# Patient Record
Sex: Male | Born: 1950
Health system: Southern US, Community
[De-identification: ages and names within clinical notes are randomized; demographics above are authoritative.]

## PROBLEM LIST (undated history)

## (undated) DIAGNOSIS — Z972 Presence of dental prosthetic device (complete) (partial): Secondary | ICD-10-CM

## (undated) DIAGNOSIS — I493 Ventricular premature depolarization: Secondary | ICD-10-CM

## (undated) DIAGNOSIS — A549 Gonococcal infection, unspecified: Secondary | ICD-10-CM

## (undated) DIAGNOSIS — R0609 Other forms of dyspnea: Secondary | ICD-10-CM

## (undated) DIAGNOSIS — K4090 Unilateral inguinal hernia, without obstruction or gangrene, not specified as recurrent: Secondary | ICD-10-CM

## (undated) DIAGNOSIS — J189 Pneumonia, unspecified organism: Secondary | ICD-10-CM

## (undated) DIAGNOSIS — I77810 Thoracic aortic ectasia: Secondary | ICD-10-CM

## (undated) DIAGNOSIS — R0600 Dyspnea, unspecified: Secondary | ICD-10-CM

## (undated) DIAGNOSIS — H409 Unspecified glaucoma: Secondary | ICD-10-CM

## (undated) HISTORY — PX: HIATAL HERNIA REPAIR: SHX195

## (undated) HISTORY — DX: Presence of dental prosthetic device (complete) (partial): Z97.2

## (undated) HISTORY — DX: Ventricular premature depolarization: I49.3

## (undated) HISTORY — DX: Unspecified glaucoma: H40.9

## (undated) HISTORY — DX: Other forms of dyspnea: R06.09

## (undated) HISTORY — DX: Unilateral inguinal hernia, without obstruction or gangrene, not specified as recurrent: K40.90

## (undated) HISTORY — DX: Pneumonia, unspecified organism: J18.9

## (undated) HISTORY — DX: Dyspnea, unspecified: R06.00

## (undated) HISTORY — DX: Gonococcal infection, unspecified: A54.9

---
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
## 1999-01-20 ENCOUNTER — Encounter: Payer: Self-pay | Admitting: Emergency Medicine

## 1999-01-20 ENCOUNTER — Emergency Department (HOSPITAL_COMMUNITY): Admission: EM | Admit: 1999-01-20 | Discharge: 1999-01-20 | Payer: Self-pay | Admitting: Emergency Medicine

## 2012-11-14 ENCOUNTER — Ambulatory Visit (INDEPENDENT_AMBULATORY_CARE_PROVIDER_SITE_OTHER): Payer: BC Managed Care – PPO | Admitting: General Surgery

## 2012-11-23 ENCOUNTER — Encounter (INDEPENDENT_AMBULATORY_CARE_PROVIDER_SITE_OTHER): Payer: Self-pay

## 2012-11-24 ENCOUNTER — Encounter (INDEPENDENT_AMBULATORY_CARE_PROVIDER_SITE_OTHER): Payer: Self-pay | Admitting: General Surgery

## 2012-11-24 ENCOUNTER — Ambulatory Visit (INDEPENDENT_AMBULATORY_CARE_PROVIDER_SITE_OTHER): Payer: BC Managed Care – PPO | Admitting: General Surgery

## 2012-11-24 VITALS — BP 122/70 | HR 84 | Resp 16 | Ht 75.0 in | Wt 184.4 lb

## 2012-11-24 DIAGNOSIS — K4090 Unilateral inguinal hernia, without obstruction or gangrene, not specified as recurrent: Secondary | ICD-10-CM | POA: Insufficient documentation

## 2012-11-24 NOTE — Patient Instructions (Signed)
Call [phone] when you would like to schedule your hernia repair.    CC[facility] _______[facility]   INGUINAL HERNIA REPAIR: POST OP INSTRUCTIONS  Always review your discharge instruction sheet given to you by the facility where your surgery was performed. IF YOU HAVE DISABILITY OR FAMILY LEAVE FORMS, YOU MUST BRING THEM TO THE OFFICE FOR PROCESSING.   DO NOT GIVE THEM TO YOUR DOCTOR.  1. A  prescription for pain medication may be given to you upon discharge.  Take your pain medication as prescribed, if needed.  If narcotic pain medicine is not needed, then you may take acetaminophen (Tylenol) or ibuprofen (Advil) as needed. 2. Take your usually prescribed medications unless otherwise directed. 3. If you need a refill on your pain medication, please contact your pharmacy.  They will contact our office to request authorization. Prescriptions will not be filled after 5 pm or on week-ends. 4. You should follow a light diet the first 24 hours after arrival home, such as soup and crackers, etc.  Be sure to include lots of fluids daily.  Resume your normal diet the day after surgery. 5. Most patients will experience some swelling and bruising  in the groin and scrotum.  Ice packs and reclining will help.  Swelling and bruising can take several days to resolve.  6. It is common to experience some constipation if taking pain medication after surgery.  Increasing fluid intake and taking a stool softener (such as Colace) will usually help or prevent this problem from occurring.  A mild laxative (Milk of Magnesia or Miralax) should be taken according to package directions if there are no bowel movements after 48 hours. 7. Unless discharge instructions indicate otherwise, you may remove your bandages 24-48 hours after surgery, and you may shower at that time.  You may have steri-strips (small skin tapes) in place directly over the incision.  These strips should be left on the skin for 7-10 days.  If  your surgeon used skin glue on the incision, you may shower in 24 hours.  The glue will flake off over the next 2-3 weeks.  Any sutures or staples will be removed at the office during your follow-up visit. 8. ACTIVITIES:  You may resume regular (light) daily activities beginning the next day-such as daily self-care, walking, climbing stairs-gradually increasing activities as tolerated.  You may have sexual intercourse when it is comfortable.  Refrain from any heavy lifting or straining until approved by your doctor. a. You may drive when you are no longer taking prescription pain medication, you can comfortably wear a seatbelt, and you can safely maneuver your car and apply brakes. b. RETURN TO WORK:  __________________________________________________________ 9. You should see your doctor in the office for a follow-up appointment approximately 2-3 weeks after your surgery.  Make sure that you call for this appointment within a day or two after you arrive home to insure a convenient appointment time. 10. OTHER INSTRUCTIONS:  __________________________________________________________________________________________________________________________________________________________________________________________  WHEN TO CALL YOUR DOCTOR: 1. Fever over 101.0 2. Inability to urinate 3. Nausea and/or vomiting 4. Extreme swelling or bruising 5. Continued bleeding from incision. 6. Increased pain, redness, or drainage from the incision  The clinic staff is available to answer your questions during regular business hours.  Please don't hesitate to call and ask to speak to one of the nurses for clinical concerns.  If you have a medical emergency, go to the nearest emergency room or call 911.  A Careers adviser from [facility] is always on call at the hospital   [address], [place], [place]  [address] ?  [address] [address], [place], [place]   [address] [phone] ? [phone] ? FAX  [phone] Web site: [email or web address]

## 2012-11-24 NOTE — Progress Notes (Signed)
Patient ID: Joe Cantu, male   DOB: 01/19/1956, 62 y.o.   MRN: [id number]  No chief complaint on file.   HPI Joe Cantu is a 62 y.o. male.   HPI  He is referred by Dr. Loleta Chance for evaluation of a right inguinal hernia. This has been long-standing. He has had a previous left inguinal hernia repair in the past. He has no obstructive symptoms. The hernia is reduced when he gets up in the morning. No difficulty with urination. No problems with constipation. He does do heavy work.  Past Medical History  Diagnosis Date  . Gonorrhea     18 & 19 yrs of age; negative for syphillis, herpes and HIV  . Left inguinal hernia   . Pneumonia   . Wears dentures     Past Surgical History  Procedure Laterality Date  . Hiatal hernia repair      Family History  Problem Relation Age of Onset  . Diabetes Mother   . Cancer Father     colon  . Diabetes Sister   . Diabetes Brother     Social History History  Substance Use Topics  . Smoking status: Never Smoker   . Smokeless tobacco: Not on file  . Alcohol Use: Yes    Not on File  Current Outpatient Prescriptions  Medication Sig Dispense Refill  . aspirin 81 MG tablet Take 81 mg by mouth as needed for pain.      . Aspirin-Salicylamide-Caffeine (BC HEADACHE POWDER PO) Take by mouth as needed.       No current facility-administered medications for this visit.    Review of Systems Review of Systems  Constitutional: Negative.   HENT: Negative.   Respiratory: Negative.   Cardiovascular: Negative.   Gastrointestinal: Negative.   Genitourinary: Negative.   Neurological: Negative.   Hematological: Negative.     Blood pressure 122/70, pulse 84, resp. rate 16, height 6\' 3"  (1.905 m), weight 184 lb 6.4 oz (83.643 kg).  Physical Exam Physical Exam  Constitutional: He appears well-developed and well-nourished. No distress.  HENT:  Head: Normocephalic.  Neck: Neck supple.  Cardiovascular: Normal rate and regular rhythm.    Pulmonary/Chest: Effort normal and breath sounds normal.  Abdominal: Soft. He exhibits no distension and no mass. There is no tenderness.  Genitourinary:  Left inguinal scar. No bulging.  Moderate to large sized right inguinal bulge that is reducible in the supine position.  Musculoskeletal: He exhibits no edema.  Lymphadenopathy:    He has no cervical adenopathy.  Skin: Skin is warm and dry.    Data Reviewed none  Assessment    Moderate to large right inguinal hernia. I have recommended repair and he is in agreement. We discussed open and laparoscopic techniques with mesh and he has chosen open technique. We also discussed placing an On-Q pain pump.     Plan    Open right inguinal hernia repair with mesh and placement of On-Q pump.   I have explained the procedure, risks, and aftercare of inguinal hernia repair.  Risks include but are not limited to bleeding, infection, wound problems, anesthesia, recurrence, bladder or intestine injury, urinary retention, testicular dysfunction, chronic pain, mesh problems.  He seems to understand and agrees to proceed.       Joe Cantu J 11/24/2012, 2:06 PM

## 2012-12-02 ENCOUNTER — Ambulatory Visit: Payer: Self-pay | Admitting: Internal Medicine

## 2013-01-02 ENCOUNTER — Encounter (INDEPENDENT_AMBULATORY_CARE_PROVIDER_SITE_OTHER): Payer: Self-pay | Admitting: General Surgery

## 2013-01-02 NOTE — Progress Notes (Signed)
Patient ID: Joe Cantu, male   DOB: May 31, 1951, 62 y.o.   MRN: [id number] He called and is interested in scheduling his right inguinal hernia. We will work on this for him.

## 2013-01-12 ENCOUNTER — Telehealth (INDEPENDENT_AMBULATORY_CARE_PROVIDER_SITE_OTHER): Payer: Self-pay | Admitting: General Surgery

## 2013-01-12 NOTE — Telephone Encounter (Signed)
01/03/13 Surgery Scheduler contacted patient to review financial responsibility.  Pt advised he will call back to schedule surgery.

## 2013-01-12 NOTE — Telephone Encounter (Signed)
Noted  

## 2013-01-24 ENCOUNTER — Telehealth (INDEPENDENT_AMBULATORY_CARE_PROVIDER_SITE_OTHER): Payer: Self-pay | Admitting: General Surgery

## 2013-01-24 ENCOUNTER — Ambulatory Visit: Payer: Self-pay | Attending: Family Medicine

## 2013-01-24 NOTE — Telephone Encounter (Signed)
Noted  

## 2013-01-24 NOTE — Telephone Encounter (Signed)
Advise patient of financial responsible, patient will call bact to schedule

## 2013-02-28 ENCOUNTER — Emergency Department (HOSPITAL_COMMUNITY)
Admission: EM | Admit: 2013-02-28 | Discharge: 2013-03-01 | Disposition: A | Discharge status: Home / Self Care | Payer: BC Managed Care – PPO | Attending: Emergency Medicine | Admitting: Emergency Medicine

## 2013-02-28 ENCOUNTER — Encounter (HOSPITAL_COMMUNITY): Payer: Self-pay | Admitting: *Deleted

## 2013-02-28 DIAGNOSIS — Z8701 Personal history of pneumonia (recurrent): Secondary | ICD-10-CM | POA: Insufficient documentation

## 2013-02-28 DIAGNOSIS — K4090 Unilateral inguinal hernia, without obstruction or gangrene, not specified as recurrent: Secondary | ICD-10-CM

## 2013-02-28 DIAGNOSIS — Z8619 Personal history of other infectious and parasitic diseases: Secondary | ICD-10-CM | POA: Insufficient documentation

## 2013-02-28 DIAGNOSIS — K4091 Unilateral inguinal hernia, without obstruction or gangrene, recurrent: Secondary | ICD-10-CM | POA: Insufficient documentation

## 2013-02-28 LAB — CBC WITH DIFFERENTIAL/PLATELET
Basophils Absolute: 0 10*3/uL (ref 0.0–0.1)
HCT: 38.6 % — ABNORMAL LOW (ref 39.0–52.0)
Lymphocytes Relative: 33 % (ref 12–46)
Neutro Abs: 3 10*3/uL (ref 1.7–7.7)
Platelets: 237 10*3/uL (ref 150–400)
RDW: 13.4 % (ref 11.5–15.5)
WBC: 5.2 10*3/uL (ref 4.0–10.5)

## 2013-02-28 LAB — BASIC METABOLIC PANEL
BUN: 13 mg/dL (ref 6–23)
CO2: 25 mEq/L (ref 19–32)
Calcium: 10.6 mg/dL — ABNORMAL HIGH (ref 8.4–10.5)
Chloride: 105 mEq/L (ref 96–112)
Creatinine, Ser: 0.67 mg/dL (ref 0.50–1.35)
GFR calc non Af Amer: >90 mL/min (ref >90)
Glucose, Bld: 96 mg/dL (ref 70–99)
Potassium: 4.2 mEq/L (ref 3.5–5.1)
Sodium: 139 mEq/L (ref 135–145)

## 2013-02-28 LAB — LACTIC ACID, PLASMA: Lactic Acid, Venous: 0.8 mmol/L (ref 0.5–2.2)

## 2013-02-28 MED ORDER — HYDROMORPHONE HCL PF 1 MG/ML IJ SOLN
1.0000 mg | Freq: Once | INTRAMUSCULAR | Status: AC
  Administered 2013-02-28: 1 mg via INTRAVENOUS
  Filled 2013-02-28: qty 1

## 2013-02-28 MED ORDER — ONDANSETRON HCL 4 MG/2ML IJ SOLN
4.0000 mg | Freq: Once | INTRAMUSCULAR | Status: AC
  Administered 2013-02-28: 4 mg via INTRAVENOUS
  Filled 2013-02-28: qty 2

## 2013-02-28 NOTE — ED Notes (Signed)
Pt states that he was changing his wheels and his right side inguinal hernia swelled up. Pt states that he stool now looks bloody.

## 2013-02-28 NOTE — ED Provider Notes (Signed)
CSN: [id number]     Arrival date & time 02/28/13  1925 History     First MD Initiated Contact with Patient 02/28/13 2159     Chief Complaint  Patient presents with  . Hernia   (Consider location/radiation/quality/duration/timing/severity/associated sxs/prior Treatment) Patient is a 62 y.o. male presenting with groin pain.  Groin Pain This is a new problem. The current episode started today. The problem has been gradually worsening. Associated symptoms include abdominal pain. The symptoms are aggravated by exertion. He has tried rest for the symptoms. The treatment provided mild relief.    Past Medical History  Diagnosis Date  . Gonorrhea     18 & 19 yrs of age; negative for syphillis, herpes and HIV  . Left inguinal hernia   . Pneumonia   . Wears dentures    Past Surgical History  Procedure Laterality Date  . Hiatal hernia repair     Family History  Problem Relation Age of Onset  . Diabetes Mother   . Cancer Father     colon  . Diabetes Sister   . Diabetes Brother    History  Substance Use Topics  . Smoking status: Never Smoker   . Smokeless tobacco: Not on file  . Alcohol Use: Yes    Review of Systems  Gastrointestinal: Positive for abdominal pain.  All other systems reviewed and are negative.    Allergies  Review of patient's allergies indicates no known allergies.  Home Medications   Current Outpatient Rx  Name  Route  Sig  Dispense  Refill  . acetaminophen (TYLENOL) 500 MG tablet   Oral   Take 1,000 mg by mouth every 6 (six) hours as needed for pain.         Marland Kitchen aspirin 81 MG tablet   Oral   Take 81 mg by mouth as needed for pain.         . Aspirin-Salicylamide-Caffeine (BC HEADACHE POWDER PO)   Oral   Take 1 packet by mouth daily as needed (headache).           BP 140/96  Pulse 71  Temp(Src) 97.8 F (36.6 C) (Oral)  Resp 16  SpO2 97% Physical Exam  Nursing note and vitals reviewed. Constitutional: He is oriented to person, place,  and time. He appears well-developed and well-nourished.  HENT:  Head: Normocephalic and atraumatic.  Eyes: Conjunctivae are normal. Pupils are equal, round, and reactive to light.  Neck: Normal range of motion. Neck supple.  Cardiovascular: Normal rate, regular rhythm and normal heart sounds.   Pulmonary/Chest: Effort normal and breath sounds normal.  Abdominal: Soft. He exhibits no distension. There is tenderness (mild right pelvic at inguinal surgery). There is no rebound and no guarding. A hernia is present. Hernia confirmed positive in the right inguinal area.  Musculoskeletal: Normal range of motion.  Neurological: He is alert and oriented to person, place, and time.  Skin: Skin is warm and dry.  Psychiatric: He has a normal mood and affect. His behavior is normal. Judgment and thought content normal.    ED Course   Procedures (including critical care time)  Labs Reviewed  CBC WITH DIFFERENTIAL - Abnormal; Notable for the following:    HCT 38.6 (*)    MCHC 36.3 (*)    All other components within normal limits  BASIC METABOLIC PANEL - Abnormal; Notable for the following:    Calcium 10.6 (*)    All other components within normal limits  LACTIC ACID, PLASMA  No results found. No diagnosis found. Patient seen by Dr. Jodi Mourning, who performed manual reduction of right inguinal hernia.  Pain improved after procedure.  Return precautions discussed.  Follow-up with general surgery.  MDM    Jimmye Norman, NP 03/01/13 [phone]  Medical screening examination/treatment/procedure(s) were conducted as a shared visit with non-physician practitioner(s) or resident and myself. I personally evaluated the patient during the encounter and agree with the findings and plan unless otherwise indicated.  Right inguinal hernia new since around 5 pm with lifting. Right inguinal hernia mild to moderate size without discoloration, soft, mild pain to palpation, abd soft/ nt. Discussed reduction. Pain  medicines given. Constant pressure reduced after two attempts. With mild delay in seeking care will discuss with surgery whether CT indicated. Surgery paged, no response.  Pt pain resolved, pt prefers to see surgery in clinic in next two days, strict reasons to return given.  PROCEDURE  RIght inguinal hernia reduction  Discussed plan with patient.  Hernia reduced with constant pressure.  Pain improved afterwards.  Plan to discuss with surgery for close fup outpt.   Right inguinal hernia and reduction without clinical signs of strangulation/ incarceration    Enid Skeens, MD 03/01/13 1216

## 2013-03-01 MED ORDER — OXYCODONE-ACETAMINOPHEN 5-325 MG PO TABS: 1.0000 | ORAL_TABLET | ORAL | Status: DC | PRN

## 2013-03-09 ENCOUNTER — Ambulatory Visit (INDEPENDENT_AMBULATORY_CARE_PROVIDER_SITE_OTHER): Payer: BC Managed Care – PPO | Admitting: General Surgery

## 2013-03-09 ENCOUNTER — Encounter (INDEPENDENT_AMBULATORY_CARE_PROVIDER_SITE_OTHER): Payer: Self-pay | Admitting: General Surgery

## 2013-03-09 VITALS — BP 120/74 | HR 64 | Temp 98.7°F | Resp 14 | Ht 75.0 in | Wt 177.2 lb

## 2013-03-09 DIAGNOSIS — K4090 Unilateral inguinal hernia, without obstruction or gangrene, not specified as recurrent: Secondary | ICD-10-CM

## 2013-03-09 NOTE — Progress Notes (Signed)
Subjective:     Patient ID: Joe Cantu, male   DOB: Apr 18, 1951, 62 y.o.   MRN: [id number]  HPI  I saw him in May and we discussed right inguinal hernia repair with mesh.  He was not ready to schedule the surgery at that time but he is now and presents back to discuss the surgery.  He does not think the hernia has become any larger.   Review of Systems  No difficulty with urination or constipation.     Objective:   Physical Exam Gen.-he looks well and is in no acute distress GU-left inguinal scars present. Moderate size reducible right inguinal bulge is present.    Assessment:     Right inguinal hernia.     Plan:     Right inguinal hernia repair with mesh. The procedure risks and after care were discussed with him again. He will call back when he wants to schedule.

## 2013-03-09 NOTE — Patient Instructions (Signed)
Call [phone] when you want to schedule surgery.  Stop your Aspirin 5 days before the surgery.    [facility] _______[facility]  UMBILICAL OR INGUINAL HERNIA REPAIR: POST OP INSTRUCTIONS  Always review your discharge instruction sheet given to you by the facility where your surgery was performed. IF YOU HAVE DISABILITY OR FAMILY LEAVE FORMS, YOU MUST BRING THEM TO THE OFFICE FOR PROCESSING.   DO NOT GIVE THEM TO YOUR DOCTOR.  1. A  prescription for pain medication may be given to you upon discharge.  Take your pain medication as prescribed, if needed.  If narcotic pain medicine is not needed, then you may take acetaminophen (Tylenol) or ibuprofen (Advil) as needed. 2. Take your usually prescribed medications unless otherwise directed. 3. If you need a refill on your pain medication, please contact your pharmacy.  They will contact our office to request authorization. Prescriptions will not be filled after 5 pm or on week-ends. 4. You should follow a light diet the first 24 hours after arrival home, such as soup and crackers, etc.  Be sure to include lots of fluids daily.  Resume your normal diet the day after surgery. 5. Most patients will experience some swelling and bruising around the umbilicus or in the groin and scrotum.  Ice packs and reclining will help.  Swelling and bruising can take several days to resolve.  6. It is common to experience some constipation if taking pain medication after surgery.  Increasing fluid intake and taking a stool softener (such as Colace) will usually help or prevent this problem from occurring.  A mild laxative (Milk of Magnesia or Miralax) should be taken according to package directions if there are no bowel movements after 48 hours. 7. Unless discharge instructions indicate otherwise, you may remove your bandages 24-48 hours after surgery, and you may shower at that time.  You may have steri-strips (small skin tapes) in place directly over the  incision.  These strips should be left on the skin for 7-10 days.  If your surgeon used skin glue on the incision, you may shower in 24 hours.  The glue will flake off over the next 2-3 weeks.  Any sutures or staples will be removed at the office during your follow-up visit. 8. ACTIVITIES:  You may resume regular (light) daily activities beginning the next day-such as daily self-care, walking, climbing stairs-gradually increasing activities as tolerated.  You may have sexual intercourse when it is comfortable.  Refrain from any heavy lifting or straining until approved by your doctor. a. You may drive when you are no longer taking prescription pain medication, you can comfortably wear a seatbelt, and you can safely maneuver your car and apply brakes. b. RETURN TO WORK:  __________________________________________________________ 9. You should see your doctor in the office for a follow-up appointment approximately 2-3 weeks after your surgery.  Make sure that you call for this appointment within a day or two after you arrive home to insure a convenient appointment time. 10. OTHER INSTRUCTIONS:  __________________________________________________________________________________________________________________________________________________________________________________________  WHEN TO CALL YOUR DOCTOR: 1. Fever over 101.0 2. Inability to urinate 3. Nausea and/or vomiting 4. Extreme swelling or bruising 5. Continued bleeding from incision. 6. Increased pain, redness, or drainage from the incision  The clinic staff is available to answer your questions during regular business hours.  Please don't hesitate to call and ask to speak to one of the nurses for clinical concerns.  If you have a medical emergency, go to the nearest emergency  room or call 911.  A surgeon from [facility] is always on call at the hospital   [address], [place], [place]  [address] ?  [address]  [address], [place], [place]   [address] [phone] ? [phone] ? FAX [phone] Web site: [email or web address]

## 2013-06-19 ENCOUNTER — Other Ambulatory Visit (INDEPENDENT_AMBULATORY_CARE_PROVIDER_SITE_OTHER): Payer: Self-pay | Admitting: *Deleted

## 2013-06-19 DIAGNOSIS — K4090 Unilateral inguinal hernia, without obstruction or gangrene, not specified as recurrent: Secondary | ICD-10-CM

## 2013-06-19 MED ORDER — OXYCODONE HCL 5 MG PO TABS: 5.0000 mg | ORAL_TABLET | ORAL | Status: DC | PRN

## 2013-07-19 ENCOUNTER — Encounter (INDEPENDENT_AMBULATORY_CARE_PROVIDER_SITE_OTHER): Payer: Self-pay | Admitting: General Surgery

## 2013-07-19 ENCOUNTER — Ambulatory Visit (INDEPENDENT_AMBULATORY_CARE_PROVIDER_SITE_OTHER): Payer: BC Managed Care – PPO | Admitting: General Surgery

## 2013-07-19 VITALS — BP 142/76 | HR 78 | Resp 16 | Ht 75.0 in | Wt 185.4 lb

## 2013-07-19 DIAGNOSIS — Z4889 Encounter for other specified surgical aftercare: Secondary | ICD-10-CM

## 2013-07-19 NOTE — Patient Instructions (Signed)
May resume normal activities including work as tolerated on January 19

## 2013-07-19 NOTE — Progress Notes (Signed)
He presents for postop followup after open Right inguinal hernia repair with mesh.  Post op pain is improving.  No difficulty voiding or having BMs.  Swelling is decreasing.  P.E.  GU:  Right groin incision clean/dry/intact, swelling is minimal, repair is solid.  Assessment:  Doing well post hernia repair.  Plan:  Continue light activities for 6 weeks postop then slowly start to resume normal activities. Return visit as needed.

## 2018-07-13 HISTORY — PX: COLONOSCOPY: SHX174

## 2018-09-07 ENCOUNTER — Ambulatory Visit: Payer: BC Managed Care – PPO | Admitting: Cardiology

## 2018-09-07 ENCOUNTER — Encounter: Payer: Self-pay | Admitting: Cardiology

## 2018-09-07 VITALS — BP 123/79 | HR 71 | Ht 75.0 in | Wt 178.2 lb

## 2018-09-07 DIAGNOSIS — R9431 Abnormal electrocardiogram [ECG] [EKG]: Secondary | ICD-10-CM

## 2018-09-07 DIAGNOSIS — I493 Ventricular premature depolarization: Secondary | ICD-10-CM | POA: Diagnosis not present

## 2018-09-07 DIAGNOSIS — I77810 Thoracic aortic ectasia: Secondary | ICD-10-CM | POA: Diagnosis not present

## 2018-09-07 DIAGNOSIS — R0600 Dyspnea, unspecified: Secondary | ICD-10-CM

## 2018-09-07 DIAGNOSIS — R0609 Other forms of dyspnea: Secondary | ICD-10-CM

## 2018-09-07 MED ORDER — METOPROLOL TARTRATE 25 MG PO TABS: 25.0000 mg | ORAL_TABLET | Freq: Two times a day (BID) | ORAL | 3 refills | Status: DC

## 2018-09-07 NOTE — Progress Notes (Signed)
Subjective:   Joe Cantu, male    DOB: Aug 23, 1950, 68 y.o.   MRN: [id number]  Joe Mires, MD:  Chief Complaint  Patient presents with  . Shortness of Breath  . Abnormal ECG    HPI: Joe Cantu  is a 68 y.o. male  with remote history of tobacco use,  abnormal EKG and PVCs. There is no past medical history of hypertension, hyperlipidemia or diabetes mellitus. There is no family history of sudden cardiac death.  He has worsening dyspnea on exertion over the past 3 months. He is also noticed frequency of urination over the past several months, was started on tamsulosin 2 months ago but has not noticed any relief of frequency. He is trying to see his urologist soon. No change in symptoms. No dizziness or syncope.   Past Medical History:  Diagnosis Date  . Dyspnea on exertion   . Glaucoma   . Gonorrhea    18 & 19 yrs of age; negative for syphillis, herpes and HIV  . Left inguinal hernia   . Pneumonia   . Ventricular premature beats   . Wears dentures     Past Surgical History:  Procedure Laterality Date  . COLONOSCOPY  07/2018   Benign polyps   . HIATAL HERNIA REPAIR      Family History  Problem Relation Age of Onset  . Diabetes Mother   . Cancer Father        colon  . Diabetes Sister   . Diabetes Brother     Social History   Socioeconomic History  . Marital status: Single    Spouse name: Not on file  . Number of children: 6  . Years of education: Not on file  . Highest education level: Not on file  Occupational History  . Not on file  Social Needs  . Financial resource strain: Not on file  . Food insecurity:    Worry: Not on file    Inability: Not on file  . Transportation needs:    Medical: Not on file    Non-medical: Not on file  Tobacco Use  . Smoking status: Former Smoker    Packs/day: 0.50    Years: 10.00    Pack years: 5.00    Types: Cigarettes  . Smokeless tobacco: Never Used  Substance and Sexual Activity  . Alcohol use: Yes    Comment: occasional   . Drug use: No  . Sexual activity: Not on file  Lifestyle  . Physical activity:    Days per week: Not on file    Minutes per session: Not on file  . Stress: Not on file  Relationships  . Social connections:    Talks on phone: Not on file    Gets together: Not on file    Attends religious service: Not on file    Active member of club or organization: Not on file    Attends meetings of clubs or organizations: Not on file    Relationship status: Not on file  . Intimate partner violence:    Fear of current or ex partner: Not on file    Emotionally abused: Not on file    Physically abused: Not on file    Forced sexual activity: Not on file  Other Topics Concern  . Not on file  Social History Narrative  . Not on file    Current Meds  Medication Sig  . acetaminophen (TYLENOL) 500 MG tablet Take 1,000 mg  by mouth every 6 (six) hours as needed for pain.  Marland Kitchen aspirin 81 MG tablet Take 81 mg by mouth as needed for pain.  Marland Kitchen Dextromethorphan-guaiFENesin (DELSYM COUGH/CHEST CONGEST DM PO) Take 1 Dose by mouth as needed. 20 mL as needed for cough and upper respiratory infection.  . dorzolamide-timolol (COSOPT) 22.3-6.8 MG/ML ophthalmic solution Place 1 drop into both eyes 2 (two) times daily.  Marland Kitchen guaiFENesin-codeine (ROBITUSSIN AC) 100-10 MG/5ML syrup Take 5 mLs by mouth 3 (three) times daily as needed for cough.  . Latanoprostene Bunod (VYZULTA) 0.024 % SOLN Apply 1 drop to eye daily.   Review of Systems  Constitution: Negative for chills, decreased appetite, malaise/fatigue and weight gain.  Cardiovascular: Positive for dyspnea on exertion. Negative for leg swelling and syncope.  Respiratory: Negative for sputum production and wheezing.   Endocrine: Negative for cold intolerance.  Hematologic/Lymphatic: Does not bruise/bleed easily.  Musculoskeletal: Negative for joint swelling.  Gastrointestinal: Negative for abdominal pain, anorexia and change in bowel habit.    Genitourinary: Positive for frequency.  Neurological: Negative for headaches and light-headedness.  Psychiatric/Behavioral: Negative for depression and substance abuse.  All other systems reviewed and are negative.     Objective:     Blood pressure 123/79, pulse 71, height 6\' 3"  (1.905 m), weight 178 lb 3.2 oz (80.8 kg), SpO2 97 %.  Echocardiogram 08/09/2018: Left ventricle cavity is normal in size. Mild concentric hypertrophy of the left ventricle. Normal global wall motion. Normal diastolic filling pattern. Calculated EF 56%. The aortic root is mildly dilated at 4.0 cm. Trileaflet aortic valve with mild (Grade I) regurgitation. Mild (Grade I) mitral regurgitation. Mild tricuspid regurgitation. No evidence of pulmonary hypertension.  Lexiscan Sestamibi stress test 08/08/2018: 1. Lexiscan stress test was performed. Exercise capacity was not assessed. No stress symptoms reported. Resting blood pressure was 142/82 mmHg and peak effect blood pressure was 108/56 mmHg. The resting and stress electrocardiogram demonstrated normal sinus rhythm, normal resting conduction, frequent PVC's and normal rest repolarization. Stress EKG is non diagnostic for ischemia as it is a pharmacologic stress. 2. Left ventricular cavity is noted to be enlarged on the rest and stress studies. Gated SPECT images mild decrease in myocardial thickening and wall motion. The left ventricular ejection fraction was calculated or visually estimated to be 46%. Small sized, mild intensity, fixed inferior perfusion defect likely related to tissue attenuation artifact. Findings likely represent nonischemic cardiomyopathy. 3. High risk study due to reduced LVEF and frequent PVC's.  Physical Exam  Constitutional: He appears well-developed and well-nourished. No distress.  HENT:  Head: Atraumatic.  Eyes: Conjunctivae are normal.  Neck: Neck supple. No JVD present. No thyromegaly present.  Cardiovascular: Normal rate, regular  rhythm, normal heart sounds and intact distal pulses.  Occasional extrasystoles are present. Exam reveals no gallop.  No murmur heard. Pulmonary/Chest: Effort normal and breath sounds normal.  Abdominal: Soft. Bowel sounds are normal.  Musculoskeletal: Normal range of motion.        General: No edema.  Neurological: He is alert.  Skin: Skin is warm and dry.  Psychiatric: He has a normal mood and affect.      Assessment & Recommendations:   1. Dyspnea on exertion  2. PVC (premature ventricular contraction)  3. Abnormal EKG  4. Mild aortic root dilatation  Recommendation: I reviewed the results of the recently performed echocardiogram and the nuclear stress test with the patient.  Patient stated he could not exercise on the treadmill and hence from cardiac status was performed, patient  had frequent PVCs, LVEF was mildly depressed at 45%.  Suspect the LVEF reduction may be related to difficulty with gating due to frequent PVCs.  However in view of marked dyspnea, mild reduction in LVEF by nuclear stress test, PVCs may be playing a major role in his symptoms.  I'd like to obtain PVC burden for performing 24-hour Holter monitor.  I'll given him a prescription for metoprolol 25 g p.o. b.i.d., would probably uptitrated depending upon his symptoms over the next few months.  I'd like to see him back in 2 months to evaluate his symptoms of dyspnea.  Echocardiogram also reveals mild adequate dilatation at 4 cm.  It may be due to his body habitus however this needs follow-up echo cardiac him probably in a year.  He is presently 68 years of age and has history of tobacco use disorder, he may benefit from abdominal aortic duplex to exclude abdominal he can lose some.  I'll discuss this on his next office visit.  Yates Decamp, MD, [facility] 09/10/2018, 6:32 PM [facility] Pager: [phone] Office: [phone] If no answer Cell [phone]

## 2018-09-08 ENCOUNTER — Ambulatory Visit: Payer: BC Managed Care – PPO

## 2018-09-08 DIAGNOSIS — I493 Ventricular premature depolarization: Secondary | ICD-10-CM

## 2018-09-10 ENCOUNTER — Encounter: Payer: Self-pay | Admitting: Cardiology

## 2018-09-12 DIAGNOSIS — I493 Ventricular premature depolarization: Secondary | ICD-10-CM | POA: Diagnosis not present

## 2018-09-13 ENCOUNTER — Telehealth: Payer: Self-pay

## 2018-09-13 NOTE — Telephone Encounter (Signed)
Joe Cantu called, regarding holter monitor,  Pt had 2 episodes of VT and 18% of the study is pvc's

## 2018-09-13 NOTE — Telephone Encounter (Signed)
Will discuss with patient on OV. I may have to set up  coronary CTA vs coronary angiogram.

## 2018-09-13 NOTE — Telephone Encounter (Signed)
[facility] called regarding Holter monitor

## 2018-09-19 ENCOUNTER — Encounter: Payer: Self-pay | Admitting: Cardiology

## 2018-09-19 DIAGNOSIS — R002 Palpitations: Secondary | ICD-10-CM | POA: Insufficient documentation

## 2018-10-14 ENCOUNTER — Telehealth: Payer: Self-pay

## 2018-10-14 NOTE — Telephone Encounter (Signed)
Pt called concerned that you put him on metoprolol and his pcp put him on valsartan and he was reading that he shouldn't take both; He wants you to be the one to manage his htn so please advise?

## 2018-10-14 NOTE — Telephone Encounter (Signed)
Please take both. I am aware

## 2018-11-09 ENCOUNTER — Other Ambulatory Visit: Payer: Self-pay

## 2018-11-09 ENCOUNTER — Encounter: Payer: Self-pay | Admitting: Cardiology

## 2018-11-09 ENCOUNTER — Ambulatory Visit: Payer: BC Managed Care – PPO | Admitting: Cardiology

## 2018-11-09 VITALS — BP 130/57 | HR 46 | Ht 75.0 in | Wt 178.0 lb

## 2018-11-09 DIAGNOSIS — I493 Ventricular premature depolarization: Secondary | ICD-10-CM | POA: Diagnosis not present

## 2018-11-09 DIAGNOSIS — R9431 Abnormal electrocardiogram [ECG] [EKG]: Secondary | ICD-10-CM

## 2018-11-09 DIAGNOSIS — I10 Essential (primary) hypertension: Secondary | ICD-10-CM | POA: Diagnosis not present

## 2018-11-09 DIAGNOSIS — R0600 Dyspnea, unspecified: Secondary | ICD-10-CM

## 2018-11-09 DIAGNOSIS — R0609 Other forms of dyspnea: Secondary | ICD-10-CM | POA: Diagnosis not present

## 2018-11-09 DIAGNOSIS — I77810 Thoracic aortic ectasia: Secondary | ICD-10-CM

## 2018-11-09 MED ORDER — VALSARTAN-HYDROCHLOROTHIAZIDE 320-25 MG PO TABS: 0.5000 | ORAL_TABLET | Freq: Every day | ORAL | 1 refills | Status: DC

## 2018-11-09 MED ORDER — METOPROLOL TARTRATE 50 MG PO TABS: 50.0000 mg | ORAL_TABLET | Freq: Two times a day (BID) | ORAL | 1 refills | Status: DC

## 2018-11-09 NOTE — Progress Notes (Signed)
Virtual Visit via Video Note: This visit type was conducted due to national recommendations for restrictions regarding the COVID-19 Pandemic (e.g. social distancing).  This format is felt to be most appropriate for this patient at this time.  All issues noted in this document were discussed and addressed.  No physical exam was performed (except for noted visual exam findings with Telehealth visits).  The patient has consented to conduct a Telehealth visit and understands insurance will be billed.   I connected with@, on 11/09/18 at  by a video enabled telemedicine application and verified that I am speaking with the correct person using two identifiers.   I discussed the limitations of evaluation and management by telemedicine and the availability of in person appointments. The patient expressed understanding and agreed to proceed.   I have discussed with patient regarding the safety during COVID Pandemic and steps and precautions to be taken including social distancing, frequent hand wash and use of detergent soap, gels with the patient. I asked the patient to avoid touching mouth, nose, eyes, ears with the hands. I encouraged regular walking around the neighborhood and exercise and regular diet, as long as social distancing can be maintained.   Primary Physician/Referring:  [name], MD  Patient ID: Joe Cantu, male    DOB: [birth year], 68 y.o.   MRN: [id number]  Chief Complaint  Patient presents with  . PVC  . Follow-up    41mo    HPI: Joe Cantu  is a 68 y.o. male  with remote history of tobacco use,  abnormal EKG and PVCs. There is no past medical history of hypertension, hyperlipidemia or diabetes mellitus. There is no family history of sudden cardiac death.   He has worsening dyspnea on exertion over the past 3-4 months. No change in symptoms. No palpitations, dizziness or syncope. No change in dyspnea.   Since I last saw him 2 months ago, he was prescribed valsartan HCT  which he states that made him feel extremely fatigued and weak and his discontinued this.  He has noticed his blood pressure to be up and down many times mostly elevated.   Past Medical History:  Diagnosis Date  . Dyspnea on exertion   . Glaucoma   . Gonorrhea    18 & 19 yrs of age; negative for syphillis, herpes and HIV  . Left inguinal hernia   . Pneumonia   . Ventricular premature beats   . Wears dentures     Past Surgical History:  Procedure Laterality Date  . COLONOSCOPY  07/2018   Benign polyps   . HIATAL HERNIA REPAIR      Social History   Socioeconomic History  . Marital status: Single    Spouse name: Not on file  . Number of children: 6  . Years of education: Not on file  . Highest education level: Not on file  Occupational History  . Not on file  Social Needs  . Financial resource strain: Not on file  . Food insecurity:    Worry: Not on file    Inability: Not on file  . Transportation needs:    Medical: Not on file    Non-medical: Not on file  Tobacco Use  . Smoking status: Former Smoker    Packs/day: 0.50    Years: 10.00    Pack years: 5.00    Types: Cigarettes    Last attempt to quit: 1970    Years since quitting: 50.3  . Smokeless tobacco: Never  Used  Substance and Sexual Activity  . Alcohol use: Yes    Comment: occasional wine  . Drug use: No  . Sexual activity: Not on file  Lifestyle  . Physical activity:    Days per week: Not on file    Minutes per session: Not on file  . Stress: Not on file  Relationships  . Social connections:    Talks on phone: Not on file    Gets together: Not on file    Attends religious service: Not on file    Active member of club or organization: Not on file    Attends meetings of clubs or organizations: Not on file    Relationship status: Not on file  . Intimate partner violence:    Fear of current or ex partner: Not on file    Emotionally abused: Not on file    Physically abused: Not on file    Forced  sexual activity: Not on file  Other Topics Concern  . Not on file  Social History Narrative  . Not on file    Current Outpatient Medications on File Prior to Visit  Medication Sig Dispense Refill  . aspirin 81 MG tablet Take 81 mg by mouth as needed for pain.    . dorzolamide-timolol (COSOPT) 22.3-6.8 MG/ML ophthalmic solution Place 1 drop into both eyes 2 (two) times daily.    . Latanoprostene Bunod (VYZULTA) 0.024 % SOLN Apply 1 drop to eye daily.    . tamsulosin (FLOMAX) 0.4 MG CAPS capsule Take 0.4 mg by mouth at bedtime.     No current facility-administered medications on file prior to visit.     Review of Systems  Constitution: Negative for chills, decreased appetite, malaise/fatigue and weight gain.  Cardiovascular: Positive for dyspnea on exertion (stable). Negative for leg swelling and syncope.  Respiratory: Negative for sputum production and wheezing.   Endocrine: Negative for cold intolerance.  Hematologic/Lymphatic: Does not bruise/bleed easily.  Musculoskeletal: Negative for joint swelling.  Gastrointestinal: Negative for abdominal pain, anorexia and change in bowel habit.  Genitourinary: Positive for frequency.  Neurological: Negative for headaches and light-headedness.  Psychiatric/Behavioral: Negative for depression and substance abuse.  All other systems reviewed and are negative.     Objective  Blood pressure (!) 130/57, pulse (!) 46, height 6\' 3"  (1.905 m), weight 178 lb (80.7 kg). Body mass index is 22.25 kg/m.  Physical exam not performed or limited due to virtual visit. Limited exam revealed patient to be in no acute distress, neck was supple without JVD, respiration was not labored. Please see exam details from prior visit is as below.     Physical Exam  Constitutional: He appears well-developed and well-nourished. No distress.  HENT:  Head: Atraumatic.  Eyes: Conjunctivae are normal.  Neck: Neck supple. No JVD present. No thyromegaly present.   Cardiovascular: Normal rate, regular rhythm, normal heart sounds and intact distal pulses.  Occasional extrasystoles are present. Exam reveals no gallop.  No murmur heard. Pulmonary/Chest: Effort normal and breath sounds normal.  Abdominal: Soft. Bowel sounds are normal.  Musculoskeletal: Normal range of motion.        General: No edema.  Neurological: He is alert.  Skin: Skin is warm and dry.  Psychiatric: He has a normal mood and affect.   Radiology: No results found.  Laboratory examination:    CMP Latest Ref Rng & Units 02/28/2013  Glucose 70 - 99 mg/dL 96  BUN 6 - 23 mg/dL 13  Creatinine 8.82 - 8.00  mg/dL 1.61  Sodium 096 - 045 mEq/L 139  Potassium 3.5 - 5.1 mEq/L 4.2  Chloride 96 - 112 mEq/L 105  CO2 19 - 32 mEq/L 25  Calcium 8.4 - 10.5 mg/dL 10.6(H)   CBC Latest Ref Rng & Units 02/28/2013  WBC 4.0 - 10.5 K/uL 5.2  Hemoglobin 13.0 - 17.0 g/dL 40.9  Hematocrit 81.1 - 52.0 % 38.6(L)  Platelets 150 - 400 K/uL 237   Lipid Panel  No results found for: CHOL, TRIG, HDL, CHOLHDL, VLDL, LDLCALC, LDLDIRECT HEMOGLOBIN A1C No results found for: HGBA1C, MPG TSH No results for input(s): TSH in the last 8760 hours.  Cardiac Studies:    Echocardiogram 08/09/2018: Left ventricle cavity is normal in size. Mild concentric hypertrophy of the left ventricle. Normal global wall motion. Normal diastolic filling pattern. Calculated EF 56%. The aortic root is mildly dilated at 4.0 cm. Trileaflet aortic valve with mild (Grade I) regurgitation. Mild (Grade I) mitral regurgitation. Mild tricuspid regurgitation. No evidence of pulmonary hypertension.  Lexiscan Sestamibi stress test 08/08/2018: 1. Lexiscan stress test was performed. Exercise capacity was not assessed. No stress symptoms reported. Resting blood pressure was 142/82 mmHg and peak effect blood pressure was 108/56 mmHg. The resting and stress electrocardiogram demonstrated normal sinus rhythm, normal resting conduction, frequent  PVC's and normal rest repolarization. Stress EKG is non diagnostic for ischemia as it is a pharmacologic stress. 2. Left ventricular cavity is noted to be enlarged on the rest and stress studies.  Gated SPECT images mild decrease in myocardial thickening and wall motion.  The left ventricular ejection fraction was calculated or visually estimated to be 46%.  Small sized, mild intensity, fixed inferior perfusion defect likely related to tissue attenuation artifact. Findings likely represent nonischemic cardiomyopathy. 3. High risk study due to reduced LVEF and frequent PVC's.  Assessment   Dyspnea on exertion  PVC (premature ventricular contraction) - Plan: metoprolol tartrate (LOPRESSOR) 50 MG tablet  Abnormal EKG  Aortic root dilatation (HCC)  EKG 08/01/2018: Normal sinus rhythm/sinus bradycardia at rate of 49 bpm, left atrial enlargement, leftward axis.  LVH with repolarization abnormality, cannot exclude inferior and lateral ischemia.  PVCs (2).  Abnormal EKG.  Recommendations:  Patient with hypertension, PVC, aortic root dilatation and dyspnea.   Patient was prescribed valsartan HCT by his PCP, felt markedly fatigued and weak and discontinued this after taking it for a week.  In view of frequent PVCs, LVEF was mildly depressed at 45%, the could certainly increase the dose of beta blocker metoprolol from 25 mg to 50 mg b.i.d. or he could have the option of trying one half tablet of valsartan 320/25 mg every morning as his blood pressure has also been elevated many times by his recordings.  I have sent in the metoprolol prescription, patient would like to try half a tablet of valsartan 1st and if he tolerates this then I advised him to continue metoprolol 25 mg p.o. b.i.d.  Otherwise he will increase metoprolol to 50 mg b.i.d. without valsartan HCT.  I'd like to see him back in 2 months to evaluate his symptoms of dyspnea and hypertension follow-up along with repeat EKG for PVCs.   Echocardiogram also reveals mild adequate dilatation at 4 cm.  It may be due to his body habitus however this needs follow-up echo in a year.  He is presently 68 years of age and has history of tobacco use disorder, he may benefit from abdominal aortic duplex to exclude abdominal  I'll discuss this on his  next office visit and due to Covid 19 issues did not order the tests for now.  Yates DecampJay Kiwan Gadsden, MD, [facility] 11/09/2018, 4:13 PM [facility] Pager: [phone] Office: [phone] If no answer Cell [phone]

## 2019-01-16 ENCOUNTER — Ambulatory Visit: Payer: BC Managed Care – PPO | Admitting: Cardiology

## 2019-02-13 ENCOUNTER — Ambulatory Visit: Payer: BC Managed Care – PPO | Admitting: Cardiology

## 2019-02-21 ENCOUNTER — Other Ambulatory Visit: Payer: Self-pay

## 2019-02-21 ENCOUNTER — Encounter

## 2019-02-21 ENCOUNTER — Ambulatory Visit: Payer: BC Managed Care – PPO | Admitting: Cardiology

## 2019-02-21 ENCOUNTER — Encounter: Payer: Self-pay | Admitting: Cardiology

## 2019-02-21 VITALS — BP 115/69 | HR 58 | Temp 96.8°F | Ht 75.0 in | Wt 167.7 lb

## 2019-02-21 DIAGNOSIS — I493 Ventricular premature depolarization: Secondary | ICD-10-CM | POA: Diagnosis not present

## 2019-02-21 DIAGNOSIS — Z136 Encounter for screening for cardiovascular disorders: Secondary | ICD-10-CM | POA: Diagnosis not present

## 2019-02-21 DIAGNOSIS — I77810 Thoracic aortic ectasia: Secondary | ICD-10-CM | POA: Diagnosis not present

## 2019-02-21 HISTORY — DX: Thoracic aortic ectasia: I77.810

## 2019-02-21 HISTORY — DX: Ventricular premature depolarization: I49.3

## 2019-02-21 NOTE — Progress Notes (Signed)
Primary Physician/Referring:  Iona Beard, MD  Patient ID: Joe Cantu, male    DOB: 02/21/1951, 68 y.o.   MRN: [id number]  Chief Complaint  Patient presents with  . Palpitations  . Follow-up    53mo  HPI:    HPI: Joe Cantu is a 68y.o. Caucsian male  with remote history of tobacco use,  abnormal EKG and frequent PVCs. There is no past medical history of hypertension, hyperlipidemia or diabetes mellitus. There is no family history of sudden cardiac death.  He is here on a 673-monthffice visit follow-up of frequent PVCs.  Except for nasal congestion, he has no other specific symptoms today.  He does have mild chronic dyspnea that is unchanged, no change in symptoms over the past 6 months. No dizziness or syncope.  He is tolerating metoprolol without any side effects.  Past Medical History:  Diagnosis Date  . Aortic root dilatation ([place]8/05/2019  . Dyspnea on exertion   . Frequent unifocal PVCs 02/21/2019  . Glaucoma   . Gonorrhea    [age]rs of age; negative for syphillis, herpes and HIV  . Left inguinal hernia   . Pneumonia   . Ventricular premature beats   . Wears dentures    Past Surgical History:  Procedure Laterality Date  . COLONOSCOPY  07/2018   Benign polyps   . HIATAL HERNIA REPAIR     Social History   Socioeconomic History  . Marital status: Single    Spouse name: Not on file  . Number of children: 6  . Years of education: Not on file  . Highest education level: Not on file  Occupational History  . Not on file  Social Needs  . Financial resource strain: Not on file  . Food insecurity    Worry: Not on file    Inability: Not on file  . Transportation needs    Medical: Not on file    Non-medical: Not on file  Tobacco Use  . Smoking status: Former Smoker    Packs/day: 0.50    Years: 10.00    Pack years: 5.00    Types: Cigarettes    Quit date: 1970    Years since quitting: 50.6  . Smokeless tobacco: Never Used  Substance and Sexual  Activity  . Alcohol use: Yes    Comment: occasional wine  . Drug use: No  . Sexual activity: Not on file  Lifestyle  . Physical activity    Days per week: Not on file    Minutes per session: Not on file  . Stress: Not on file  Relationships  . Social coHerbalistn phone: Not on file    Gets together: Not on file    Attends religious service: Not on file    Active member of club or organization: Not on file    Attends meetings of clubs or organizations: Not on file    Relationship status: Not on file  . Intimate partner violence    Fear of current or ex partner: Not on file    Emotionally abused: Not on file    Physically abused: Not on file    Forced sexual activity: Not on file  Other Topics Concern  . Not on file  Social History Narrative  . Not on file   ROS  Review of Systems  Constitution: Negative for chills, decreased appetite, malaise/fatigue and weight gain.  Cardiovascular: Positive for dyspnea on exertion.  Negative for leg swelling and syncope.  Respiratory: Negative for sputum production and wheezing.   Endocrine: Negative for cold intolerance.  Hematologic/Lymphatic: Does not bruise/bleed easily.  Musculoskeletal: Negative for joint swelling.  Gastrointestinal: Negative for abdominal pain, anorexia and change in bowel habit.  Genitourinary: Positive for frequency.  Neurological: Negative for headaches and light-headedness.  Psychiatric/Behavioral: Negative for depression and substance abuse.  All other systems reviewed and are negative.  Objective  Blood pressure 115/69, pulse (!) 58, temperature (!) 96.8 F (36 C), height '6\' 3"'  (1.905 m), weight 167 lb 11.2 oz (76.1 kg), SpO2 98 %. Body mass index is 20.96 kg/m.   Physical Exam  Constitutional: He appears well-developed and well-nourished. No distress.  HENT:  Head: Atraumatic.  Eyes: Conjunctivae are normal.  Neck: Neck supple. No JVD present. No thyromegaly present.  Cardiovascular:  Normal rate, regular rhythm, normal heart sounds and intact distal pulses. Frequent extrasystoles are present. Exam reveals no gallop.  No murmur heard. Pulmonary/Chest: Effort normal and breath sounds normal.  Abdominal: Soft. Bowel sounds are normal.  Musculoskeletal: Normal range of motion.        General: No edema.  Neurological: He is alert.  Skin: Skin is warm and dry.  Psychiatric: He has a normal mood and affect.   Radiology: No results found.  Laboratory examination:   08/01/2018: TSH 1.11  Labs 04/19/2018: Total cholesterol 93, triglycerides 70, HDL 57, LDL 110.  Non-HDL cholesterol 126.   Serum glucose 79 mg, BUN 12, creatinine 0.64, eGFR greater than 60 well.  Potassium 4.1, CMP otherwise normal.   HB 13.6/HCT 39.6, platelets 298.  Normal indicis.  A1c 5.4%, PSA normal.  CMP Latest Ref Rng & Units 02/28/2013  Glucose 70 - 99 mg/dL 96  BUN 6 - 23 mg/dL 13  Creatinine 0.50 - 1.35 mg/dL 0.67  Sodium 135 - 145 mEq/L 139  Potassium 3.5 - 5.1 mEq/L 4.2  Chloride 96 - 112 mEq/L 105  CO2 19 - 32 mEq/L 25  Calcium 8.4 - 10.5 mg/dL 10.6(H)   CBC Latest Ref Rng & Units 02/28/2013  WBC 4.0 - 10.5 K/uL 5.2  Hemoglobin 13.0 - 17.0 g/dL 14.0  Hematocrit 39.0 - 52.0 % 38.6(L)  Platelets 150 - 400 K/uL 237   Lipid Panel  No results found for: CHOL, TRIG, HDL, CHOLHDL, VLDL, LDLCALC, LDLDIRECT HEMOGLOBIN A1C No results found for: HGBA1C, MPG TSH No results for input(s): TSH in the last 8760 hours. Medications   Current Outpatient Medications  Medication Instructions  . aspirin 81 mg, As needed  . dorzolamide-timolol (COSOPT) 22.3-6.8 MG/ML ophthalmic solution 1 drop, Both Eyes, 2 times daily  . Latanoprostene Bunod (VYZULTA) 0.024 % SOLN 1 drop, Ophthalmic, Daily  . metoprolol tartrate (LOPRESSOR) 50 mg, Oral, 2 times daily  . tamsulosin (FLOMAX) 0.4 mg, Oral, Daily at bedtime    Cardiac Studies:   Echocardiogram 08/09/2018: Left ventricle cavity is normal in size.  Mild concentric hypertrophy of the left ventricle. Normal global wall motion. Normal diastolic filling pattern. Calculated EF 56%. The aortic root is mildly dilated at 4.0 cm. Trileaflet aortic valve with mild (Grade I) regurgitation. Mild (Grade I) mitral regurgitation. Mild tricuspid regurgitation. No evidence of pulmonary hypertension.  Lexiscan Sestamibi stress test 08/08/2018: 1. Lexiscan stress test was performed. Exercise capacity was not assessed. No stress symptoms reported. Resting blood pressure was 142/82 mmHg and peak effect blood pressure was 108/56 mmHg. The resting and stress electrocardiogram demonstrated normal sinus rhythm, normal resting conduction, frequent PVC's and  normal rest repolarization. Stress EKG is non diagnostic for ischemia as it is a pharmacologic stress. 2. Left ventricular cavity is noted to be enlarged on the rest and stress studies. Gated SPECT images mild decrease in myocardial thickening and wall motion. The left ventricular ejection fraction was calculated or visually estimated to be 46%. Small sized, mild intensity, fixed inferior perfusion defect likely related to tissue attenuation artifact. Findings likely represent nonischemic cardiomyopathy. 3. High risk study due to reduced LVEF and frequent PVC's.  Holter Monitor for 24 hours  09/08/2018 :  Minimum heart rate 47, maximum heart rate 118 BPM, normal sinus.  PVC but this 18.3%.  20,600 beats PVCs noted 8 wearing couplets 48 wearing triplets and 435 wearing bigeminy, 135 in trigeminy.  Longest run 4 beats.  There were 102 PACs noted.  Assessment     ICD-10-CM   1. PVC (premature ventricular contraction)  I49.3 EKG 12-Lead  2. Aortic root dilatation (HCC)  I77.810 PCV AORTA DUPLEX  3. Screening for abdominal aortic aneurysm  Z13.6 PCV AORTA DUPLEX  4. Frequent unifocal PVCs  I49.3     EKG 02/21/2019: Normal sinus rhythm with rate of 69 bpm, left atrial enlargement, left axis deviation, incomplete  right bundle branch block.  Frequent PVCs in the pattern of ventricular bigeminy.  Nonspecific T abnormality.  Abnormal EKG.  EKG 08/01/2018: Normal sinus rhythm/sinus bradycardia at rate of 49 bpm, left atrial enlargement, leftward axis. LVH with repolarization abnormality, cannot exclude inferior and lateral ischemia. PVCs (2). Abnormal EKG.  Recommendations:   Patient is here on a six-month follow-up visit of frequent PVCs, hypertension and prior tobacco use disorder.  He has essentially remains asymptomatic, he has normal LVEF by echocardiogram, continues to exercise regularly, no clinical evidence of heart failure.  I simply reassured him with regard to PVCs, continue beta blocker therapy for now, I'll like to see him back in 6 months.  In view of mild aortic root dilatation noted echocardiogram, also prior history of tobacco use disorder, I have recommended abdominal aortic duplex for screening AAA.  Adrian Prows, MD, [facility] 02/21/2019, 6:03 PM [facility] [place] Pager: [phone] Office: [phone] If no answer Cell [phone]

## 2019-03-30 ENCOUNTER — Ambulatory Visit (INDEPENDENT_AMBULATORY_CARE_PROVIDER_SITE_OTHER): Payer: BC Managed Care – PPO

## 2019-03-30 ENCOUNTER — Other Ambulatory Visit: Payer: Self-pay

## 2019-03-30 DIAGNOSIS — I77810 Thoracic aortic ectasia: Secondary | ICD-10-CM | POA: Diagnosis not present

## 2019-03-30 DIAGNOSIS — Z136 Encounter for screening for cardiovascular disorders: Secondary | ICD-10-CM

## 2019-04-02 ENCOUNTER — Other Ambulatory Visit: Payer: Self-pay | Admitting: Cardiology

## 2019-04-02 DIAGNOSIS — I714 Abdominal aortic aneurysm, without rupture, unspecified: Secondary | ICD-10-CM

## 2019-04-03 NOTE — Progress Notes (Signed)
Called pt no answer, left a vm to return call back.

## 2019-04-07 ENCOUNTER — Telehealth: Payer: Self-pay

## 2019-04-07 NOTE — Telephone Encounter (Signed)
Pt called and wanted to know if he needs to take any precautions for upcoming dental work?

## 2019-04-07 NOTE — Telephone Encounter (Signed)
S/w patient gave him results of Aorta Duplex.

## 2019-04-09 NOTE — Telephone Encounter (Signed)
Only if patients have valve repair or have congenital complex heart disease, do they need prophylaxis. On certain patients I would have already documented that the patient needs endocarditis prophylaxis.  This patient does not.

## 2019-04-11 ENCOUNTER — Other Ambulatory Visit: Payer: Self-pay

## 2019-04-11 NOTE — Telephone Encounter (Signed)
Patient dentist office called regarding clearance for oral procedure, faxed over information from previous note from provider pertaining dental procedure.

## 2019-04-12 NOTE — Telephone Encounter (Signed)
S/w patient advised him

## 2019-04-17 ENCOUNTER — Encounter: Payer: Self-pay | Admitting: Cardiology

## 2019-04-17 ENCOUNTER — Telehealth: Payer: Self-pay

## 2019-04-17 NOTE — Telephone Encounter (Signed)
Pt's dentist called and sad the patient is requesting a letter from you stating that he is healthy enough to have a full mouth extraction. Patient would like  This letter fax to his dentist Dr. Romie Minus @ [phone] please advise

## 2019-06-12 ENCOUNTER — Other Ambulatory Visit: Payer: Self-pay

## 2019-06-12 DIAGNOSIS — I493 Ventricular premature depolarization: Secondary | ICD-10-CM

## 2019-06-12 MED ORDER — METOPROLOL TARTRATE 50 MG PO TABS: 50.0000 mg | ORAL_TABLET | Freq: Two times a day (BID) | ORAL | 0 refills | Status: DC

## 2019-06-22 ENCOUNTER — Other Ambulatory Visit: Payer: Self-pay | Admitting: Family Medicine

## 2019-06-22 DIAGNOSIS — R001 Bradycardia, unspecified: Secondary | ICD-10-CM

## 2019-06-26 ENCOUNTER — Ambulatory Visit: Payer: BC Managed Care – PPO

## 2019-06-26 ENCOUNTER — Other Ambulatory Visit: Payer: Self-pay

## 2019-06-26 DIAGNOSIS — R001 Bradycardia, unspecified: Secondary | ICD-10-CM

## 2019-06-30 DIAGNOSIS — R001 Bradycardia, unspecified: Secondary | ICD-10-CM | POA: Diagnosis not present

## 2019-08-30 ENCOUNTER — Inpatient Hospital Stay (HOSPITAL_COMMUNITY): Payer: BC Managed Care – PPO

## 2019-08-30 ENCOUNTER — Other Ambulatory Visit: Payer: Self-pay

## 2019-08-30 ENCOUNTER — Ambulatory Visit: Payer: BC Managed Care – PPO | Admitting: Cardiology

## 2019-08-30 ENCOUNTER — Encounter (HOSPITAL_COMMUNITY): Payer: Self-pay | Admitting: Family Medicine

## 2019-08-30 ENCOUNTER — Inpatient Hospital Stay (HOSPITAL_COMMUNITY)
Admission: EM | Admit: 2019-08-30 | Discharge: 2019-09-02 | DRG: 167 | Disposition: A | Discharge status: Home / Self Care | Payer: BC Managed Care – PPO | Attending: Internal Medicine | Admitting: Internal Medicine

## 2019-08-30 ENCOUNTER — Emergency Department (HOSPITAL_COMMUNITY): Payer: BC Managed Care – PPO

## 2019-08-30 DIAGNOSIS — R0602 Shortness of breath: Secondary | ICD-10-CM

## 2019-08-30 DIAGNOSIS — Z79899 Other long term (current) drug therapy: Secondary | ICD-10-CM | POA: Diagnosis not present

## 2019-08-30 DIAGNOSIS — R55 Syncope and collapse: Secondary | ICD-10-CM | POA: Diagnosis present

## 2019-08-30 DIAGNOSIS — Z20822 Contact with and (suspected) exposure to covid-19: Secondary | ICD-10-CM | POA: Diagnosis present

## 2019-08-30 DIAGNOSIS — I959 Hypotension, unspecified: Secondary | ICD-10-CM | POA: Diagnosis present

## 2019-08-30 DIAGNOSIS — Z8701 Personal history of pneumonia (recurrent): Secondary | ICD-10-CM | POA: Diagnosis not present

## 2019-08-30 DIAGNOSIS — I2699 Other pulmonary embolism without acute cor pulmonale: Secondary | ICD-10-CM | POA: Diagnosis present

## 2019-08-30 DIAGNOSIS — I82811 Embolism and thrombosis of superficial veins of right lower extremities: Secondary | ICD-10-CM | POA: Diagnosis present

## 2019-08-30 DIAGNOSIS — I493 Ventricular premature depolarization: Secondary | ICD-10-CM | POA: Diagnosis present

## 2019-08-30 DIAGNOSIS — Z7982 Long term (current) use of aspirin: Secondary | ICD-10-CM

## 2019-08-30 DIAGNOSIS — I82432 Acute embolism and thrombosis of left popliteal vein: Secondary | ICD-10-CM | POA: Diagnosis present

## 2019-08-30 DIAGNOSIS — I77819 Aortic ectasia, unspecified site: Secondary | ICD-10-CM | POA: Diagnosis present

## 2019-08-30 DIAGNOSIS — I2609 Other pulmonary embolism with acute cor pulmonale: Secondary | ICD-10-CM

## 2019-08-30 DIAGNOSIS — Z972 Presence of dental prosthetic device (complete) (partial): Secondary | ICD-10-CM

## 2019-08-30 DIAGNOSIS — H409 Unspecified glaucoma: Secondary | ICD-10-CM | POA: Diagnosis present

## 2019-08-30 DIAGNOSIS — Z87891 Personal history of nicotine dependence: Secondary | ICD-10-CM

## 2019-08-30 DIAGNOSIS — I82413 Acute embolism and thrombosis of femoral vein, bilateral: Secondary | ICD-10-CM | POA: Diagnosis present

## 2019-08-30 DIAGNOSIS — R002 Palpitations: Secondary | ICD-10-CM

## 2019-08-30 DIAGNOSIS — I82409 Acute embolism and thrombosis of unspecified deep veins of unspecified lower extremity: Secondary | ICD-10-CM

## 2019-08-30 DIAGNOSIS — I824Y2 Acute embolism and thrombosis of unspecified deep veins of left proximal lower extremity: Secondary | ICD-10-CM | POA: Diagnosis not present

## 2019-08-30 HISTORY — PX: IR INFUSION THROMBOL ARTERIAL INITIAL (MS): IMG5376

## 2019-08-30 HISTORY — PX: IR ANGIOGRAM PULMONARY BILATERAL SELECTIVE: IMG664

## 2019-08-30 HISTORY — PX: IR US GUIDE VASC ACCESS LEFT: IMG2389

## 2019-08-30 LAB — COMPREHENSIVE METABOLIC PANEL
ALT: 79 U/L — ABNORMAL HIGH (ref 0–44)
AST: 87 U/L — ABNORMAL HIGH (ref 15–41)
Albumin: 3.5 g/dL (ref 3.5–5.0)
Alkaline Phosphatase: 89 U/L (ref 38–126)
Anion gap: 9 (ref 5–15)
BUN: 21 mg/dL (ref 8–23)
CO2: 22 mmol/L (ref 22–32)
Calcium: 10.5 mg/dL — ABNORMAL HIGH (ref 8.9–10.3)
Chloride: 108 mmol/L (ref 98–111)
Creatinine, Ser: 0.83 mg/dL (ref 0.61–1.24)
GFR calc Af Amer: >60 mL/min (ref >60)
GFR calc non Af Amer: >60 mL/min (ref >60)
Glucose, Bld: 130 mg/dL — ABNORMAL HIGH (ref 70–99)
Potassium: 4.4 mmol/L (ref 3.5–5.1)
Sodium: 139 mmol/L (ref 135–145)
Total Bilirubin: 0.9 mg/dL (ref 0.3–1.2)
Total Protein: 7.6 g/dL (ref 6.5–8.1)

## 2019-08-30 LAB — CBC WITH DIFFERENTIAL/PLATELET
Abs Immature Granulocytes: 0.08 10*3/uL — ABNORMAL HIGH (ref 0.00–0.07)
Basophils Absolute: 0 10*3/uL (ref 0.0–0.1)
Basophils Relative: 0 %
Eosinophils Absolute: 0 10*3/uL (ref 0.0–0.5)
Eosinophils Relative: 0 %
HCT: 38.3 % — ABNORMAL LOW (ref 39.0–52.0)
Hemoglobin: 12.8 g/dL — ABNORMAL LOW (ref 13.0–17.0)
Immature Granulocytes: 1 %
Lymphocytes Relative: 9 %
Lymphs Abs: 0.9 10*3/uL (ref 0.7–4.0)
MCH: 31.2 pg (ref 26.0–34.0)
MCHC: 33.4 g/dL (ref 30.0–36.0)
MCV: 93.4 fL (ref 80.0–100.0)
Monocytes Absolute: 1.3 10*3/uL — ABNORMAL HIGH (ref 0.1–1.0)
Monocytes Relative: 13 %
Neutro Abs: 7.8 10*3/uL — ABNORMAL HIGH (ref 1.7–7.7)
Neutrophils Relative %: 77 %
Platelets: 296 10*3/uL (ref 150–400)
RBC: 4.1 MIL/uL — ABNORMAL LOW (ref 4.22–5.81)
RDW: 12.7 % (ref 11.5–15.5)
WBC: 10 10*3/uL (ref 4.0–10.5)
nRBC: 0 % (ref 0.0–0.2)

## 2019-08-30 LAB — BRAIN NATRIURETIC PEPTIDE: B Natriuretic Peptide: 1129.6 pg/mL — ABNORMAL HIGH (ref 0.0–100.0)

## 2019-08-30 LAB — I-STAT CHEM 8, ED
BUN: 18 mg/dL (ref 8–23)
Calcium, Ion: 1.44 mmol/L — ABNORMAL HIGH (ref 1.15–1.40)
Chloride: 107 mmol/L (ref 98–111)
Creatinine, Ser: 0.8 mg/dL (ref 0.61–1.24)
Glucose, Bld: 123 mg/dL — ABNORMAL HIGH (ref 70–99)
HCT: 38 % — ABNORMAL LOW (ref 39.0–52.0)
Hemoglobin: 12.9 g/dL — ABNORMAL LOW (ref 13.0–17.0)
Potassium: 4.3 mmol/L (ref 3.5–5.1)
Sodium: 140 mmol/L (ref 135–145)
TCO2: 22 mmol/L (ref 22–32)

## 2019-08-30 LAB — TROPONIN I (HIGH SENSITIVITY)
Troponin I (High Sensitivity): 59 ng/L — ABNORMAL HIGH (ref <18)
Troponin I (High Sensitivity): 62 ng/L — ABNORMAL HIGH (ref <18)

## 2019-08-30 LAB — POC SARS CORONAVIRUS 2 AG -  ED: SARS Coronavirus 2 Ag: NEGATIVE

## 2019-08-30 LAB — HIV ANTIBODY (ROUTINE TESTING W REFLEX): HIV Screen 4th Generation wRfx: NONREACTIVE

## 2019-08-30 LAB — CBG MONITORING, ED: Glucose-Capillary: 112 mg/dL — ABNORMAL HIGH (ref 70–99)

## 2019-08-30 LAB — MRSA PCR SCREENING: MRSA by PCR: NEGATIVE

## 2019-08-30 LAB — FIBRINOGEN
Fibrinogen: 285 mg/dL (ref 210–475)
Fibrinogen: 263 mg/dL (ref 210–475)

## 2019-08-30 LAB — HEPARIN LEVEL (UNFRACTIONATED)
Heparin Unfractionated: 0.51 IU/mL (ref 0.30–0.70)
Heparin Unfractionated: 0.47 IU/mL (ref 0.30–0.70)
Heparin Unfractionated: 0.5 IU/mL (ref 0.30–0.70)

## 2019-08-30 LAB — APTT: aPTT: 28 seconds (ref 24–36)

## 2019-08-30 LAB — PROTIME-INR
INR: 1.2 (ref 0.8–1.2)
Prothrombin Time: 15.4 seconds — ABNORMAL HIGH (ref 11.4–15.2)

## 2019-08-30 LAB — SARS CORONAVIRUS 2 (TAT 6-24 HRS): SARS Coronavirus 2: NEGATIVE

## 2019-08-30 MED ORDER — DORZOLAMIDE HCL 2 % OP SOLN
1.0000 [drp] | Freq: Two times a day (BID) | OPHTHALMIC | Status: DC
  Administered 2019-08-30 – 2019-09-02 (×7): 1 [drp] via OPHTHALMIC
  Filled 2019-08-30: qty 10

## 2019-08-30 MED ORDER — MIDAZOLAM HCL 2 MG/2ML IJ SOLN
INTRAMUSCULAR | Status: AC | PRN
  Administered 2019-08-30 (×2): 1 mg via INTRAVENOUS
  Administered 2019-08-30: 0.5 mg via INTRAVENOUS

## 2019-08-30 MED ORDER — IOHEXOL 350 MG/ML SOLN
100.0000 mL | Freq: Once | INTRAVENOUS | Status: AC | PRN
  Administered 2019-08-30: 100 mL via INTRAVENOUS

## 2019-08-30 MED ORDER — SODIUM CHLORIDE 0.9% FLUSH: 3.0000 mL | INTRAVENOUS | Status: DC | PRN

## 2019-08-30 MED ORDER — MORPHINE SULFATE (PF) 2 MG/ML IV SOLN
2.0000 mg | INTRAVENOUS | Status: DC | PRN
  Administered 2019-08-30 – 2019-08-31 (×3): 2 mg via INTRAVENOUS
  Filled 2019-08-30 (×3): qty 1

## 2019-08-30 MED ORDER — LIDOCAINE HCL (PF) 1 % IJ SOLN
INTRAMUSCULAR | Status: AC | PRN
  Administered 2019-08-30: 5 mL via INTRADERMAL

## 2019-08-30 MED ORDER — IOHEXOL 300 MG/ML  SOLN: 100.0000 mL | Freq: Once | INTRAMUSCULAR | Status: DC | PRN

## 2019-08-30 MED ORDER — FENTANYL CITRATE (PF) 100 MCG/2ML IJ SOLN
INTRAMUSCULAR | Status: AC
  Filled 2019-08-30: qty 2

## 2019-08-30 MED ORDER — ACETAMINOPHEN 325 MG PO TABS: 650.0000 mg | ORAL_TABLET | Freq: Four times a day (QID) | ORAL | Status: DC | PRN

## 2019-08-30 MED ORDER — MIDAZOLAM HCL 2 MG/2ML IJ SOLN
INTRAMUSCULAR | Status: AC
  Filled 2019-08-30: qty 2

## 2019-08-30 MED ORDER — SODIUM CHLORIDE 0.9 % IV SOLN: 250.0000 mL | INTRAVENOUS | Status: DC | PRN

## 2019-08-30 MED ORDER — HEPARIN (PORCINE) 25000 UT/250ML-% IV SOLN
1500.0000 [IU]/h | INTRAVENOUS | Status: AC
  Administered 2019-08-30 – 2019-09-02 (×5): 1500 [IU]/h via INTRAVENOUS
  Filled 2019-08-30 (×5): qty 250

## 2019-08-30 MED ORDER — ORAL CARE MOUTH RINSE
15.0000 mL | Freq: Two times a day (BID) | OROMUCOSAL | Status: DC
  Administered 2019-08-30 – 2019-09-02 (×7): 15 mL via OROMUCOSAL

## 2019-08-30 MED ORDER — FENTANYL CITRATE (PF) 100 MCG/2ML IJ SOLN
INTRAMUSCULAR | Status: AC | PRN
  Administered 2019-08-30: 25 ug via INTRAVENOUS
  Administered 2019-08-30: 50 ug via INTRAVENOUS
  Administered 2019-08-30: 25 ug via INTRAVENOUS

## 2019-08-30 MED ORDER — CHLORHEXIDINE GLUCONATE CLOTH 2 % EX PADS
6.0000 | MEDICATED_PAD | Freq: Every day | CUTANEOUS | Status: DC
  Administered 2019-08-30 – 2019-09-02 (×4): 6 via TOPICAL

## 2019-08-30 MED ORDER — ACETAMINOPHEN 650 MG RE SUPP: 650.0000 mg | Freq: Four times a day (QID) | RECTAL | Status: DC | PRN

## 2019-08-30 MED ORDER — LATANOPROSTENE BUNOD 0.024 % OP SOLN
1.0000 [drp] | Freq: Every day | OPHTHALMIC | Status: DC
  Administered 2019-08-30 – 2019-09-01 (×3): 1 [drp] via OPHTHALMIC

## 2019-08-30 MED ORDER — SODIUM CHLORIDE 0.9% FLUSH
3.0000 mL | Freq: Two times a day (BID) | INTRAVENOUS | Status: DC
  Administered 2019-09-01 – 2019-09-02 (×2): 3 mL via INTRAVENOUS

## 2019-08-30 MED ORDER — HEPARIN BOLUS VIA INFUSION
5000.0000 [IU] | Freq: Once | INTRAVENOUS | Status: AC
  Administered 2019-08-30: 5000 [IU] via INTRAVENOUS
  Filled 2019-08-30: qty 5000

## 2019-08-30 MED ORDER — SODIUM CHLORIDE 0.9 % IV SOLN: INTRAVENOUS | Status: DC

## 2019-08-30 MED ORDER — TIMOLOL MALEATE 0.5 % OP SOLN
1.0000 [drp] | Freq: Two times a day (BID) | OPHTHALMIC | Status: DC
  Administered 2019-08-30 – 2019-09-02 (×7): 1 [drp] via OPHTHALMIC
  Filled 2019-08-30: qty 5

## 2019-08-30 MED ORDER — "THROMBI-PAD 3""X3"" EX PADS"
1.0000 | MEDICATED_PAD | Freq: Once | CUTANEOUS | Status: AC
  Administered 2019-08-30: 23:00:00 1 via TOPICAL
  Filled 2019-08-30 (×2): qty 1

## 2019-08-30 MED ORDER — IOHEXOL 300 MG/ML  SOLN
100.0000 mL | Freq: Once | INTRAMUSCULAR | Status: AC | PRN
  Administered 2019-08-30: 17:00:00 40 mL via INTRA_ARTERIAL

## 2019-08-30 MED ORDER — SODIUM CHLORIDE (PF) 0.9 % IJ SOLN
INTRAMUSCULAR | Status: AC
  Filled 2019-08-30: qty 50

## 2019-08-30 MED ORDER — METOPROLOL TARTRATE 50 MG PO TABS
50.0000 mg | ORAL_TABLET | Freq: Two times a day (BID) | ORAL | Status: DC
  Administered 2019-08-30 – 2019-09-02 (×7): 50 mg via ORAL
  Filled 2019-08-30 (×2): qty 2
  Filled 2019-08-30 (×2): qty 1
  Filled 2019-08-30 (×3): qty 2

## 2019-08-30 MED ORDER — IOHEXOL 300 MG/ML  SOLN
50.0000 mL | Freq: Once | INTRAMUSCULAR | Status: AC | PRN
  Administered 2019-08-31: 10 mL via INTRA_ARTERIAL

## 2019-08-30 MED ORDER — SODIUM CHLORIDE 0.9 % IV BOLUS
1000.0000 mL | Freq: Once | INTRAVENOUS | Status: AC
  Administered 2019-08-30: 1000 mL via INTRAVENOUS

## 2019-08-30 MED ORDER — LIDOCAINE HCL 1 % IJ SOLN
INTRAMUSCULAR | Status: AC
  Filled 2019-08-30: qty 20

## 2019-08-30 MED ORDER — SODIUM CHLORIDE 0.9 % IV SOLN
12.0000 mg | Freq: Once | INTRAVENOUS | Status: AC
  Administered 2019-08-30: 18:00:00 12 mg via INTRAVENOUS
  Filled 2019-08-30: qty 12

## 2019-08-30 MED ORDER — "THROMBI-PAD 3""X3"" EX PADS"
1.0000 | MEDICATED_PAD | Freq: Once | CUTANEOUS | Status: AC
  Administered 2019-08-30: 23:00:00 1 via TOPICAL
  Filled 2019-08-30: qty 1

## 2019-08-30 MED ORDER — TAMSULOSIN HCL 0.4 MG PO CAPS
0.4000 mg | ORAL_CAPSULE | Freq: Every day | ORAL | Status: DC
  Administered 2019-08-30 – 2019-09-01 (×3): 0.4 mg via ORAL
  Filled 2019-08-30 (×3): qty 1

## 2019-08-30 NOTE — Progress Notes (Signed)
ANTICOAGULATION CONSULT NOTE - Initial Consult  Pharmacy Consult for IV Heparin Indication: pulmonary embolus  No Known Allergies  Patient Measurements: Height: 6\' 3"  (190.5 cm) Weight: 172 lb (78 kg) IBW/kg (Calculated) : 84.5 Heparin Dosing Weight: actual  Vital Signs: Temp: 97.9 F (36.6 C) (02/17 0218) Temp Source: Oral (02/17 0218) BP: 120/82 (02/17 0600) Pulse Rate: 83 (02/17 0600)  Labs: Recent Labs    08/30/19 0445 08/30/19 0453  HGB 12.8* 12.9*  HCT 38.3* 38.0*  PLT 296  --   CREATININE 0.83 0.80  TROPONINIHS 59*  --     Estimated Creatinine Clearance: 100.2 mL/min (by C-G formula based on SCr of 0.8 mg/dL).   Medical History: Past Medical History:  Diagnosis Date  . Aortic root dilatation (HCC) 02/21/2019  . Dyspnea on exertion   . Frequent unifocal PVCs 02/21/2019  . Glaucoma   . Gonorrhea    [age] yrs of age; negative for syphillis, herpes and HIV  . Left inguinal hernia   . Pneumonia   . Ventricular premature beats   . Wears dentures     Medications:  Scheduled:  . heparin  5,000 Units Intravenous Once  . sodium chloride (PF)       Infusions:  . sodium chloride 125 mL/hr at 08/30/19 0458  . heparin      Assessment: 69 yoM c/o loss of consciousness found to have massive PE with complete occlusion of the right main pulmonary artery and absent pulmonary arterial blood flow to right lung.  Baselines: H/H, plts WNL. aptt and INR pending  Goal of Therapy:  Heparin level 0.3-0.7 units/ml Monitor platelets by anticoagulation protocol: Yes   Plan:  aptt and INR stat Heparin 5000 unit IV bolus x1 now Start drip at 1500 units/hr Daily CBC/HL Check 1st HL in 6 hours  71 08/30/2019,6:50 AM

## 2019-08-30 NOTE — Procedures (Signed)
Pre procedural Dx: Submassive PE Post procedural Dx: Same  Technically successful initiation of bilateral pulmonary arterial catheter directed thrombolysis for submassive PE.  Large, near occlusive bilateral PE burden demonstrates on limited main and right pulmonary arteriograms.  Main PA pressure - 61/16 (mean - 34)  Access: L CFV x2; Keep left leg straight while infusion catheters are in place.  EBL: None Complications: None immediate  Katherina Right, MD Pager #: [phone]

## 2019-08-30 NOTE — Progress Notes (Signed)
ANTICOAGULATION CONSULT NOTE Pharmacy Consult for IV Heparin Indication: pulmonary embolus  No Known Allergies  Patient Measurements: Height: 6\' 3"  (190.5 cm) Weight: 172 lb (78 kg) IBW/kg (Calculated) : 84.5 Heparin Dosing Weight: actual  Vital Signs: Temp: 97.9 F (36.6 C) (02/17 1200) Temp Source: Oral (02/17 1200) BP: 124/91 (02/17 1800) Pulse Rate: 81 (02/17 1800)  Labs: Recent Labs    08/30/19 0445 08/30/19 0453 08/30/19 0656 08/30/19 1338 08/30/19 1637  HGB 12.8* 12.9*  --   --   --   HCT 38.3* 38.0*  --   --   --   PLT 296  --   --   --   --   APTT  --   --  28  --   --   LABPROT  --   --  15.4*  --   --   INR  --   --  1.2  --   --   HEPARINUNFRC  --   --   --  0.51 0.47  CREATININE 0.83 0.80  --   --   --   TROPONINIHS 59*  --  62*  --   --     Estimated Creatinine Clearance: 100.2 mL/min (by C-G formula based on SCr of 0.8 mg/dL).   Assessment: 29 yoM c/o loss of consciousness found to have massive PE with complete occlusion of the right main pulmonary artery and absent pulmonary arterial blood flow to right lung. Patient underwent catheter directed thrombolysis on 2/17 around 17:00. Pharmacy to dose IV heparin for PE  Baselines: Hg 12.9, PLTC 296, INR 1.2, aPTT 28  08/30/2019  Confirmatory heparin level therapeutic at 0.47 units/hr  Catheter directed thrombolysis completed at 1700 without complication  Post procedural CBC pending  Goal of Therapy:  Heparin level 0.3-0.7 units/ml Monitor platelets by anticoagulation protocol: Yes   Plan:   Continue heparin drip at 1500 units/hr  CBC & Heparin levels q6 hr x 24 hr; will order daily CBC and PRN heparin levels afterward  Per updated protocol, heparin is no longer paused for sheath removal  F/u for transition to long-term anticoagulant  Monitor for s/s bleeding or worsening thrombosis  09/01/2019, PharmD, BCPS [phone] 08/30/2019, 8:00 PM

## 2019-08-30 NOTE — Sedation Documentation (Signed)
Report called to Lanora Manis, RN on 2W

## 2019-08-30 NOTE — Consult Note (Signed)
NAME:  Joe Cantu, MRN:  [id number], DOB:  29-Nov-1950, LOS: 0 ADMISSION DATE:  08/30/2019, CONSULTATION DATE:  08/29/2018 REFERRING MD: Maralyn Sago MD , CHIEF COMPLAINT: Submassive PE  Brief History   69 year old with history of PVCs, aortic root dilatation.  Admitted with syncopal episode today morning while urinating.  EMS called by fianc. He had mild hypotension in the ED which responded to fluids.  Patient had been feeling short of breath for 2 weeks prior to admission.  Denies any immobility, surgery, trauma.  No recent travel.  Reports that his mother had a blood clot and stroke many years ago.  Work-up shows significant bilateral PE with right heart strain.  PCCM consulted for recommendations.  Past Medical History    has a past medical history of Aortic root dilatation (HCC) (02/21/2019), Dyspnea on exertion, Frequent unifocal PVCs (02/21/2019), Glaucoma, Gonorrhea, Left inguinal hernia, Pneumonia, Ventricular premature beats, and Wears dentures.  Significant Hospital Events   2/17- Admit  Consults:  PCCM, IR  Procedures:    Significant Diagnostic Tests:  CTA 08/30/2019-bilateral pulmonary emboli with near complete occlusion of the right pulmonary artery  Echo 08/30/2019-severe RA/RV dilatation with severe pulmonary hypertension, LVEF 65-70%    Micro Data:    Antimicrobials:     Interim history/subjective:    Objective   Blood pressure (!) 131/94, pulse 80, temperature 97.9 F (36.6 C), temperature source Oral, resp. rate 20, height 6\' 3"  (1.905 m), weight 78 kg, SpO2 95 %.        Intake/Output Summary (Last 24 hours) at 08/30/2019 1311 Last data filed at 08/30/2019 1141 Gross per 24 hour  Intake 1115.46 ml  Output --  Net 1115.46 ml   Filed Weights   08/30/19 0411  Weight: 78 kg    Examination: Blood pressure 105/69, pulse 84, temperature 97.9 F (36.6 C), temperature source Oral, resp. rate 17, height 6\' 3"  (1.905 m), weight 78 kg, SpO2 98 %. Gen:       No acute distress HEENT:  EOMI, sclera anicteric Neck:     No masses; no thyromegaly Lungs:    Clear to auscultation bilaterally; normal respiratory effort CV:        Sinus tachycardia Abd:      + bowel sounds; soft, non-tender; no palpable masses, no distension Ext:    No edema; adequate peripheral perfusion Skin:      Warm and dry; no rash Neuro: alert and oriented x 3 Psych: normal mood and affect  Resolved Hospital Problem list     Assessment & Plan:  Acute submassive PE He has significant RV dysfunction as seen on echocardiogram, elevated BNP, trop Although he is hemodynamically stable at present he has transient desats and is symptomatic on minimal exertion Given presentation with syncope he is at high risk for decompensation  Recommend catheter directed thrombolytics.  Discussed with Dr. 09/01/19 from IR who agrees and had requested transfer to [facility] to facilitate procedure Continue IV heparin.  Best practice:  Diet: NPO Pain/Anxiety/Delirium protocol (if indicated):NA VAP protocol (if indicated): NA DVT prophylaxis: Heparin GI prophylaxis: NA Glucose control: Monitor Mobility: Bed Code Status: Full Family Communication: Patient updated Disposition: ICU  Labs   CBC: Recent Labs  Lab 08/30/19 0445 08/30/19 0453  WBC 10.0  --   NEUTROABS 7.8*  --   HGB 12.8* 12.9*  HCT 38.3* 38.0*  MCV 93.4  --   PLT 296  --     Basic Metabolic Panel: Recent Labs  Lab 08/30/19  0445 08/30/19 0453  NA 139 140  K 4.4 4.3  CL 108 107  CO2 22  --   GLUCOSE 130* 123*  BUN 21 18  CREATININE 0.83 0.80  CALCIUM 10.5*  --    GFR: Estimated Creatinine Clearance: 100.2 mL/min (by C-G formula based on SCr of 0.8 mg/dL). Recent Labs  Lab 08/30/19 0445  WBC 10.0    Liver Function Tests: Recent Labs  Lab 08/30/19 0445  AST 87*  ALT 79*  ALKPHOS 89  BILITOT 0.9  PROT 7.6  ALBUMIN 3.5   No results for input(s): LIPASE, AMYLASE in the last 168 hours. No results  for input(s): AMMONIA in the last 168 hours.  ABG    Component Value Date/Time   TCO2 22 08/30/2019 0453     Coagulation Profile: Recent Labs  Lab 08/30/19 0656  INR 1.2    Cardiac Enzymes: No results for input(s): CKTOTAL, CKMB, CKMBINDEX, TROPONINI in the last 168 hours.  HbA1C: No results found for: HGBA1C  CBG: Recent Labs  Lab 08/30/19 0422  GLUCAP 112*    Review of Systems:   REVIEW OF SYSTEMS:   All negative; except for those that are bolded, which indicate positives.  Constitutional: weight loss, weight gain, night sweats, fevers, chills, fatigue, weakness.  HEENT: headaches, sore throat, sneezing, nasal congestion, post nasal drip, difficulty swallowing, tooth/dental problems, visual complaints, visual changes, ear aches. Neuro: difficulty with speech, weakness, numbness, ataxia. CV:  chest pain, orthopnea, PND, swelling in lower extremities, dizziness, palpitations, syncope.  Resp: cough, hemoptysis, dyspnea, wheezing. GI: heartburn, indigestion, abdominal pain, nausea, vomiting, diarrhea, constipation, change in bowel habits, loss of appetite, hematemesis, melena, hematochezia.  GU: dysuria, change in color of urine, urgency or frequency, flank pain, hematuria. MSK: joint pain or swelling, decreased range of motion. Psych: change in mood or affect, depression, anxiety, suicidal ideations, homicidal ideations. Skin: rash, itching, bruising.  Past Medical History  He,  has a past medical history of Aortic root dilatation ([place]) (02/21/2019), Dyspnea on exertion, Frequent unifocal PVCs (02/21/2019), Glaucoma, Gonorrhea, Left inguinal hernia, Pneumonia, Ventricular premature beats, and Wears dentures.   Surgical History    Past Surgical History:  Procedure Laterality Date  . COLONOSCOPY  07/2018   Benign polyps   . HIATAL HERNIA REPAIR       Social History   reports that he quit smoking about 51 years ago. His smoking use included cigarettes. He has a 5.00  pack-year smoking history. He has never used smokeless tobacco. He reports current alcohol use. He reports that he does not use drugs.   Family History   His family history includes Cancer in his father; Diabetes in his brother, mother, and sister.   Allergies No Known Allergies   Home Medications  Prior to Admission medications   Medication Sig Start Date End Date Taking? Authorizing Provider  aspirin 81 MG tablet Take 81 mg by mouth daily.    Yes [provider]  dorzolamide (TRUSOPT) 2 % ophthalmic solution Place 1 drop into both eyes 2 (two) times daily. 08/27/19  Yes [provider]  Latanoprostene Bunod (VYZULTA) 0.024 % SOLN Apply 1 drop to eye daily.   Yes [provider]  metoprolol tartrate (LOPRESSOR) 50 MG tablet Take 1 tablet (50 mg total) by mouth 2 (two) times daily. 06/12/19  Yes Adrian Prows, MD  tamsulosin (FLOMAX) 0.4 MG CAPS capsule Take 0.4 mg by mouth at bedtime. 10/26/18  Yes [provider]  timolol (TIMOPTIC) 0.5 % ophthalmic  solution Place 1 drop into both eyes 2 (two) times daily. 08/27/19  Yes [provider]     Critical care time:    The patient is critically ill with multiple organ system failure and requires high complexity decision making for assessment and support, frequent evaluation and titration of therapies, advanced monitoring, review of radiographic studies and interpretation of complex data.   Critical Care Time devoted to patient care services, exclusive of separately billable procedures, described in this note is 35 minutes.   Chilton Greathouse MD [facility] Please see [email or web address] for pager details.  08/30/2019, 1:48 PM

## 2019-08-30 NOTE — Progress Notes (Signed)
ANTICOAGULATION CONSULT NOTE Pharmacy Consult for IV Heparin Indication: pulmonary embolus  No Known Allergies  Patient Measurements: Height: 6\' 3"  (190.5 cm) Weight: 172 lb (78 kg) IBW/kg (Calculated) : 84.5 Heparin Dosing Weight: actual  Vital Signs: Temp: 97.9 F (36.6 C) (02/17 1200) Temp Source: Oral (02/17 1200) BP: 135/57 (02/17 1400) Pulse Rate: 69 (02/17 1400)  Labs: Recent Labs    08/30/19 0445 08/30/19 0453 08/30/19 0656 08/30/19 1338  HGB 12.8* 12.9*  --   --   HCT 38.3* 38.0*  --   --   PLT 296  --   --   --   APTT  --   --  28  --   LABPROT  --   --  15.4*  --   INR  --   --  1.2  --   HEPARINUNFRC  --   --   --  0.51  CREATININE 0.83 0.80  --   --   TROPONINIHS 59*  --  62*  --     Estimated Creatinine Clearance: 100.2 mL/min (by C-G formula based on SCr of 0.8 mg/dL).   Assessment: Joe Cantu c/o loss of consciousness found to have massive PE with complete occlusion of the right main pulmonary artery and absent pulmonary arterial blood flow to right lung.  Baselines: Hg 12.9, PLTC 296, INR 1.2, aPTT 28  08/30/2019 First heparin level therapeutic at 0.51 after 5000 unit bolus and drip at 1500 units/hr.  No bleeding per RN.   Goal of Therapy:  Heparin level 0.3-0.7 units/ml Monitor platelets by anticoagulation protocol: Yes   Plan:  Continue hepharin drip at 1500 units/hr Will check confirmatory heparin level in 6-8 hrs Daily CBC and heparin level  09/01/2019, Pharm.D [phone] 08/30/2019 2:21 PM

## 2019-08-30 NOTE — Progress Notes (Signed)
ANTICOAGULATION CONSULT NOTE Pharmacy Consult for IV Heparin Indication: pulmonary embolus  No Known Allergies  Patient Measurements: Height: 6\' 3"  (190.5 cm) Weight: 172 lb (78 kg) IBW/kg (Calculated) : 84.5 Heparin Dosing Weight: actual  Vital Signs: Temp: 99.2 F (37.3 C) (02/17 2326) Temp Source: Axillary (02/17 2326) BP: 124/91 (02/17 1800) Pulse Rate: 81 (02/17 1800)  Labs: Recent Labs    08/30/19 0445 08/30/19 0453 08/30/19 0656 08/30/19 1338 08/30/19 1637 08/30/19 2248  HGB 12.8* 12.9*  --   --   --   --   HCT 38.3* 38.0*  --   --   --   --   PLT 296  --   --   --   --   --   APTT  --   --  28  --   --   --   LABPROT  --   --  15.4*  --   --   --   INR  --   --  1.2  --   --   --   HEPARINUNFRC  --   --   --  0.51 0.47 0.50  CREATININE 0.83 0.80  --   --   --   --   TROPONINIHS 59*  --  62*  --   --   --     Estimated Creatinine Clearance: 100.2 mL/min (by C-G formula based on SCr of 0.8 mg/dL).   Assessment: 47 yoM c/o loss of consciousness found to have massive PE with complete occlusion of the right main pulmonary artery and absent pulmonary arterial blood flow to right lung. Patient underwent catheter directed thrombolysis on 2/17 around 17:00. Pharmacy to dose IV heparin for PE  Baselines: Hg 12.9, PLTC 296, INR 1.2, aPTT 28  08/30/2019  Confirmatory heparin level therapeutic at 0.47 units/hr  Catheter directed thrombolysis completed at 1700 without complication  Post procedural CBC pending  2248 HL = 0.50 at goal, Patient has some bleeding from femoral venipuncture sites- MD ordered thrombi pads to site  Goal of Therapy:  Heparin level 0.3-0.7 units/ml Monitor platelets by anticoagulation protocol: Yes   Plan:   Continue heparin drip at 1500 units/hr  CBC & Heparin levels q6 hr x 24 hr; will order daily CBC and PRN heparin levels afterward  Per updated protocol, heparin is no longer paused for sheath removal  F/u for transition to  long-term anticoagulant  Monitor for s/s bleeding or worsening thrombosis  09/01/2019 Lorenza Evangelist 08/30/2019, 11:52 PM

## 2019-08-30 NOTE — ED Provider Notes (Signed)
[facility]-EMERGENCY DEPT Provider Note  CSN: [id number] Arrival date & time: 08/30/19 0157  Chief Complaint(s) Loss of Consciousness  HPI Joe Cantu is a 69 y.o. male   The history is provided by the patient.  Loss of Consciousness Episode history:  Single Most recent episode:  Today Timing: once. Progression:  Resolved Chronicity:  New Context: standing up and urination   Witnessed: no   Worsened by:  Posture Associated symptoms: dizziness   Associated symptoms: no chest pain, no difficulty breathing, no fever, no focal weakness, no headaches, no malaise/fatigue, no nausea, no palpitations, no rectal bleeding, no seizures, no shortness of breath and no vomiting    Patient believes he has not been hydrating as well as she is used recently. Reports that for the last week has been having dizzy spells upon standing.  Past Medical History Past Medical History:  Diagnosis Date   Aortic root dilatation (HCC) 02/21/2019   Dyspnea on exertion    Frequent unifocal PVCs 02/21/2019   Glaucoma    Gonorrhea    18 & 19 yrs of age; negative for syphillis, herpes and HIV   Left inguinal hernia    Pneumonia    Ventricular premature beats    Wears dentures    Patient Active Problem List   Diagnosis Date Noted   Aortic root dilatation (HCC) 02/21/2019   Frequent unifocal PVCs 02/21/2019   Palpitations 09/19/2018   Inguinal hernia without mention of obstruction or gangrene, unilateral or unspecified, (not specified as recurrent)-right 11/24/2012   Home Medication(s) Prior to Admission medications   Medication Sig Start Date End Date Taking? Authorizing Provider  aspirin 81 MG tablet Take 81 mg by mouth daily.    Yes [provider]  dorzolamide (TRUSOPT) 2 % ophthalmic solution Place 1 drop into both eyes 2 (two) times daily. 08/27/19  Yes [provider]  Latanoprostene Bunod (VYZULTA) 0.024 % SOLN Apply 1 drop to eye daily.   Yes  [provider]  metoprolol tartrate (LOPRESSOR) 50 MG tablet Take 1 tablet (50 mg total) by mouth 2 (two) times daily. 06/12/19  Yes Yates Decamp, MD  tamsulosin (FLOMAX) 0.4 MG CAPS capsule Take 0.4 mg by mouth at bedtime. 10/26/18  Yes [provider]  timolol (TIMOPTIC) 0.5 % ophthalmic solution Place 1 drop into both eyes 2 (two) times daily. 08/27/19  Yes [provider]                                                                                                                                    Past Surgical History Past Surgical History:  Procedure Laterality Date   COLONOSCOPY  07/2018   Benign polyps    HIATAL HERNIA REPAIR     Family History Family History  Problem Relation Age of Onset   Diabetes Mother    Cancer Father        colon  Diabetes Sister    Diabetes Brother     Social History Social History   Tobacco Use   Smoking status: Former Smoker    Packs/day: 0.50    Years: 10.00    Pack years: 5.00    Types: Cigarettes    Quit date: 1970    Years since quitting: 51.1   Smokeless tobacco: Never Used  Substance Use Topics   Alcohol use: Yes    Comment: occasional wine   Drug use: No   Allergies Patient has no known allergies.  Review of Systems Review of Systems  Constitutional: Negative for fever and malaise/fatigue.  Respiratory: Negative for shortness of breath.   Cardiovascular: Positive for syncope. Negative for chest pain and palpitations.  Gastrointestinal: Negative for nausea and vomiting.  Neurological: Positive for dizziness. Negative for focal weakness, seizures and headaches.   All other systems are reviewed and are negative for acute change except as noted in the HPI  Physical Exam Vital Signs  I have reviewed the triage vital signs BP (!) 87/73 BP Location: Right Arm)    Pulse 80    Temp 97.9 F (36.6 C) (Oral)    Resp 20    Ht 6\' 3"  (1.905 m)    Wt 78 kg    SpO2 99%    BMI 21.50 kg/m    Physical Exam Vitals reviewed.  Constitutional:      General: He is not in acute distress.    Appearance: He is well-developed. He is not diaphoretic.  HENT:     Head: Normocephalic and atraumatic.     Nose: Nose normal.  Eyes:     General: No scleral icterus.       Right eye: No discharge.        Left eye: No discharge.     Conjunctiva/sclera: Conjunctivae normal.     Pupils: Pupils are equal, round, and reactive to light.  Cardiovascular:     Rate and Rhythm: Normal rate. Rhythm regularly irregular.     Heart sounds: No murmur. No friction rub. No gallop.   Pulmonary:     Effort: Pulmonary effort is normal. No respiratory distress.     Breath sounds: Normal breath sounds. No stridor. No rales.  Abdominal:     General: There is no distension.     Palpations: Abdomen is soft.     Tenderness: There is no abdominal tenderness.  Musculoskeletal:        General: No tenderness.     Cervical back: Normal range of motion and neck supple.  Skin:    General: Skin is warm and dry.     Findings: No erythema or rash.  Neurological:     Mental Status: He is alert and oriented to person, place, and time.     ED Results and Treatments Labs (all labs ordered are listed, but only abnormal results are displayed) Labs Reviewed  CBC WITH DIFFERENTIAL/PLATELET - Abnormal; Notable for the following components:      Result Value   RBC 4.10 (*)    Hemoglobin 12.8 (*)    HCT 38.3 (*)    Neutro Abs 7.8 (*)    Monocytes Absolute 1.3 (*)    Abs Immature Granulocytes 0.08 (*)    All other components within normal limits  COMPREHENSIVE METABOLIC PANEL - Abnormal; Notable for the following components:   Glucose, Bld 130 (*)    Calcium 10.5 (*)    AST 87 (*)    ALT 79 (*)  All other components within normal limits  CBG MONITORING, ED - Abnormal; Notable for the following components:   Glucose-Capillary 112 (*)    All other components within normal limits  I-STAT CHEM 8, ED - Abnormal;  Notable for the following components:   Glucose, Bld 123 (*)    Calcium, Ion 1.44 (*)    Hemoglobin 12.9 (*)    HCT 38.0 (*)    All other components within normal limits  TROPONIN I (HIGH SENSITIVITY) - Abnormal; Notable for the following components:   Troponin I (High Sensitivity) 59 (*)    All other components within normal limits  BRAIN NATRIURETIC PEPTIDE  POC SARS CORONAVIRUS 2 AG -  ED  TROPONIN I (HIGH SENSITIVITY)                                                                                                                         EKG  EKG Interpretation  Date/Time:  Wednesday August 30 2019 02:17:53 EST Ventricular Rate:  81 PR Interval:    QRS Duration: 105 QT Interval:  444 QTC Calculation: 516 R Axis:   -176 Text Interpretation: Sinus rhythm Multiple premature complexes, vent & supraven Probable left atrial enlargement Right axis deviation Probable anteroseptal infarct, old Abnrm T, consider ischemia, anterolateral lds Prolonged QT interval NO STEMI. No old tracing to compare Confirmed by Drema Pry [phone]) on 08/30/2019 2:41:46 AM      Radiology CT Angio Chest PE W and/or Wo Contrast  Result Date: 08/30/2019 CLINICAL DATA:  Syncopal episode while using the bathroom. EXAM: CT ANGIOGRAPHY CHEST WITH CONTRAST TECHNIQUE: Multidetector CT imaging of the chest was performed using the standard protocol during bolus administration of intravenous contrast. Multiplanar CT image reconstructions and MIPs were obtained to evaluate the vascular anatomy. CONTRAST:  OMNIPAQUE IOHEXOL 350 MG/ML SOLN COMPARISON:  None. FINDINGS: Cardiovascular: Massive bilateral pulmonary arterial filling defects with near complete occlusion of the right pulmonary artery, no pulmonary arterial blood flow noted to the right lung. Large thrombus in the left lower lobe pulmonary artery which is partially occlusive. Marked right heart dilatation with contrast refluxing into the hepatic veins and IVC.  Elevated RV to LV ratio of 2.16. Aortic atherosclerosis. Cannot assess for dissection given phase of contrast. Mild dilatation of the aortic root at 3.6 cm without aortic aneurysm. No pericardial effusion. Mediastinum/Nodes: No adenopathy. No esophageal wall thickening. No suspicious thyroid nodule. Lungs/Pleura: Triangular subpleural opacities in the right upper and lower lobes likely pulmonary infarct. Diminished pulmonary vessels in the right lung related to pulmonary embolus. Subpleural opacities in the left lower lobe favor atelectasis. No pulmonary mass. No pulmonary edema. Upper Abdomen: Contrast refluxes into the hepatic veins and IVC. No other acute findings. Musculoskeletal: There are no acute or suspicious osseous abnormalities. Few bone islands in the right proximal humerus. Review of the MIP images confirms the above findings. IMPRESSION: 1. Massive pulmonary emboli with complete occlusion of the right main pulmonary artery and absent pulmonary arterial blood  flow to the right lung. Large but partially occlusive filling defects in the left lower lobe pulmonary arteries. Significant right heart strain with RV to LV ratio of greater than 2, right heart dilatation and contrast refluxing into the hepatic veins and IVC. 2. Small pulmonary infarcts in the right upper and lower lobe. Critical Value/emergent results were called by telephone at the time of interpretation on 08/30/2019 at 6:24 am to Dr Addison Lank , who verbally acknowledged these results. Electronically Signed   By: Keith Rake M.D.   On: 08/30/2019 06:30    Pertinent labs & imaging results that were available during my care of the patient were reviewed by me and considered in my medical decision making (see chart for details).  Medications Ordered in ED Medications  sodium chloride 0.9 % bolus 1,000 mL (0 mLs Intravenous Stopped 08/30/19 0458)    And  0.9 %  sodium chloride infusion ( Intravenous New Bag/Given 08/30/19 0458)    sodium chloride (PF) 0.9 % injection (has no administration in time range)  iohexol (OMNIPAQUE) 350 MG/ML injection 100 mL (100 mLs Intravenous Contrast Given 08/30/19 0605)                                                                                                                                    Procedures .Critical Care Performed by: Fatima Blank, MD Authorized by: Fatima Blank, MD     CRITICAL CARE Performed by: Grayce Sessions Katelyne Galster Total critical care time: 55 minutes Critical care time was exclusive of separately billable procedures and treating other patients. Critical care was necessary to treat or prevent imminent or life-threatening deterioration. Critical care was time spent personally by me on the following activities: development of treatment plan with patient and/or surrogate as well as nursing, discussions with consultants, evaluation of patient's response to treatment, examination of patient, obtaining history from patient or surrogate, ordering and performing treatments and interventions, ordering and review of laboratory studies, ordering and review of radiographic studies, pulse oximetry and re-evaluation of patient's condition.   (including critical care time)  Medical Decision Making / ED Course I have reviewed the nursing notes for this encounter and the patient's prior records (if available in EHR or on provided paperwork).   KRISTIAN MOGG was evaluated in Emergency Department on 08/30/2019 for the symptoms described in the history of present illness. He was evaluated in the context of the global COVID-19 pandemic, which necessitated consideration that the patient might be at risk for infection with the SARS-CoV-2 virus that causes COVID-19. Institutional protocols and algorithms that pertain to the evaluation of patients at risk for COVID-19 are in a state of rapid change based on information released by regulatory bodies including the CDC  and federal and state organizations. These policies and algorithms were followed during the patient's care in the ED.  Patient is hypotensive and mildly hypoxic. EKG with frequent PVCs and PACs.  No prior tracings for comparison.  Orthostatics positive. Given IVF Trop mildly elevated.  BPs improved. Given low BP with low sats, will need to rule out PE. CTA ordered.  Labs w/o anemia and patient denied bloody BM or melena.  CT PE study revealed massive bilateral PEs with right heart strain. Consulted critical care who requested a hospitalist admission to the ICU under stepdown status.  Echocardiogram ordered. Patient started on heparin drip.  Discussed with hospitalist for admission.       Final Clinical Impression(s) / ED Diagnoses Final diagnoses:  Acute massive pulmonary embolism (HCC)  Syncope and collapse      This chart was dictated using voice recognition software.  Despite best efforts to proofread,  errors can occur which can change the documentation meaning.   Nira Conn, MD 08/30/19 [phone]

## 2019-08-30 NOTE — Progress Notes (Signed)
  Echocardiogram 2D Echocardiogram has been performed.  Joe Cantu [name] 08/30/2019, 10:17 AM

## 2019-08-30 NOTE — Consult Note (Addendum)
Chief Complaint: Patient was seen in consultation today for pulmonary arteriogram with catheter directed thrombolytic therapy/EKOS of pulmonary emboli Chief Complaint  Patient presents with  . Loss of Consciousness    Referring Physician(s): Mannam,P  Supervising Physician: Wicket.Sidle  Patient Status: [facility] - In-pt   History of Present Illness: Joe Cantu is a 69 y.o. male , remote smoker, with prior medical history significant for PVCs and aortic root dilatation.  He was admitted to  [facility] today following syncopal episode at home this morning while urinating.  EMS was contacted by fianc.  Patient relates to history of some dyspnea with exertion and occasional cough.  He denies fever, headache, chest pain, abdominal/back pain, nausea, vomiting or bleeding.  No recent history of surgery, cancer or trauma.  No known Covid exposures. Pt states mother had a blood clot and stroke many years ago.  CT angio of chest today revealed massive pulmonary emboli with complete occlusion of the right main pulmonary artery and absent pulmonary arterial blood flow to the right lung.  There is large but partially occlusive filling defects in the left lower lobe pulmonary arteries, significant right heart strain with RV to LV ratio greater than 2, right heart dilatation and contrast refluxing into the hepatic veins and IVC.  Also noted was small pulmonary infarcts in the right upper and lower lobes.  Bilateral lower extremity venous Doppler study revealed acute DVT involving the SF junction and right common femoral vein with acute superficial vein thrombosis involving the right great saphenous vein, acute DVT involving the left common femoral, left femoral and left popliteal vein.  Echocardiogram revealed : 1. Severe RA/RV dilation with reduced RV function and severe pulmonary hypertension. LV hyperdynamic and borderline small cavity/underfilled. No hemodynamically significant valve disease. 2.  Left ventricular ejection fraction, by estimation, is 65 to 70%. The left ventricle has hyperdynamic function. The left ventricle has no regional wall motion abnormalities. There is moderate concentric left ventricular hypertrophy. Left ventricular diastolic function could not be evaluated. There is the interventricular septum is flattened in systole and diastole, consistent with right ventricular pressure and volume overload. 3. Right ventricular systolic function is moderately reduced. The right ventricular size is severely enlarged. There is severely elevated pulmonary artery systolic pressure. The estimated right ventricular systolic pressure is 76.2 mmHg. 4. Right atrial size was severely dilated. 5. The mitral valve is normal in structure and function. Trivial mitral valve regurgitation. No evidence of mitral stenosis. 6. Tricuspid valve regurgitation is moderate. 7. The aortic valve is tricuspid. Aortic valve regurgitation is trivial. No aortic stenosis is present. 8. Aortic dilatation noted. There is mild dilatation of the ascending aorta measuring 37 mm. 9. Mildly dilated pulmonary artery. 10. The inferior vena cava is dilated in size with <50% respiratory variability, suggesting right atrial pressure of 15 mmHg. 11. Cannot exclude IAS shunt.  Patient currently afebrile, BP 135/57, heart rate 69; WBC 10, hemoglobin 12.8, platelets 296k, creatinine 0.8, PT 15.4, INR 1.2, COVID-19 negative. On IV heparin.  Request now received from critical care service for PE thrombolytic therapy.  Past Medical History:  Diagnosis Date  . Aortic root dilatation (HCC) 02/21/2019  . Dyspnea on exertion   . Frequent unifocal PVCs 02/21/2019  . Glaucoma   . Gonorrhea    18 & 19 yrs of age; negative for syphillis, herpes and HIV  . Left inguinal hernia   . Pneumonia   . Ventricular premature beats   . Wears dentures  Past Surgical History:  Procedure Laterality Date  . COLONOSCOPY  07/2018    Benign polyps   . HIATAL HERNIA REPAIR      Allergies: Patient has no known allergies.  Medications: Prior to Admission medications   Medication Sig Start Date End Date Taking? Authorizing Provider  aspirin 81 MG tablet Take 81 mg by mouth daily.    Yes [provider]  dorzolamide (TRUSOPT) 2 % ophthalmic solution Place 1 drop into both eyes 2 (two) times daily. 08/27/19  Yes [provider]  Latanoprostene Bunod (VYZULTA) 0.024 % SOLN Apply 1 drop to eye daily.   Yes [provider]  metoprolol tartrate (LOPRESSOR) 50 MG tablet Take 1 tablet (50 mg total) by mouth 2 (two) times daily. 06/12/19  Yes Yates DecampGanji, Jay, MD  tamsulosin (FLOMAX) 0.4 MG CAPS capsule Take 0.4 mg by mouth at bedtime. 10/26/18  Yes [provider]  timolol (TIMOPTIC) 0.5 % ophthalmic solution Place 1 drop into both eyes 2 (two) times daily. 08/27/19  Yes [provider]     Family History  Problem Relation Age of Onset  . Diabetes Mother   . Cancer Father        colon  . Diabetes Sister   . Diabetes Brother     Social History   Socioeconomic History  . Marital status: Single    Spouse name: Not on file  . Number of children: 6  . Years of education: Not on file  . Highest education level: Not on file  Occupational History  . Not on file  Tobacco Use  . Smoking status: Former Smoker    Packs/day: 0.50    Years: 10.00    Pack years: 5.00    Types: Cigarettes    Quit date: 1970    Years since quitting: 51.1  . Smokeless tobacco: Never Used  Substance and Sexual Activity  . Alcohol use: Yes    Comment: occasional wine  . Drug use: No  . Sexual activity: Not on file  Other Topics Concern  . Not on file  Social History Narrative  . Not on file   Social Determinants of Health   Financial Resource Strain:   . Difficulty of Paying Living Expenses: Not on file  Food Insecurity:   . Worried About Programme researcher, broadcasting/film/videounning Out of Food in the Last Year: Not on file  . Ran  Out of Food in the Last Year: Not on file  Transportation Needs:   . Lack of Transportation (Medical): Not on file  . Lack of Transportation (Non-Medical): Not on file  Physical Activity:   . Days of Exercise per Week: Not on file  . Minutes of Exercise per Session: Not on file  Stress:   . Feeling of Stress : Not on file  Social Connections:   . Frequency of Communication with Friends and Family: Not on file  . Frequency of Social Gatherings with Friends and Family: Not on file  . Attends Religious Services: Not on file  . Active Member of Clubs or Organizations: Not on file  . Attends BankerClub or Organization Meetings: Not on file  . Marital Status: Not on file      Review of Systems see above  Vital Signs: BP (!) 135/57   Pulse 69   Temp 97.9 F (36.6 C) (Oral)   Resp (!) 24   Ht 6\' 3"  (1.905 m)   Wt 172 lb (78 kg)   SpO2 95%   BMI 21.50 kg/m  Physical Exam patient awake, alert.  In no acute distress; Chest clear to auscultation bilaterally.  Heart with normal rate, occasional PVC's, positive murmur.  Abdomen soft, positive bowel sounds, nontender.  No significant lower extremity edema  Imaging: CT Angio Chest PE W and/or Wo Contrast  Result Date: 08/30/2019 CLINICAL DATA:  Syncopal episode while using the bathroom. EXAM: CT ANGIOGRAPHY CHEST WITH CONTRAST TECHNIQUE: Multidetector CT imaging of the chest was performed using the standard protocol during bolus administration of intravenous contrast. Multiplanar CT image reconstructions and MIPs were obtained to evaluate the vascular anatomy. CONTRAST:  OMNIPAQUE IOHEXOL 350 MG/ML SOLN COMPARISON:  None. FINDINGS: Cardiovascular: Massive bilateral pulmonary arterial filling defects with near complete occlusion of the right pulmonary artery, no pulmonary arterial blood flow noted to the right lung. Large thrombus in the left lower lobe pulmonary artery which is partially occlusive. Marked right heart dilatation with contrast  refluxing into the hepatic veins and IVC. Elevated RV to LV ratio of 2.16. Aortic atherosclerosis. Cannot assess for dissection given phase of contrast. Mild dilatation of the aortic root at 3.6 cm without aortic aneurysm. No pericardial effusion. Mediastinum/Nodes: No adenopathy. No esophageal wall thickening. No suspicious thyroid nodule. Lungs/Pleura: Triangular subpleural opacities in the right upper and lower lobes likely pulmonary infarct. Diminished pulmonary vessels in the right lung related to pulmonary embolus. Subpleural opacities in the left lower lobe favor atelectasis. No pulmonary mass. No pulmonary edema. Upper Abdomen: Contrast refluxes into the hepatic veins and IVC. No other acute findings. Musculoskeletal: There are no acute or suspicious osseous abnormalities. Few bone islands in the right proximal humerus. Review of the MIP images confirms the above findings. IMPRESSION: 1. Massive pulmonary emboli with complete occlusion of the right main pulmonary artery and absent pulmonary arterial blood flow to the right lung. Large but partially occlusive filling defects in the left lower lobe pulmonary arteries. Significant right heart strain with RV to LV ratio of greater than 2, right heart dilatation and contrast refluxing into the hepatic veins and IVC. 2. Small pulmonary infarcts in the right upper and lower lobe. Critical Value/emergent results were called by telephone at the time of interpretation on 08/30/2019 at 6:24 am to Dr Drema Pry , who verbally acknowledged these results. Electronically Signed   By: Narda Rutherford M.D.   On: 08/30/2019 06:30   ECHOCARDIOGRAM COMPLETE  Result Date: 08/30/2019    ECHOCARDIOGRAM REPORT   Patient Name:   Joe Cantu Date of Exam: 08/30/2019 Medical Rec #:  [id number]     Height:       75.0 in Accession #:    [id number]    Weight:       172.0 lb Date of Birth:  Nov 12, 1950     BSA:          2.06 m Patient Age:    68 years      BP:           122/87 mmHg  Patient Gender: M             HR:           98 bpm. Exam Location:  Inpatient Procedure: 2D Echo, Cardiac Doppler and Color Doppler STAT ECHO Indications:    R55 Syncope  History:        Patient has no prior history of Echocardiogram examinations.                 Aortic Rood Dilatation.  Sonographer:    Advertising copywriter  Referring Phys: [id number] PEDRO EDUARDO CARDAMA IMPRESSIONS  1. Severe RA/RV dilation with reduced RV function and severe pulmonary hypertension. LV hyperdynamic and borderline small cavity/underfilled. No hemodynamically significant valve disease.  2. Left ventricular ejection fraction, by estimation, is 65 to 70%. The left ventricle has hyperdynamic function. The left ventricle has no regional wall motion abnormalities. There is moderate concentric left ventricular hypertrophy. Left ventricular diastolic function could not be evaluated. There is the interventricular septum is flattened in systole and diastole, consistent with right ventricular pressure and volume overload.  3. Right ventricular systolic function is moderately reduced. The right ventricular size is severely enlarged. There is severely elevated pulmonary artery systolic pressure. The estimated right ventricular systolic pressure is 76.2 mmHg.  4. Right atrial size was severely dilated.  5. The mitral valve is normal in structure and function. Trivial mitral valve regurgitation. No evidence of mitral stenosis.  6. Tricuspid valve regurgitation is moderate.  7. The aortic valve is tricuspid. Aortic valve regurgitation is trivial. No aortic stenosis is present.  8. Aortic dilatation noted. There is mild dilatation of the ascending aorta measuring 37 mm.  9. Mildly dilated pulmonary artery. 10. The inferior vena cava is dilated in size with <50% respiratory variability, suggesting right atrial pressure of 15 mmHg. 11. Cannot exclude IAS shunt. Comparison(s): No prior Echocardiogram. FINDINGS  Left Ventricle: Left ventricular ejection  fraction, by estimation, is 65 to 70%. The left ventricle has hyperdynamic function. The left ventricle has no regional wall motion abnormalities. The left ventricular internal cavity size was small. There is moderate concentric left ventricular hypertrophy. The interventricular septum is flattened in systole and diastole, consistent with right ventricular pressure and volume overload. Left ventricular diastolic function could not be evaluated. Right Ventricle: The right ventricular size is severely enlarged. Right vetricular wall thickness was not assessed. Right ventricular systolic function is moderately reduced. There is severely elevated pulmonary artery systolic pressure. The tricuspid regurgitant velocity is 3.91 m/s, and with an assumed right atrial pressure of 15 mmHg, the estimated right ventricular systolic pressure is 76.2 mmHg. Left Atrium: Left atrial size was normal in size. Right Atrium: Right atrial size was severely dilated. Pericardium: There is no evidence of pericardial effusion. Mitral Valve: The mitral valve is normal in structure and function. Trivial mitral valve regurgitation. No evidence of mitral valve stenosis. Tricuspid Valve: The tricuspid valve is normal in structure. Tricuspid valve regurgitation is moderate . No evidence of tricuspid stenosis. Aortic Valve: The aortic valve is tricuspid. Aortic valve regurgitation is trivial. No aortic stenosis is present. Pulmonic Valve: The pulmonic valve was grossly normal. Pulmonic valve regurgitation is mild. No evidence of pulmonic stenosis. Aorta: Aortic dilatation noted. There is mild dilatation of the ascending aorta measuring 37 mm. Pulmonary Artery: The pulmonary artery is mildly dilated. Venous: The inferior vena cava is dilated in size with less than 50% respiratory variability, suggesting right atrial pressure of 15 mmHg. IAS/Shunts: Cannot exclude IAS shunt. Additional Comments: There is a small pleural effusion in both left and right  lateral regions.  LEFT VENTRICLE PLAX 2D LVIDd:         3.33 cm LVIDs:         2.20 cm LV PW:         1.75 cm LV IVS:        2.02 cm LVOT diam:     2.40 cm LV SV:         72.61 ml LV SV Index:   14.28 LVOT Area:  4.52 cm  RIGHT VENTRICLE          IVC RV Basal diam:  4.20 cm  IVC diam: 2.20 cm RV Mid diam:    3.00 cm TAPSE (M-mode): 1.4 cm LEFT ATRIUM             Index       RIGHT ATRIUM           Index LA diam:        3.40 cm 1.65 cm/m  RA Area:     39.50 cm LA Vol (A2C):   42.1 ml 20.46 ml/m RA Volume:   182.50 ml 88.67 ml/m LA Vol (A4C):   43.0 ml 20.89 ml/m LA Biplane Vol: 42.4 ml 20.60 ml/m  AORTIC VALVE LVOT Vmax:   102.40 cm/s LVOT Vmean:  57.500 cm/s LVOT VTI:    0.160 m  AORTA Ao Root diam: 4.20 cm Ao Asc diam:  3.70 cm MV A velocity: 78.45 cm/s  TRICUSPID VALVE                            TR Peak grad:   61.2 mmHg                            TR Vmax:        391.00 cm/s                             SHUNTS                            Systemic VTI:  0.16 m                            Systemic Diam: 2.40 cm Jodelle Red MD Electronically signed by Jodelle Red MD Signature Date/Time: 08/30/2019/12:48:37 PM    Final    VAS [place] LOWER EXTREMITY VENOUS (DVT)  Result Date: 08/30/2019  Lower Venous DVTStudy Indications: Pulmonary embolism, and SOB.  Comparison Study: No prior exam. Performing Technologist: Kennedy Bucker ARDMS, RVT  Examination Guidelines: A complete evaluation includes B-mode imaging, spectral Doppler, color Doppler, and power Doppler as needed of all accessible portions of each vessel. Bilateral testing is considered an integral part of a complete examination. Limited examinations for reoccurring indications may be performed as noted. The reflux portion of the exam is performed with the patient in reverse Trendelenburg.  +---------+---------------+---------+-----------+----------+---------------+ RIGHT    CompressibilityPhasicitySpontaneityPropertiesThrombus Aging   +---------+---------------+---------+-----------+----------+---------------+ CFV      Partial        Yes      Yes                                  +---------+---------------+---------+-----------+----------+---------------+ SFJ      None                                                         +---------+---------------+---------+-----------+----------+---------------+ FV Prox  Full                                                         +---------+---------------+---------+-----------+----------+---------------+  FV Mid   Full                                                         +---------+---------------+---------+-----------+----------+---------------+ FV DistalFull                                                         +---------+---------------+---------+-----------+----------+---------------+ PFV      Full                                                         +---------+---------------+---------+-----------+----------+---------------+ POP      Full           Yes      Yes                                  +---------+---------------+---------+-----------+----------+---------------+ PTV      Full                                         seen with color +---------+---------------+---------+-----------+----------+---------------+ PERO     Full                                         seen with color +---------+---------------+---------+-----------+----------+---------------+ EIV      Full           Yes      Yes                                  +---------+---------------+---------+-----------+----------+---------------+   +---------+---------------+---------+-----------+----------+---------------+ LEFT     CompressibilityPhasicitySpontaneityPropertiesThrombus Aging  +---------+---------------+---------+-----------+----------+---------------+ CFV      None           No       No                                    +---------+---------------+---------+-----------+----------+---------------+ SFJ      Full                                                         +---------+---------------+---------+-----------+----------+---------------+ FV Prox  Full                                                         +---------+---------------+---------+-----------+----------+---------------+  FV Mid   Full                                                         +---------+---------------+---------+-----------+----------+---------------+ FV DistalNone                                                         +---------+---------------+---------+-----------+----------+---------------+ PFV      Full                                                         +---------+---------------+---------+-----------+----------+---------------+ POP      None           No       No                                   +---------+---------------+---------+-----------+----------+---------------+ PTV      Full                                         seen with color +---------+---------------+---------+-----------+----------+---------------+ PERO     Full                                         seen with color +---------+---------------+---------+-----------+----------+---------------+ EIV      Full                                                         +---------+---------------+---------+-----------+----------+---------------+ Poorly visusalized bilteral calf veins, visualized with color flow.    Summary: RIGHT: - Findings consistent with acute deep vein thrombosis involving the SF junction, and right common femoral vein. - Findings consistent with acute superficial vein thrombosis involving the right great saphenous vein.  LEFT: - Findings consistent with acute deep vein thrombosis involving the left common femoral vein, left femoral vein, and left popliteal vein.  *See table(s) above for measurements  and observations.    Preliminary     Labs:  CBC: Recent Labs    08/30/19 0445 08/30/19 0453  WBC 10.0  --   HGB 12.8* 12.9*  HCT 38.3* 38.0*  PLT 296  --     COAGS: Recent Labs    08/30/19 0656  INR 1.2  APTT 28    BMP: Recent Labs    08/30/19 0445 08/30/19 0453  NA 139 140  K 4.4 4.3  CL 108 107  CO2 22  --   GLUCOSE 130* 123*  BUN 21 18  CALCIUM 10.5*  --   CREATININE 0.83 0.80  GFRNONAA >60  --  GFRAA >60  --     LIVER FUNCTION TESTS: Recent Labs    08/30/19 0445  BILITOT 0.9  AST 87*  ALT 79*  ALKPHOS 89  PROT 7.6  ALBUMIN 3.5    TUMOR MARKERS: No results for input(s): AFPTM, CEA, CA199, CHROMGRNA in the last 8760 hours.  Assessment and Plan: 69 y.o. male , remote smoker, with prior medical history significant for PVCs and aortic root dilatation.  [facility] following syncopal episode at home this morning while urinating.  EMS was contacted by fianc.  Patient relates to history of some dyspnea with exertion and occasional cough.  He denies fever, headache, chest pain, abdominal/back pain, nausea, vomiting or bleeding.  No recent history of surgery, cancer or trauma.  No known Covid exposures. Pt states mother had a blood clot and stroke many years ago.  CT angio of chest today revealed massive pulmonary emboli with complete occlusion of the right main pulmonary artery and absent pulmonary arterial blood flow to the right lung.  There is large but partially occlusive filling defects in the left lower lobe pulmonary arteries, significant right heart strain with RV to LV ratio greater than 2, right heart dilatation and contrast refluxing into the hepatic veins and IVC.  Also noted was small pulmonary infarcts in the right upper and lower lobes.  Bilateral lower extremity venous Doppler study revealed acute DVT involving the SF junction and right common femoral vein with acute superficial vein thrombosis involving the  right great saphenous vein, acute DVT involving the left common femoral, left femoral and left popliteal vein.  Echocardiogram revealed : 1. Severe RA/RV dilation with reduced RV function and severe pulmonary hypertension. LV hyperdynamic and borderline small cavity/underfilled. No hemodynamically significant valve disease. 2. Left ventricular ejection fraction, by estimation, is 65 to 70%. The left ventricle has hyperdynamic function. The left ventricle has no regional wall motion abnormalities. There is moderate concentric left ventricular hypertrophy. Left ventricular diastolic function could not be evaluated. There is the interventricular septum is flattened in systole and diastole, consistent with right ventricular pressure and volume overload. 3. Right ventricular systolic function is moderately reduced. The right ventricular size is severely enlarged. There is severely elevated pulmonary artery systolic pressure. The estimated right ventricular systolic pressure is 76.2 mmHg. 4. Right atrial size was severely dilated. 5. The mitral valve is normal in structure and function. Trivial mitral valve regurgitation. No evidence of mitral stenosis. 6. Tricuspid valve regurgitation is moderate. 7. The aortic valve is tricuspid. Aortic valve regurgitation is trivial. No aortic stenosis is present. 8. Aortic dilatation noted. There is mild dilatation of the ascending aorta measuring 37 mm. 9. Mildly dilated pulmonary artery. 10. The inferior vena cava is dilated in size with <50% respiratory variability, suggesting right atrial pressure of 15 mmHg. 11. Cannot exclude IAS shunt.  Patient currently afebrile, BP 135/57, heart rate 69; WBC 10, hemoglobin 12.8, platelets 296k, creatinine 0.8, PT 15.4, INR 1.2, COVID-19 negative. On IV heparin.  Request now received from critical care service for PE thrombolytic therapy.  Case/imaging studies have been reviewed by Drs. Watts.   Details/risks of  procedure, including but not limited to, internal bleeding, infection, renal dysfunction, injury to adjacent structures, inability to completely lyse clot discussed with patient with his understanding and consent.  This procedure involves the use of X-rays and because of the nature of the planned procedure, it is possible that we will have prolonged use of X-ray fluoroscopy.  Potential radiation risks to you include (but are not limited to) the following: - A slightly elevated risk for cancer  several years later in life. This risk is typically less than 0.5% percent. This risk is low in comparison to the normal incidence of human cancer, which is 33% for women and 50% for men according to the [employer]. - Radiation induced injury can include skin redness, resembling a rash, tissue breakdown / ulcers and hair loss (which can be temporary or permanent).   The likelihood of either of these occurring depends on the difficulty of the procedure and whether you are sensitive to radiation due to previous procedures, disease, or genetic conditions.   IF your procedure requires a prolonged use of radiation, you will be notified and given written instructions for further action.  It is your responsibility to monitor the irradiated area for the 2 weeks following the procedure and to notify your physician if you are concerned that you have suffered a radiation induced injury.       Thank you for this interesting consult.  I greatly enjoyed meeting Joe Cantu and look forward to participating in their care.  A copy of this report was sent to the requesting provider on this date.  Electronically Signed: D. Jeananne Rama, PA-C 08/30/2019, 2:14 PM   I spent a total of 40 minutes    in face to face in clinical consultation, greater than 50% of which was counseling/coordinating care for pulmonary arteriogram with catheter directed thrombolytic therapy of pulmonary emboli

## 2019-08-30 NOTE — ED Triage Notes (Signed)
Patient is from home and transported via [facility]. Patient had a syncopal episode while on the toilet. He reports he has felt weak and is dehydrated. Also, he had his flu vaccine on Monday. Patient is alert, oriented x 4. During transport, EMS administered NS.

## 2019-08-30 NOTE — Progress Notes (Signed)
eLink Physician-Brief Progress Note Patient Name: Joe Cantu DOB: 1950-11-21 MRN: [id number]   Date of Service  08/30/2019  HPI/Events of Note  Pt bleeding from femoral venipuncture sites, he is on heparin and TPA infusions for massive hemodynamically significant PE.  eICU Interventions  Thrombin pads + gauze dressings + 5 lb sand bag to the affected site, stat H & H, heparin level, type & screen.        Thomasene Lot Nguyen Butler 08/30/2019, 11:02 PM

## 2019-08-30 NOTE — H&P (Addendum)
History and Physical    Joe Cantu WUX:[id number] DOB: 03-24-1951 DOA: 08/30/2019  PCP: [name], MD Patient coming from: Home  Chief Complaint: Syncope  HPI: Joe Cantu is a 69 y.o. male with medical history significant of frequent PVCs and aortic root dilation. Patient reported passing out this morning around 2:00 AM after urinating. He reports feeling weak to the point of easing down to the ground. He reports no one witnessed his syncopal episode. He is unsure of how long he was down and he awoke to the paramedics arriving. He reports that his fiance found him. He has some dyspnea on exertion but is comfortable at rest. No chest pain. Patient reports his mother had a blood clot with associated stroke. No other risk factors noted.  ED Course: Vitals: Afebrile, pulse of 80, respirations of 16-20, normotensive, appears SPO2 was down to a low of 70% and is currently at 99% on 2 L Labs: Hemoglobin of 12.8, troponin of 59 -> 62 Imaging: CTA of the chest was significant for a massive PE with complete occlusion of the right main artery with absent arterial blood flow to right lung in addition to partially occlusive filling defects of left lower lobe pulmonary arteries; also evidence of significant right heart strain with associated right heart dilation Medications/Course: Heparin drip, normal saline bolus initiated continuous  Review of Systems: Review of Systems  Constitutional: Negative for chills, diaphoresis, fever and malaise/fatigue.  Respiratory: Positive for shortness of breath.   Cardiovascular: Negative for chest pain and palpitations.  Gastrointestinal: Negative for abdominal pain, constipation, diarrhea, nausea and vomiting.  Neurological: Positive for loss of consciousness. Negative for dizziness.  All other systems reviewed and are negative.   Past Medical History:  Diagnosis Date  . Aortic root dilatation (HCC) 02/21/2019  . Dyspnea on exertion   . Frequent unifocal  PVCs 02/21/2019  . Glaucoma   . Gonorrhea    18 & 19 yrs of age; negative for syphillis, herpes and HIV  . Left inguinal hernia   . Pneumonia   . Ventricular premature beats   . Wears dentures     Past Surgical History:  Procedure Laterality Date  . COLONOSCOPY  07/2018   Benign polyps   . HIATAL HERNIA REPAIR       reports that he quit smoking about 51 years ago. His smoking use included cigarettes. He has a 5.00 pack-year smoking history. He has never used smokeless tobacco. He reports current alcohol use. He reports that he does not use drugs.  No Known Allergies  Family History  Problem Relation Age of Onset  . Diabetes Mother   . Cancer Father        colon  . Diabetes Sister   . Diabetes Brother     Prior to Admission medications   Medication Sig Start Date End Date Taking? Authorizing Provider  aspirin 81 MG tablet Take 81 mg by mouth daily.    Yes [provider]  dorzolamide (TRUSOPT) 2 % ophthalmic solution Place 1 drop into both eyes 2 (two) times daily. 08/27/19  Yes [provider]  Latanoprostene Bunod (VYZULTA) 0.024 % SOLN Apply 1 drop to eye daily.   Yes [provider]  metoprolol tartrate (LOPRESSOR) 50 MG tablet Take 1 tablet (50 mg total) by mouth 2 (two) times daily. 06/12/19  Yes Yates Decamp, MD  tamsulosin (FLOMAX) 0.4 MG CAPS capsule Take 0.4 mg by mouth at bedtime. 10/26/18  Yes [provider]  timolol (TIMOPTIC)  0.5 % ophthalmic solution Place 1 drop into both eyes 2 (two) times daily. 08/27/19  Yes [provider]    Physical Exam:  Physical Exam Constitutional:      General: He is not in acute distress.    Appearance: He is well-developed. He is not diaphoretic.  HENT:     Mouth/Throat:     Dentition: Abnormal dentition.  Eyes:     Conjunctiva/sclera: Conjunctivae normal.     Pupils: Pupils are equal, round, and reactive to light.  Cardiovascular:     Rate and Rhythm: Regular rhythm. Tachycardia  present. Occasional extrasystoles (PVC) are present.    Pulses: Normal pulses.     Heart sounds: Murmur (loudest at lower sternal border and apex) present. Systolic (holosystolic) murmur present with a grade of 2/6.  Pulmonary:     Effort: Pulmonary effort is normal. No tachypnea, accessory muscle usage or respiratory distress.     Breath sounds: Rales (mild) present. No wheezing or rhonchi.  Abdominal:     General: Bowel sounds are normal. There is no distension.     Palpations: Abdomen is soft.     Tenderness: There is no abdominal tenderness. There is no guarding or rebound.  Musculoskeletal:        General: No tenderness. Normal range of motion.     Cervical back: Normal range of motion.     Right lower leg: No edema.     Left lower leg: No edema.  Lymphadenopathy:     Cervical: No cervical adenopathy.  Skin:    General: Skin is warm and dry.  Neurological:     Mental Status: He is alert and oriented to person, place, and time.     Labs on Admission: I have personally reviewed following labs and imaging studies  CBC: Recent Labs  Lab 08/30/19 0445 08/30/19 0453  WBC 10.0  --   NEUTROABS 7.8*  --   HGB 12.8* 12.9*  HCT 38.3* 38.0*  MCV 93.4  --   PLT 296  --     Basic Metabolic Panel: Recent Labs  Lab 08/30/19 0445 08/30/19 0453  NA 139 140  K 4.4 4.3  CL 108 107  CO2 22  --   GLUCOSE 130* 123*  BUN 21 18  CREATININE 0.83 0.80  CALCIUM 10.5*  --     GFR: Estimated Creatinine Clearance: 100.2 mL/min (by C-G formula based on SCr of 0.8 mg/dL).  Liver Function Tests: Recent Labs  Lab 08/30/19 0445  AST 87*  ALT 79*  ALKPHOS 89  BILITOT 0.9  PROT 7.6  ALBUMIN 3.5   No results for input(s): LIPASE, AMYLASE in the last 168 hours. No results for input(s): AMMONIA in the last 168 hours.  Coagulation Profile: Recent Labs  Lab 08/30/19 0656  INR 1.2    Cardiac Enzymes: No results for input(s): CKTOTAL, CKMB, CKMBINDEX, TROPONINI in the last 168  hours.  BNP (last 3 results) No results for input(s): PROBNP in the last 8760 hours.  HbA1C: No results for input(s): HGBA1C in the last 72 hours.  CBG: Recent Labs  Lab 08/30/19 0422  GLUCAP 112*    Lipid Profile: No results for input(s): CHOL, HDL, LDLCALC, TRIG, CHOLHDL, LDLDIRECT in the last 72 hours.  Thyroid Function Tests: No results for input(s): TSH, T4TOTAL, FREET4, T3FREE, THYROIDAB in the last 72 hours.  Anemia Panel: No results for input(s): VITAMINB12, FOLATE, FERRITIN, TIBC, IRON, RETICCTPCT in the last 72 hours.  Urine analysis: No results found for:  COLORURINE, APPEARANCEUR, LABSPEC, PHURINE, GLUCOSEU, HGBUR, BILIRUBINUR, KETONESUR, PROTEINUR, UROBILINOGEN, NITRITE, LEUKOCYTESUR   Radiological Exams on Admission: CT Angio Chest PE W and/or Wo Contrast  Result Date: 08/30/2019 CLINICAL DATA:  Syncopal episode while using the bathroom. EXAM: CT ANGIOGRAPHY CHEST WITH CONTRAST TECHNIQUE: Multidetector CT imaging of the chest was performed using the standard protocol during bolus administration of intravenous contrast. Multiplanar CT image reconstructions and MIPs were obtained to evaluate the vascular anatomy. CONTRAST:  139mL OMNIPAQUE IOHEXOL 350 MG/ML SOLN COMPARISON:  None. FINDINGS: Cardiovascular: Massive bilateral pulmonary arterial filling defects with near complete occlusion of the right pulmonary artery, no pulmonary arterial blood flow noted to the right lung. Large thrombus in the left lower lobe pulmonary artery which is partially occlusive. Marked right heart dilatation with contrast refluxing into the hepatic veins and IVC. Elevated RV to LV ratio of 2.16. Aortic atherosclerosis. Cannot assess for dissection given phase of contrast. Mild dilatation of the aortic root at 3.6 cm without aortic aneurysm. No pericardial effusion. Mediastinum/Nodes: No adenopathy. No esophageal wall thickening. No suspicious thyroid nodule. Lungs/Pleura: Triangular subpleural  opacities in the right upper and lower lobes likely pulmonary infarct. Diminished pulmonary vessels in the right lung related to pulmonary embolus. Subpleural opacities in the left lower lobe favor atelectasis. No pulmonary mass. No pulmonary edema. Upper Abdomen: Contrast refluxes into the hepatic veins and IVC. No other acute findings. Musculoskeletal: There are no acute or suspicious osseous abnormalities. Few bone islands in the right proximal humerus. Review of the MIP images confirms the above findings. IMPRESSION: 1. Massive pulmonary emboli with complete occlusion of the right main pulmonary artery and absent pulmonary arterial blood flow to the right lung. Large but partially occlusive filling defects in the left lower lobe pulmonary arteries. Significant right heart strain with RV to LV ratio of greater than 2, right heart dilatation and contrast refluxing into the hepatic veins and IVC. 2. Small pulmonary infarcts in the right upper and lower lobe. Critical Value/emergent results were called by telephone at the time of interpretation on 08/30/2019 at 6:24 am to Dr Addison Lank , who verbally acknowledged these results. Electronically Signed   By: Keith Rake M.D.   On: 08/30/2019 06:30    EKG: Independently reviewed. PVCs noted. T-wave changes stable.  Assessment/Plan Active Problems:   Acute pulmonary embolism (HCC)  Acute pulmonary embolism Massive PE per CT scan read. Critical care consulted by EDP on admission with recommendations for general medicine admission in addition to heart ultrasound to assess right heart strain. Patient is currently hemodynamically stable and on 2 L via [place]. Dyspnea only with exertion.  Unsure of cause.  No history of Covid infection or exposure. -Continue heparin drip -Continue oxygen via nasal cannula to keep O2 saturations greater than 90% -Echocardiogram results pending -Lower extremity venous duplex  Syncope Likely related to Acute PE and resultant  hypoxia. Transthoracic Echocardiogram ordered as mentioned above. - Watch telemetry for arrhythmia -Transthoracic Echocardiogram   Frequent PVCs Patient follows with Dr. Einar Gip as an outpatient.  Patient is on metoprolol tartrate 50 mg twice daily.  PVCs noted on telemetry. -Continue metoprolol 50 mg twice daily   DVT prophylaxis: Heparin drip Code Status: Full code Family Communication: None at bedside Disposition Plan: Admit to stepdown Consults called: PCCM via EDP but currently not consultants Admission status: Inpatient   Cordelia Poche, MD [facility] 08/30/2019, 7:49 AM

## 2019-08-30 NOTE — Progress Notes (Signed)
Bilateral lower extremity venous duplex exam completed.  Preliminary results can be found under CV proc under chart review.  Preliminary results given to [facility], RN and messaged preliminary results to Dr. Caleb Popp.  08/30/2019 10:15 AM  Cayley Pester, K., RDMS, RVT

## 2019-08-31 ENCOUNTER — Ambulatory Visit: Payer: BC Managed Care – PPO

## 2019-08-31 ENCOUNTER — Inpatient Hospital Stay (HOSPITAL_COMMUNITY): Payer: BC Managed Care – PPO

## 2019-08-31 HISTORY — PX: IR THROMB F/U EVAL ART/VEN FINAL DAY (MS): IMG5379

## 2019-08-31 HISTORY — PX: IR IVC FILTER PLMT / S&I /IMG GUID/MOD SED: IMG701

## 2019-08-31 LAB — CBC
HCT: 32.8 % — ABNORMAL LOW (ref 39.0–52.0)
HCT: 36.6 % — ABNORMAL LOW (ref 39.0–52.0)
Hemoglobin: 11 g/dL — ABNORMAL LOW (ref 13.0–17.0)
Hemoglobin: 12.2 g/dL — ABNORMAL LOW (ref 13.0–17.0)
MCH: 31.4 pg (ref 26.0–34.0)
MCH: 31.7 pg (ref 26.0–34.0)
MCHC: 33.3 g/dL (ref 30.0–36.0)
MCHC: 33.5 g/dL (ref 30.0–36.0)
MCV: 93.7 fL (ref 80.0–100.0)
MCV: 95.1 fL (ref 80.0–100.0)
Platelets: 254 10*3/uL (ref 150–400)
Platelets: 263 10*3/uL (ref 150–400)
RBC: 3.5 MIL/uL — ABNORMAL LOW (ref 4.22–5.81)
RBC: 3.85 MIL/uL — ABNORMAL LOW (ref 4.22–5.81)
RDW: 13.2 % (ref 11.5–15.5)
RDW: 13.2 % (ref 11.5–15.5)
WBC: 10.5 10*3/uL (ref 4.0–10.5)
WBC: 12.2 10*3/uL — ABNORMAL HIGH (ref 4.0–10.5)
nRBC: 0 % (ref 0.0–0.2)
nRBC: 0 % (ref 0.0–0.2)

## 2019-08-31 LAB — ANTITHROMBIN III: AntiThromb III Func: 83 % (ref 75–120)

## 2019-08-31 LAB — COMPREHENSIVE METABOLIC PANEL
ALT: 73 U/L — ABNORMAL HIGH (ref 0–44)
AST: 57 U/L — ABNORMAL HIGH (ref 15–41)
Albumin: 2.7 g/dL — ABNORMAL LOW (ref 3.5–5.0)
Alkaline Phosphatase: 76 U/L (ref 38–126)
Anion gap: 9 (ref 5–15)
BUN: 24 mg/dL — ABNORMAL HIGH (ref 8–23)
CO2: 18 mmol/L — ABNORMAL LOW (ref 22–32)
Calcium: 9.2 mg/dL (ref 8.9–10.3)
Chloride: 113 mmol/L — ABNORMAL HIGH (ref 98–111)
Creatinine, Ser: 0.84 mg/dL (ref 0.61–1.24)
GFR calc Af Amer: >60 mL/min (ref >60)
GFR calc non Af Amer: >60 mL/min (ref >60)
Glucose, Bld: 119 mg/dL — ABNORMAL HIGH (ref 70–99)
Potassium: 4.2 mmol/L (ref 3.5–5.1)
Sodium: 140 mmol/L (ref 135–145)
Total Bilirubin: 1.3 mg/dL — ABNORMAL HIGH (ref 0.3–1.2)
Total Protein: 5.9 g/dL — ABNORMAL LOW (ref 6.5–8.1)

## 2019-08-31 LAB — HEPARIN LEVEL (UNFRACTIONATED)
Heparin Unfractionated: 0.55 IU/mL (ref 0.30–0.70)
Heparin Unfractionated: 0.48 IU/mL (ref 0.30–0.70)

## 2019-08-31 LAB — FIBRINOGEN: Fibrinogen: 262 mg/dL (ref 210–475)

## 2019-08-31 MED ORDER — PHENOL 1.4 % MT LIQD
1.0000 | OROMUCOSAL | Status: DC | PRN
  Administered 2019-08-31: 1 via OROMUCOSAL
  Filled 2019-08-31: qty 177

## 2019-08-31 MED ORDER — FENTANYL CITRATE (PF) 100 MCG/2ML IJ SOLN
INTRAMUSCULAR | Status: AC
  Filled 2019-08-31: qty 2

## 2019-08-31 MED ORDER — IOHEXOL 300 MG/ML  SOLN
50.0000 mL | Freq: Once | INTRAMUSCULAR | Status: AC | PRN
  Administered 2019-08-31: 24 mL via INTRAVENOUS

## 2019-08-31 MED ORDER — IOHEXOL 300 MG/ML  SOLN
50.0000 mL | Freq: Once | INTRAMUSCULAR | Status: AC | PRN
  Administered 2019-08-31: 10 mL via INTRAVENOUS

## 2019-08-31 MED ORDER — LACTATED RINGERS IV BOLUS: 250.0000 mL | Freq: Once | INTRAVENOUS | Status: DC

## 2019-08-31 MED ORDER — LIDOCAINE HCL 1 % IJ SOLN
INTRAMUSCULAR | Status: AC
  Filled 2019-08-31: qty 20

## 2019-08-31 MED ORDER — FENTANYL CITRATE (PF) 100 MCG/2ML IJ SOLN
INTRAMUSCULAR | Status: AC | PRN
  Administered 2019-08-31: 50 ug via INTRAVENOUS

## 2019-08-31 NOTE — Progress Notes (Deleted)
eLink Physician-Brief Progress Note Patient Name: Joe Cantu DOB: August 25, 1950 MRN: [id number]   Date of Service  08/31/2019  HPI/Events of Note  Hypotension, oliguria and sinus tachycardia, ? Volume depletion s/p diuresis with Lasix.  eICU Interventions  Lactated ringers 250 ml iv fluid bolus x 1.        Thomasene Lot Joe Cantu 08/31/2019, 4:54 AM

## 2019-08-31 NOTE — Plan of Care (Signed)
Pt progressing to meet his goals of care

## 2019-08-31 NOTE — Progress Notes (Signed)
Serous sang drainage on dressing.  Sandbag remains in place.  Dr. Warrick Parisian aware.  Will continue to monitor closely

## 2019-08-31 NOTE — Progress Notes (Signed)
Pt noted to have bleeding at left femoral venous sheath sites.  Pressure applied.  Labs drawn, Dr. Warrick Parisian notified and orders for thrombo pad and sand bag applied to site.  Will monitor closely.

## 2019-08-31 NOTE — Progress Notes (Signed)
Okay'd with IR PA to stick patient for labwork now that the sheath is out and TPA has been stopped since 0430 this AM.

## 2019-08-31 NOTE — Progress Notes (Signed)
Dr. Warrick Parisian notified of pts left femoral venous sheath site.  Gauze 4 x 4 have serous sanguinous, but drainage is much less.  Orders to continue to monitor closely.  Sand bag still remains on leg.  Leg straight.

## 2019-08-31 NOTE — Progress Notes (Addendum)
NAME:  Joe Cantu, MRN:  [id number], DOB:  Nov 08, 1950, LOS: 1 ADMISSION DATE:  08/30/2019, CONSULTATION DATE:  08/29/2018 REFERRING MD: Jeral Pinch MD , CHIEF COMPLAINT: Submassive PE  Brief History   69 year old with history of PVCs, aortic root dilatation.  Admitted with syncopal episode today morning while urinating.  EMS called by fianc. He had mild hypotension in the ED which responded to fluids.  Patient had been feeling short of breath for 2 weeks prior to admission.  Denies any immobility, surgery, trauma.  No recent travel.  Reports that his mother had a blood clot and stroke many years ago.  Work-up shows significant bilateral PE with right heart strain.  PCCM consulted for recommendations.  Past Medical History    has a past medical history of Aortic root dilatation ([place]) (02/21/2019), Dyspnea on exertion, Frequent unifocal PVCs (02/21/2019), Glaucoma, Gonorrhea, Left inguinal hernia, Pneumonia, Ventricular premature beats, and Wears dentures.  Significant Hospital Events   2/17- Admit  Consults:  PCCM, IR  Procedures:    Significant Diagnostic Tests:  CTA 08/30/2019-bilateral pulmonary emboli with near complete occlusion of the right pulmonary artery  Echo 08/30/2019-severe RA/RV dilatation with severe pulmonary hypertension, LVEF 65-70%  Lower extremity doppler 2/17 >  RIGHT:  - Findings consistent with acute deep vein thrombosis involving the SF  junction, and right common femoral vein.  - Findings consistent with acute superficial vein thrombosis involving the  right great saphenous vein.     LEFT:  - Findings consistent with acute deep vein thrombosis involving the left  common femoral vein, left femoral vein, and left popliteal vein.   Micro Data:  COVID 2/17 >> negative  MRSA PCR 2/17 >> negative   Antimicrobials:    Interim history/subjective:  Lying in bed in no acute distress states dyspnea is greatly improved.   Objective   Blood pressure (!)  148/94, pulse 61, temperature 99.4 F (37.4 C), temperature source Axillary, resp. rate (!) 21, height 6\' 3"  (1.905 m), weight 78 kg, SpO2 97 %.        Intake/Output Summary (Last 24 hours) at 08/31/2019 6789 Last data filed at 08/31/2019 0750 Gross per 24 hour  Intake 4045.12 ml  Output 400 ml  Net 3645.12 ml   Filed Weights   08/30/19 0411  Weight: 78 kg    Examination: General: Thin adult male lying in bed in no acute distress  HEENT: [place]/AT, MM pink/moist, PERRL,  Neuro: Alert and oriented x3, non-focal  CV: s1s2 regular rate and rhythm, no murmur, rubs, or gallops,  PULM:  Clear to ascultation bilaterally, no increased work of breathing, no added breath sounds oxygen saturations 2-97% on 3L [place]  GI: soft, bowel sounds active in all 4 quadrants, non-tender, non-distended Extremities: warm/dry,thrombin pad to right groin with bloody drainage see, sand bag in place, palpable pedal pulses.  no edema  Skin: no rashes or lesions  Resolved Hospital Problem list     Assessment & Plan:  Acute submassive PE Bilateral DVT -On admission patient was seen with significant RV dysfunction as seen on echocardiogram, elevated BNP and trop. Although he was hemodynamically stable at present he has transient desats and is symptomatic on minimal exertion -Some issues overnight with bleeding at catheter sites  P: Underwent catheter directed thrombolytics 2/17 Wean supplemental oxygen  IR recommending placement of IVCF today due to acute bilateral DVT NPO prior to procedure  Continue IV heparin Monitor for signs of recurrent bleeding   Best practice:  Diet: NPO  Pain/Anxiety/Delirium protocol (if indicated):NA VAP protocol (if indicated): NA DVT prophylaxis: Heparin GI prophylaxis: NA Glucose control: Monitor Mobility: Bed Code Status: Full Family Communication: Patient updated Disposition: ICU  Labs   CBC: Recent Labs  Lab 08/30/19 0445 08/30/19 0453 08/31/19 0019 08/31/19 0430   WBC 10.0  --  12.2* 10.5  NEUTROABS 7.8*  --   --   --   HGB 12.8* 12.9* 12.2* 11.0*  HCT 38.3* 38.0* 36.6* 32.8*  MCV 93.4  --  95.1 93.7  PLT 296  --  254 263    Basic Metabolic Panel: Recent Labs  Lab 08/30/19 0445 08/30/19 0453 08/31/19 0430  NA 139 140 140  K 4.4 4.3 4.2  CL 108 107 113*  CO2 22  --  18*  GLUCOSE 130* 123* 119*  BUN 21 18 24*  CREATININE 0.83 0.80 0.84  CALCIUM 10.5*  --  9.2   GFR: Estimated Creatinine Clearance: 95.4 mL/min (by C-G formula based on SCr of 0.84 mg/dL). Recent Labs  Lab 08/30/19 0445 08/31/19 0019 08/31/19 0430  WBC 10.0 12.2* 10.5    Liver Function Tests: Recent Labs  Lab 08/30/19 0445 08/31/19 0430  AST 87* 57*  ALT 79* 73*  ALKPHOS 89 76  BILITOT 0.9 1.3*  PROT 7.6 5.9*  ALBUMIN 3.5 2.7*   No results for input(s): LIPASE, AMYLASE in the last 168 hours. No results for input(s): AMMONIA in the last 168 hours.  ABG    Component Value Date/Time   TCO2 22 08/30/2019 0453     Coagulation Profile: Recent Labs  Lab 08/30/19 0656  INR 1.2    Cardiac Enzymes: No results for input(s): CKTOTAL, CKMB, CKMBINDEX, TROPONINI in the last 168 hours.  HbA1C: No results found for: HGBA1C  CBG: Recent Labs  Lab 08/30/19 0422  GLUCAP 112*    Signature:   Delfin Gant, NP-C [facility] Contact / Pager information can be found on Amion  08/31/2019, 9:50 AM

## 2019-08-31 NOTE — Progress Notes (Signed)
Referring Physician(s): Mannam, Praveen  Supervising Physician: Aletta Edouard  Patient Status:  Joe Cantu  Chief Complaint: None  Subjective:  History of submassive PE s/p bilateral catheter directed pulmonary artery thrombolysis in IR 08/30/2019 by Dr. Pascal Lux. Patient awake and alert laying in bed with no complaints. Denies dyspnea. Left groin incision stable, mild oozing noted on dressings, with catheters intact- per RN dressings became saturated last evening, requiring a few dressing changes per shift. States sandbag applied per CCM.   Allergies: Patient has no known allergies.  Medications: Prior to Admission medications   Medication Sig Start Date End Date Taking? Authorizing Provider  aspirin 81 MG tablet Take 81 mg by mouth daily.    Yes [provider]  dorzolamide (TRUSOPT) 2 % ophthalmic solution Place 1 drop into both eyes 2 (two) times daily. 08/27/19  Yes [provider]  Latanoprostene Bunod (VYZULTA) 0.024 % SOLN Apply 1 drop to eye daily.   Yes [provider]  metoprolol tartrate (LOPRESSOR) 50 MG tablet Take 1 tablet (50 mg total) by mouth 2 (two) times daily. 06/12/19  Yes Adrian Prows, MD  tamsulosin (FLOMAX) 0.4 MG CAPS capsule Take 0.4 mg by mouth at bedtime. 10/26/18  Yes [provider]  timolol (TIMOPTIC) 0.5 % ophthalmic solution Place 1 drop into both eyes 2 (two) times daily. 08/27/19  Yes [provider]     Vital Signs: BP (!) 148/94    Pulse 61    Temp 99.4 F (37.4 C) (Axillary)    Resp (!) 21    Ht '6\' 3"'  (1.905 m)    Wt 172 lb (78 kg)    SpO2 97%    BMI 21.50 kg/m   Physical Exam Vitals and nursing note reviewed.  Constitutional:      General: He is not in acute distress.    Appearance: Normal appearance.  Cardiovascular:     Rate and Rhythm: Normal rate and regular rhythm.     Heart sounds: Normal heart sounds. No murmur.  Pulmonary:     Effort: Pulmonary effort is normal. No respiratory  distress.     Breath sounds: Normal breath sounds. No wheezing.  Skin:    General: Skin is warm and dry.     Comments: Right groin incision soft with catheters intact and sandbag on top for pressure, mild oozing noted on dressings when sandbag removed, no palpable hematoma noted.  Neurological:     Mental Status: He is alert and oriented to person, place, and time.     Imaging: CT Angio Chest PE W and/or Wo Contrast  Result Date: 08/30/2019 CLINICAL DATA:  Syncopal episode while using the bathroom. EXAM: CT ANGIOGRAPHY CHEST WITH CONTRAST TECHNIQUE: Multidetector CT imaging of the chest was performed using the standard protocol during bolus administration of intravenous contrast. Multiplanar CT image reconstructions and MIPs were obtained to evaluate the vascular anatomy. CONTRAST:  184m OMNIPAQUE IOHEXOL 350 MG/ML SOLN COMPARISON:  None. FINDINGS: Cardiovascular: Massive bilateral pulmonary arterial filling defects with near complete occlusion of the right pulmonary artery, no pulmonary arterial blood flow noted to the right lung. Large thrombus in the left lower lobe pulmonary artery which is partially occlusive. Marked right heart dilatation with contrast refluxing into the hepatic veins and IVC. Elevated RV to LV ratio of 2.16. Aortic atherosclerosis. Cannot assess for dissection given phase of contrast. Mild dilatation of the aortic root at 3.6 cm without aortic aneurysm. No pericardial effusion. Mediastinum/Nodes: No adenopathy. No esophageal wall thickening.  No suspicious thyroid nodule. Lungs/Pleura: Triangular subpleural opacities in the right upper and lower lobes likely pulmonary infarct. Diminished pulmonary vessels in the right lung related to pulmonary embolus. Subpleural opacities in the left lower lobe favor atelectasis. No pulmonary mass. No pulmonary edema. Upper Abdomen: Contrast refluxes into the hepatic veins and IVC. No other acute findings. Musculoskeletal: There are no acute or  suspicious osseous abnormalities. Few bone islands in the right proximal humerus. Review of the MIP images confirms the above findings. IMPRESSION: 1. Massive pulmonary emboli with complete occlusion of the right main pulmonary artery and absent pulmonary arterial blood flow to the right lung. Large but partially occlusive filling defects in the left lower lobe pulmonary arteries. Significant right heart strain with RV to LV ratio of greater than 2, right heart dilatation and contrast refluxing into the hepatic veins and IVC. 2. Small pulmonary infarcts in the right upper and lower lobe. Critical Value/emergent results were called by telephone at the time of interpretation on 08/30/2019 at 6:24 am to Dr Addison Lank , who verbally acknowledged these results. Electronically Signed   By: Keith Rake M.D.   On: 08/30/2019 06:30   IR Angiogram Pulmonary Bilateral Selective  Result Date: 08/31/2019 INDICATION: Sub massive pulmonary embolism. Request made for initiation of bilateral catheter directed pulmonary arterial thrombolysis. EXAM: 1. ULTRASOUND GUIDANCE FOR VENOUS ACCESS X2 2. PULMONARY ARTERIOGRAPHY 3. FLUOROSCOPIC GUIDED PLACEMENT OF BILATERAL PULMONARY ARTERIAL LYTIC INFUSION CATHETERS COMPARISON:  Chest CTA-earlier same day MEDICATIONS: None ANESTHESIA/SEDATION: Moderate (conscious) sedation was employed during this procedure. A total of Versed 2.5 mg and Fentanyl 100 mcg was administered intravenously. Moderate Sedation Time: 33 minutes. The patient's level of consciousness and vital signs were monitored continuously by radiology nursing throughout the procedure under my direct supervision. CONTRAST:  58m OMNIPAQUE IOHEXOL 300 MG/ML  SOLN FLUOROSCOPY TIME:  9 minutes, 18 seconds (1161mGy) COMPLICATIONS: None immediate. TECHNIQUE: Informed written consent was obtained from the patient after a discussion of the risks, benefits and alternatives to treatment. Questions regarding the procedure were  encouraged and answered. A timeout was performed prior to the initiation of the procedure. Ultrasound scanning was performed of the right groin and demonstrated near occlusive thrombus within the right common femoral vein. Sonographic evaluation the left groin demonstrates wide patency of the left common femoral vein. As such, the left common femoral vein was selected venous access. The left groin was prepped and draped in the usual sterile fashion, and a sterile drape was applied covering the operative field. Maximum barrier sterile technique with sterile gowns and gloves were used for the procedure. A timeout was performed prior to the initiation of the procedure. Local anesthesia was provided with 1% lidocaine. Under direct ultrasound guidance, the right common femoral vein was accessed with a micro puncture kit ultimately allowing placement of a 6 French, 35 cm vascular sheath. Slightly cranial to this initial access, the right common femoral was again accessed with an additional micropuncture kit ultimately allowing placement of an additional 7 French, 35 cm vascular sheath. Ultrasound images were saved for procedural documentation purposes. With the use of a stiff glidewire, a vertebral catheter was advanced into the main pulmonary artery and a limited central pulmonary arteriogram was performed. Pressure measurements were then obtained from the main pulmonary artery. The vertebral catheter was advanced into the distal branch of the left lower lobe pulmonary artery. Limited contrast injection confirmed appropriate positioning. Over an exchange length Rosen wire, the vertebral catheter was exchanged for a 90/10 cm  multi side-hole infusion catheter. Again, with the use of a stiff glidewire, a vertebral catheter was advanced into a distal branch of the right lower lobe pulmonary artery. Limited contrast injection confirmed appropriate positioning. Over an exchange length Rosen wire, the pigtail catheter was  exchanged for a 90/15 cm multi side-hole infusion catheter. A postprocedural fluoroscopic image was obtained to document final catheter positioning. Both vascular sheath were secured at the left groin. The external catheter tubing was secured at the right thigh and the lytic therapy was initiated. The patient tolerated the procedure well without immediate postprocedural complication. FINDINGS: Central pulmonary arteriogram demonstrates significant near occlusive thrombus involving the peripheral aspects of both the right and left main pulmonary arteries with marked dilatation of the central pulmonary arterial system. Additionally, limited right main pulmonary arteriogram demonstrates near occlusive thrombus within the peripheral aspect of the right main pulmonary artery. Acquired pressure measurements: Main  pulmonary artery-61/16; mean-40 (normal: < 25/10) Following the procedure, both ultrasound assisted infusion catheter tips terminate within the distal aspects of the bilateral lower lobe sub segmental pulmonary arteries. IMPRESSION: 1. Successful fluoroscopic guided initiation of bilateral ultrasound assisted catheter directed pulmonary arterial lysis for sub massive pulmonary embolism and right-sided heart strain. 2. Elevated pressure measurements within the main pulmonary artery compatible with critical pulmonary arterial hypertension. 3. Near occlusive DVT noted within the right common femoral vein. PLAN: Above findings discussed with Dr. Hart Robinsons (critical care) at the time of procedure completion and the decision made to proceed with IVC filter placement for temporary caval interruption purposes at the time of post lysis pressure measurement acquisition. Electronically Signed   By: Sandi Mariscal M.D.   On: 08/31/2019 08:24   IR US Guide Vasc Access Left  Result Date: 08/31/2019 INDICATION: Sub massive pulmonary embolism. Request made for initiation of bilateral catheter directed pulmonary arterial  thrombolysis. EXAM: 1. ULTRASOUND GUIDANCE FOR VENOUS ACCESS X2 2. PULMONARY ARTERIOGRAPHY 3. FLUOROSCOPIC GUIDED PLACEMENT OF BILATERAL PULMONARY ARTERIAL LYTIC INFUSION CATHETERS COMPARISON:  Chest CTA-earlier same day MEDICATIONS: None ANESTHESIA/SEDATION: Moderate (conscious) sedation was employed during this procedure. A total of Versed 2.5 mg and Fentanyl 100 mcg was administered intravenously. Moderate Sedation Time: 33 minutes. The patient's level of consciousness and vital signs were monitored continuously by radiology nursing throughout the procedure under my direct supervision. CONTRAST:  63m OMNIPAQUE IOHEXOL 300 MG/ML  SOLN FLUOROSCOPY TIME:  9 minutes, 18 seconds (1488mGy) COMPLICATIONS: None immediate. TECHNIQUE: Informed written consent was obtained from the patient after a discussion of the risks, benefits and alternatives to treatment. Questions regarding the procedure were encouraged and answered. A timeout was performed prior to the initiation of the procedure. Ultrasound scanning was performed of the right groin and demonstrated near occlusive thrombus within the right common femoral vein. Sonographic evaluation the left groin demonstrates wide patency of the left common femoral vein. As such, the left common femoral vein was selected venous access. The left groin was prepped and draped in the usual sterile fashion, and a sterile drape was applied covering the operative field. Maximum barrier sterile technique with sterile gowns and gloves were used for the procedure. A timeout was performed prior to the initiation of the procedure. Local anesthesia was provided with 1% lidocaine. Under direct ultrasound guidance, the right common femoral vein was accessed with a micro puncture kit ultimately allowing placement of a 6 French, 35 cm vascular sheath. Slightly cranial to this initial access, the right common femoral was again accessed with an additional micropuncture kit ultimately allowing  placement of an additional 7 [place], 35 cm vascular sheath. Ultrasound images were saved for procedural documentation purposes. With the use of a stiff glidewire, a vertebral catheter was advanced into the main pulmonary artery and a limited central pulmonary arteriogram was performed. Pressure measurements were then obtained from the main pulmonary artery. The vertebral catheter was advanced into the distal branch of the left lower lobe pulmonary artery. Limited contrast injection confirmed appropriate positioning. Over an exchange length Rosen wire, the vertebral catheter was exchanged for a 90/10 cm multi side-hole infusion catheter. Again, with the use of a stiff glidewire, a vertebral catheter was advanced into a distal branch of the right lower lobe pulmonary artery. Limited contrast injection confirmed appropriate positioning. Over an exchange length Rosen wire, the pigtail catheter was exchanged for a 90/15 cm multi side-hole infusion catheter. A postprocedural fluoroscopic image was obtained to document final catheter positioning. Both vascular sheath were secured at the left groin. The external catheter tubing was secured at the right thigh and the lytic therapy was initiated. The patient tolerated the procedure well without immediate postprocedural complication. FINDINGS: Central pulmonary arteriogram demonstrates significant near occlusive thrombus involving the peripheral aspects of both the right and left main pulmonary arteries with marked dilatation of the central pulmonary arterial system. Additionally, limited right main pulmonary arteriogram demonstrates near occlusive thrombus within the peripheral aspect of the right main pulmonary artery. Acquired pressure measurements: Main  pulmonary artery-61/16; mean-40 (normal: < 25/10) Following the procedure, both ultrasound assisted infusion catheter tips terminate within the distal aspects of the bilateral lower lobe sub segmental pulmonary arteries.  IMPRESSION: 1. Successful fluoroscopic guided initiation of bilateral ultrasound assisted catheter directed pulmonary arterial lysis for sub massive pulmonary embolism and right-sided heart strain. 2. Elevated pressure measurements within the main pulmonary artery compatible with critical pulmonary arterial hypertension. 3. Near occlusive DVT noted within the right common femoral vein. PLAN: Above findings discussed with Dr. Hart Robinsons (critical care) at the time of procedure completion and the decision made to proceed with IVC filter placement for temporary caval interruption purposes at the time of post lysis pressure measurement acquisition. Electronically Signed   By: Sandi Mariscal M.D.   On: 08/31/2019 08:24   IR US Guide Vasc Access Left  Result Date: 08/31/2019 INDICATION: Sub massive pulmonary embolism. Request made for initiation of bilateral catheter directed pulmonary arterial thrombolysis. EXAM: 1. ULTRASOUND GUIDANCE FOR VENOUS ACCESS X2 2. PULMONARY ARTERIOGRAPHY 3. FLUOROSCOPIC GUIDED PLACEMENT OF BILATERAL PULMONARY ARTERIAL LYTIC INFUSION CATHETERS COMPARISON:  Chest CTA-earlier same day MEDICATIONS: None ANESTHESIA/SEDATION: Moderate (conscious) sedation was employed during this procedure. A total of Versed 2.5 mg and Fentanyl 100 mcg was administered intravenously. Moderate Sedation Time: 33 minutes. The patient's level of consciousness and vital signs were monitored continuously by radiology nursing throughout the procedure under my direct supervision. CONTRAST:  63m OMNIPAQUE IOHEXOL 300 MG/ML  SOLN FLUOROSCOPY TIME:  9 minutes, 18 seconds (1275mGy) COMPLICATIONS: None immediate. TECHNIQUE: Informed written consent was obtained from the patient after a discussion of the risks, benefits and alternatives to treatment. Questions regarding the procedure were encouraged and answered. A timeout was performed prior to the initiation of the procedure. Ultrasound scanning was performed of the right groin  and demonstrated near occlusive thrombus within the right common femoral vein. Sonographic evaluation the left groin demonstrates wide patency of the left common femoral vein. As such, the left common femoral vein was selected venous access. The left groin was prepped and draped in the usual sterile  fashion, and a sterile drape was applied covering the operative field. Maximum barrier sterile technique with sterile gowns and gloves were used for the procedure. A timeout was performed prior to the initiation of the procedure. Local anesthesia was provided with 1% lidocaine. Under direct ultrasound guidance, the right common femoral vein was accessed with a micro puncture kit ultimately allowing placement of a 6 French, 35 cm vascular sheath. Slightly cranial to this initial access, the right common femoral was again accessed with an additional micropuncture kit ultimately allowing placement of an additional 7 French, 35 cm vascular sheath. Ultrasound images were saved for procedural documentation purposes. With the use of a stiff glidewire, a vertebral catheter was advanced into the main pulmonary artery and a limited central pulmonary arteriogram was performed. Pressure measurements were then obtained from the main pulmonary artery. The vertebral catheter was advanced into the distal branch of the left lower lobe pulmonary artery. Limited contrast injection confirmed appropriate positioning. Over an exchange length Rosen wire, the vertebral catheter was exchanged for a 90/10 cm multi side-hole infusion catheter. Again, with the use of a stiff glidewire, a vertebral catheter was advanced into a distal branch of the right lower lobe pulmonary artery. Limited contrast injection confirmed appropriate positioning. Over an exchange length Rosen wire, the pigtail catheter was exchanged for a 90/15 cm multi side-hole infusion catheter. A postprocedural fluoroscopic image was obtained to document final catheter positioning.  Both vascular sheath were secured at the left groin. The external catheter tubing was secured at the right thigh and the lytic therapy was initiated. The patient tolerated the procedure well without immediate postprocedural complication. FINDINGS: Central pulmonary arteriogram demonstrates significant near occlusive thrombus involving the peripheral aspects of both the right and left main pulmonary arteries with marked dilatation of the central pulmonary arterial system. Additionally, limited right main pulmonary arteriogram demonstrates near occlusive thrombus within the peripheral aspect of the right main pulmonary artery. Acquired pressure measurements: Main  pulmonary artery-61/16; mean-40 (normal: < 25/10) Following the procedure, both ultrasound assisted infusion catheter tips terminate within the distal aspects of the bilateral lower lobe sub segmental pulmonary arteries. IMPRESSION: 1. Successful fluoroscopic guided initiation of bilateral ultrasound assisted catheter directed pulmonary arterial lysis for sub massive pulmonary embolism and right-sided heart strain. 2. Elevated pressure measurements within the main pulmonary artery compatible with critical pulmonary arterial hypertension. 3. Near occlusive DVT noted within the right common femoral vein. PLAN: Above findings discussed with Dr. Hart Robinsons (critical care) at the time of procedure completion and the decision made to proceed with IVC filter placement for temporary caval interruption purposes at the time of post lysis pressure measurement acquisition. Electronically Signed   By: Sandi Mariscal M.D.   On: 08/31/2019 08:24   ECHOCARDIOGRAM COMPLETE  Result Date: 08/30/2019    ECHOCARDIOGRAM REPORT   Patient Name:   Joe Cantu Date of Exam: 08/30/2019 Medical Rec #:  [id number]     Height:       75.0 in Accession #:    [id number]    Weight:       172.0 lb Date of Birth:  09/15/1950     BSA:          2.06 m Patient Age:    69 years      BP:            122/87 mmHg Patient Gender: M             HR:  98 bpm. Exam Location:  Inpatient Procedure: 2D Echo, Cardiac Doppler and Color Doppler STAT ECHO Indications:    R55 Syncope  History:        Patient has no prior history of Echocardiogram examinations.                 Aortic Rood Dilatation.  Sonographer:    Jonelle Sidle Dance Referring Phys: [id number] [place]  1. Severe RA/RV dilation with reduced RV function and severe pulmonary hypertension. LV hyperdynamic and borderline small cavity/underfilled. No hemodynamically significant valve disease.  2. Left ventricular ejection fraction, by estimation, is 65 to 70%. The left ventricle has hyperdynamic function. The left ventricle has no regional wall motion abnormalities. There is moderate concentric left ventricular hypertrophy. Left ventricular diastolic function could not be evaluated. There is the interventricular septum is flattened in systole and diastole, consistent with right ventricular pressure and volume overload.  3. Right ventricular systolic function is moderately reduced. The right ventricular size is severely enlarged. There is severely elevated pulmonary artery systolic pressure. The estimated right ventricular systolic pressure is 93.7 mmHg.  4. Right atrial size was severely dilated.  5. The mitral valve is normal in structure and function. Trivial mitral valve regurgitation. No evidence of mitral stenosis.  6. Tricuspid valve regurgitation is moderate.  7. The aortic valve is tricuspid. Aortic valve regurgitation is trivial. No aortic stenosis is present.  8. Aortic dilatation noted. There is mild dilatation of the ascending aorta measuring 37 mm.  9. Mildly dilated pulmonary artery. 10. The inferior vena cava is dilated in size with <50% respiratory variability, suggesting right atrial pressure of 15 mmHg. 11. Cannot exclude IAS shunt. Comparison(s): No prior Echocardiogram. FINDINGS  Left Ventricle: Left ventricular  ejection fraction, by estimation, is 65 to 70%. The left ventricle has hyperdynamic function. The left ventricle has no regional wall motion abnormalities. The left ventricular internal cavity size was small. There is moderate concentric left ventricular hypertrophy. The interventricular septum is flattened in systole and diastole, consistent with right ventricular pressure and volume overload. Left ventricular diastolic function could not be evaluated. Right Ventricle: The right ventricular size is severely enlarged. Right vetricular wall thickness was not assessed. Right ventricular systolic function is moderately reduced. There is severely elevated pulmonary artery systolic pressure. The tricuspid regurgitant velocity is 3.91 m/s, and with an assumed right atrial pressure of 15 mmHg, the estimated right ventricular systolic pressure is 34.2 mmHg. Left Atrium: Left atrial size was normal in size. Right Atrium: Right atrial size was severely dilated. Pericardium: There is no evidence of pericardial effusion. Mitral Valve: The mitral valve is normal in structure and function. Trivial mitral valve regurgitation. No evidence of mitral valve stenosis. Tricuspid Valve: The tricuspid valve is normal in structure. Tricuspid valve regurgitation is moderate . No evidence of tricuspid stenosis. Aortic Valve: The aortic valve is tricuspid. Aortic valve regurgitation is trivial. No aortic stenosis is present. Pulmonic Valve: The pulmonic valve was grossly normal. Pulmonic valve regurgitation is mild. No evidence of pulmonic stenosis. Aorta: Aortic dilatation noted. There is mild dilatation of the ascending aorta measuring 37 mm. Pulmonary Artery: The pulmonary artery is mildly dilated. Venous: The inferior vena cava is dilated in size with less than 50% respiratory variability, suggesting right atrial pressure of 15 mmHg. IAS/Shunts: Cannot exclude IAS shunt. Additional Comments: There is a small pleural effusion in both left  and right lateral regions.  LEFT VENTRICLE PLAX 2D LVIDd:  3.33 cm LVIDs:         2.20 cm LV PW:         1.75 cm LV IVS:        2.02 cm LVOT diam:     2.40 cm LV SV:         72.61 ml LV SV Index:   14.28 LVOT Area:     4.52 cm  RIGHT VENTRICLE          IVC RV Basal diam:  4.20 cm  IVC diam: 2.20 cm RV Mid diam:    3.00 cm TAPSE (M-mode): 1.4 cm LEFT ATRIUM             Index       RIGHT ATRIUM           Index LA diam:        3.40 cm 1.65 cm/m  RA Area:     39.50 cm LA Vol (A2C):   42.1 ml 20.46 ml/m RA Volume:   182.50 ml 88.67 ml/m LA Vol (A4C):   43.0 ml 20.89 ml/m LA Biplane Vol: 42.4 ml 20.60 ml/m  AORTIC VALVE LVOT Vmax:   102.40 cm/s LVOT Vmean:  57.500 cm/s LVOT VTI:    0.160 m  AORTA Ao Root diam: 4.20 cm Ao Asc diam:  3.70 cm MV A velocity: 78.45 cm/s  TRICUSPID VALVE                            TR Peak grad:   61.2 mmHg                            TR Vmax:        391.00 cm/s                             SHUNTS                            Systemic VTI:  0.16 m                            Systemic Diam: 2.40 cm Buford Dresser MD Electronically signed by Buford Dresser MD Signature Date/Time: 08/30/2019/12:48:37 PM    Final    VAS [place] LOWER EXTREMITY VENOUS (DVT)  Result Date: 08/30/2019  Lower Venous DVTStudy Indications: Pulmonary embolism, and SOB.  Comparison Study: No prior exam. Performing Technologist: Baldwin Crown ARDMS, RVT  Examination Guidelines: A complete evaluation includes B-mode imaging, spectral Doppler, color Doppler, and power Doppler as needed of all accessible portions of each vessel. Bilateral testing is considered an integral part of a complete examination. Limited examinations for reoccurring indications may be performed as noted. The reflux portion of the exam is performed with the patient in reverse Trendelenburg.  +---------+---------------+---------+-----------+----------+---------------+  RIGHT      Compressibility Phasicity Spontaneity Properties Thrombus Aging   +---------+---------------+---------+-----------+----------+---------------+  CFV       Partial         Yes       Yes                                     +---------+---------------+---------+-----------+----------+---------------+  SFJ  None                                                              +---------+---------------+---------+-----------+----------+---------------+  FV Prox   Full                                                              +---------+---------------+---------+-----------+----------+---------------+  FV Mid    Full                                                              +---------+---------------+---------+-----------+----------+---------------+  FV Distal Full                                                              +---------+---------------+---------+-----------+----------+---------------+  PFV       Full                                                              +---------+---------------+---------+-----------+----------+---------------+  POP       Full            Yes       Yes                                     +---------+---------------+---------+-----------+----------+---------------+  PTV       Full                                             seen with color  +---------+---------------+---------+-----------+----------+---------------+  PERO      Full                                             seen with color  +---------+---------------+---------+-----------+----------+---------------+  EIV       Full            Yes       Yes                                     +---------+---------------+---------+-----------+----------+---------------+   +---------+---------------+---------+-----------+----------+---------------+  LEFT      Compressibility Phasicity Spontaneity Properties Thrombus Aging   +---------+---------------+---------+-----------+----------+---------------+  CFV       None            No         No                                      +---------+---------------+---------+-----------+----------+---------------+  SFJ       Full                                                              +---------+---------------+---------+-----------+----------+---------------+  FV Prox   Full                                                              +---------+---------------+---------+-----------+----------+---------------+  FV Mid    Full                                                              +---------+---------------+---------+-----------+----------+---------------+  FV Distal None                                                              +---------+---------------+---------+-----------+----------+---------------+  PFV       Full                                                              +---------+---------------+---------+-----------+----------+---------------+  POP       None            No        No                                      +---------+---------------+---------+-----------+----------+---------------+  PTV       Full                                             seen with color  +---------+---------------+---------+-----------+----------+---------------+  PERO      Full                                             seen with color  +---------+---------------+---------+-----------+----------+---------------+  EIV  Full                                                              +---------+---------------+---------+-----------+----------+---------------+ Poorly visusalized bilteral calf veins, visualized with color flow.    Summary: RIGHT: - Findings consistent with acute deep vein thrombosis involving the SF junction, and right common femoral vein. - Findings consistent with acute superficial vein thrombosis involving the right great saphenous vein.  LEFT: - Findings consistent with acute deep vein thrombosis involving the left common femoral vein, left femoral vein, and left popliteal  vein.  *See table(s) above for measurements and observations. Electronically signed by Harold Barban MD on 08/30/2019 at 6:38:23 PM.    Final    IR INFUSION THROMBOL ARTERIAL INITIAL (MS)  Result Date: 08/31/2019 INDICATION: Sub massive pulmonary embolism. Request made for initiation of bilateral catheter directed pulmonary arterial thrombolysis. EXAM: 1. ULTRASOUND GUIDANCE FOR VENOUS ACCESS X2 2. PULMONARY ARTERIOGRAPHY 3. FLUOROSCOPIC GUIDED PLACEMENT OF BILATERAL PULMONARY ARTERIAL LYTIC INFUSION CATHETERS COMPARISON:  Chest CTA-earlier same day MEDICATIONS: None ANESTHESIA/SEDATION: Moderate (conscious) sedation was employed during this procedure. A total of Versed 2.5 mg and Fentanyl 100 mcg was administered intravenously. Moderate Sedation Time: 33 minutes. The patient's level of consciousness and vital signs were monitored continuously by radiology nursing throughout the procedure under my direct supervision. CONTRAST:  74m OMNIPAQUE IOHEXOL 300 MG/ML  SOLN FLUOROSCOPY TIME:  9 minutes, 18 seconds (1158mGy) COMPLICATIONS: None immediate. TECHNIQUE: Informed written consent was obtained from the patient after a discussion of the risks, benefits and alternatives to treatment. Questions regarding the procedure were encouraged and answered. A timeout was performed prior to the initiation of the procedure. Ultrasound scanning was performed of the right groin and demonstrated near occlusive thrombus within the right common femoral vein. Sonographic evaluation the left groin demonstrates wide patency of the left common femoral vein. As such, the left common femoral vein was selected venous access. The left groin was prepped and draped in the usual sterile fashion, and a sterile drape was applied covering the operative field. Maximum barrier sterile technique with sterile gowns and gloves were used for the procedure. A timeout was performed prior to the initiation of the procedure. Local anesthesia was provided  with 1% lidocaine. Under direct ultrasound guidance, the right common femoral vein was accessed with a micro puncture kit ultimately allowing placement of a 6 French, 35 cm vascular sheath. Slightly cranial to this initial access, the right common femoral was again accessed with an additional micropuncture kit ultimately allowing placement of an additional 7 French, 35 cm vascular sheath. Ultrasound images were saved for procedural documentation purposes. With the use of a stiff glidewire, a vertebral catheter was advanced into the main pulmonary artery and a limited central pulmonary arteriogram was performed. Pressure measurements were then obtained from the main pulmonary artery. The vertebral catheter was advanced into the distal branch of the left lower lobe pulmonary artery. Limited contrast injection confirmed appropriate positioning. Over an exchange length Rosen wire, the vertebral catheter was exchanged for a 90/10 cm multi side-hole infusion catheter. Again, with the use of a stiff glidewire, a vertebral catheter was advanced into a distal branch of the right lower lobe pulmonary artery. Limited contrast injection confirmed appropriate positioning. Over an exchange length Rosen wire, the pigtail  catheter was exchanged for a 90/15 cm multi side-hole infusion catheter. A postprocedural fluoroscopic image was obtained to document final catheter positioning. Both vascular sheath were secured at the left groin. The external catheter tubing was secured at the right thigh and the lytic therapy was initiated. The patient tolerated the procedure well without immediate postprocedural complication. FINDINGS: Central pulmonary arteriogram demonstrates significant near occlusive thrombus involving the peripheral aspects of both the right and left main pulmonary arteries with marked dilatation of the central pulmonary arterial system. Additionally, limited right main pulmonary arteriogram demonstrates near occlusive  thrombus within the peripheral aspect of the right main pulmonary artery. Acquired pressure measurements: Main  pulmonary artery-61/16; mean-40 (normal: < 25/10) Following the procedure, both ultrasound assisted infusion catheter tips terminate within the distal aspects of the bilateral lower lobe sub segmental pulmonary arteries. IMPRESSION: 1. Successful fluoroscopic guided initiation of bilateral ultrasound assisted catheter directed pulmonary arterial lysis for sub massive pulmonary embolism and right-sided heart strain. 2. Elevated pressure measurements within the main pulmonary artery compatible with critical pulmonary arterial hypertension. 3. Near occlusive DVT noted within the right common femoral vein. PLAN: Above findings discussed with Dr. Hart Robinsons (critical care) at the time of procedure completion and the decision made to proceed with IVC filter placement for temporary caval interruption purposes at the time of post lysis pressure measurement acquisition. Electronically Signed   By: Sandi Mariscal M.D.   On: 08/31/2019 08:24   IR INFUSION THROMBOL ARTERIAL INITIAL (MS)  Result Date: 08/31/2019 INDICATION: Sub massive pulmonary embolism. Request made for initiation of bilateral catheter directed pulmonary arterial thrombolysis. EXAM: 1. ULTRASOUND GUIDANCE FOR VENOUS ACCESS X2 2. PULMONARY ARTERIOGRAPHY 3. FLUOROSCOPIC GUIDED PLACEMENT OF BILATERAL PULMONARY ARTERIAL LYTIC INFUSION CATHETERS COMPARISON:  Chest CTA-earlier same day MEDICATIONS: None ANESTHESIA/SEDATION: Moderate (conscious) sedation was employed during this procedure. A total of Versed 2.5 mg and Fentanyl 100 mcg was administered intravenously. Moderate Sedation Time: 33 minutes. The patient's level of consciousness and vital signs were monitored continuously by radiology nursing throughout the procedure under my direct supervision. CONTRAST:  29m OMNIPAQUE IOHEXOL 300 MG/ML  SOLN FLUOROSCOPY TIME:  9 minutes, 18 seconds (1481mGy)  COMPLICATIONS: None immediate. TECHNIQUE: Informed written consent was obtained from the patient after a discussion of the risks, benefits and alternatives to treatment. Questions regarding the procedure were encouraged and answered. A timeout was performed prior to the initiation of the procedure. Ultrasound scanning was performed of the right groin and demonstrated near occlusive thrombus within the right common femoral vein. Sonographic evaluation the left groin demonstrates wide patency of the left common femoral vein. As such, the left common femoral vein was selected venous access. The left groin was prepped and draped in the usual sterile fashion, and a sterile drape was applied covering the operative field. Maximum barrier sterile technique with sterile gowns and gloves were used for the procedure. A timeout was performed prior to the initiation of the procedure. Local anesthesia was provided with 1% lidocaine. Under direct ultrasound guidance, the right common femoral vein was accessed with a micro puncture kit ultimately allowing placement of a 6 French, 35 cm vascular sheath. Slightly cranial to this initial access, the right common femoral was again accessed with an additional micropuncture kit ultimately allowing placement of an additional 7 French, 35 cm vascular sheath. Ultrasound images were saved for procedural documentation purposes. With the use of a stiff glidewire, a vertebral catheter was advanced into the main pulmonary artery and a limited central pulmonary arteriogram was  performed. Pressure measurements were then obtained from the main pulmonary artery. The vertebral catheter was advanced into the distal branch of the left lower lobe pulmonary artery. Limited contrast injection confirmed appropriate positioning. Over an exchange length Rosen wire, the vertebral catheter was exchanged for a 90/10 cm multi side-hole infusion catheter. Again, with the use of a stiff glidewire, a vertebral  catheter was advanced into a distal branch of the right lower lobe pulmonary artery. Limited contrast injection confirmed appropriate positioning. Over an exchange length Rosen wire, the pigtail catheter was exchanged for a 90/15 cm multi side-hole infusion catheter. A postprocedural fluoroscopic image was obtained to document final catheter positioning. Both vascular sheath were secured at the left groin. The external catheter tubing was secured at the right thigh and the lytic therapy was initiated. The patient tolerated the procedure well without immediate postprocedural complication. FINDINGS: Central pulmonary arteriogram demonstrates significant near occlusive thrombus involving the peripheral aspects of both the right and left main pulmonary arteries with marked dilatation of the central pulmonary arterial system. Additionally, limited right main pulmonary arteriogram demonstrates near occlusive thrombus within the peripheral aspect of the right main pulmonary artery. Acquired pressure measurements: Main  pulmonary artery-61/16; mean-40 (normal: < 25/10) Following the procedure, both ultrasound assisted infusion catheter tips terminate within the distal aspects of the bilateral lower lobe sub segmental pulmonary arteries. IMPRESSION: 1. Successful fluoroscopic guided initiation of bilateral ultrasound assisted catheter directed pulmonary arterial lysis for sub massive pulmonary embolism and right-sided heart strain. 2. Elevated pressure measurements within the main pulmonary artery compatible with critical pulmonary arterial hypertension. 3. Near occlusive DVT noted within the right common femoral vein. PLAN: Above findings discussed with Dr. Hart Robinsons (critical care) at the time of procedure completion and the decision made to proceed with IVC filter placement for temporary caval interruption purposes at the time of post lysis pressure measurement acquisition. Electronically Signed   By: Sandi Mariscal M.D.   On:  08/31/2019 08:24    Labs:  CBC: Recent Labs    08/30/19 0445 08/30/19 0453 08/31/19 0019 08/31/19 0430  WBC 10.0  --  12.2* 10.5  HGB 12.8* 12.9* 12.2* 11.0*  HCT 38.3* 38.0* 36.6* 32.8*  PLT 296  --  254 263    COAGS: Recent Labs    08/30/19 0656  INR 1.2  APTT 28    BMP: Recent Labs    08/30/19 0445 08/30/19 0453 08/31/19 0430  NA 139 140 140  K 4.4 4.3 4.2  CL 108 107 113*  CO2 22  --  18*  GLUCOSE 130* 123* 119*  BUN 21 18 24*  CALCIUM 10.5*  --  9.2  CREATININE 0.83 0.80 0.84  GFRNONAA >60  --  >60  GFRAA >60  --  >60    LIVER FUNCTION TESTS: Recent Labs    08/30/19 0445 08/31/19 0430  BILITOT 0.9 1.3*  AST 87* 57*  ALT 79* 73*  ALKPHOS 89 76  PROT 7.6 5.9*  ALBUMIN 3.5 2.7*    Assessment and Plan:  History of submassive PE s/p bilateral catheter directed pulmonary artery thrombolysis in IR 08/30/2019 by Dr. Pascal Lux. Left groin soft with catheters intact, mild oozing from site noted (as expected given IV Heparin use)- no change to management plan at this time, continue to hold pressure (sandbag) as needed and change dressings accordingly, plan for post lysis pressure measurement with possible catheter removal today.  Patient also with known DVT. At this time, it is recommended that patient undergo IVCF  placement for temporary caval interruption purposes. Plan for image-guided IVCF placement today in IR during post lysis pressure measurement. Patient is NPO. Afebrile. Ok to continue with IV Heparin per Dr. Kathlene Cote.  Risks and benefits discussed with the patient including, but not limited to bleeding, infection, contrast induced renal failure, filter fracture or migration which can lead to emergency surgery or even death, strut penetration with damage or irritation to adjacent structures and caval thrombosis. All of the patient's questions were answered, patient is agreeable to proceed. Consent signed and in chart.  IR to  follow.   Electronically Signed: Earley Abide, PA-C 08/31/2019, 9:30 AM   I spent a total of 35 Minutes at the the patient's bedside AND on the patient's hospital floor or unit, greater than 50% of which was counseling/coordinating care for PE s/p lysis and DVT/IVCF placement.

## 2019-08-31 NOTE — Procedures (Signed)
Interventional Radiology Procedure Note  Procedure: Pulmonary artery pressure measurements post thrombolysis; IVC filter placement  Complications: None  Estimated Blood Loss: < 10 mL  Findings: PAP 55/20, mean 28 mm Hg after 12 hours of bilateral tPA lysis (61/16, 40 mm Hg prior to lysis).  IVC normally patent by venography. [place] retrievable IVC filter placed in infrarenal IVC via left femoral venous access.  Jodi Marble. Fredia Sorrow, M.D Pager:  [phone]

## 2019-08-31 NOTE — Progress Notes (Signed)
Pharmacy: Re- heparin  Patient's a 69 y.o M with bilateral PE and DVTs (s/p EKOS on 2/17 with tPA off at 0550 this morning) currently on heparin drip.  IVC filter placed on 2/18.  - heparin level is therapeutic at 0.48 (goal 0.3-0.7) - Per pt's RN, no bleeding noted since sheath removal  Plan: -continue heparin drip at 1500units/hr - daily heparin level - monitor for s/s bleeding  Dorna Leitz, PharmD, BCPS 08/31/2019 4:39 PM

## 2019-08-31 NOTE — Progress Notes (Addendum)
TPA complete.  NS at 20.8 ml/hr in each sheath along with NS at 20 ml/hr.  No further bleeding at site on gauze.  Pharmacy aware.  Dr Warrick Parisian aware.

## 2019-08-31 NOTE — Progress Notes (Signed)
Sanguinous drainage on dressing of left femoral sheaths, pressure applied for 30 minutes, new dressing over thrombin pad, 5 lb sand bag in place.  Dr. Warrick Parisian aware.

## 2019-08-31 NOTE — Progress Notes (Signed)
Pt left femoral dressing saturated with blood.  Pressure applied to site.  Dr. Warrick Parisian notified and orders received.  Thrombin pad placed with 4 x4 gauze/tegaderm, ABD and 5 lb sand bag on top.  Good pulse left foot, warm  To touch.  Pt alert, oriented.

## 2019-08-31 NOTE — Progress Notes (Signed)
ANTICOAGULATION CONSULT NOTE Pharmacy Consult for IV Heparin Indication: pulmonary embolus  No Known Allergies  Patient Measurements: Height: 6\' 3"  (190.5 cm) Weight: 172 lb (78 kg) IBW/kg (Calculated) : 84.5 Heparin Dosing Weight: actual  Vital Signs: Temp: 99.1 F (37.3 C) (02/18 0337) Temp Source: Axillary (02/18 0337) BP: 142/95 (02/18 0000) Pulse Rate: 71 (02/18 0000)  Labs: Recent Labs    08/30/19 0445 08/30/19 0445 08/30/19 0453 08/30/19 0453 08/30/19 0656 08/30/19 1338 08/30/19 1637 08/30/19 2248 08/31/19 0019 08/31/19 0430  HGB 12.8*   < > 12.9*   < >  --   --   --   --  12.2* 11.0*  HCT 38.3*   < > 38.0*  --   --   --   --   --  36.6* 32.8*  PLT 296  --   --   --   --   --   --   --  254 263  APTT  --   --   --   --  28  --   --   --   --   --   LABPROT  --   --   --   --  15.4*  --   --   --   --   --   INR  --   --   --   --  1.2  --   --   --   --   --   HEPARINUNFRC  --   --   --   --   --    < > 0.47 0.50  --  0.55  CREATININE 0.83  --  0.80  --   --   --   --   --   --  0.84  TROPONINIHS 59*  --   --   --  62*  --   --   --   --   --    < > = values in this interval not displayed.    Estimated Creatinine Clearance: 95.4 mL/min (by C-G formula based on SCr of 0.84 mg/dL).   Assessment: 81 yoM c/o loss of consciousness found to have massive PE with complete occlusion of the right main pulmonary artery and absent pulmonary arterial blood flow to right lung. Patient underwent catheter directed thrombolysis on 2/17 around 17:00. Pharmacy to dose IV heparin for PE  Baselines: Hg 12.9, PLTC 296, INR 1.2, aPTT 28   2/17  Confirmatory heparin level therapeutic at 0.47 units/hr  Catheter directed thrombolysis completed at 1700 without complication  Post procedural CBC pending  2248 HL = 0.50 at goal, Patient has some bleeding from femoral venipuncture sites- MD ordered thrombi pads to site Today, 2/18  0430 HL = 0.55 at goal. Site still oozing  per RN and  TPA infusion is finished and she is going to hang the NS now.   Goal of Therapy:  Heparin level 0.3-0.7 units/ml Monitor platelets by anticoagulation protocol: Yes   Plan:   Continue heparin drip at 1500 units/hr  CBC & Heparin levels q6 hr x 24 hr; will order daily CBC and PRN heparin levels afterward  Per updated protocol, heparin is no longer paused for sheath removal  F/u for transition to long-term anticoagulant  Monitor for s/s bleeding or worsening thrombosis  3/18 Lorenza Evangelist 08/31/2019, 5:24 AM

## 2019-09-01 DIAGNOSIS — R55 Syncope and collapse: Secondary | ICD-10-CM

## 2019-09-01 DIAGNOSIS — I2699 Other pulmonary embolism without acute cor pulmonale: Principal | ICD-10-CM

## 2019-09-01 DIAGNOSIS — I824Y2 Acute embolism and thrombosis of unspecified deep veins of left proximal lower extremity: Secondary | ICD-10-CM

## 2019-09-01 DIAGNOSIS — I82409 Acute embolism and thrombosis of unspecified deep veins of unspecified lower extremity: Secondary | ICD-10-CM

## 2019-09-01 LAB — CBC
HCT: 32.3 % — ABNORMAL LOW (ref 39.0–52.0)
Hemoglobin: 10.8 g/dL — ABNORMAL LOW (ref 13.0–17.0)
MCH: 31.5 pg (ref 26.0–34.0)
MCHC: 33.4 g/dL (ref 30.0–36.0)
MCV: 94.2 fL (ref 80.0–100.0)
Platelets: 208 10*3/uL (ref 150–400)
RBC: 3.43 MIL/uL — ABNORMAL LOW (ref 4.22–5.81)
RDW: 13.1 % (ref 11.5–15.5)
WBC: 8.4 10*3/uL (ref 4.0–10.5)
nRBC: 0 % (ref 0.0–0.2)

## 2019-09-01 LAB — HEPARIN LEVEL (UNFRACTIONATED): Heparin Unfractionated: 0.5 IU/mL (ref 0.30–0.70)

## 2019-09-01 MED ORDER — GUAIFENESIN 100 MG/5ML PO SOLN
5.0000 mL | ORAL | Status: DC | PRN
  Administered 2019-09-01 – 2019-09-02 (×3): 100 mg via ORAL
  Filled 2019-09-01 (×3): qty 10

## 2019-09-01 NOTE — Progress Notes (Signed)
Referring Physician(s): Mannam, P  Supervising Physician: Sandi Mariscal  Patient Status:  [facility] IP  Chief Complaint:  SOB due to PE - s/p bilateral pulmonary artery infusion catheter insertion (2/17) and removal (2/18), and IVC filter placement (2/18)  Subjective:  Patient reports he is doing well today. He states he is having some SOB occasionally, but says his SOB has improved since his admission to the hospital. Patient denies fever, chills, chest pain, N/V, abdominal pain, bleeding and edema. He reports he has eaten this morning, and has not had a BM in 2 days. He denies any pain or bleeding from the insertion sight in the left femoral vein.   Allergies: Patient has no known allergies.  Medications: Prior to Admission medications   Medication Sig Start Date End Date Taking? Authorizing Provider  aspirin 81 MG tablet Take 81 mg by mouth daily.    Yes [provider]  dorzolamide (TRUSOPT) 2 % ophthalmic solution Place 1 drop into both eyes 2 (two) times daily. 08/27/19  Yes [provider]  Latanoprostene Bunod (VYZULTA) 0.024 % SOLN Apply 1 drop to eye daily.   Yes [provider]  metoprolol tartrate (LOPRESSOR) 50 MG tablet Take 1 tablet (50 mg total) by mouth 2 (two) times daily. 06/12/19  Yes Adrian Prows, MD  tamsulosin (FLOMAX) 0.4 MG CAPS capsule Take 0.4 mg by mouth at bedtime. 10/26/18  Yes [provider]  timolol (TIMOPTIC) 0.5 % ophthalmic solution Place 1 drop into both eyes 2 (two) times daily. 08/27/19  Yes [provider]     Vital Signs: BP (!) 148/90   Pulse 60   Temp 97.6 F (36.4 C) (Oral)   Resp 12   Ht '6\' 3"'  (1.905 m)   Wt 172 lb (78 kg)   SpO2 95%   BMI 21.50 kg/m   Physical Exam Cardiovascular:     Rate and Rhythm: Normal rate and regular rhythm.     Heart sounds: Normal heart sounds.  Pulmonary:     Effort: Pulmonary effort is normal.     Breath sounds: Normal breath sounds.  Abdominal:   General: Abdomen is flat. Bowel sounds are normal.     Palpations: Abdomen is soft.     Tenderness: There is no abdominal tenderness.  Skin:    General: Skin is warm and dry.     Comments: Insertion site at the left femoral vein is clean, dry and intact.   Neurological:     Mental Status: He is alert and oriented to person, place, and time.     Imaging: CT Angio Chest PE W and/or Wo Contrast  Result Date: 08/30/2019 CLINICAL DATA:  Syncopal episode while using the bathroom. EXAM: CT ANGIOGRAPHY CHEST WITH CONTRAST TECHNIQUE: Multidetector CT imaging of the chest was performed using the standard protocol during bolus administration of intravenous contrast. Multiplanar CT image reconstructions and MIPs were obtained to evaluate the vascular anatomy. CONTRAST:  119m OMNIPAQUE IOHEXOL 350 MG/ML SOLN COMPARISON:  None. FINDINGS: Cardiovascular: Massive bilateral pulmonary arterial filling defects with near complete occlusion of the right pulmonary artery, no pulmonary arterial blood flow noted to the right lung. Large thrombus in the left lower lobe pulmonary artery which is partially occlusive. Marked right heart dilatation with contrast refluxing into the hepatic veins and IVC. Elevated RV to LV ratio of 2.16. Aortic atherosclerosis. Cannot assess for dissection given phase of contrast. Mild dilatation of the aortic root at 3.6 cm without aortic aneurysm. No pericardial effusion. Mediastinum/Nodes:  No adenopathy. No esophageal wall thickening. No suspicious thyroid nodule. Lungs/Pleura: Triangular subpleural opacities in the right upper and lower lobes likely pulmonary infarct. Diminished pulmonary vessels in the right lung related to pulmonary embolus. Subpleural opacities in the left lower lobe favor atelectasis. No pulmonary mass. No pulmonary edema. Upper Abdomen: Contrast refluxes into the hepatic veins and IVC. No other acute findings. Musculoskeletal: There are no acute or suspicious osseous  abnormalities. Few bone islands in the right proximal humerus. Review of the MIP images confirms the above findings. IMPRESSION: 1. Massive pulmonary emboli with complete occlusion of the right main pulmonary artery and absent pulmonary arterial blood flow to the right lung. Large but partially occlusive filling defects in the left lower lobe pulmonary arteries. Significant right heart strain with RV to LV ratio of greater than 2, right heart dilatation and contrast refluxing into the hepatic veins and IVC. 2. Small pulmonary infarcts in the right upper and lower lobe. Critical Value/emergent results were called by telephone at the time of interpretation on 08/30/2019 at 6:24 am to Dr Addison Lank , who verbally acknowledged these results. Electronically Signed   By: Keith Rake M.D.   On: 08/30/2019 06:30   IR Angiogram Pulmonary Bilateral Selective  Result Date: 08/31/2019 INDICATION: Sub massive pulmonary embolism. Request made for initiation of bilateral catheter directed pulmonary arterial thrombolysis. EXAM: 1. ULTRASOUND GUIDANCE FOR VENOUS ACCESS X2 2. PULMONARY ARTERIOGRAPHY 3. FLUOROSCOPIC GUIDED PLACEMENT OF BILATERAL PULMONARY ARTERIAL LYTIC INFUSION CATHETERS COMPARISON:  Chest CTA-earlier same day MEDICATIONS: None ANESTHESIA/SEDATION: Moderate (conscious) sedation was employed during this procedure. A total of Versed 2.5 mg and Fentanyl 100 mcg was administered intravenously. Moderate Sedation Time: 33 minutes. The patient's level of consciousness and vital signs were monitored continuously by radiology nursing throughout the procedure under my direct supervision. CONTRAST:  56m OMNIPAQUE IOHEXOL 300 MG/ML  SOLN FLUOROSCOPY TIME:  9 minutes, 18 seconds (1357mGy) COMPLICATIONS: None immediate. TECHNIQUE: Informed written consent was obtained from the patient after a discussion of the risks, benefits and alternatives to treatment. Questions regarding the procedure were encouraged and answered.  A timeout was performed prior to the initiation of the procedure. Ultrasound scanning was performed of the right groin and demonstrated near occlusive thrombus within the right common femoral vein. Sonographic evaluation the left groin demonstrates wide patency of the left common femoral vein. As such, the left common femoral vein was selected venous access. The left groin was prepped and draped in the usual sterile fashion, and a sterile drape was applied covering the operative field. Maximum barrier sterile technique with sterile gowns and gloves were used for the procedure. A timeout was performed prior to the initiation of the procedure. Local anesthesia was provided with 1% lidocaine. Under direct ultrasound guidance, the right common femoral vein was accessed with a micro puncture kit ultimately allowing placement of a 6 French, 35 cm vascular sheath. Slightly cranial to this initial access, the right common femoral was again accessed with an additional micropuncture kit ultimately allowing placement of an additional 7 French, 35 cm vascular sheath. Ultrasound images were saved for procedural documentation purposes. With the use of a stiff glidewire, a vertebral catheter was advanced into the main pulmonary artery and a limited central pulmonary arteriogram was performed. Pressure measurements were then obtained from the main pulmonary artery. The vertebral catheter was advanced into the distal branch of the left lower lobe pulmonary artery. Limited contrast injection confirmed appropriate positioning. Over an exchange length Rosen wire, the vertebral catheter  was exchanged for a 90/10 cm multi side-hole infusion catheter. Again, with the use of a stiff glidewire, a vertebral catheter was advanced into a distal branch of the right lower lobe pulmonary artery. Limited contrast injection confirmed appropriate positioning. Over an exchange length Rosen wire, the pigtail catheter was exchanged for a 90/15 cm multi  side-hole infusion catheter. A postprocedural fluoroscopic image was obtained to document final catheter positioning. Both vascular sheath were secured at the left groin. The external catheter tubing was secured at the right thigh and the lytic therapy was initiated. The patient tolerated the procedure well without immediate postprocedural complication. FINDINGS: Central pulmonary arteriogram demonstrates significant near occlusive thrombus involving the peripheral aspects of both the right and left main pulmonary arteries with marked dilatation of the central pulmonary arterial system. Additionally, limited right main pulmonary arteriogram demonstrates near occlusive thrombus within the peripheral aspect of the right main pulmonary artery. Acquired pressure measurements: Main  pulmonary artery-61/16; mean-40 (normal: < 25/10) Following the procedure, both ultrasound assisted infusion catheter tips terminate within the distal aspects of the bilateral lower lobe sub segmental pulmonary arteries. IMPRESSION: 1. Successful fluoroscopic guided initiation of bilateral ultrasound assisted catheter directed pulmonary arterial lysis for sub massive pulmonary embolism and right-sided heart strain. 2. Elevated pressure measurements within the main pulmonary artery compatible with critical pulmonary arterial hypertension. 3. Near occlusive DVT noted within the right common femoral vein. PLAN: Above findings discussed with Dr. Hart Robinsons (critical care) at the time of procedure completion and the decision made to proceed with IVC filter placement for temporary caval interruption purposes at the time of post lysis pressure measurement acquisition. Electronically Signed   By: Sandi Mariscal M.D.   On: 08/31/2019 08:24   IR IVC FILTER PLMT / S&I Burke Keels GUID/MOD SED  Result Date: 08/31/2019 CLINICAL DATA:  Bilateral submassive pulmonary embolism with cor pulmonale. Status post thrombo lytic infusion via each pulmonary artery for 12  hours. Request has also been made to place an IVC filter on completion thrombo lytic therapy given residual DVT in both lower extremities. EXAM: 1. FINAL DAY ARTERIAL THROMBOLYTIC THERAPY PRESSURE MEASUREMENT. 2. IVC VENOGRAM. 3. PERCUTANEOUS IVC FILTER PLACEMENT. ANESTHESIA/SEDATION: 50 mcg IV Fentanyl. The patient was just given fentanyl and not formally sedated. CONTRAST:  90m OMNIPAQUE IOHEXOL 300 MG/ML SOLN, 139mOMNIPAQUE IOHEXOL 300 MG/ML SOLN, 1041mMNIPAQUE IOHEXOL 300 MG/ML SOLN FLUOROSCOPY TIME:  1 minutes and 30 seconds.  146.6 mGy. PROCEDURE: The procedure, risks, benefits, and alternatives were explained to the patient. Questions regarding the procedure were encouraged and answered. The patient understands and consents to the procedure. A time-out was performed prior to initiating the procedure. Pulmonary artery pressures were directly measured through the pulmonary arterial infusion catheters. The catheters were then removed. Left femoral vein sheaths placed yesterday were prepped and draped. Local anesthesia was provided with 1% Lidocaine. A guidewire was advanced into the inferior vena cava via one of the sheaths. This sheath was removed and the venotomy dilated. A 10 French deployment sheath was advanced over the guidewire. This was utilized to perform IVC venography. The deployment sheath was further positioned in an appropriate location for filter deployment. A Bard Denali IVC filter was then advanced in the sheath. This was then fully deployed in the infrarenal IVC. Final filter position was confirmed with a fluoroscopic spot image. After the procedure the sheath was removed and hemostasis obtained with manual compression. COMPLICATIONS: None. FINDINGS: Measure pulmonary artery pressure is 55/20, mean 28 mm Hg compared to 61/16, 40  mm Hg prior to lytic therapy. IVC venography demonstrates a normal caliber IVC with no evidence of thrombus. Renal veins are identified bilaterally. The IVC filter  was successfully positioned below the level of the renal veins and is appropriately oriented. This IVC filter has both permanent and retrievable indications. IMPRESSION: 1. Decrease in measured pulmonary artery pressure after 12 hours of bilateral pulmonary arterial thrombolytic therapy. 2. Placement of percutaneous IVC filter in infrarenal IVC. IVC venogram shows no evidence of IVC thrombus and normal caliber of the inferior vena cava. This filter does have both permanent and retrievable indications. PLAN: This IVC filter is potentially retrievable. The patient will be assessed for filter retrieval by Interventional Radiology in approximately 8-12 weeks. Further recommendations regarding filter retrieval, continued surveillance or declaration of device permanence, will be made at that time. Electronically Signed   By: Aletta Edouard M.D.   On: 08/31/2019 14:25   IR US Guide Vasc Access Left  Result Date: 08/31/2019 INDICATION: Sub massive pulmonary embolism. Request made for initiation of bilateral catheter directed pulmonary arterial thrombolysis. EXAM: 1. ULTRASOUND GUIDANCE FOR VENOUS ACCESS X2 2. PULMONARY ARTERIOGRAPHY 3. FLUOROSCOPIC GUIDED PLACEMENT OF BILATERAL PULMONARY ARTERIAL LYTIC INFUSION CATHETERS COMPARISON:  Chest CTA-earlier same day MEDICATIONS: None ANESTHESIA/SEDATION: Moderate (conscious) sedation was employed during this procedure. A total of Versed 2.5 mg and Fentanyl 100 mcg was administered intravenously. Moderate Sedation Time: 33 minutes. The patient's level of consciousness and vital signs were monitored continuously by radiology nursing throughout the procedure under my direct supervision. CONTRAST:  36m OMNIPAQUE IOHEXOL 300 MG/ML  SOLN FLUOROSCOPY TIME:  9 minutes, 18 seconds (1161mGy) COMPLICATIONS: None immediate. TECHNIQUE: Informed written consent was obtained from the patient after a discussion of the risks, benefits and alternatives to treatment. Questions regarding the  procedure were encouraged and answered. A timeout was performed prior to the initiation of the procedure. Ultrasound scanning was performed of the right groin and demonstrated near occlusive thrombus within the right common femoral vein. Sonographic evaluation the left groin demonstrates wide patency of the left common femoral vein. As such, the left common femoral vein was selected venous access. The left groin was prepped and draped in the usual sterile fashion, and a sterile drape was applied covering the operative field. Maximum barrier sterile technique with sterile gowns and gloves were used for the procedure. A timeout was performed prior to the initiation of the procedure. Local anesthesia was provided with 1% lidocaine. Under direct ultrasound guidance, the right common femoral vein was accessed with a micro puncture kit ultimately allowing placement of a 6 French, 35 cm vascular sheath. Slightly cranial to this initial access, the right common femoral was again accessed with an additional micropuncture kit ultimately allowing placement of an additional 7 French, 35 cm vascular sheath. Ultrasound images were saved for procedural documentation purposes. With the use of a stiff glidewire, a vertebral catheter was advanced into the main pulmonary artery and a limited central pulmonary arteriogram was performed. Pressure measurements were then obtained from the main pulmonary artery. The vertebral catheter was advanced into the distal branch of the left lower lobe pulmonary artery. Limited contrast injection confirmed appropriate positioning. Over an exchange length Rosen wire, the vertebral catheter was exchanged for a 90/10 cm multi side-hole infusion catheter. Again, with the use of a stiff glidewire, a vertebral catheter was advanced into a distal branch of the right lower lobe pulmonary artery. Limited contrast injection confirmed appropriate positioning. Over an exchange length Rosen wire, the pigtail  catheter was exchanged for a 90/15 cm multi side-hole infusion catheter. A postprocedural fluoroscopic image was obtained to document final catheter positioning. Both vascular sheath were secured at the left groin. The external catheter tubing was secured at the right thigh and the lytic therapy was initiated. The patient tolerated the procedure well without immediate postprocedural complication. FINDINGS: Central pulmonary arteriogram demonstrates significant near occlusive thrombus involving the peripheral aspects of both the right and left main pulmonary arteries with marked dilatation of the central pulmonary arterial system. Additionally, limited right main pulmonary arteriogram demonstrates near occlusive thrombus within the peripheral aspect of the right main pulmonary artery. Acquired pressure measurements: Main  pulmonary artery-61/16; mean-40 (normal: < 25/10) Following the procedure, both ultrasound assisted infusion catheter tips terminate within the distal aspects of the bilateral lower lobe sub segmental pulmonary arteries. IMPRESSION: 1. Successful fluoroscopic guided initiation of bilateral ultrasound assisted catheter directed pulmonary arterial lysis for sub massive pulmonary embolism and right-sided heart strain. 2. Elevated pressure measurements within the main pulmonary artery compatible with critical pulmonary arterial hypertension. 3. Near occlusive DVT noted within the right common femoral vein. PLAN: Above findings discussed with Dr. Hart Robinsons (critical care) at the time of procedure completion and the decision made to proceed with IVC filter placement for temporary caval interruption purposes at the time of post lysis pressure measurement acquisition. Electronically Signed   By: Sandi Mariscal M.D.   On: 08/31/2019 08:24   IR US Guide Vasc Access Left  Result Date: 08/31/2019 INDICATION: Sub massive pulmonary embolism. Request made for initiation of bilateral catheter directed pulmonary  arterial thrombolysis. EXAM: 1. ULTRASOUND GUIDANCE FOR VENOUS ACCESS X2 2. PULMONARY ARTERIOGRAPHY 3. FLUOROSCOPIC GUIDED PLACEMENT OF BILATERAL PULMONARY ARTERIAL LYTIC INFUSION CATHETERS COMPARISON:  Chest CTA-earlier same day MEDICATIONS: None ANESTHESIA/SEDATION: Moderate (conscious) sedation was employed during this procedure. A total of Versed 2.5 mg and Fentanyl 100 mcg was administered intravenously. Moderate Sedation Time: 33 minutes. The patient's level of consciousness and vital signs were monitored continuously by radiology nursing throughout the procedure under my direct supervision. CONTRAST:  11m OMNIPAQUE IOHEXOL 300 MG/ML  SOLN FLUOROSCOPY TIME:  9 minutes, 18 seconds (1711mGy) COMPLICATIONS: None immediate. TECHNIQUE: Informed written consent was obtained from the patient after a discussion of the risks, benefits and alternatives to treatment. Questions regarding the procedure were encouraged and answered. A timeout was performed prior to the initiation of the procedure. Ultrasound scanning was performed of the right groin and demonstrated near occlusive thrombus within the right common femoral vein. Sonographic evaluation the left groin demonstrates wide patency of the left common femoral vein. As such, the left common femoral vein was selected venous access. The left groin was prepped and draped in the usual sterile fashion, and a sterile drape was applied covering the operative field. Maximum barrier sterile technique with sterile gowns and gloves were used for the procedure. A timeout was performed prior to the initiation of the procedure. Local anesthesia was provided with 1% lidocaine. Under direct ultrasound guidance, the right common femoral vein was accessed with a micro puncture kit ultimately allowing placement of a 6 French, 35 cm vascular sheath. Slightly cranial to this initial access, the right common femoral was again accessed with an additional micropuncture kit ultimately  allowing placement of an additional 7 French, 35 cm vascular sheath. Ultrasound images were saved for procedural documentation purposes. With the use of a stiff glidewire, a vertebral catheter was advanced into the main pulmonary artery and a limited central pulmonary arteriogram was  performed. Pressure measurements were then obtained from the main pulmonary artery. The vertebral catheter was advanced into the distal branch of the left lower lobe pulmonary artery. Limited contrast injection confirmed appropriate positioning. Over an exchange length Rosen wire, the vertebral catheter was exchanged for a 90/10 cm multi side-hole infusion catheter. Again, with the use of a stiff glidewire, a vertebral catheter was advanced into a distal branch of the right lower lobe pulmonary artery. Limited contrast injection confirmed appropriate positioning. Over an exchange length Rosen wire, the pigtail catheter was exchanged for a 90/15 cm multi side-hole infusion catheter. A postprocedural fluoroscopic image was obtained to document final catheter positioning. Both vascular sheath were secured at the left groin. The external catheter tubing was secured at the right thigh and the lytic therapy was initiated. The patient tolerated the procedure well without immediate postprocedural complication. FINDINGS: Central pulmonary arteriogram demonstrates significant near occlusive thrombus involving the peripheral aspects of both the right and left main pulmonary arteries with marked dilatation of the central pulmonary arterial system. Additionally, limited right main pulmonary arteriogram demonstrates near occlusive thrombus within the peripheral aspect of the right main pulmonary artery. Acquired pressure measurements: Main  pulmonary artery-61/16; mean-40 (normal: < 25/10) Following the procedure, both ultrasound assisted infusion catheter tips terminate within the distal aspects of the bilateral lower lobe sub segmental pulmonary  arteries. IMPRESSION: 1. Successful fluoroscopic guided initiation of bilateral ultrasound assisted catheter directed pulmonary arterial lysis for sub massive pulmonary embolism and right-sided heart strain. 2. Elevated pressure measurements within the main pulmonary artery compatible with critical pulmonary arterial hypertension. 3. Near occlusive DVT noted within the right common femoral vein. PLAN: Above findings discussed with Dr. Hart Robinsons (critical care) at the time of procedure completion and the decision made to proceed with IVC filter placement for temporary caval interruption purposes at the time of post lysis pressure measurement acquisition. Electronically Signed   By: Sandi Mariscal M.D.   On: 08/31/2019 08:24   ECHOCARDIOGRAM COMPLETE  Result Date: 08/30/2019    ECHOCARDIOGRAM REPORT   Patient Name:   Joe Cantu Date of Exam: 08/30/2019 Medical Rec #:  [id number]     Height:       75.0 in Accession #:    [id number]    Weight:       172.0 lb Date of Birth:  28-Jul-1950     BSA:          2.06 m Patient Age:    22 years      BP:           122/87 mmHg Patient Gender: M             HR:           98 bpm. Exam Location:  Inpatient Procedure: 2D Echo, Cardiac Doppler and Color Doppler STAT ECHO Indications:    R55 Syncope  History:        Patient has no prior history of Echocardiogram examinations.                 Aortic Rood Dilatation.  Sonographer:    Jonelle Sidle Dance Referring Phys: [id number] [place]  1. Severe RA/RV dilation with reduced RV function and severe pulmonary hypertension. LV hyperdynamic and borderline small cavity/underfilled. No hemodynamically significant valve disease.  2. Left ventricular ejection fraction, by estimation, is 65 to 70%. The left ventricle has hyperdynamic function. The left ventricle has no regional wall motion abnormalities. There is moderate concentric left ventricular hypertrophy.  Left ventricular diastolic function could not be evaluated. There is  the interventricular septum is flattened in systole and diastole, consistent with right ventricular pressure and volume overload.  3. Right ventricular systolic function is moderately reduced. The right ventricular size is severely enlarged. There is severely elevated pulmonary artery systolic pressure. The estimated right ventricular systolic pressure is 07.6 mmHg.  4. Right atrial size was severely dilated.  5. The mitral valve is normal in structure and function. Trivial mitral valve regurgitation. No evidence of mitral stenosis.  6. Tricuspid valve regurgitation is moderate.  7. The aortic valve is tricuspid. Aortic valve regurgitation is trivial. No aortic stenosis is present.  8. Aortic dilatation noted. There is mild dilatation of the ascending aorta measuring 37 mm.  9. Mildly dilated pulmonary artery. 10. The inferior vena cava is dilated in size with <50% respiratory variability, suggesting right atrial pressure of 15 mmHg. 11. Cannot exclude IAS shunt. Comparison(s): No prior Echocardiogram. FINDINGS  Left Ventricle: Left ventricular ejection fraction, by estimation, is 65 to 70%. The left ventricle has hyperdynamic function. The left ventricle has no regional wall motion abnormalities. The left ventricular internal cavity size was small. There is moderate concentric left ventricular hypertrophy. The interventricular septum is flattened in systole and diastole, consistent with right ventricular pressure and volume overload. Left ventricular diastolic function could not be evaluated. Right Ventricle: The right ventricular size is severely enlarged. Right vetricular wall thickness was not assessed. Right ventricular systolic function is moderately reduced. There is severely elevated pulmonary artery systolic pressure. The tricuspid regurgitant velocity is 3.91 m/s, and with an assumed right atrial pressure of 15 mmHg, the estimated right ventricular systolic pressure is 22.6 mmHg. Left Atrium: Left atrial  size was normal in size. Right Atrium: Right atrial size was severely dilated. Pericardium: There is no evidence of pericardial effusion. Mitral Valve: The mitral valve is normal in structure and function. Trivial mitral valve regurgitation. No evidence of mitral valve stenosis. Tricuspid Valve: The tricuspid valve is normal in structure. Tricuspid valve regurgitation is moderate . No evidence of tricuspid stenosis. Aortic Valve: The aortic valve is tricuspid. Aortic valve regurgitation is trivial. No aortic stenosis is present. Pulmonic Valve: The pulmonic valve was grossly normal. Pulmonic valve regurgitation is mild. No evidence of pulmonic stenosis. Aorta: Aortic dilatation noted. There is mild dilatation of the ascending aorta measuring 37 mm. Pulmonary Artery: The pulmonary artery is mildly dilated. Venous: The inferior vena cava is dilated in size with less than 50% respiratory variability, suggesting right atrial pressure of 15 mmHg. IAS/Shunts: Cannot exclude IAS shunt. Additional Comments: There is a small pleural effusion in both left and right lateral regions.  LEFT VENTRICLE PLAX 2D LVIDd:         3.33 cm LVIDs:         2.20 cm LV PW:         1.75 cm LV IVS:        2.02 cm LVOT diam:     2.40 cm LV SV:         72.61 ml LV SV Index:   14.28 LVOT Area:     4.52 cm  RIGHT VENTRICLE          IVC RV Basal diam:  4.20 cm  IVC diam: 2.20 cm RV Mid diam:    3.00 cm TAPSE (M-mode): 1.4 cm LEFT ATRIUM             Index       RIGHT ATRIUM  Index LA diam:        3.40 cm 1.65 cm/m  RA Area:     39.50 cm LA Vol (A2C):   42.1 ml 20.46 ml/m RA Volume:   182.50 ml 88.67 ml/m LA Vol (A4C):   43.0 ml 20.89 ml/m LA Biplane Vol: 42.4 ml 20.60 ml/m  AORTIC VALVE LVOT Vmax:   102.40 cm/s LVOT Vmean:  57.500 cm/s LVOT VTI:    0.160 m  AORTA Ao Root diam: 4.20 cm Ao Asc diam:  3.70 cm MV A velocity: 78.45 cm/s  TRICUSPID VALVE                            TR Peak grad:   61.2 mmHg                            TR  Vmax:        391.00 cm/s                             SHUNTS                            Systemic VTI:  0.16 m                            Systemic Diam: 2.40 cm Buford Dresser MD Electronically signed by Buford Dresser MD Signature Date/Time: 08/30/2019/12:48:37 PM    Final    VAS [place] LOWER EXTREMITY VENOUS (DVT)  Result Date: 08/30/2019  Lower Venous DVTStudy Indications: Pulmonary embolism, and SOB.  Comparison Study: No prior exam. Performing Technologist: Baldwin Crown ARDMS, RVT  Examination Guidelines: A complete evaluation includes B-mode imaging, spectral Doppler, color Doppler, and power Doppler as needed of all accessible portions of each vessel. Bilateral testing is considered an integral part of a complete examination. Limited examinations for reoccurring indications may be performed as noted. The reflux portion of the exam is performed with the patient in reverse Trendelenburg.  +---------+---------------+---------+-----------+----------+---------------+ RIGHT    CompressibilityPhasicitySpontaneityPropertiesThrombus Aging  +---------+---------------+---------+-----------+----------+---------------+ CFV      Partial        Yes      Yes                                  +---------+---------------+---------+-----------+----------+---------------+ SFJ      None                                                         +---------+---------------+---------+-----------+----------+---------------+ FV Prox  Full                                                         +---------+---------------+---------+-----------+----------+---------------+ FV Mid   Full                                                         +---------+---------------+---------+-----------+----------+---------------+  FV DistalFull                                                         +---------+---------------+---------+-----------+----------+---------------+ PFV      Full                                                          +---------+---------------+---------+-----------+----------+---------------+ POP      Full           Yes      Yes                                  +---------+---------------+---------+-----------+----------+---------------+ PTV      Full                                         seen with color +---------+---------------+---------+-----------+----------+---------------+ PERO     Full                                         seen with color +---------+---------------+---------+-----------+----------+---------------+ EIV      Full           Yes      Yes                                  +---------+---------------+---------+-----------+----------+---------------+   +---------+---------------+---------+-----------+----------+---------------+ LEFT     CompressibilityPhasicitySpontaneityPropertiesThrombus Aging  +---------+---------------+---------+-----------+----------+---------------+ CFV      None           No       No                                   +---------+---------------+---------+-----------+----------+---------------+ SFJ      Full                                                         +---------+---------------+---------+-----------+----------+---------------+ FV Prox  Full                                                         +---------+---------------+---------+-----------+----------+---------------+ FV Mid   Full                                                         +---------+---------------+---------+-----------+----------+---------------+  FV DistalNone                                                         +---------+---------------+---------+-----------+----------+---------------+ PFV      Full                                                         +---------+---------------+---------+-----------+----------+---------------+ POP      None           No       No                                    +---------+---------------+---------+-----------+----------+---------------+ PTV      Full                                         seen with color +---------+---------------+---------+-----------+----------+---------------+ PERO     Full                                         seen with color +---------+---------------+---------+-----------+----------+---------------+ EIV      Full                                                         +---------+---------------+---------+-----------+----------+---------------+ Poorly visusalized bilteral calf veins, visualized with color flow.    Summary: RIGHT: - Findings consistent with acute deep vein thrombosis involving the SF junction, and right common femoral vein. - Findings consistent with acute superficial vein thrombosis involving the right great saphenous vein.  LEFT: - Findings consistent with acute deep vein thrombosis involving the left common femoral vein, left femoral vein, and left popliteal vein.  *See table(s) above for measurements and observations. Electronically signed by Harold Barban MD on 08/30/2019 at 6:38:23 PM.    Final    IR INFUSION THROMBOL ARTERIAL INITIAL (MS)  Result Date: 08/31/2019 INDICATION: Sub massive pulmonary embolism. Request made for initiation of bilateral catheter directed pulmonary arterial thrombolysis. EXAM: 1. ULTRASOUND GUIDANCE FOR VENOUS ACCESS X2 2. PULMONARY ARTERIOGRAPHY 3. FLUOROSCOPIC GUIDED PLACEMENT OF BILATERAL PULMONARY ARTERIAL LYTIC INFUSION CATHETERS COMPARISON:  Chest CTA-earlier same day MEDICATIONS: None ANESTHESIA/SEDATION: Moderate (conscious) sedation was employed during this procedure. A total of Versed 2.5 mg and Fentanyl 100 mcg was administered intravenously. Moderate Sedation Time: 33 minutes. The patient's level of consciousness and vital signs were monitored continuously by radiology nursing throughout the procedure under my direct supervision.  CONTRAST:  38m OMNIPAQUE IOHEXOL 300 MG/ML  SOLN FLUOROSCOPY TIME:  9 minutes, 18 seconds (1810mGy) COMPLICATIONS: None immediate. TECHNIQUE: Informed written consent was obtained from the patient after a discussion of the risks, benefits and alternatives to treatment. Questions regarding the procedure were encouraged and answered. A timeout was  performed prior to the initiation of the procedure. Ultrasound scanning was performed of the right groin and demonstrated near occlusive thrombus within the right common femoral vein. Sonographic evaluation the left groin demonstrates wide patency of the left common femoral vein. As such, the left common femoral vein was selected venous access. The left groin was prepped and draped in the usual sterile fashion, and a sterile drape was applied covering the operative field. Maximum barrier sterile technique with sterile gowns and gloves were used for the procedure. A timeout was performed prior to the initiation of the procedure. Local anesthesia was provided with 1% lidocaine. Under direct ultrasound guidance, the right common femoral vein was accessed with a micro puncture kit ultimately allowing placement of a 6 French, 35 cm vascular sheath. Slightly cranial to this initial access, the right common femoral was again accessed with an additional micropuncture kit ultimately allowing placement of an additional 7 French, 35 cm vascular sheath. Ultrasound images were saved for procedural documentation purposes. With the use of a stiff glidewire, a vertebral catheter was advanced into the main pulmonary artery and a limited central pulmonary arteriogram was performed. Pressure measurements were then obtained from the main pulmonary artery. The vertebral catheter was advanced into the distal branch of the left lower lobe pulmonary artery. Limited contrast injection confirmed appropriate positioning. Over an exchange length Rosen wire, the vertebral catheter was exchanged for a  90/10 cm multi side-hole infusion catheter. Again, with the use of a stiff glidewire, a vertebral catheter was advanced into a distal branch of the right lower lobe pulmonary artery. Limited contrast injection confirmed appropriate positioning. Over an exchange length Rosen wire, the pigtail catheter was exchanged for a 90/15 cm multi side-hole infusion catheter. A postprocedural fluoroscopic image was obtained to document final catheter positioning. Both vascular sheath were secured at the left groin. The external catheter tubing was secured at the right thigh and the lytic therapy was initiated. The patient tolerated the procedure well without immediate postprocedural complication. FINDINGS: Central pulmonary arteriogram demonstrates significant near occlusive thrombus involving the peripheral aspects of both the right and left main pulmonary arteries with marked dilatation of the central pulmonary arterial system. Additionally, limited right main pulmonary arteriogram demonstrates near occlusive thrombus within the peripheral aspect of the right main pulmonary artery. Acquired pressure measurements: Main  pulmonary artery-61/16; mean-40 (normal: < 25/10) Following the procedure, both ultrasound assisted infusion catheter tips terminate within the distal aspects of the bilateral lower lobe sub segmental pulmonary arteries. IMPRESSION: 1. Successful fluoroscopic guided initiation of bilateral ultrasound assisted catheter directed pulmonary arterial lysis for sub massive pulmonary embolism and right-sided heart strain. 2. Elevated pressure measurements within the main pulmonary artery compatible with critical pulmonary arterial hypertension. 3. Near occlusive DVT noted within the right common femoral vein. PLAN: Above findings discussed with Dr. Hart Robinsons (critical care) at the time of procedure completion and the decision made to proceed with IVC filter placement for temporary caval interruption purposes at the time  of post lysis pressure measurement acquisition. Electronically Signed   By: Sandi Mariscal M.D.   On: 08/31/2019 08:24   IR INFUSION THROMBOL ARTERIAL INITIAL (MS)  Result Date: 08/31/2019 INDICATION: Sub massive pulmonary embolism. Request made for initiation of bilateral catheter directed pulmonary arterial thrombolysis. EXAM: 1. ULTRASOUND GUIDANCE FOR VENOUS ACCESS X2 2. PULMONARY ARTERIOGRAPHY 3. FLUOROSCOPIC GUIDED PLACEMENT OF BILATERAL PULMONARY ARTERIAL LYTIC INFUSION CATHETERS COMPARISON:  Chest CTA-earlier same day MEDICATIONS: None ANESTHESIA/SEDATION: Moderate (conscious) sedation was employed during this procedure. A  total of Versed 2.5 mg and Fentanyl 100 mcg was administered intravenously. Moderate Sedation Time: 33 minutes. The patient's level of consciousness and vital signs were monitored continuously by radiology nursing throughout the procedure under my direct supervision. CONTRAST:  28m OMNIPAQUE IOHEXOL 300 MG/ML  SOLN FLUOROSCOPY TIME:  9 minutes, 18 seconds (1646mGy) COMPLICATIONS: None immediate. TECHNIQUE: Informed written consent was obtained from the patient after a discussion of the risks, benefits and alternatives to treatment. Questions regarding the procedure were encouraged and answered. A timeout was performed prior to the initiation of the procedure. Ultrasound scanning was performed of the right groin and demonstrated near occlusive thrombus within the right common femoral vein. Sonographic evaluation the left groin demonstrates wide patency of the left common femoral vein. As such, the left common femoral vein was selected venous access. The left groin was prepped and draped in the usual sterile fashion, and a sterile drape was applied covering the operative field. Maximum barrier sterile technique with sterile gowns and gloves were used for the procedure. A timeout was performed prior to the initiation of the procedure. Local anesthesia was provided with 1% lidocaine. Under  direct ultrasound guidance, the right common femoral vein was accessed with a micro puncture kit ultimately allowing placement of a 6 French, 35 cm vascular sheath. Slightly cranial to this initial access, the right common femoral was again accessed with an additional micropuncture kit ultimately allowing placement of an additional 7 French, 35 cm vascular sheath. Ultrasound images were saved for procedural documentation purposes. With the use of a stiff glidewire, a vertebral catheter was advanced into the main pulmonary artery and a limited central pulmonary arteriogram was performed. Pressure measurements were then obtained from the main pulmonary artery. The vertebral catheter was advanced into the distal branch of the left lower lobe pulmonary artery. Limited contrast injection confirmed appropriate positioning. Over an exchange length Rosen wire, the vertebral catheter was exchanged for a 90/10 cm multi side-hole infusion catheter. Again, with the use of a stiff glidewire, a vertebral catheter was advanced into a distal branch of the right lower lobe pulmonary artery. Limited contrast injection confirmed appropriate positioning. Over an exchange length Rosen wire, the pigtail catheter was exchanged for a 90/15 cm multi side-hole infusion catheter. A postprocedural fluoroscopic image was obtained to document final catheter positioning. Both vascular sheath were secured at the left groin. The external catheter tubing was secured at the right thigh and the lytic therapy was initiated. The patient tolerated the procedure well without immediate postprocedural complication. FINDINGS: Central pulmonary arteriogram demonstrates significant near occlusive thrombus involving the peripheral aspects of both the right and left main pulmonary arteries with marked dilatation of the central pulmonary arterial system. Additionally, limited right main pulmonary arteriogram demonstrates near occlusive thrombus within the  peripheral aspect of the right main pulmonary artery. Acquired pressure measurements: Main  pulmonary artery-61/16; mean-40 (normal: < 25/10) Following the procedure, both ultrasound assisted infusion catheter tips terminate within the distal aspects of the bilateral lower lobe sub segmental pulmonary arteries. IMPRESSION: 1. Successful fluoroscopic guided initiation of bilateral ultrasound assisted catheter directed pulmonary arterial lysis for sub massive pulmonary embolism and right-sided heart strain. 2. Elevated pressure measurements within the main pulmonary artery compatible with critical pulmonary arterial hypertension. 3. Near occlusive DVT noted within the right common femoral vein. PLAN: Above findings discussed with Dr. PHart Robinsons(critical care) at the time of procedure completion and the decision made to proceed with IVC filter placement for temporary caval interruption purposes at  the time of post lysis pressure measurement acquisition. Electronically Signed   By: Sandi Mariscal M.D.   On: 08/31/2019 08:24   IR THROMB F/U EVAL ART/VEN FINAL DAY (MS)  Result Date: 08/31/2019 CLINICAL DATA:  Bilateral submassive pulmonary embolism with cor pulmonale. Status post thrombo lytic infusion via each pulmonary artery for 12 hours. Request has also been made to place an IVC filter on completion thrombo lytic therapy given residual DVT in both lower extremities. EXAM: 1. FINAL DAY ARTERIAL THROMBOLYTIC THERAPY PRESSURE MEASUREMENT. 2. IVC VENOGRAM. 3. PERCUTANEOUS IVC FILTER PLACEMENT. ANESTHESIA/SEDATION: 50 mcg IV Fentanyl. The patient was just given fentanyl and not formally sedated. CONTRAST:  15m OMNIPAQUE IOHEXOL 300 MG/ML SOLN, 174mOMNIPAQUE IOHEXOL 300 MG/ML SOLN, 1021mMNIPAQUE IOHEXOL 300 MG/ML SOLN FLUOROSCOPY TIME:  1 minutes and 30 seconds.  146.6 mGy. PROCEDURE: The procedure, risks, benefits, and alternatives were explained to the patient. Questions regarding the procedure were encouraged and  answered. The patient understands and consents to the procedure. A time-out was performed prior to initiating the procedure. Pulmonary artery pressures were directly measured through the pulmonary arterial infusion catheters. The catheters were then removed. Left femoral vein sheaths placed yesterday were prepped and draped. Local anesthesia was provided with 1% Lidocaine. A guidewire was advanced into the inferior vena cava via one of the sheaths. This sheath was removed and the venotomy dilated. A 10 French deployment sheath was advanced over the guidewire. This was utilized to perform IVC venography. The deployment sheath was further positioned in an appropriate location for filter deployment. A Bard Denali IVC filter was then advanced in the sheath. This was then fully deployed in the infrarenal IVC. Final filter position was confirmed with a fluoroscopic spot image. After the procedure the sheath was removed and hemostasis obtained with manual compression. COMPLICATIONS: None. FINDINGS: Measure pulmonary artery pressure is 55/20, mean 28 mm Hg compared to 61/16, 40 mm Hg prior to lytic therapy. IVC venography demonstrates a normal caliber IVC with no evidence of thrombus. Renal veins are identified bilaterally. The IVC filter was successfully positioned below the level of the renal veins and is appropriately oriented. This IVC filter has both permanent and retrievable indications. IMPRESSION: 1. Decrease in measured pulmonary artery pressure after 12 hours of bilateral pulmonary arterial thrombolytic therapy. 2. Placement of percutaneous IVC filter in infrarenal IVC. IVC venogram shows no evidence of IVC thrombus and normal caliber of the inferior vena cava. This filter does have both permanent and retrievable indications. PLAN: This IVC filter is potentially retrievable. The patient will be assessed for filter retrieval by Interventional Radiology in approximately 8-12 weeks. Further recommendations regarding  filter retrieval, continued surveillance or declaration of device permanence, will be made at that time. Electronically Signed   By: GleAletta EdouardD.   On: 08/31/2019 14:25    Labs:  CBC: Recent Labs    08/30/19 0445 08/30/19 0445 08/30/19 0453 08/31/19 0019 08/31/19 0430 09/01/19 0451  WBC 10.0  --   --  12.2* 10.5 8.4  HGB 12.8*   < > 12.9* 12.2* 11.0* 10.8*  HCT 38.3*   < > 38.0* 36.6* 32.8* 32.3*  PLT 296  --   --  254 263 208   < > = values in this interval not displayed.    COAGS: Recent Labs    08/30/19 0656  INR 1.2  APTT 28    BMP: Recent Labs    08/30/19 0445 08/30/19 0453 08/31/19 0430  NA 139 140 140  K 4.4 4.3 4.2  CL 108 107 113*  CO2 22  --  18*  GLUCOSE 130* 123* 119*  BUN 21 18 24*  CALCIUM 10.5*  --  9.2  CREATININE 0.83 0.80 0.84  GFRNONAA >60  --  >60  GFRAA >60  --  >60    LIVER FUNCTION TESTS: Recent Labs    08/30/19 0445 08/31/19 0430  BILITOT 0.9 1.3*  AST 87* 57*  ALT 79* 73*  ALKPHOS 89 76  PROT 7.6 5.9*  ALBUMIN 3.5 2.7*    Assessment and Plan:  Submassive Pulmonary Embolism, Bilateral Lower Extremity DVTs - s/p bilateral pulmonary arterial infusion catheter insertion (2/17) and removal (2/18) after pulmonary arterial pressures came down. Also s/p IVC filter placement (2/18)  Patient appears well today. He still complains of some occasional SOB, but reports it has improved since admission. He denies any bleeding or pain at left femoral vein insertion site. Labs and vitals are WNL.   Continue patient on heparin drip with eventual transition to oral AC and monitor him closely. Patient will be transferred to the floor per internal medicine.   Electronically Signed: D. Rowe Robert, PA-C / Donnamarie Rossetti, PA-S 09/01/2019, 10:52 AM   I spent a total of 15 Minutes at the the patient's bedside AND on the patient's hospital floor or unit, greater than 50% of which was counseling/coordinating care for PE lysis and IVC  filter placement     Patient ID: Joe Cantu, male   DOB: May 08, 1951, 69 y.o.   MRN: [id number]

## 2019-09-01 NOTE — Progress Notes (Signed)
PROGRESS NOTE    Joe Cantu  BZJ:[id number] DOB: 1950/10/02 DOA: 08/30/2019 PCP: Iona Beard, MD     Brief Narrative:  69 year old man admitted from home on 2/17 with complaint of a syncopal episode after urinating.  CT angiogram of the chest was significant for massive PE with complete occlusion of the right main pulmonary artery with absent arterial blood flow to the right lung and additionally to partially occlusive filling defects of the left lobe pulmonary arteries.  Subsequent 2D echo showed severe RA/RV dilatation with reduced RV function and severe pulmonary hypertension.  Left ventricular ejection fraction was 65 to 70%.  With these findings, critical care was consulted on 2/17 and he proceeded to have catheter directed TPA that day.  He remains on a heparin drip with plans to continue for another 24 hours until 2/20.  He also had an IVC filter placed on 2/18 due to significant lower extremity DVTs as he was felt at risk for continued respiratory decompensation if those clots were to travel.  He has now been transferred to the hospitalist service as of 2/19.   Assessment & Plan:   Principal Problem:   Acute pulmonary embolism (HCC) Active Problems:   Palpitations   Frequent unifocal PVCs   Acute massive PE and left lower extremity deep vein thrombosis with significant right heart dysfunction -He received TPA, is on a heparin drip with plans to continue it until 2/20 before transitioning to oral anticoagulation which should be lifelong in the setting of a massive thromboembolic phenomenon that was unprovoked. -He is currently hemodynamically stable, he describes some mild shortness of breath with exertion. -He will be transferred to the floor today, PT/OT consultations will be obtained.  Syncope -Secondary to large acute PE and resultant hypoxia -Watch on telemetry for arrhythmias.  Frequent PVCs -Continue metoprolol 50 mg twice daily.   DVT prophylaxis: IV heparin Code  Status: Full code Family Communication: Patient only Disposition Plan: Transfer to floor, PT/OT consultations, anticipate discharge home over next 48 hours.  Consultants:   Critical care medicine  Interventional radiology  Procedures:   Catheter directed TPA on 2/17  IVC filter placement 2/18.  Antimicrobials:  Anti-infectives (From admission, onward)   None       Subjective: Patient lying in bed, is pleasant awake and conversant.  He denies chest pain or shortness of breath, is wondering if he will be able to go back to work eventually.  Objective: Vitals:   09/01/19 0800 09/01/19 0806 09/01/19 0830 09/01/19 0900  BP: (!) 148/89   136/72  Pulse: 70  87 82  Resp:      Temp:  97.6 F (36.4 C)    TempSrc:  Oral    SpO2: 95%  97% 97%  Weight:      Height:        Intake/Output Summary (Last 24 hours) at 09/01/2019 0923 Last data filed at 09/01/2019 0900 Gross per 24 hour  Intake 4591.81 ml  Output 1300 ml  Net 3291.81 ml   Filed Weights   08/30/19 0411  Weight: 78 kg    Examination:  General exam: Alert, awake, oriented x 3 Respiratory system: Clear to auscultation. Respiratory effort normal. Cardiovascular system:RRR. No murmurs, rubs, gallops. Gastrointestinal system: Abdomen is nondistended, soft and nontender. No organomegaly or masses felt. Normal bowel sounds heard. Central nervous system: Alert and oriented. No focal neurological deficits. Extremities: No C/C/E, +pedal pulses Skin: No rashes, lesions or ulcers Psychiatry: Judgement and insight appear normal. Mood &  affect appropriate.     Data Reviewed: I have personally reviewed following labs and imaging studies  CBC: Recent Labs  Lab 08/30/19 0445 08/30/19 0453 08/31/19 0019 08/31/19 0430 09/01/19 0451  WBC 10.0  --  12.2* 10.5 8.4  NEUTROABS 7.8*  --   --   --   --   HGB 12.8* 12.9* 12.2* 11.0* 10.8*  HCT 38.3* 38.0* 36.6* 32.8* 32.3*  MCV 93.4  --  95.1 93.7 94.2  PLT 296  --  254  263 676   Basic Metabolic Panel: Recent Labs  Lab 08/30/19 0445 08/30/19 0453 08/31/19 0430  NA 139 140 140  K 4.4 4.3 4.2  CL 108 107 113*  CO2 22  --  18*  GLUCOSE 130* 123* 119*  BUN 21 18 24*  CREATININE 0.83 0.80 0.84  CALCIUM 10.5*  --  9.2   GFR: Estimated Creatinine Clearance: 95.4 mL/min (by C-G formula based on SCr of 0.84 mg/dL). Liver Function Tests: Recent Labs  Lab 08/30/19 0445 08/31/19 0430  AST 87* 57*  ALT 79* 73*  ALKPHOS 89 76  BILITOT 0.9 1.3*  PROT 7.6 5.9*  ALBUMIN 3.5 2.7*   No results for input(s): LIPASE, AMYLASE in the last 168 hours. No results for input(s): AMMONIA in the last 168 hours. Coagulation Profile: Recent Labs  Lab 08/30/19 0656  INR 1.2   Cardiac Enzymes: No results for input(s): CKTOTAL, CKMB, CKMBINDEX, TROPONINI in the last 168 hours. BNP (last 3 results) No results for input(s): PROBNP in the last 8760 hours. HbA1C: No results for input(s): HGBA1C in the last 72 hours. CBG: Recent Labs  Lab 08/30/19 0422  GLUCAP 112*   Lipid Profile: No results for input(s): CHOL, HDL, LDLCALC, TRIG, CHOLHDL, LDLDIRECT in the last 72 hours. Thyroid Function Tests: No results for input(s): TSH, T4TOTAL, FREET4, T3FREE, THYROIDAB in the last 72 hours. Anemia Panel: No results for input(s): VITAMINB12, FOLATE, FERRITIN, TIBC, IRON, RETICCTPCT in the last 72 hours. Urine analysis: No results found for: COLORURINE, APPEARANCEUR, LABSPEC, [place], GLUCOSEU, HGBUR, BILIRUBINUR, KETONESUR, PROTEINUR, UROBILINOGEN, NITRITE, LEUKOCYTESUR Sepsis Labs: _0 (procalcitonin:4,lacticidven:4)  ) Recent Results (from the past 240 hour(s))  SARS CORONAVIRUS 2 (TAT 6-24 HRS) Nasopharyngeal Nasopharyngeal Swab     Status: None   Collection Time: 08/30/19  7:48 AM   Specimen: Nasopharyngeal Swab  Result Value Ref Range Status   SARS Coronavirus 2 NEGATIVE NEGATIVE Final    Comment: (NOTE) SARS-CoV-2 target nucleic acids are NOT  DETECTED. The SARS-CoV-2 RNA is generally detectable in upper and lower respiratory specimens during the acute phase of infection. Negative results do not preclude SARS-CoV-2 infection, do not rule out co-infections with other pathogens, and should not be used as the sole basis for treatment or other patient management decisions. Negative results must be combined with clinical observations, patient history, and epidemiological information. The expected result is Negative. Fact Sheet for Patients: SugarRoll.be Fact Sheet for Healthcare Providers: https://www.woods-mathews.com/ This test is not yet approved or cleared by the [place] FDA and  has been authorized for detection and/or diagnosis of SARS-CoV-2 by FDA under an Emergency Use Authorization (EUA). This EUA will remain  in effect (meaning this test can be used) for the duration of the COVID-19 declaration under Section 56 4(b)(1) of the Act, 21 U.S.C. section 360bbb-3(b)(1), unless the authorization is terminated or revoked sooner. Performed at [facility], [place] [address] [place], [place] [address]   MRSA PCR Screening     Status: None  Collection Time: 08/30/19 12:35 PM   Specimen: Nasal Mucosa; Nasopharyngeal  Result Value Ref Range Status   MRSA by PCR NEGATIVE NEGATIVE Final    Comment:        The GeneXpert MRSA Assay (FDA approved for NASAL specimens only), is one component of a comprehensive MRSA colonization surveillance program. It is not intended to diagnose MRSA infection nor to guide or monitor treatment for MRSA infections. Performed at [facility], [place] [address], [place], [place] [address]          Radiology Studies: IR Angiogram Pulmonary Bilateral Selective  Result Date: 08/31/2019 INDICATION: Sub massive pulmonary embolism. Request made for initiation of bilateral catheter directed pulmonary arterial thrombolysis. EXAM: 1.  ULTRASOUND GUIDANCE FOR VENOUS ACCESS X2 2. PULMONARY ARTERIOGRAPHY 3. FLUOROSCOPIC GUIDED PLACEMENT OF BILATERAL PULMONARY ARTERIAL LYTIC INFUSION CATHETERS COMPARISON:  Chest CTA-earlier same day MEDICATIONS: None ANESTHESIA/SEDATION: Moderate (conscious) sedation was employed during this procedure. A total of Versed 2.5 mg and Fentanyl 100 mcg was administered intravenously. Moderate Sedation Time: 33 minutes. The patient's level of consciousness and vital signs were monitored continuously by radiology nursing throughout the procedure under my direct supervision. CONTRAST:  16m OMNIPAQUE IOHEXOL 300 MG/ML  SOLN FLUOROSCOPY TIME:  9 minutes, 18 seconds (1119mGy) COMPLICATIONS: None immediate. TECHNIQUE: Informed written consent was obtained from the patient after a discussion of the risks, benefits and alternatives to treatment. Questions regarding the procedure were encouraged and answered. A timeout was performed prior to the initiation of the procedure. Ultrasound scanning was performed of the right groin and demonstrated near occlusive thrombus within the right common femoral vein. Sonographic evaluation the left groin demonstrates wide patency of the left common femoral vein. As such, the left common femoral vein was selected venous access. The left groin was prepped and draped in the usual sterile fashion, and a sterile drape was applied covering the operative field. Maximum barrier sterile technique with sterile gowns and gloves were used for the procedure. A timeout was performed prior to the initiation of the procedure. Local anesthesia was provided with 1% lidocaine. Under direct ultrasound guidance, the right common femoral vein was accessed with a micro puncture kit ultimately allowing placement of a 6 French, 35 cm vascular sheath. Slightly cranial to this initial access, the right common femoral was again accessed with an additional micropuncture kit ultimately allowing placement of an additional 7  French, 35 cm vascular sheath. Ultrasound images were saved for procedural documentation purposes. With the use of a stiff glidewire, a vertebral catheter was advanced into the main pulmonary artery and a limited central pulmonary arteriogram was performed. Pressure measurements were then obtained from the main pulmonary artery. The vertebral catheter was advanced into the distal branch of the left lower lobe pulmonary artery. Limited contrast injection confirmed appropriate positioning. Over an exchange length Rosen wire, the vertebral catheter was exchanged for a 90/10 cm multi side-hole infusion catheter. Again, with the use of a stiff glidewire, a vertebral catheter was advanced into a distal branch of the right lower lobe pulmonary artery. Limited contrast injection confirmed appropriate positioning. Over an exchange length Rosen wire, the pigtail catheter was exchanged for a 90/15 cm multi side-hole infusion catheter. A postprocedural fluoroscopic image was obtained to document final catheter positioning. Both vascular sheath were secured at the left groin. The external catheter tubing was secured at the right thigh and the lytic therapy was initiated. The patient tolerated the procedure well without immediate postprocedural complication. FINDINGS: Central pulmonary arteriogram  demonstrates significant near occlusive thrombus involving the peripheral aspects of both the right and left main pulmonary arteries with marked dilatation of the central pulmonary arterial system. Additionally, limited right main pulmonary arteriogram demonstrates near occlusive thrombus within the peripheral aspect of the right main pulmonary artery. Acquired pressure measurements: Main  pulmonary artery-61/16; mean-40 (normal: < 25/10) Following the procedure, both ultrasound assisted infusion catheter tips terminate within the distal aspects of the bilateral lower lobe sub segmental pulmonary arteries. IMPRESSION: 1. Successful  fluoroscopic guided initiation of bilateral ultrasound assisted catheter directed pulmonary arterial lysis for sub massive pulmonary embolism and right-sided heart strain. 2. Elevated pressure measurements within the main pulmonary artery compatible with critical pulmonary arterial hypertension. 3. Near occlusive DVT noted within the right common femoral vein. PLAN: Above findings discussed with Dr. Hart Robinsons (critical care) at the time of procedure completion and the decision made to proceed with IVC filter placement for temporary caval interruption purposes at the time of post lysis pressure measurement acquisition. Electronically Signed   By: Sandi Mariscal M.D.   On: 08/31/2019 08:24   IR IVC FILTER PLMT / S&I Burke Keels GUID/MOD SED  Result Date: 08/31/2019 CLINICAL DATA:  Bilateral submassive pulmonary embolism with cor pulmonale. Status post thrombo lytic infusion via each pulmonary artery for 12 hours. Request has also been made to place an IVC filter on completion thrombo lytic therapy given residual DVT in both lower extremities. EXAM: 1. FINAL DAY ARTERIAL THROMBOLYTIC THERAPY PRESSURE MEASUREMENT. 2. IVC VENOGRAM. 3. PERCUTANEOUS IVC FILTER PLACEMENT. ANESTHESIA/SEDATION: 50 mcg IV Fentanyl. The patient was just given fentanyl and not formally sedated. CONTRAST:  32m OMNIPAQUE IOHEXOL 300 MG/ML SOLN, 170mOMNIPAQUE IOHEXOL 300 MG/ML SOLN, 1081mMNIPAQUE IOHEXOL 300 MG/ML SOLN FLUOROSCOPY TIME:  1 minutes and 30 seconds.  146.6 mGy. PROCEDURE: The procedure, risks, benefits, and alternatives were explained to the patient. Questions regarding the procedure were encouraged and answered. The patient understands and consents to the procedure. A time-out was performed prior to initiating the procedure. Pulmonary artery pressures were directly measured through the pulmonary arterial infusion catheters. The catheters were then removed. Left femoral vein sheaths placed yesterday were prepped and draped. Local  anesthesia was provided with 1% Lidocaine. A guidewire was advanced into the inferior vena cava via one of the sheaths. This sheath was removed and the venotomy dilated. A 10 French deployment sheath was advanced over the guidewire. This was utilized to perform IVC venography. The deployment sheath was further positioned in an appropriate location for filter deployment. A Bard Denali IVC filter was then advanced in the sheath. This was then fully deployed in the infrarenal IVC. Final filter position was confirmed with a fluoroscopic spot image. After the procedure the sheath was removed and hemostasis obtained with manual compression. COMPLICATIONS: None. FINDINGS: Measure pulmonary artery pressure is 55/20, mean 28 mm Hg compared to 61/16, 40 mm Hg prior to lytic therapy. IVC venography demonstrates a normal caliber IVC with no evidence of thrombus. Renal veins are identified bilaterally. The IVC filter was successfully positioned below the level of the renal veins and is appropriately oriented. This IVC filter has both permanent and retrievable indications. IMPRESSION: 1. Decrease in measured pulmonary artery pressure after 12 hours of bilateral pulmonary arterial thrombolytic therapy. 2. Placement of percutaneous IVC filter in infrarenal IVC. IVC venogram shows no evidence of IVC thrombus and normal caliber of the inferior vena cava. This filter does have both permanent and retrievable indications. PLAN: This IVC filter is potentially retrievable. The patient  will be assessed for filter retrieval by Interventional Radiology in approximately 8-12 weeks. Further recommendations regarding filter retrieval, continued surveillance or declaration of device permanence, will be made at that time. Electronically Signed   By: Aletta Edouard M.D.   On: 08/31/2019 14:25   IR US Guide Vasc Access Left  Result Date: 08/31/2019 INDICATION: Sub massive pulmonary embolism. Request made for initiation of bilateral catheter  directed pulmonary arterial thrombolysis. EXAM: 1. ULTRASOUND GUIDANCE FOR VENOUS ACCESS X2 2. PULMONARY ARTERIOGRAPHY 3. FLUOROSCOPIC GUIDED PLACEMENT OF BILATERAL PULMONARY ARTERIAL LYTIC INFUSION CATHETERS COMPARISON:  Chest CTA-earlier same day MEDICATIONS: None ANESTHESIA/SEDATION: Moderate (conscious) sedation was employed during this procedure. A total of Versed 2.5 mg and Fentanyl 100 mcg was administered intravenously. Moderate Sedation Time: 33 minutes. The patient's level of consciousness and vital signs were monitored continuously by radiology nursing throughout the procedure under my direct supervision. CONTRAST:  35m OMNIPAQUE IOHEXOL 300 MG/ML  SOLN FLUOROSCOPY TIME:  9 minutes, 18 seconds (1482mGy) COMPLICATIONS: None immediate. TECHNIQUE: Informed written consent was obtained from the patient after a discussion of the risks, benefits and alternatives to treatment. Questions regarding the procedure were encouraged and answered. A timeout was performed prior to the initiation of the procedure. Ultrasound scanning was performed of the right groin and demonstrated near occlusive thrombus within the right common femoral vein. Sonographic evaluation the left groin demonstrates wide patency of the left common femoral vein. As such, the left common femoral vein was selected venous access. The left groin was prepped and draped in the usual sterile fashion, and a sterile drape was applied covering the operative field. Maximum barrier sterile technique with sterile gowns and gloves were used for the procedure. A timeout was performed prior to the initiation of the procedure. Local anesthesia was provided with 1% lidocaine. Under direct ultrasound guidance, the right common femoral vein was accessed with a micro puncture kit ultimately allowing placement of a 6 French, 35 cm vascular sheath. Slightly cranial to this initial access, the right common femoral was again accessed with an additional micropuncture  kit ultimately allowing placement of an additional 7 French, 35 cm vascular sheath. Ultrasound images were saved for procedural documentation purposes. With the use of a stiff glidewire, a vertebral catheter was advanced into the main pulmonary artery and a limited central pulmonary arteriogram was performed. Pressure measurements were then obtained from the main pulmonary artery. The vertebral catheter was advanced into the distal branch of the left lower lobe pulmonary artery. Limited contrast injection confirmed appropriate positioning. Over an exchange length Rosen wire, the vertebral catheter was exchanged for a 90/10 cm multi side-hole infusion catheter. Again, with the use of a stiff glidewire, a vertebral catheter was advanced into a distal branch of the right lower lobe pulmonary artery. Limited contrast injection confirmed appropriate positioning. Over an exchange length Rosen wire, the pigtail catheter was exchanged for a 90/15 cm multi side-hole infusion catheter. A postprocedural fluoroscopic image was obtained to document final catheter positioning. Both vascular sheath were secured at the left groin. The external catheter tubing was secured at the right thigh and the lytic therapy was initiated. The patient tolerated the procedure well without immediate postprocedural complication. FINDINGS: Central pulmonary arteriogram demonstrates significant near occlusive thrombus involving the peripheral aspects of both the right and left main pulmonary arteries with marked dilatation of the central pulmonary arterial system. Additionally, limited right main pulmonary arteriogram demonstrates near occlusive thrombus within the peripheral aspect of the right main pulmonary artery. Acquired pressure  measurements: Main  pulmonary artery-61/16; mean-40 (normal: < 25/10) Following the procedure, both ultrasound assisted infusion catheter tips terminate within the distal aspects of the bilateral lower lobe sub  segmental pulmonary arteries. IMPRESSION: 1. Successful fluoroscopic guided initiation of bilateral ultrasound assisted catheter directed pulmonary arterial lysis for sub massive pulmonary embolism and right-sided heart strain. 2. Elevated pressure measurements within the main pulmonary artery compatible with critical pulmonary arterial hypertension. 3. Near occlusive DVT noted within the right common femoral vein. PLAN: Above findings discussed with Dr. Hart Robinsons (critical care) at the time of procedure completion and the decision made to proceed with IVC filter placement for temporary caval interruption purposes at the time of post lysis pressure measurement acquisition. Electronically Signed   By: Sandi Mariscal M.D.   On: 08/31/2019 08:24   IR US Guide Vasc Access Left  Result Date: 08/31/2019 INDICATION: Sub massive pulmonary embolism. Request made for initiation of bilateral catheter directed pulmonary arterial thrombolysis. EXAM: 1. ULTRASOUND GUIDANCE FOR VENOUS ACCESS X2 2. PULMONARY ARTERIOGRAPHY 3. FLUOROSCOPIC GUIDED PLACEMENT OF BILATERAL PULMONARY ARTERIAL LYTIC INFUSION CATHETERS COMPARISON:  Chest CTA-earlier same day MEDICATIONS: None ANESTHESIA/SEDATION: Moderate (conscious) sedation was employed during this procedure. A total of Versed 2.5 mg and Fentanyl 100 mcg was administered intravenously. Moderate Sedation Time: 33 minutes. The patient's level of consciousness and vital signs were monitored continuously by radiology nursing throughout the procedure under my direct supervision. CONTRAST:  53m OMNIPAQUE IOHEXOL 300 MG/ML  SOLN FLUOROSCOPY TIME:  9 minutes, 18 seconds (1177mGy) COMPLICATIONS: None immediate. TECHNIQUE: Informed written consent was obtained from the patient after a discussion of the risks, benefits and alternatives to treatment. Questions regarding the procedure were encouraged and answered. A timeout was performed prior to the initiation of the procedure. Ultrasound scanning  was performed of the right groin and demonstrated near occlusive thrombus within the right common femoral vein. Sonographic evaluation the left groin demonstrates wide patency of the left common femoral vein. As such, the left common femoral vein was selected venous access. The left groin was prepped and draped in the usual sterile fashion, and a sterile drape was applied covering the operative field. Maximum barrier sterile technique with sterile gowns and gloves were used for the procedure. A timeout was performed prior to the initiation of the procedure. Local anesthesia was provided with 1% lidocaine. Under direct ultrasound guidance, the right common femoral vein was accessed with a micro puncture kit ultimately allowing placement of a 6 French, 35 cm vascular sheath. Slightly cranial to this initial access, the right common femoral was again accessed with an additional micropuncture kit ultimately allowing placement of an additional 7 French, 35 cm vascular sheath. Ultrasound images were saved for procedural documentation purposes. With the use of a stiff glidewire, a vertebral catheter was advanced into the main pulmonary artery and a limited central pulmonary arteriogram was performed. Pressure measurements were then obtained from the main pulmonary artery. The vertebral catheter was advanced into the distal branch of the left lower lobe pulmonary artery. Limited contrast injection confirmed appropriate positioning. Over an exchange length Rosen wire, the vertebral catheter was exchanged for a 90/10 cm multi side-hole infusion catheter. Again, with the use of a stiff glidewire, a vertebral catheter was advanced into a distal branch of the right lower lobe pulmonary artery. Limited contrast injection confirmed appropriate positioning. Over an exchange length Rosen wire, the pigtail catheter was exchanged for a 90/15 cm multi side-hole infusion catheter. A postprocedural fluoroscopic image was obtained to  document  final catheter positioning. Both vascular sheath were secured at the left groin. The external catheter tubing was secured at the right thigh and the lytic therapy was initiated. The patient tolerated the procedure well without immediate postprocedural complication. FINDINGS: Central pulmonary arteriogram demonstrates significant near occlusive thrombus involving the peripheral aspects of both the right and left main pulmonary arteries with marked dilatation of the central pulmonary arterial system. Additionally, limited right main pulmonary arteriogram demonstrates near occlusive thrombus within the peripheral aspect of the right main pulmonary artery. Acquired pressure measurements: Main  pulmonary artery-61/16; mean-40 (normal: < 25/10) Following the procedure, both ultrasound assisted infusion catheter tips terminate within the distal aspects of the bilateral lower lobe sub segmental pulmonary arteries. IMPRESSION: 1. Successful fluoroscopic guided initiation of bilateral ultrasound assisted catheter directed pulmonary arterial lysis for sub massive pulmonary embolism and right-sided heart strain. 2. Elevated pressure measurements within the main pulmonary artery compatible with critical pulmonary arterial hypertension. 3. Near occlusive DVT noted within the right common femoral vein. PLAN: Above findings discussed with Dr. Hart Robinsons (critical care) at the time of procedure completion and the decision made to proceed with IVC filter placement for temporary caval interruption purposes at the time of post lysis pressure measurement acquisition. Electronically Signed   By: Sandi Mariscal M.D.   On: 08/31/2019 08:24   ECHOCARDIOGRAM COMPLETE  Result Date: 08/30/2019    ECHOCARDIOGRAM REPORT   Patient Name:   JERMARCUS MCFADYEN Date of Exam: 08/30/2019 Medical Rec #:  [id number]     Height:       75.0 in Accession #:    [id number]    Weight:       172.0 lb Date of Birth:  1951/02/06     BSA:          2.06 m Patient  Age:    38 years      BP:           122/87 mmHg Patient Gender: M             HR:           98 bpm. Exam Location:  Inpatient Procedure: 2D Echo, Cardiac Doppler and Color Doppler STAT ECHO Indications:    R55 Syncope  History:        Patient has no prior history of Echocardiogram examinations.                 Aortic Rood Dilatation.  Sonographer:    Jonelle Sidle Dance Referring Phys: [id number] [place]  1. Severe RA/RV dilation with reduced RV function and severe pulmonary hypertension. LV hyperdynamic and borderline small cavity/underfilled. No hemodynamically significant valve disease.  2. Left ventricular ejection fraction, by estimation, is 65 to 70%. The left ventricle has hyperdynamic function. The left ventricle has no regional wall motion abnormalities. There is moderate concentric left ventricular hypertrophy. Left ventricular diastolic function could not be evaluated. There is the interventricular septum is flattened in systole and diastole, consistent with right ventricular pressure and volume overload.  3. Right ventricular systolic function is moderately reduced. The right ventricular size is severely enlarged. There is severely elevated pulmonary artery systolic pressure. The estimated right ventricular systolic pressure is 62.2 mmHg.  4. Right atrial size was severely dilated.  5. The mitral valve is normal in structure and function. Trivial mitral valve regurgitation. No evidence of mitral stenosis.  6. Tricuspid valve regurgitation is moderate.  7. The aortic valve is tricuspid. Aortic valve regurgitation is trivial. No aortic  stenosis is present.  8. Aortic dilatation noted. There is mild dilatation of the ascending aorta measuring 37 mm.  9. Mildly dilated pulmonary artery. 10. The inferior vena cava is dilated in size with <50% respiratory variability, suggesting right atrial pressure of 15 mmHg. 11. Cannot exclude IAS shunt. Comparison(s): No prior Echocardiogram. FINDINGS   Left Ventricle: Left ventricular ejection fraction, by estimation, is 65 to 70%. The left ventricle has hyperdynamic function. The left ventricle has no regional wall motion abnormalities. The left ventricular internal cavity size was small. There is moderate concentric left ventricular hypertrophy. The interventricular septum is flattened in systole and diastole, consistent with right ventricular pressure and volume overload. Left ventricular diastolic function could not be evaluated. Right Ventricle: The right ventricular size is severely enlarged. Right vetricular wall thickness was not assessed. Right ventricular systolic function is moderately reduced. There is severely elevated pulmonary artery systolic pressure. The tricuspid regurgitant velocity is 3.91 m/s, and with an assumed right atrial pressure of 15 mmHg, the estimated right ventricular systolic pressure is 11.0 mmHg. Left Atrium: Left atrial size was normal in size. Right Atrium: Right atrial size was severely dilated. Pericardium: There is no evidence of pericardial effusion. Mitral Valve: The mitral valve is normal in structure and function. Trivial mitral valve regurgitation. No evidence of mitral valve stenosis. Tricuspid Valve: The tricuspid valve is normal in structure. Tricuspid valve regurgitation is moderate . No evidence of tricuspid stenosis. Aortic Valve: The aortic valve is tricuspid. Aortic valve regurgitation is trivial. No aortic stenosis is present. Pulmonic Valve: The pulmonic valve was grossly normal. Pulmonic valve regurgitation is mild. No evidence of pulmonic stenosis. Aorta: Aortic dilatation noted. There is mild dilatation of the ascending aorta measuring 37 mm. Pulmonary Artery: The pulmonary artery is mildly dilated. Venous: The inferior vena cava is dilated in size with less than 50% respiratory variability, suggesting right atrial pressure of 15 mmHg. IAS/Shunts: Cannot exclude IAS shunt. Additional Comments: There is a  small pleural effusion in both left and right lateral regions.  LEFT VENTRICLE PLAX 2D LVIDd:         3.33 cm LVIDs:         2.20 cm LV PW:         1.75 cm LV IVS:        2.02 cm LVOT diam:     2.40 cm LV SV:         72.61 ml LV SV Index:   14.28 LVOT Area:     4.52 cm  RIGHT VENTRICLE          IVC RV Basal diam:  4.20 cm  IVC diam: 2.20 cm RV Mid diam:    3.00 cm TAPSE (M-mode): 1.4 cm LEFT ATRIUM             Index       RIGHT ATRIUM           Index LA diam:        3.40 cm 1.65 cm/m  RA Area:     39.50 cm LA Vol (A2C):   42.1 ml 20.46 ml/m RA Volume:   182.50 ml 88.67 ml/m LA Vol (A4C):   43.0 ml 20.89 ml/m LA Biplane Vol: 42.4 ml 20.60 ml/m  AORTIC VALVE LVOT Vmax:   102.40 cm/s LVOT Vmean:  57.500 cm/s LVOT VTI:    0.160 m  AORTA Ao Root diam: 4.20 cm Ao Asc diam:  3.70 cm MV A velocity: 78.45 cm/s  TRICUSPID VALVE  TR Peak grad:   61.2 mmHg                            TR Vmax:        391.00 cm/s                             SHUNTS                            Systemic VTI:  0.16 m                            Systemic Diam: 2.40 cm Buford Dresser MD Electronically signed by Buford Dresser MD Signature Date/Time: 08/30/2019/12:48:37 PM    Final    VAS [place] LOWER EXTREMITY VENOUS (DVT)  Result Date: 08/30/2019  Lower Venous DVTStudy Indications: Pulmonary embolism, and SOB.  Comparison Study: No prior exam. Performing Technologist: Baldwin Crown ARDMS, RVT  Examination Guidelines: A complete evaluation includes B-mode imaging, spectral Doppler, color Doppler, and power Doppler as needed of all accessible portions of each vessel. Bilateral testing is considered an integral part of a complete examination. Limited examinations for reoccurring indications may be performed as noted. The reflux portion of the exam is performed with the patient in reverse Trendelenburg.  +---------+---------------+---------+-----------+----------+---------------+ RIGHT     CompressibilityPhasicitySpontaneityPropertiesThrombus Aging  +---------+---------------+---------+-----------+----------+---------------+ CFV      Partial        Yes      Yes                                  +---------+---------------+---------+-----------+----------+---------------+ SFJ      None                                                         +---------+---------------+---------+-----------+----------+---------------+ FV Prox  Full                                                         +---------+---------------+---------+-----------+----------+---------------+ FV Mid   Full                                                         +---------+---------------+---------+-----------+----------+---------------+ FV DistalFull                                                         +---------+---------------+---------+-----------+----------+---------------+ PFV      Full                                                         +---------+---------------+---------+-----------+----------+---------------+  POP      Full           Yes      Yes                                  +---------+---------------+---------+-----------+----------+---------------+ PTV      Full                                         seen with color +---------+---------------+---------+-----------+----------+---------------+ PERO     Full                                         seen with color +---------+---------------+---------+-----------+----------+---------------+ EIV      Full           Yes      Yes                                  +---------+---------------+---------+-----------+----------+---------------+   +---------+---------------+---------+-----------+----------+---------------+ LEFT     CompressibilityPhasicitySpontaneityPropertiesThrombus Aging  +---------+---------------+---------+-----------+----------+---------------+ CFV      None           No        No                                   +---------+---------------+---------+-----------+----------+---------------+ SFJ      Full                                                         +---------+---------------+---------+-----------+----------+---------------+ FV Prox  Full                                                         +---------+---------------+---------+-----------+----------+---------------+ FV Mid   Full                                                         +---------+---------------+---------+-----------+----------+---------------+ FV DistalNone                                                         +---------+---------------+---------+-----------+----------+---------------+ PFV      Full                                                         +---------+---------------+---------+-----------+----------+---------------+  POP      None           No       No                                   +---------+---------------+---------+-----------+----------+---------------+ PTV      Full                                         seen with color +---------+---------------+---------+-----------+----------+---------------+ PERO     Full                                         seen with color +---------+---------------+---------+-----------+----------+---------------+ EIV      Full                                                         +---------+---------------+---------+-----------+----------+---------------+ Poorly visusalized bilteral calf veins, visualized with color flow.    Summary: RIGHT: - Findings consistent with acute deep vein thrombosis involving the SF junction, and right common femoral vein. - Findings consistent with acute superficial vein thrombosis involving the right great saphenous vein.  LEFT: - Findings consistent with acute deep vein thrombosis involving the left common femoral vein, left femoral vein, and left popliteal  vein.  *See table(s) above for measurements and observations. Electronically signed by Harold Barban MD on 08/30/2019 at 6:38:23 PM.    Final    IR INFUSION THROMBOL ARTERIAL INITIAL (MS)  Result Date: 08/31/2019 INDICATION: Sub massive pulmonary embolism. Request made for initiation of bilateral catheter directed pulmonary arterial thrombolysis. EXAM: 1. ULTRASOUND GUIDANCE FOR VENOUS ACCESS X2 2. PULMONARY ARTERIOGRAPHY 3. FLUOROSCOPIC GUIDED PLACEMENT OF BILATERAL PULMONARY ARTERIAL LYTIC INFUSION CATHETERS COMPARISON:  Chest CTA-earlier same day MEDICATIONS: None ANESTHESIA/SEDATION: Moderate (conscious) sedation was employed during this procedure. A total of Versed 2.5 mg and Fentanyl 100 mcg was administered intravenously. Moderate Sedation Time: 33 minutes. The patient's level of consciousness and vital signs were monitored continuously by radiology nursing throughout the procedure under my direct supervision. CONTRAST:  49m OMNIPAQUE IOHEXOL 300 MG/ML  SOLN FLUOROSCOPY TIME:  9 minutes, 18 seconds (1841mGy) COMPLICATIONS: None immediate. TECHNIQUE: Informed written consent was obtained from the patient after a discussion of the risks, benefits and alternatives to treatment. Questions regarding the procedure were encouraged and answered. A timeout was performed prior to the initiation of the procedure. Ultrasound scanning was performed of the right groin and demonstrated near occlusive thrombus within the right common femoral vein. Sonographic evaluation the left groin demonstrates wide patency of the left common femoral vein. As such, the left common femoral vein was selected venous access. The left groin was prepped and draped in the usual sterile fashion, and a sterile drape was applied covering the operative field. Maximum barrier sterile technique with sterile gowns and gloves were used for the procedure. A timeout was performed prior to the initiation of the procedure. Local anesthesia was provided  with 1% lidocaine. Under direct ultrasound guidance, the right common femoral vein was accessed with a micro puncture kit ultimately  allowing placement of a 6 French, 35 cm vascular sheath. Slightly cranial to this initial access, the right common femoral was again accessed with an additional micropuncture kit ultimately allowing placement of an additional 7 French, 35 cm vascular sheath. Ultrasound images were saved for procedural documentation purposes. With the use of a stiff glidewire, a vertebral catheter was advanced into the main pulmonary artery and a limited central pulmonary arteriogram was performed. Pressure measurements were then obtained from the main pulmonary artery. The vertebral catheter was advanced into the distal branch of the left lower lobe pulmonary artery. Limited contrast injection confirmed appropriate positioning. Over an exchange length Rosen wire, the vertebral catheter was exchanged for a 90/10 cm multi side-hole infusion catheter. Again, with the use of a stiff glidewire, a vertebral catheter was advanced into a distal branch of the right lower lobe pulmonary artery. Limited contrast injection confirmed appropriate positioning. Over an exchange length Rosen wire, the pigtail catheter was exchanged for a 90/15 cm multi side-hole infusion catheter. A postprocedural fluoroscopic image was obtained to document final catheter positioning. Both vascular sheath were secured at the left groin. The external catheter tubing was secured at the right thigh and the lytic therapy was initiated. The patient tolerated the procedure well without immediate postprocedural complication. FINDINGS: Central pulmonary arteriogram demonstrates significant near occlusive thrombus involving the peripheral aspects of both the right and left main pulmonary arteries with marked dilatation of the central pulmonary arterial system. Additionally, limited right main pulmonary arteriogram demonstrates near occlusive  thrombus within the peripheral aspect of the right main pulmonary artery. Acquired pressure measurements: Main  pulmonary artery-61/16; mean-40 (normal: < 25/10) Following the procedure, both ultrasound assisted infusion catheter tips terminate within the distal aspects of the bilateral lower lobe sub segmental pulmonary arteries. IMPRESSION: 1. Successful fluoroscopic guided initiation of bilateral ultrasound assisted catheter directed pulmonary arterial lysis for sub massive pulmonary embolism and right-sided heart strain. 2. Elevated pressure measurements within the main pulmonary artery compatible with critical pulmonary arterial hypertension. 3. Near occlusive DVT noted within the right common femoral vein. PLAN: Above findings discussed with Dr. Hart Robinsons (critical care) at the time of procedure completion and the decision made to proceed with IVC filter placement for temporary caval interruption purposes at the time of post lysis pressure measurement acquisition. Electronically Signed   By: Sandi Mariscal M.D.   On: 08/31/2019 08:24   IR INFUSION THROMBOL ARTERIAL INITIAL (MS)  Result Date: 08/31/2019 INDICATION: Sub massive pulmonary embolism. Request made for initiation of bilateral catheter directed pulmonary arterial thrombolysis. EXAM: 1. ULTRASOUND GUIDANCE FOR VENOUS ACCESS X2 2. PULMONARY ARTERIOGRAPHY 3. FLUOROSCOPIC GUIDED PLACEMENT OF BILATERAL PULMONARY ARTERIAL LYTIC INFUSION CATHETERS COMPARISON:  Chest CTA-earlier same day MEDICATIONS: None ANESTHESIA/SEDATION: Moderate (conscious) sedation was employed during this procedure. A total of Versed 2.5 mg and Fentanyl 100 mcg was administered intravenously. Moderate Sedation Time: 33 minutes. The patient's level of consciousness and vital signs were monitored continuously by radiology nursing throughout the procedure under my direct supervision. CONTRAST:  98m OMNIPAQUE IOHEXOL 300 MG/ML  SOLN FLUOROSCOPY TIME:  9 minutes, 18 seconds (1009mGy)  COMPLICATIONS: None immediate. TECHNIQUE: Informed written consent was obtained from the patient after a discussion of the risks, benefits and alternatives to treatment. Questions regarding the procedure were encouraged and answered. A timeout was performed prior to the initiation of the procedure. Ultrasound scanning was performed of the right groin and demonstrated near occlusive thrombus within the right common femoral vein. Sonographic evaluation the left groin demonstrates  wide patency of the left common femoral vein. As such, the left common femoral vein was selected venous access. The left groin was prepped and draped in the usual sterile fashion, and a sterile drape was applied covering the operative field. Maximum barrier sterile technique with sterile gowns and gloves were used for the procedure. A timeout was performed prior to the initiation of the procedure. Local anesthesia was provided with 1% lidocaine. Under direct ultrasound guidance, the right common femoral vein was accessed with a micro puncture kit ultimately allowing placement of a 6 French, 35 cm vascular sheath. Slightly cranial to this initial access, the right common femoral was again accessed with an additional micropuncture kit ultimately allowing placement of an additional 7 French, 35 cm vascular sheath. Ultrasound images were saved for procedural documentation purposes. With the use of a stiff glidewire, a vertebral catheter was advanced into the main pulmonary artery and a limited central pulmonary arteriogram was performed. Pressure measurements were then obtained from the main pulmonary artery. The vertebral catheter was advanced into the distal branch of the left lower lobe pulmonary artery. Limited contrast injection confirmed appropriate positioning. Over an exchange length Rosen wire, the vertebral catheter was exchanged for a 90/10 cm multi side-hole infusion catheter. Again, with the use of a stiff glidewire, a vertebral  catheter was advanced into a distal branch of the right lower lobe pulmonary artery. Limited contrast injection confirmed appropriate positioning. Over an exchange length Rosen wire, the pigtail catheter was exchanged for a 90/15 cm multi side-hole infusion catheter. A postprocedural fluoroscopic image was obtained to document final catheter positioning. Both vascular sheath were secured at the left groin. The external catheter tubing was secured at the right thigh and the lytic therapy was initiated. The patient tolerated the procedure well without immediate postprocedural complication. FINDINGS: Central pulmonary arteriogram demonstrates significant near occlusive thrombus involving the peripheral aspects of both the right and left main pulmonary arteries with marked dilatation of the central pulmonary arterial system. Additionally, limited right main pulmonary arteriogram demonstrates near occlusive thrombus within the peripheral aspect of the right main pulmonary artery. Acquired pressure measurements: Main  pulmonary artery-61/16; mean-40 (normal: < 25/10) Following the procedure, both ultrasound assisted infusion catheter tips terminate within the distal aspects of the bilateral lower lobe sub segmental pulmonary arteries. IMPRESSION: 1. Successful fluoroscopic guided initiation of bilateral ultrasound assisted catheter directed pulmonary arterial lysis for sub massive pulmonary embolism and right-sided heart strain. 2. Elevated pressure measurements within the main pulmonary artery compatible with critical pulmonary arterial hypertension. 3. Near occlusive DVT noted within the right common femoral vein. PLAN: Above findings discussed with Dr. Hart Robinsons (critical care) at the time of procedure completion and the decision made to proceed with IVC filter placement for temporary caval interruption purposes at the time of post lysis pressure measurement acquisition. Electronically Signed   By: Sandi Mariscal M.D.   On:  08/31/2019 08:24   IR THROMB F/U EVAL ART/VEN FINAL DAY (MS)  Result Date: 08/31/2019 CLINICAL DATA:  Bilateral submassive pulmonary embolism with cor pulmonale. Status post thrombo lytic infusion via each pulmonary artery for 12 hours. Request has also been made to place an IVC filter on completion thrombo lytic therapy given residual DVT in both lower extremities. EXAM: 1. FINAL DAY ARTERIAL THROMBOLYTIC THERAPY PRESSURE MEASUREMENT. 2. IVC VENOGRAM. 3. PERCUTANEOUS IVC FILTER PLACEMENT. ANESTHESIA/SEDATION: 50 mcg IV Fentanyl. The patient was just given fentanyl and not formally sedated. CONTRAST:  27m OMNIPAQUE IOHEXOL 300 MG/ML SOLN, 137mOMNIPAQUE IOHEXOL  300 MG/ML SOLN, 72m OMNIPAQUE IOHEXOL 300 MG/ML SOLN FLUOROSCOPY TIME:  1 minutes and 30 seconds.  146.6 mGy. PROCEDURE: The procedure, risks, benefits, and alternatives were explained to the patient. Questions regarding the procedure were encouraged and answered. The patient understands and consents to the procedure. A time-out was performed prior to initiating the procedure. Pulmonary artery pressures were directly measured through the pulmonary arterial infusion catheters. The catheters were then removed. Left femoral vein sheaths placed yesterday were prepped and draped. Local anesthesia was provided with 1% Lidocaine. A guidewire was advanced into the inferior vena cava via one of the sheaths. This sheath was removed and the venotomy dilated. A 10 French deployment sheath was advanced over the guidewire. This was utilized to perform IVC venography. The deployment sheath was further positioned in an appropriate location for filter deployment. A Bard Denali IVC filter was then advanced in the sheath. This was then fully deployed in the infrarenal IVC. Final filter position was confirmed with a fluoroscopic spot image. After the procedure the sheath was removed and hemostasis obtained with manual compression. COMPLICATIONS: None. FINDINGS: Measure  pulmonary artery pressure is 55/20, mean 28 mm Hg compared to 61/16, 40 mm Hg prior to lytic therapy. IVC venography demonstrates a normal caliber IVC with no evidence of thrombus. Renal veins are identified bilaterally. The IVC filter was successfully positioned below the level of the renal veins and is appropriately oriented. This IVC filter has both permanent and retrievable indications. IMPRESSION: 1. Decrease in measured pulmonary artery pressure after 12 hours of bilateral pulmonary arterial thrombolytic therapy. 2. Placement of percutaneous IVC filter in infrarenal IVC. IVC venogram shows no evidence of IVC thrombus and normal caliber of the inferior vena cava. This filter does have both permanent and retrievable indications. PLAN: This IVC filter is potentially retrievable. The patient will be assessed for filter retrieval by Interventional Radiology in approximately 8-12 weeks. Further recommendations regarding filter retrieval, continued surveillance or declaration of device permanence, will be made at that time. Electronically Signed   By: GAletta EdouardM.D.   On: 08/31/2019 14:25        Scheduled Meds: . Chlorhexidine Gluconate Cloth  6 each Topical Daily  . dorzolamide  1 drop Both Eyes BID  . Latanoprostene Bunod  1 drop Both Eyes Daily  . mouth rinse  15 mL Mouth Rinse BID  . metoprolol tartrate  50 mg Oral BID  . sodium chloride flush  3 mL Intravenous Q12H  . tamsulosin  0.4 mg Oral QHS  . timolol  1 drop Both Eyes BID   Continuous Infusions: . sodium chloride 125 mL/hr at 09/01/19 0900  . sodium chloride    . sodium chloride Stopped (08/31/19 1032)  . sodium chloride Stopped (08/31/19 1034)  . heparin 1,500 Units/hr (09/01/19 0900)     LOS: 2 days    Time spent: 35 minutes. Greater than 50% of this time was spent in direct contact with the patient, coordinating care and discussing relevant ongoing clinical issues, including plan to continue IV anticoagulation for  another 24 hours, plan to transfer to floor to commence disposition planning including PT and OT consultations..Lelon Frohlich MD [facility] Pager [phone] If 7PM-7AM, please contact night-coverage [email or web address] Password [email or web address]2/19/2021, 9:23 AM

## 2019-09-01 NOTE — Progress Notes (Signed)
ANTICOAGULATION CONSULT NOTE Pharmacy Consult for IV Heparin Indication: pulmonary embolus  No Known Allergies  Patient Measurements: Height: 6\' 3"  (190.5 cm) Weight: 172 lb (78 kg) IBW/kg (Calculated) : 84.5 Heparin Dosing Weight: actual  Vital Signs: Temp: 97.6 F (36.4 C) (02/19 0806) Temp Source: Oral (02/19 0806) BP: 150/89 (02/19 1100) Pulse Rate: 72 (02/19 1100)  Labs: Recent Labs     0000 08/30/19 0445 08/30/19 0445 08/30/19 0453 08/30/19 0656 08/30/19 1338 08/30/19 2248 08/31/19 0019 08/31/19 0430 08/31/19 1418 09/01/19 0451  HGB   < > 12.8*   < > 12.9*  --   --    < > 12.2* 11.0*  --  10.8*  HCT   < > 38.3*   < > 38.0*  --   --   --  36.6* 32.8*  --  32.3*  PLT   < > 296  --   --   --   --   --  254 263  --  208  APTT  --   --   --   --  28  --   --   --   --   --   --   LABPROT  --   --   --   --  15.4*  --   --   --   --   --   --   INR  --   --   --   --  1.2  --   --   --   --   --   --   HEPARINUNFRC  --   --   --   --   --    < >   < >  --  0.55 0.48 0.50  CREATININE  --  0.83  --  0.80  --   --   --   --  0.84  --   --   TROPONINIHS  --  59*  --   --  62*  --   --   --   --   --   --    < > = values in this interval not displayed.    Estimated Creatinine Clearance: 95.4 mL/min (by C-G formula based on SCr of 0.84 mg/dL).  Assessment: 44 yoM c/o loss of consciousness found to have massive PE with complete occlusion of the right main pulmonary artery and absent pulmonary arterial blood flow to right lung. Patient underwent catheter directed thrombolysis on 2/17 around 17:00. Pharmacy to dose IV heparin for PE  Baselines: Hg 12.9, PLTC 296, INR 1.2, aPTT 28   2/17  Confirmatory heparin level therapeutic at 0.47 units/hr  Catheter directed thrombolysis completed at 1700 without complication  Post procedural CBC pending  2248 HL = 0.50 at goal, Patient has some bleeding from femoral venipuncture sites- MD ordered thrombi pads to site  Per  updated protocol, heparin is no longer paused for sheath removal   Today, 09/01/2019 No bleed noted  0500 Hep level = 0.5 units/ml, remains in therapeutic range  Goal of Therapy:  Heparin level 0.3-0.7 units/ml Monitor platelets by anticoagulation protocol: Yes   Plan:   Continue heparin drip at 1500 units/hr  CBC & Heparin levels daily  F/u for transition to long-term anticoagulant  Monitor for s/s bleeding or worsening thrombosis  09/03/2019 PharmD [phone] 09/01/2019, 11:38 AM

## 2019-09-02 LAB — CBC
HCT: 30.9 % — ABNORMAL LOW (ref 39.0–52.0)
Hemoglobin: 10.7 g/dL — ABNORMAL LOW (ref 13.0–17.0)
MCH: 31.8 pg (ref 26.0–34.0)
MCHC: 34.6 g/dL (ref 30.0–36.0)
MCV: 92 fL (ref 80.0–100.0)
Platelets: 252 10*3/uL (ref 150–400)
RBC: 3.36 MIL/uL — ABNORMAL LOW (ref 4.22–5.81)
RDW: 13 % (ref 11.5–15.5)
WBC: 6.7 10*3/uL (ref 4.0–10.5)
nRBC: 0 % (ref 0.0–0.2)

## 2019-09-02 LAB — HEPARIN LEVEL (UNFRACTIONATED): Heparin Unfractionated: 0.3 IU/mL (ref 0.30–0.70)

## 2019-09-02 LAB — LUPUS ANTICOAGULANT PANEL
DRVVT: 43.2 s (ref 0.0–47.0)
PTT Lupus Anticoagulant: 43.8 s (ref 0.0–51.9)

## 2019-09-02 LAB — CARDIOLIPIN ANTIBODIES, IGG, IGM, IGA
Anticardiolipin IgA: <9 APL U/mL (ref 0–11)
Anticardiolipin IgG: <9 GPL U/mL (ref 0–14)
Anticardiolipin IgM: <9 MPL U/mL (ref 0–12)

## 2019-09-02 LAB — BETA-2-GLYCOPROTEIN I ABS, IGG/M/A
Beta-2 Glyco I IgG: <9 GPI IgG units (ref 0–20)
Beta-2-Glycoprotein I IgA: <9 GPI IgA units (ref 0–25)
Beta-2-Glycoprotein I IgM: <9 GPI IgM units (ref 0–32)

## 2019-09-02 LAB — PROTEIN C ACTIVITY: Protein C Activity: 99 % (ref 73–180)

## 2019-09-02 LAB — PROTEIN S, TOTAL: Protein S Ag, Total: 74 % (ref 60–150)

## 2019-09-02 LAB — PROTEIN S ACTIVITY: Protein S Activity: 49 % — ABNORMAL LOW (ref 63–140)

## 2019-09-02 MED ORDER — RIVAROXABAN 15 MG PO TABS
15.0000 mg | ORAL_TABLET | Freq: Two times a day (BID) | ORAL | Status: DC
  Administered 2019-09-02 (×2): 15 mg via ORAL
  Filled 2019-09-02 (×2): qty 1

## 2019-09-02 MED ORDER — RIVAROXABAN (XARELTO) VTE STARTER PACK (15 & 20 MG): ORAL_TABLET | ORAL | 0 refills | Status: DC

## 2019-09-02 MED ORDER — POLYETHYLENE GLYCOL 3350 17 G PO PACK: 17.0000 g | PACK | Freq: Every day | ORAL | 0 refills | Status: DC

## 2019-09-02 MED ORDER — RIVAROXABAN 20 MG PO TABS: 20.0000 mg | ORAL_TABLET | Freq: Every day | ORAL | 0 refills | Status: DC

## 2019-09-02 MED ORDER — POLYETHYLENE GLYCOL 3350 17 G PO PACK
17.0000 g | PACK | Freq: Every day | ORAL | Status: DC
  Administered 2019-09-02: 17 g via ORAL
  Filled 2019-09-02: qty 1

## 2019-09-02 MED ORDER — RIVAROXABAN 20 MG PO TABS: 20.0000 mg | ORAL_TABLET | Freq: Every day | ORAL | Status: DC

## 2019-09-02 NOTE — Progress Notes (Signed)
Went over discharge paperwork with both patient and his sister.  All questions answered.  AVS and prescriptions given to patient.  VSS.  PT wheeled out.

## 2019-09-02 NOTE — Discharge Summary (Signed)
Physician Discharge Summary  Joe Cantu JSE:[id number] DOB: [birth year] DOA: 08/30/2019  PCP: Iona Beard, MD  Admit date: 08/30/2019 Discharge date: 09/02/2019  Time spent: 45 minutes  Recommendations for Outpatient Follow-up:  -To be discharged home today. -Advised to follow up with PCP in 2 weeks.   Discharge Diagnoses:  Principal Problem:   Acute massive pulmonary embolism ([place]) Active Problems:   Palpitations   Frequent unifocal PVCs   DVT (deep venous thrombosis) ([facility])   Syncope and collapse   Discharge Condition: Stable and improved  Filed Weights   08/30/19 0411  Weight: 78 kg    History of present illness:  As per Dr. Lonny Prude on 2/17: Joe Cantu is a 69 y.o. male with medical history significant of frequent PVCs and aortic root dilation. Patient reported passing out this morning around 2:00 AM after urinating. He reports feeling weak to the point of easing down to the ground. He reports no one witnessed his syncopal episode. He is unsure of how long he was down and he awoke to the paramedics arriving. He reports that his fiance found him. He has some dyspnea on exertion but is comfortable at rest. No chest pain. Patient reports his mother had a blood clot with associated stroke. No other risk factors noted. CTA of the chest was significant for a massive PE with complete occlusion of the right main artery with absent arterial blood flow to right lung in addition to partially occlusive filling defects of left lower lobe pulmonary arteries; also evidence of significant right heart strain with associated right heart dilation  Hospital Course:   Acute massive PE and left lower extremity deep vein thrombosis with significant right heart dysfunction -He received catheter-directed TPA. -Was on a heparin drip and has been transitioned to oral anticoagulation (Xarelto) which should be lifelong in the setting of a massive thromboembolic phenomenon that was unprovoked. -He  is currently hemodynamically stable. -IVC filter was placed on 2/18 due to concerns that additional clot burden could cause significant respiratory decompensation. It has been recommended that this be removed in about 3 months.  Syncope -Secondary to large acute PE and resultant hypoxia -No arrhythmias on telemetry other than frequent PVCs this admission.  Frequent PVCs -Continue metoprolol 50 mg twice daily.  Procedures:  Catheter directed TPA 2/17  IVC filter placement 2/18   Consultations:  Critical care medicine  Interventional Radiology  Discharge Instructions  Discharge Instructions    Increase activity slowly   Complete by: As directed      Allergies as of 09/02/2019   No Known Allergies     Medication List    STOP taking these medications   aspirin 81 MG tablet     TAKE these medications   dorzolamide 2 % ophthalmic solution Commonly known as: TRUSOPT Place 1 drop into both eyes 2 (two) times daily.   metoprolol tartrate 50 MG tablet Commonly known as: LOPRESSOR Take 1 tablet (50 mg total) by mouth 2 (two) times daily.   polyethylene glycol 17 g packet Commonly known as: MIRALAX / GLYCOLAX Take 17 g by mouth daily. Start taking on: September 03, 2019   Rivaroxaban 15 & 20 MG Tbpk Follow package directions: Take one '15mg'$  tablet by mouth twice a day. On day 22, switch to one '20mg'$  tablet once a day. Take with food.   rivaroxaban 20 MG Tabs tablet Commonly known as: Xarelto Take 1 tablet (20 mg total) by mouth daily with supper. Start after you are  done with the starter pack.   tamsulosin 0.4 MG Caps capsule Commonly known as: FLOMAX Take 0.4 mg by mouth at bedtime.   timolol 0.5 % ophthalmic solution Commonly known as: TIMOPTIC Place 1 drop into both eyes 2 (two) times daily.   Vyzulta 0.024 % Soln Generic drug: Latanoprostene Bunod Apply 1 drop to eye daily.      No Known Allergies Follow-up Information    Iona Beard, MD. Schedule an  appointment as soon as possible for a visit in 2 week(s).   Specialty: Family Medicine Contact information: [address] [place] [address] [phone]            The results of significant diagnostics from this hospitalization (including imaging, microbiology, ancillary and laboratory) are listed below for reference.    Significant Diagnostic Studies: CT Angio Chest PE W and/or Wo Contrast  Result Date: 08/30/2019 CLINICAL DATA:  Syncopal episode while using the bathroom. EXAM: CT ANGIOGRAPHY CHEST WITH CONTRAST TECHNIQUE: Multidetector CT imaging of the chest was performed using the standard protocol during bolus administration of intravenous contrast. Multiplanar CT image reconstructions and MIPs were obtained to evaluate the vascular anatomy. CONTRAST:  135m OMNIPAQUE IOHEXOL 350 MG/ML SOLN COMPARISON:  None. FINDINGS: Cardiovascular: Massive bilateral pulmonary arterial filling defects with near complete occlusion of the right pulmonary artery, no pulmonary arterial blood flow noted to the right lung. Large thrombus in the left lower lobe pulmonary artery which is partially occlusive. Marked right heart dilatation with contrast refluxing into the hepatic veins and IVC. Elevated RV to LV ratio of 2.16. Aortic atherosclerosis. Cannot assess for dissection given phase of contrast. Mild dilatation of the aortic root at 3.6 cm without aortic aneurysm. No pericardial effusion. Mediastinum/Nodes: No adenopathy. No esophageal wall thickening. No suspicious thyroid nodule. Lungs/Pleura: Triangular subpleural opacities in the right upper and lower lobes likely pulmonary infarct. Diminished pulmonary vessels in the right lung related to pulmonary embolus. Subpleural opacities in the left lower lobe favor atelectasis. No pulmonary mass. No pulmonary edema. Upper Abdomen: Contrast refluxes into the hepatic veins and IVC. No other acute findings. Musculoskeletal: There are no acute or suspicious  osseous abnormalities. Few bone islands in the right proximal humerus. Review of the MIP images confirms the above findings. IMPRESSION: 1. Massive pulmonary emboli with complete occlusion of the right main pulmonary artery and absent pulmonary arterial blood flow to the right lung. Large but partially occlusive filling defects in the left lower lobe pulmonary arteries. Significant right heart strain with RV to LV ratio of greater than 2, right heart dilatation and contrast refluxing into the hepatic veins and IVC. 2. Small pulmonary infarcts in the right upper and lower lobe. Critical Value/emergent results were called by telephone at the time of interpretation on 08/30/2019 at 6:24 am to Dr PAddison Lank, who verbally acknowledged these results. Electronically Signed   By: MKeith RakeM.D.   On: 08/30/2019 06:30   IR Angiogram Pulmonary Bilateral Selective  Result Date: 08/31/2019 INDICATION: Sub massive pulmonary embolism. Request made for initiation of bilateral catheter directed pulmonary arterial thrombolysis. EXAM: 1. ULTRASOUND GUIDANCE FOR VENOUS ACCESS X2 2. PULMONARY ARTERIOGRAPHY 3. FLUOROSCOPIC GUIDED PLACEMENT OF BILATERAL PULMONARY ARTERIAL LYTIC INFUSION CATHETERS COMPARISON:  Chest CTA-earlier same day MEDICATIONS: None ANESTHESIA/SEDATION: Moderate (conscious) sedation was employed during this procedure. A total of Versed 2.5 mg and Fentanyl 100 mcg was administered intravenously. Moderate Sedation Time: 33 minutes. The patient's level of consciousness and vital signs were monitored continuously by  radiology nursing throughout the procedure under my direct supervision. CONTRAST:  38m OMNIPAQUE IOHEXOL 300 MG/ML  SOLN FLUOROSCOPY TIME:  9 minutes, 18 seconds (1158mGy) COMPLICATIONS: None immediate. TECHNIQUE: Informed written consent was obtained from the patient after a discussion of the risks, benefits and alternatives to treatment. Questions regarding the procedure were encouraged and  answered. A timeout was performed prior to the initiation of the procedure. Ultrasound scanning was performed of the right groin and demonstrated near occlusive thrombus within the right common femoral vein. Sonographic evaluation the left groin demonstrates wide patency of the left common femoral vein. As such, the left common femoral vein was selected venous access. The left groin was prepped and draped in the usual sterile fashion, and a sterile drape was applied covering the operative field. Maximum barrier sterile technique with sterile gowns and gloves were used for the procedure. A timeout was performed prior to the initiation of the procedure. Local anesthesia was provided with 1% lidocaine. Under direct ultrasound guidance, the right common femoral vein was accessed with a micro puncture kit ultimately allowing placement of a 6 French, 35 cm vascular sheath. Slightly cranial to this initial access, the right common femoral was again accessed with an additional micropuncture kit ultimately allowing placement of an additional 7 French, 35 cm vascular sheath. Ultrasound images were saved for procedural documentation purposes. With the use of a stiff glidewire, a vertebral catheter was advanced into the main pulmonary artery and a limited central pulmonary arteriogram was performed. Pressure measurements were then obtained from the main pulmonary artery. The vertebral catheter was advanced into the distal branch of the left lower lobe pulmonary artery. Limited contrast injection confirmed appropriate positioning. Over an exchange length Rosen wire, the vertebral catheter was exchanged for a 90/10 cm multi side-hole infusion catheter. Again, with the use of a stiff glidewire, a vertebral catheter was advanced into a distal branch of the right lower lobe pulmonary artery. Limited contrast injection confirmed appropriate positioning. Over an exchange length Rosen wire, the pigtail catheter was exchanged for a  90/15 cm multi side-hole infusion catheter. A postprocedural fluoroscopic image was obtained to document final catheter positioning. Both vascular sheath were secured at the left groin. The external catheter tubing was secured at the right thigh and the lytic therapy was initiated. The patient tolerated the procedure well without immediate postprocedural complication. FINDINGS: Central pulmonary arteriogram demonstrates significant near occlusive thrombus involving the peripheral aspects of both the right and left main pulmonary arteries with marked dilatation of the central pulmonary arterial system. Additionally, limited right main pulmonary arteriogram demonstrates near occlusive thrombus within the peripheral aspect of the right main pulmonary artery. Acquired pressure measurements: Main  pulmonary artery-61/16; mean-40 (normal: < 25/10) Following the procedure, both ultrasound assisted infusion catheter tips terminate within the distal aspects of the bilateral lower lobe sub segmental pulmonary arteries. IMPRESSION: 1. Successful fluoroscopic guided initiation of bilateral ultrasound assisted catheter directed pulmonary arterial lysis for sub massive pulmonary embolism and right-sided heart strain. 2. Elevated pressure measurements within the main pulmonary artery compatible with critical pulmonary arterial hypertension. 3. Near occlusive DVT noted within the right common femoral vein. PLAN: Above findings discussed with Dr. PHart Robinsons(critical care) at the time of procedure completion and the decision made to proceed with IVC filter placement for temporary caval interruption purposes at the time of post lysis pressure measurement acquisition. Electronically Signed   By: JSandi MariscalM.D.   On: 08/31/2019 08:24   IR IVC FILTER PLMT /  S&I Burke Keels GUID/MOD SED  Result Date: 08/31/2019 CLINICAL DATA:  Bilateral submassive pulmonary embolism with cor pulmonale. Status post thrombo lytic infusion via each pulmonary  artery for 12 hours. Request has also been made to place an IVC filter on completion thrombo lytic therapy given residual DVT in both lower extremities. EXAM: 1. FINAL DAY ARTERIAL THROMBOLYTIC THERAPY PRESSURE MEASUREMENT. 2. IVC VENOGRAM. 3. PERCUTANEOUS IVC FILTER PLACEMENT. ANESTHESIA/SEDATION: 50 mcg IV Fentanyl. The patient was just given fentanyl and not formally sedated. CONTRAST:  23m OMNIPAQUE IOHEXOL 300 MG/ML SOLN, 118mOMNIPAQUE IOHEXOL 300 MG/ML SOLN, 1037mMNIPAQUE IOHEXOL 300 MG/ML SOLN FLUOROSCOPY TIME:  1 minutes and 30 seconds.  146.6 mGy. PROCEDURE: The procedure, risks, benefits, and alternatives were explained to the patient. Questions regarding the procedure were encouraged and answered. The patient understands and consents to the procedure. A time-out was performed prior to initiating the procedure. Pulmonary artery pressures were directly measured through the pulmonary arterial infusion catheters. The catheters were then removed. Left femoral vein sheaths placed yesterday were prepped and draped. Local anesthesia was provided with 1% Lidocaine. A guidewire was advanced into the inferior vena cava via one of the sheaths. This sheath was removed and the venotomy dilated. A 10 French deployment sheath was advanced over the guidewire. This was utilized to perform IVC venography. The deployment sheath was further positioned in an appropriate location for filter deployment. A Bard Denali IVC filter was then advanced in the sheath. This was then fully deployed in the infrarenal IVC. Final filter position was confirmed with a fluoroscopic spot image. After the procedure the sheath was removed and hemostasis obtained with manual compression. COMPLICATIONS: None. FINDINGS: Measure pulmonary artery pressure is 55/20, mean 28 mm Hg compared to 61/16, 40 mm Hg prior to lytic therapy. IVC venography demonstrates a normal caliber IVC with no evidence of thrombus. Renal veins are identified bilaterally.  The IVC filter was successfully positioned below the level of the renal veins and is appropriately oriented. This IVC filter has both permanent and retrievable indications. IMPRESSION: 1. Decrease in measured pulmonary artery pressure after 12 hours of bilateral pulmonary arterial thrombolytic therapy. 2. Placement of percutaneous IVC filter in infrarenal IVC. IVC venogram shows no evidence of IVC thrombus and normal caliber of the inferior vena cava. This filter does have both permanent and retrievable indications. PLAN: This IVC filter is potentially retrievable. The patient will be assessed for filter retrieval by Interventional Radiology in approximately 8-12 weeks. Further recommendations regarding filter retrieval, continued surveillance or declaration of device permanence, will be made at that time. Electronically Signed   By: GleAletta EdouardD.   On: 08/31/2019 14:25   IR [place] Vasc Access Left  Result Date: 08/31/2019 INDICATION: Sub massive pulmonary embolism. Request made for initiation of bilateral catheter directed pulmonary arterial thrombolysis. EXAM: 1. ULTRASOUND GUIDANCE FOR VENOUS ACCESS X2 2. PULMONARY ARTERIOGRAPHY 3. FLUOROSCOPIC GUIDED PLACEMENT OF BILATERAL PULMONARY ARTERIAL LYTIC INFUSION CATHETERS COMPARISON:  Chest CTA-earlier same day MEDICATIONS: None ANESTHESIA/SEDATION: Moderate (conscious) sedation was employed during this procedure. A total of Versed 2.5 mg and Fentanyl 100 mcg was administered intravenously. Moderate Sedation Time: 33 minutes. The patient's level of consciousness and vital signs were monitored continuously by radiology nursing throughout the procedure under my direct supervision. CONTRAST:  38m48mNIPAQUE IOHEXOL 300 MG/ML  SOLN FLUOROSCOPY TIME:  9 minutes, 18 seconds (141 254) COMPLICATIONS: None immediate. TECHNIQUE: Informed written consent was obtained from the patient after a discussion of the risks, benefits and alternatives to  treatment. Questions  regarding the procedure were encouraged and answered. A timeout was performed prior to the initiation of the procedure. Ultrasound scanning was performed of the right groin and demonstrated near occlusive thrombus within the right common femoral vein. Sonographic evaluation the left groin demonstrates wide patency of the left common femoral vein. As such, the left common femoral vein was selected venous access. The left groin was prepped and draped in the usual sterile fashion, and a sterile drape was applied covering the operative field. Maximum barrier sterile technique with sterile gowns and gloves were used for the procedure. A timeout was performed prior to the initiation of the procedure. Local anesthesia was provided with 1% lidocaine. Under direct ultrasound guidance, the right common femoral vein was accessed with a micro puncture kit ultimately allowing placement of a 6 French, 35 cm vascular sheath. Slightly cranial to this initial access, the right common femoral was again accessed with an additional micropuncture kit ultimately allowing placement of an additional 7 French, 35 cm vascular sheath. Ultrasound images were saved for procedural documentation purposes. With the use of a stiff glidewire, a vertebral catheter was advanced into the main pulmonary artery and a limited central pulmonary arteriogram was performed. Pressure measurements were then obtained from the main pulmonary artery. The vertebral catheter was advanced into the distal branch of the left lower lobe pulmonary artery. Limited contrast injection confirmed appropriate positioning. Over an exchange length Rosen wire, the vertebral catheter was exchanged for a 90/10 cm multi side-hole infusion catheter. Again, with the use of a stiff glidewire, a vertebral catheter was advanced into a distal branch of the right lower lobe pulmonary artery. Limited contrast injection confirmed appropriate positioning. Over an exchange length Rosen wire,  the pigtail catheter was exchanged for a 90/15 cm multi side-hole infusion catheter. A postprocedural fluoroscopic image was obtained to document final catheter positioning. Both vascular sheath were secured at the left groin. The external catheter tubing was secured at the right thigh and the lytic therapy was initiated. The patient tolerated the procedure well without immediate postprocedural complication. FINDINGS: Central pulmonary arteriogram demonstrates significant near occlusive thrombus involving the peripheral aspects of both the right and left main pulmonary arteries with marked dilatation of the central pulmonary arterial system. Additionally, limited right main pulmonary arteriogram demonstrates near occlusive thrombus within the peripheral aspect of the right main pulmonary artery. Acquired pressure measurements: Main  pulmonary artery-61/16; mean-40 (normal: < 25/10) Following the procedure, both ultrasound assisted infusion catheter tips terminate within the distal aspects of the bilateral lower lobe sub segmental pulmonary arteries. IMPRESSION: 1. Successful fluoroscopic guided initiation of bilateral ultrasound assisted catheter directed pulmonary arterial lysis for sub massive pulmonary embolism and right-sided heart strain. 2. Elevated pressure measurements within the main pulmonary artery compatible with critical pulmonary arterial hypertension. 3. Near occlusive DVT noted within the right common femoral vein. PLAN: Above findings discussed with Dr. Hart Robinsons (critical care) at the time of procedure completion and the decision made to proceed with IVC filter placement for temporary caval interruption purposes at the time of post lysis pressure measurement acquisition. Electronically Signed   By: Sandi Mariscal M.D.   On: 08/31/2019 08:24   IR US Guide Vasc Access Left  Result Date: 08/31/2019 INDICATION: Sub massive pulmonary embolism. Request made for initiation of bilateral catheter directed  pulmonary arterial thrombolysis. EXAM: 1. ULTRASOUND GUIDANCE FOR VENOUS ACCESS X2 2. PULMONARY ARTERIOGRAPHY 3. FLUOROSCOPIC GUIDED PLACEMENT OF BILATERAL PULMONARY ARTERIAL LYTIC INFUSION CATHETERS COMPARISON:  Chest CTA-earlier same  day MEDICATIONS: None ANESTHESIA/SEDATION: Moderate (conscious) sedation was employed during this procedure. A total of Versed 2.5 mg and Fentanyl 100 mcg was administered intravenously. Moderate Sedation Time: 33 minutes. The patient's level of consciousness and vital signs were monitored continuously by radiology nursing throughout the procedure under my direct supervision. CONTRAST:  50m OMNIPAQUE IOHEXOL 300 MG/ML  SOLN FLUOROSCOPY TIME:  9 minutes, 18 seconds (1465mGy) COMPLICATIONS: None immediate. TECHNIQUE: Informed written consent was obtained from the patient after a discussion of the risks, benefits and alternatives to treatment. Questions regarding the procedure were encouraged and answered. A timeout was performed prior to the initiation of the procedure. Ultrasound scanning was performed of the right groin and demonstrated near occlusive thrombus within the right common femoral vein. Sonographic evaluation the left groin demonstrates wide patency of the left common femoral vein. As such, the left common femoral vein was selected venous access. The left groin was prepped and draped in the usual sterile fashion, and a sterile drape was applied covering the operative field. Maximum barrier sterile technique with sterile gowns and gloves were used for the procedure. A timeout was performed prior to the initiation of the procedure. Local anesthesia was provided with 1% lidocaine. Under direct ultrasound guidance, the right common femoral vein was accessed with a micro puncture kit ultimately allowing placement of a 6 French, 35 cm vascular sheath. Slightly cranial to this initial access, the right common femoral was again accessed with an additional micropuncture kit  ultimately allowing placement of an additional 7 French, 35 cm vascular sheath. Ultrasound images were saved for procedural documentation purposes. With the use of a stiff glidewire, a vertebral catheter was advanced into the main pulmonary artery and a limited central pulmonary arteriogram was performed. Pressure measurements were then obtained from the main pulmonary artery. The vertebral catheter was advanced into the distal branch of the left lower lobe pulmonary artery. Limited contrast injection confirmed appropriate positioning. Over an exchange length Rosen wire, the vertebral catheter was exchanged for a 90/10 cm multi side-hole infusion catheter. Again, with the use of a stiff glidewire, a vertebral catheter was advanced into a distal branch of the right lower lobe pulmonary artery. Limited contrast injection confirmed appropriate positioning. Over an exchange length Rosen wire, the pigtail catheter was exchanged for a 90/15 cm multi side-hole infusion catheter. A postprocedural fluoroscopic image was obtained to document final catheter positioning. Both vascular sheath were secured at the left groin. The external catheter tubing was secured at the right thigh and the lytic therapy was initiated. The patient tolerated the procedure well without immediate postprocedural complication. FINDINGS: Central pulmonary arteriogram demonstrates significant near occlusive thrombus involving the peripheral aspects of both the right and left main pulmonary arteries with marked dilatation of the central pulmonary arterial system. Additionally, limited right main pulmonary arteriogram demonstrates near occlusive thrombus within the peripheral aspect of the right main pulmonary artery. Acquired pressure measurements: Main  pulmonary artery-61/16; mean-40 (normal: < 25/10) Following the procedure, both ultrasound assisted infusion catheter tips terminate within the distal aspects of the bilateral lower lobe sub segmental  pulmonary arteries. IMPRESSION: 1. Successful fluoroscopic guided initiation of bilateral ultrasound assisted catheter directed pulmonary arterial lysis for sub massive pulmonary embolism and right-sided heart strain. 2. Elevated pressure measurements within the main pulmonary artery compatible with critical pulmonary arterial hypertension. 3. Near occlusive DVT noted within the right common femoral vein. PLAN: Above findings discussed with Dr. PHart Robinsons(critical care) at the time of procedure completion and the decision  made to proceed with IVC filter placement for temporary caval interruption purposes at the time of post lysis pressure measurement acquisition. Electronically Signed   By: Sandi Mariscal M.D.   On: 08/31/2019 08:24   ECHOCARDIOGRAM COMPLETE  Result Date: 08/30/2019    ECHOCARDIOGRAM REPORT   Patient Name:   Joe Cantu Date of Exam: 08/30/2019 Medical Rec #:  [id number]     Height:       75.0 in Accession #:    [id number]    Weight:       172.0 lb Date of Birth:  Jun 13, 1951     BSA:          2.06 m Patient Age:    30 years      BP:           122/87 mmHg Patient Gender: M             HR:           98 bpm. Exam Location:  Inpatient Procedure: 2D Echo, Cardiac Doppler and Color Doppler STAT ECHO Indications:    R55 Syncope  History:        Patient has no prior history of Echocardiogram examinations.                 Aortic Rood Dilatation.  Sonographer:    Jonelle Sidle Dance Referring Phys: [id number] [place]  1. Severe RA/RV dilation with reduced RV function and severe pulmonary hypertension. LV hyperdynamic and borderline small cavity/underfilled. No hemodynamically significant valve disease.  2. Left ventricular ejection fraction, by estimation, is 65 to 70%. The left ventricle has hyperdynamic function. The left ventricle has no regional wall motion abnormalities. There is moderate concentric left ventricular hypertrophy. Left ventricular diastolic function could not be evaluated.  There is the interventricular septum is flattened in systole and diastole, consistent with right ventricular pressure and volume overload.  3. Right ventricular systolic function is moderately reduced. The right ventricular size is severely enlarged. There is severely elevated pulmonary artery systolic pressure. The estimated right ventricular systolic pressure is 30.8 mmHg.  4. Right atrial size was severely dilated.  5. The mitral valve is normal in structure and function. Trivial mitral valve regurgitation. No evidence of mitral stenosis.  6. Tricuspid valve regurgitation is moderate.  7. The aortic valve is tricuspid. Aortic valve regurgitation is trivial. No aortic stenosis is present.  8. Aortic dilatation noted. There is mild dilatation of the ascending aorta measuring 37 mm.  9. Mildly dilated pulmonary artery. 10. The inferior vena cava is dilated in size with <50% respiratory variability, suggesting right atrial pressure of 15 mmHg. 11. Cannot exclude IAS shunt. Comparison(s): No prior Echocardiogram. FINDINGS  Left Ventricle: Left ventricular ejection fraction, by estimation, is 65 to 70%. The left ventricle has hyperdynamic function. The left ventricle has no regional wall motion abnormalities. The left ventricular internal cavity size was small. There is moderate concentric left ventricular hypertrophy. The interventricular septum is flattened in systole and diastole, consistent with right ventricular pressure and volume overload. Left ventricular diastolic function could not be evaluated. Right Ventricle: The right ventricular size is severely enlarged. Right vetricular wall thickness was not assessed. Right ventricular systolic function is moderately reduced. There is severely elevated pulmonary artery systolic pressure. The tricuspid regurgitant velocity is 3.91 m/s, and with an assumed right atrial pressure of 15 mmHg, the estimated right ventricular systolic pressure is 65.7 mmHg. Left Atrium: Left  atrial size was normal in size. Right  Atrium: Right atrial size was severely dilated. Pericardium: There is no evidence of pericardial effusion. Mitral Valve: The mitral valve is normal in structure and function. Trivial mitral valve regurgitation. No evidence of mitral valve stenosis. Tricuspid Valve: The tricuspid valve is normal in structure. Tricuspid valve regurgitation is moderate . No evidence of tricuspid stenosis. Aortic Valve: The aortic valve is tricuspid. Aortic valve regurgitation is trivial. No aortic stenosis is present. Pulmonic Valve: The pulmonic valve was grossly normal. Pulmonic valve regurgitation is mild. No evidence of pulmonic stenosis. Aorta: Aortic dilatation noted. There is mild dilatation of the ascending aorta measuring 37 mm. Pulmonary Artery: The pulmonary artery is mildly dilated. Venous: The inferior vena cava is dilated in size with less than 50% respiratory variability, suggesting right atrial pressure of 15 mmHg. IAS/Shunts: Cannot exclude IAS shunt. Additional Comments: There is a small pleural effusion in both left and right lateral regions.  LEFT VENTRICLE PLAX 2D LVIDd:         3.33 cm LVIDs:         2.20 cm LV PW:         1.75 cm LV IVS:        2.02 cm LVOT diam:     2.40 cm LV SV:         72.61 ml LV SV Index:   14.28 LVOT Area:     4.52 cm  RIGHT VENTRICLE          IVC RV Basal diam:  4.20 cm  IVC diam: 2.20 cm RV Mid diam:    3.00 cm TAPSE (M-mode): 1.4 cm LEFT ATRIUM             Index       RIGHT ATRIUM           Index LA diam:        3.40 cm 1.65 cm/m  RA Area:     39.50 cm LA Vol (A2C):   42.1 ml 20.46 ml/m RA Volume:   182.50 ml 88.67 ml/m LA Vol (A4C):   43.0 ml 20.89 ml/m LA Biplane Vol: 42.4 ml 20.60 ml/m  AORTIC VALVE LVOT Vmax:   102.40 cm/s LVOT Vmean:  57.500 cm/s LVOT VTI:    0.160 m  AORTA Ao Root diam: 4.20 cm Ao Asc diam:  3.70 cm MV A velocity: 78.45 cm/s  TRICUSPID VALVE                            TR Peak grad:   61.2 mmHg                             TR Vmax:        391.00 cm/s                             SHUNTS                            Systemic VTI:  0.16 m                            Systemic Diam: 2.40 cm Buford Dresser MD Electronically signed by Buford Dresser MD Signature Date/Time: 08/30/2019/12:48:37 PM    Final    VAS [place] LOWER EXTREMITY VENOUS (DVT)  Result  Date: 08/30/2019  Lower Venous DVTStudy Indications: Pulmonary embolism, and SOB.  Comparison Study: No prior exam. Performing Technologist: Baldwin Crown ARDMS, RVT  Examination Guidelines: A complete evaluation includes B-mode imaging, spectral Doppler, color Doppler, and power Doppler as needed of all accessible portions of each vessel. Bilateral testing is considered an integral part of a complete examination. Limited examinations for reoccurring indications may be performed as noted. The reflux portion of the exam is performed with the patient in reverse Trendelenburg.  +---------+---------------+---------+-----------+----------+---------------+ RIGHT    CompressibilityPhasicitySpontaneityPropertiesThrombus Aging  +---------+---------------+---------+-----------+----------+---------------+ CFV      Partial        Yes      Yes                                  +---------+---------------+---------+-----------+----------+---------------+ SFJ      None                                                         +---------+---------------+---------+-----------+----------+---------------+ FV Prox  Full                                                         +---------+---------------+---------+-----------+----------+---------------+ FV Mid   Full                                                         +---------+---------------+---------+-----------+----------+---------------+ FV DistalFull                                                         +---------+---------------+---------+-----------+----------+---------------+ PFV      Full                                                          +---------+---------------+---------+-----------+----------+---------------+ POP      Full           Yes      Yes                                  +---------+---------------+---------+-----------+----------+---------------+ PTV      Full                                         seen with color +---------+---------------+---------+-----------+----------+---------------+ PERO     Full  seen with color +---------+---------------+---------+-----------+----------+---------------+ EIV      Full           Yes      Yes                                  +---------+---------------+---------+-----------+----------+---------------+   +---------+---------------+---------+-----------+----------+---------------+ LEFT     CompressibilityPhasicitySpontaneityPropertiesThrombus Aging  +---------+---------------+---------+-----------+----------+---------------+ CFV      None           No       No                                   +---------+---------------+---------+-----------+----------+---------------+ SFJ      Full                                                         +---------+---------------+---------+-----------+----------+---------------+ FV Prox  Full                                                         +---------+---------------+---------+-----------+----------+---------------+ FV Mid   Full                                                         +---------+---------------+---------+-----------+----------+---------------+ FV DistalNone                                                         +---------+---------------+---------+-----------+----------+---------------+ PFV      Full                                                         +---------+---------------+---------+-----------+----------+---------------+ POP      None           No       No                                    +---------+---------------+---------+-----------+----------+---------------+ PTV      Full                                         seen with color +---------+---------------+---------+-----------+----------+---------------+ PERO     Full  seen with color +---------+---------------+---------+-----------+----------+---------------+ EIV      Full                                                         +---------+---------------+---------+-----------+----------+---------------+ Poorly visusalized bilteral calf veins, visualized with color flow.    Summary: RIGHT: - Findings consistent with acute deep vein thrombosis involving the SF junction, and right common femoral vein. - Findings consistent with acute superficial vein thrombosis involving the right great saphenous vein.  LEFT: - Findings consistent with acute deep vein thrombosis involving the left common femoral vein, left femoral vein, and left popliteal vein.  *See table(s) above for measurements and observations. Electronically signed by Harold Barban MD on 08/30/2019 at 6:38:23 PM.    Final    IR INFUSION THROMBOL ARTERIAL INITIAL (MS)  Result Date: 08/31/2019 INDICATION: Sub massive pulmonary embolism. Request made for initiation of bilateral catheter directed pulmonary arterial thrombolysis. EXAM: 1. ULTRASOUND GUIDANCE FOR VENOUS ACCESS X2 2. PULMONARY ARTERIOGRAPHY 3. FLUOROSCOPIC GUIDED PLACEMENT OF BILATERAL PULMONARY ARTERIAL LYTIC INFUSION CATHETERS COMPARISON:  Chest CTA-earlier same day MEDICATIONS: None ANESTHESIA/SEDATION: Moderate (conscious) sedation was employed during this procedure. A total of Versed 2.5 mg and Fentanyl 100 mcg was administered intravenously. Moderate Sedation Time: 33 minutes. The patient's level of consciousness and vital signs were monitored continuously by radiology nursing throughout the procedure under my direct supervision.  CONTRAST:  78m OMNIPAQUE IOHEXOL 300 MG/ML  SOLN FLUOROSCOPY TIME:  9 minutes, 18 seconds (1150mGy) COMPLICATIONS: None immediate. TECHNIQUE: Informed written consent was obtained from the patient after a discussion of the risks, benefits and alternatives to treatment. Questions regarding the procedure were encouraged and answered. A timeout was performed prior to the initiation of the procedure. Ultrasound scanning was performed of the right groin and demonstrated near occlusive thrombus within the right common femoral vein. Sonographic evaluation the left groin demonstrates wide patency of the left common femoral vein. As such, the left common femoral vein was selected venous access. The left groin was prepped and draped in the usual sterile fashion, and a sterile drape was applied covering the operative field. Maximum barrier sterile technique with sterile gowns and gloves were used for the procedure. A timeout was performed prior to the initiation of the procedure. Local anesthesia was provided with 1% lidocaine. Under direct ultrasound guidance, the right common femoral vein was accessed with a micro puncture kit ultimately allowing placement of a 6 French, 35 cm vascular sheath. Slightly cranial to this initial access, the right common femoral was again accessed with an additional micropuncture kit ultimately allowing placement of an additional 7 French, 35 cm vascular sheath. Ultrasound images were saved for procedural documentation purposes. With the use of a stiff glidewire, a vertebral catheter was advanced into the main pulmonary artery and a limited central pulmonary arteriogram was performed. Pressure measurements were then obtained from the main pulmonary artery. The vertebral catheter was advanced into the distal branch of the left lower lobe pulmonary artery. Limited contrast injection confirmed appropriate positioning. Over an exchange length Rosen wire, the vertebral catheter was exchanged for a  90/10 cm multi side-hole infusion catheter. Again, with the use of a stiff glidewire, a vertebral catheter was advanced into a distal branch of the right lower lobe pulmonary artery. Limited contrast injection confirmed appropriate positioning. Over an exchange  length Rosen wire, the pigtail catheter was exchanged for a 90/15 cm multi side-hole infusion catheter. A postprocedural fluoroscopic image was obtained to document final catheter positioning. Both vascular sheath were secured at the left groin. The external catheter tubing was secured at the right thigh and the lytic therapy was initiated. The patient tolerated the procedure well without immediate postprocedural complication. FINDINGS: Central pulmonary arteriogram demonstrates significant near occlusive thrombus involving the peripheral aspects of both the right and left main pulmonary arteries with marked dilatation of the central pulmonary arterial system. Additionally, limited right main pulmonary arteriogram demonstrates near occlusive thrombus within the peripheral aspect of the right main pulmonary artery. Acquired pressure measurements: Main  pulmonary artery-61/16; mean-40 (normal: < 25/10) Following the procedure, both ultrasound assisted infusion catheter tips terminate within the distal aspects of the bilateral lower lobe sub segmental pulmonary arteries. IMPRESSION: 1. Successful fluoroscopic guided initiation of bilateral ultrasound assisted catheter directed pulmonary arterial lysis for sub massive pulmonary embolism and right-sided heart strain. 2. Elevated pressure measurements within the main pulmonary artery compatible with critical pulmonary arterial hypertension. 3. Near occlusive DVT noted within the right common femoral vein. PLAN: Above findings discussed with Dr. Hart Robinsons (critical care) at the time of procedure completion and the decision made to proceed with IVC filter placement for temporary caval interruption purposes at the time  of post lysis pressure measurement acquisition. Electronically Signed   By: Sandi Mariscal M.D.   On: 08/31/2019 08:24   IR INFUSION THROMBOL ARTERIAL INITIAL (MS)  Result Date: 08/31/2019 INDICATION: Sub massive pulmonary embolism. Request made for initiation of bilateral catheter directed pulmonary arterial thrombolysis. EXAM: 1. ULTRASOUND GUIDANCE FOR VENOUS ACCESS X2 2. PULMONARY ARTERIOGRAPHY 3. FLUOROSCOPIC GUIDED PLACEMENT OF BILATERAL PULMONARY ARTERIAL LYTIC INFUSION CATHETERS COMPARISON:  Chest CTA-earlier same day MEDICATIONS: None ANESTHESIA/SEDATION: Moderate (conscious) sedation was employed during this procedure. A total of Versed 2.5 mg and Fentanyl 100 mcg was administered intravenously. Moderate Sedation Time: 33 minutes. The patient's level of consciousness and vital signs were monitored continuously by radiology nursing throughout the procedure under my direct supervision. CONTRAST:  98m OMNIPAQUE IOHEXOL 300 MG/ML  SOLN FLUOROSCOPY TIME:  9 minutes, 18 seconds (1494mGy) COMPLICATIONS: None immediate. TECHNIQUE: Informed written consent was obtained from the patient after a discussion of the risks, benefits and alternatives to treatment. Questions regarding the procedure were encouraged and answered. A timeout was performed prior to the initiation of the procedure. Ultrasound scanning was performed of the right groin and demonstrated near occlusive thrombus within the right common femoral vein. Sonographic evaluation the left groin demonstrates wide patency of the left common femoral vein. As such, the left common femoral vein was selected venous access. The left groin was prepped and draped in the usual sterile fashion, and a sterile drape was applied covering the operative field. Maximum barrier sterile technique with sterile gowns and gloves were used for the procedure. A timeout was performed prior to the initiation of the procedure. Local anesthesia was provided with 1% lidocaine. Under  direct ultrasound guidance, the right common femoral vein was accessed with a micro puncture kit ultimately allowing placement of a 6 French, 35 cm vascular sheath. Slightly cranial to this initial access, the right common femoral was again accessed with an additional micropuncture kit ultimately allowing placement of an additional 7 French, 35 cm vascular sheath. Ultrasound images were saved for procedural documentation purposes. With the use of a stiff glidewire, a vertebral catheter was advanced into the main pulmonary artery and a  limited central pulmonary arteriogram was performed. Pressure measurements were then obtained from the main pulmonary artery. The vertebral catheter was advanced into the distal branch of the left lower lobe pulmonary artery. Limited contrast injection confirmed appropriate positioning. Over an exchange length Rosen wire, the vertebral catheter was exchanged for a 90/10 cm multi side-hole infusion catheter. Again, with the use of a stiff glidewire, a vertebral catheter was advanced into a distal branch of the right lower lobe pulmonary artery. Limited contrast injection confirmed appropriate positioning. Over an exchange length Rosen wire, the pigtail catheter was exchanged for a 90/15 cm multi side-hole infusion catheter. A postprocedural fluoroscopic image was obtained to document final catheter positioning. Both vascular sheath were secured at the left groin. The external catheter tubing was secured at the right thigh and the lytic therapy was initiated. The patient tolerated the procedure well without immediate postprocedural complication. FINDINGS: Central pulmonary arteriogram demonstrates significant near occlusive thrombus involving the peripheral aspects of both the right and left main pulmonary arteries with marked dilatation of the central pulmonary arterial system. Additionally, limited right main pulmonary arteriogram demonstrates near occlusive thrombus within the  peripheral aspect of the right main pulmonary artery. Acquired pressure measurements: Main  pulmonary artery-61/16; mean-40 (normal: < 25/10) Following the procedure, both ultrasound assisted infusion catheter tips terminate within the distal aspects of the bilateral lower lobe sub segmental pulmonary arteries. IMPRESSION: 1. Successful fluoroscopic guided initiation of bilateral ultrasound assisted catheter directed pulmonary arterial lysis for sub massive pulmonary embolism and right-sided heart strain. 2. Elevated pressure measurements within the main pulmonary artery compatible with critical pulmonary arterial hypertension. 3. Near occlusive DVT noted within the right common femoral vein. PLAN: Above findings discussed with Dr. Hart Robinsons (critical care) at the time of procedure completion and the decision made to proceed with IVC filter placement for temporary caval interruption purposes at the time of post lysis pressure measurement acquisition. Electronically Signed   By: Sandi Mariscal M.D.   On: 08/31/2019 08:24   IR THROMB F/U EVAL ART/VEN FINAL DAY (MS)  Result Date: 08/31/2019 CLINICAL DATA:  Bilateral submassive pulmonary embolism with cor pulmonale. Status post thrombo lytic infusion via each pulmonary artery for 12 hours. Request has also been made to place an IVC filter on completion thrombo lytic therapy given residual DVT in both lower extremities. EXAM: 1. FINAL DAY ARTERIAL THROMBOLYTIC THERAPY PRESSURE MEASUREMENT. 2. IVC VENOGRAM. 3. PERCUTANEOUS IVC FILTER PLACEMENT. ANESTHESIA/SEDATION: 50 mcg IV Fentanyl. The patient was just given fentanyl and not formally sedated. CONTRAST:  57m OMNIPAQUE IOHEXOL 300 MG/ML SOLN, 175mOMNIPAQUE IOHEXOL 300 MG/ML SOLN, 1087mMNIPAQUE IOHEXOL 300 MG/ML SOLN FLUOROSCOPY TIME:  1 minutes and 30 seconds.  146.6 mGy. PROCEDURE: The procedure, risks, benefits, and alternatives were explained to the patient. Questions regarding the procedure were encouraged and  answered. The patient understands and consents to the procedure. A time-out was performed prior to initiating the procedure. Pulmonary artery pressures were directly measured through the pulmonary arterial infusion catheters. The catheters were then removed. Left femoral vein sheaths placed yesterday were prepped and draped. Local anesthesia was provided with 1% Lidocaine. A guidewire was advanced into the inferior vena cava via one of the sheaths. This sheath was removed and the venotomy dilated. A 10 French deployment sheath was advanced over the guidewire. This was utilized to perform IVC venography. The deployment sheath was further positioned in an appropriate location for filter deployment. A Bard Denali IVC filter was then advanced in the sheath. This was then  fully deployed in the infrarenal IVC. Final filter position was confirmed with a fluoroscopic spot image. After the procedure the sheath was removed and hemostasis obtained with manual compression. COMPLICATIONS: None. FINDINGS: Measure pulmonary artery pressure is 55/20, mean 28 mm Hg compared to 61/16, 40 mm Hg prior to lytic therapy. IVC venography demonstrates a normal caliber IVC with no evidence of thrombus. Renal veins are identified bilaterally. The IVC filter was successfully positioned below the level of the renal veins and is appropriately oriented. This IVC filter has both permanent and retrievable indications. IMPRESSION: 1. Decrease in measured pulmonary artery pressure after 12 hours of bilateral pulmonary arterial thrombolytic therapy. 2. Placement of percutaneous IVC filter in infrarenal IVC. IVC venogram shows no evidence of IVC thrombus and normal caliber of the inferior vena cava. This filter does have both permanent and retrievable indications. PLAN: This IVC filter is potentially retrievable. The patient will be assessed for filter retrieval by Interventional Radiology in approximately 8-12 weeks. Further recommendations regarding  filter retrieval, continued surveillance or declaration of device permanence, will be made at that time. Electronically Signed   By: Aletta Edouard M.D.   On: 08/31/2019 14:25    Microbiology: Recent Results (from the past 240 hour(s))  SARS CORONAVIRUS 2 (TAT 6-24 HRS) Nasopharyngeal Nasopharyngeal Swab     Status: None   Collection Time: 08/30/19  7:48 AM   Specimen: Nasopharyngeal Swab  Result Value Ref Range Status   SARS Coronavirus 2 NEGATIVE NEGATIVE Final    Comment: (NOTE) SARS-CoV-2 target nucleic acids are NOT DETECTED. The SARS-CoV-2 RNA is generally detectable in upper and lower respiratory specimens during the acute phase of infection. Negative results do not preclude SARS-CoV-2 infection, do not rule out co-infections with other pathogens, and should not be used as the sole basis for treatment or other patient management decisions. Negative results must be combined with clinical observations, patient history, and epidemiological information. The expected result is Negative. Fact Sheet for Patients: SugarRoll[employer]be Fact Sheet for Healthcare Providers: https://www.woods-mathews.com/ This test is not yet approved or cleared by the [place] FDA and  has been authorized for detection and/or diagnosis of SARS-CoV-2 by FDA under an Emergency Use Authorization (EUA). This EUA will remain  in effect (meaning this test can be used) for the duration of the COVID-19 declaration under Section 56 4(b)(1) of the Act, 21 U.S.C. section 360bbb-3(b)(1), unless the authorization is terminated or revoked sooner. Performed at [facility], [place] [address], [place], [place] [address]   MRSA PCR Screening     Status: None   Collection Time: 08/30/19 12:35 PM   Specimen: Nasal Mucosa; Nasopharyngeal  Result Value Ref Range Status   MRSA by PCR NEGATIVE NEGATIVE Final    Comment:        The GeneXpert MRSA Assay (FDA approved for NASAL  specimens only), is one component of a comprehensive MRSA colonization surveillance program. It is not intended to diagnose MRSA infection nor to guide or monitor treatment for MRSA infections. Performed at [facility], [place] [address], [place], [place] [address]      Labs: Basic Metabolic Panel: Recent Labs  Lab 08/30/19 0445 08/30/19 0453 08/31/19 0430  NA 139 140 140  K 4.4 4.3 4.2  CL 108 107 113*  CO2 22  --  18*  GLUCOSE 130* 123* 119*  BUN 21 18 24*  CREATININE 0.83 0.80 0.84  CALCIUM 10.5*  --  9.2   Liver Function Tests: Recent Labs  Lab 08/30/19 0445 08/31/19 0430  AST 87* 57*  ALT 79* 73*  ALKPHOS 89 76  BILITOT 0.9 1.3*  PROT 7.6 5.9*  ALBUMIN 3.5 2.7*   No results for input(s): LIPASE, AMYLASE in the last 168 hours. No results for input(s): AMMONIA in the last 168 hours. CBC: Recent Labs  Lab 08/30/19 0445 08/30/19 0445 08/30/19 0453 08/31/19 0019 08/31/19 0430 09/01/19 0451 09/02/19 0652  WBC 10.0  --   --  12.2* 10.5 8.4 6.7  NEUTROABS 7.8*  --   --   --   --   --   --   HGB 12.8*   < > 12.9* 12.2* 11.0* 10.8* 10.7*  HCT 38.3*   < > 38.0* 36.6* 32.8* 32.3* 30.9*  MCV 93.4  --   --  95.1 93.7 94.2 92.0  PLT 296  --   --  254 263 208 252   < > = values in this interval not displayed.   Cardiac Enzymes: No results for input(s): CKTOTAL, CKMB, CKMBINDEX, TROPONINI in the last 168 hours. BNP: BNP (last 3 results) Recent Labs    08/30/19 0656  BNP 1,129.6*    ProBNP (last 3 results) No results for input(s): PROBNP in the last 8760 hours.  CBG: Recent Labs  Lab 08/30/19 0422  GLUCAP 112*       Signed:  Lelon Frohlich  [facility] Pager: [phone] 09/02/2019, 11:37 AM

## 2019-09-02 NOTE — Progress Notes (Signed)
ANTICOAGULATION CONSULT NOTE Pharmacy Consult for Xarelto Indication: pulmonary embolus  No Known Allergies  Patient Measurements: Height: 6\' 3"  (190.5 cm) Weight: 172 lb (78 kg) IBW/kg (Calculated) : 84.5 Heparin Dosing Weight: actual  Vital Signs: Temp: 97.9 F (36.6 C) (02/20 0439) Temp Source: Oral (02/20 0439) BP: 141/91 (02/20 0439) Pulse Rate: 77 (02/20 0439)  Labs: Recent Labs    08/31/19 0430 08/31/19 0430 08/31/19 1418 09/01/19 0451 09/02/19 0652  HGB 11.0*   < >  --  10.8* 10.7*  HCT 32.8*  --   --  32.3* 30.9*  PLT 263  --   --  208 252  HEPARINUNFRC 0.55   < > 0.48 0.50 0.30  CREATININE 0.84  --   --   --   --    < > = values in this interval not displayed.    Estimated Creatinine Clearance: 95.4 mL/min (by C-G formula based on SCr of 0.84 mg/dL).  Assessment: 27 yoM c/o loss of consciousness found to have massive PE with complete occlusion of the right main pulmonary artery and absent pulmonary arterial blood flow to right lung. Patient underwent catheter directed thrombolysis on 2/17 around 17:00. Pharmacy to dose IV heparin for PE  Baselines: Hg 12.9, PLTC 296, INR 1.2, aPTT 28   2/17  Confirmatory heparin level therapeutic at 0.47 units/hr  Catheter directed thrombolysis completed at 1700 without complication  Post procedural CBC pending  2248 HL = 0.50 at goal, Patient has some bleeding from femoral venipuncture sites- MD ordered thrombi pads to site  Per updated protocol, heparin is no longer paused for sheath removal   Today, 09/02/2019 No bleed noted  0700 Hep level = 0.3 units/ml, in therapeutic range Plan transition to Xarelto this morning  Goal of Therapy:  Heparin level 0.3-0.7 units/ml Monitor platelets by anticoagulation protocol: Yes   Plan:  Discontinue Heparin infusion at 0900 Begin Xarelto 15mg  bid with meals at 0900, for 21 days followed by 20mg  daily with breakfast Plan Xarelto education for patient  09/04/2019  PharmD [phone] 09/02/2019, 7:33 AM

## 2019-09-02 NOTE — Evaluation (Signed)
Physical Therapy One Time Evaluation Patient Details Name: Joe Cantu MRN: [id number] DOB: 11-24-1950 Today's Date: 09/02/2019   History of Present Illness  Joe Cantu is a 69 y.o. male with medical history significant of frequent PVCs and aortic root dilation and admitted for syncopal event . CTA of the chest was significant for a massive PE with complete occlusion of the right main artery with absent arterial blood flow to right lung in addition to partially occlusive filling defects of left lower lobe pulmonary arteries; also evidence of significant right heart strain with associated right heart dilation.  Pt admitted for treatment of acute massive PE and left lower extremity deep vein thrombosis with significant right heart dysfunction  Clinical Impression  Patient evaluated by Physical Therapy with no further acute PT needs identified. All education has been completed and the patient has no further questions. Pt mobilizing well with RW and states he believes he has one at home.  Monitored oxygen saturations with activity and SPO2 remained 92-98% on room air.  Pt encouraged to stop and rest as needed with exertional tasks upon d/c. See below for any follow-up Physical Therapy or equipment needs. PT is signing off. Thank you for this referral.     Follow Up Recommendations No PT follow up    Equipment Recommendations  None recommended by PT    Recommendations for Other Services       Precautions / Restrictions Precautions Precautions: Fall      Mobility  Bed Mobility Overal bed mobility: Needs Assistance Bed Mobility: Supine to Sit;Sit to Supine     Supine to sit: Supervision Sit to supine: Supervision      Transfers Overall transfer level: Needs assistance Equipment used: Rolling walker (2 wheeled) Transfers: Sit to/from Stand Sit to Stand: Supervision Stand pivot transfers: Min guard       General transfer comment: verbal cues for hand  placement  Ambulation/Gait Ambulation/Gait assistance: Min guard;Supervision Gait Distance (Feet): 160 Feet Assistive device: Rolling walker (2 wheeled) Gait Pattern/deviations: Step-through pattern;Decreased stride length     General Gait Details: verbal cues for RW positioning, pt reports mild dyspnea end of ambulation however SPO2 92-98% room air  Stairs            Wheelchair Mobility    Modified Rankin (Stroke Patients Only)       Balance Overall balance assessment: No apparent balance deficits (not formally assessed) Sitting-balance support: No upper extremity supported Sitting balance-Leahy Scale: Good     Standing balance support: No upper extremity supported Standing balance-Leahy Scale: Fair Standing balance comment: at least fair, utilized RW for endurance                             Pertinent Vitals/Pain Pain Assessment: No/denies pain    Home Living Family/patient expects to be discharged to:: Private residence Living Arrangements: Other relatives Available Help at Discharge: Family Type of Home: House Home Access: Stairs to enter   Technical brewer of Steps: 3 Home Layout: One level Home Equipment: [place] - single point;Walker - 2 wheels      Prior Function Level of Independence: Independent               Hand Dominance        Extremity/Trunk Assessment   Upper Extremity Assessment Upper Extremity Assessment: Generalized weakness    Lower Extremity Assessment Lower Extremity Assessment: Overall WFL for tasks assessed    Cervical /  Trunk Assessment Cervical / Trunk Assessment: Normal  Communication   Communication: No difficulties  Cognition Arousal/Alertness: Awake/alert Behavior During Therapy: WFL for tasks assessed/performed Overall Cognitive Status: Within Functional Limits for tasks assessed                                        General Comments      Exercises      Assessment/Plan    PT Assessment Patent does not need any further PT services  PT Problem List         PT Treatment Interventions      PT Goals (Current goals can be found in the Care Plan section)  Acute Rehab PT Goals Patient Stated Goal: home tomorrow PT Goal Formulation: All assessment and education complete, DC therapy    Frequency     Barriers to discharge        Co-evaluation               AM-PAC PT "6 Clicks" Mobility  Outcome Measure Help needed turning from your back to your side while in a flat bed without using bedrails?: None Help needed moving from lying on your back to sitting on the side of a flat bed without using bedrails?: None Help needed moving to and from a bed to a chair (including a wheelchair)?: None Help needed standing up from a chair using your arms (e.g., wheelchair or bedside chair)?: A Little Help needed to walk in hospital room?: A Little Help needed climbing 3-5 steps with a railing? : A Little 6 Click Score: 21    End of Session Equipment Utilized During Treatment: Gait belt Activity Tolerance: Patient tolerated treatment well Patient left: in bed;with call bell/phone within reach;with bed alarm set Nurse Communication: Mobility status PT Visit Diagnosis: Difficulty in walking, not elsewhere classified (R26.2)    Time: 3559-7416 PT Time Calculation (min) (ACUTE ONLY): 17 min   Charges:   PT Evaluation $PT Eval Low Complexity: 1 Low        Kati PT, DPT Acute Rehabilitation Services Office: [phone]  Sarajane Jews 09/02/2019, 4:01 PM

## 2019-09-02 NOTE — Evaluation (Addendum)
Occupational Therapy Evaluation Patient Details Name: Joe Cantu MRN: [id number] DOB: [birth year] Today's Date: 09/02/2019    History of Present Illness Joe Cantu is a 69 y.o. male with medical history significant of frequent PVCs and aortic root dilation. Patient reported passing out this morning around 2:00 AM after urinating. He reports feeling weak to the point of easing down to the ground. He reports no one witnessed his syncopal episode. He is unsure of how long he was down and he awoke to the paramedics arriving. He reports that his fiance found him. He has some dyspnea on exertion but is comfortable at rest. No chest pain. Patient reports his mother had a blood clot with associated stroke. No other risk factors noted. CTA of the chest was significant for a massive PE with complete occlusion of the right main artery with absent arterial blood flow to right lung in addition to partially occlusive filling defects of left lower lobe pulmonary arteries; also evidence of significant right heart strain with associated right heart dilation   Clinical Impression   Pt admitted after passing out. Pt overall min A this Cantu and feeling over weak.   Pt currently with functional limitations due to the deficits listed below (see OT Problem List).  Pt will benefit from skilled OT to increase their safety and independence with ADL and functional mobility for ADL to facilitate discharge to venue listed below.      Follow Up Recommendations  Home health OT;Supervision/Assistance - 24 hour    Equipment Recommendations  None recommended by OT    Recommendations for Other Services       Precautions / Restrictions Precautions Precautions: Fall      Mobility Bed Mobility Overal bed mobility: Needs Assistance Bed Mobility: Supine to Sit     Supine to sit: Supervision        Transfers Overall transfer level: Needs assistance Equipment used: Rolling walker (2 wheeled) Transfers: Sit  to/from [facility] Sit to Stand: Min guard Stand pivot transfers: Min guard       General transfer comment: VC for hand placement    Balance Overall balance assessment: Needs assistance Sitting-balance support: No upper extremity supported Sitting balance-Leahy Scale: Good     Standing balance support: Bilateral upper extremity supported Standing balance-Leahy Scale: Fair                             ADL either performed or assessed with clinical judgement   ADL Overall ADL's : Needs assistance/impaired Eating/Feeding: Set up;Sitting   Grooming: Min guard;Standing   Upper Body Bathing: Set up;Sitting   Lower Body Bathing: Minimal assistance;Sit to/from stand;Cueing for safety;Cueing for compensatory techniques;Cueing for sequencing   Upper Body Dressing : Set up;Sitting   Lower Body Dressing: Minimal assistance;Sit to/from stand;Cueing for safety;Cueing for sequencing   Toilet Transfer: Min guard;Ambulation;Cueing for safety;Cueing for sequencing   Toileting- Clothing Manipulation and Hygiene: Min guard;Cueing for safety;Sit to/from stand         General ADL Comments: pt overall feeling weak.  Pt typically I with ADL activity and is currently min guard A     Vision Baseline Vision/History: Wears glasses Patient Visual Report: No change from baseline              Pertinent Vitals/Pain Pain Assessment: No/denies pain     Hand Dominance     Extremity/Trunk Assessment Upper Extremity Assessment Upper Extremity Assessment: Generalized weakness  Cervical / Trunk Assessment Cervical / Trunk Assessment: Normal   Communication Communication Communication: No difficulties   Cognition Arousal/Alertness: Awake/alert Behavior During Therapy: WFL for tasks assessed/performed Overall Cognitive Status: Within Functional Limits for tasks assessed                                                Home Living  Family/patient expects to be discharged to:: Private residence Living Arrangements: Other relatives Available Help at Discharge: Family Type of Home: House Home Access: Stairs to enter Secretary/administrator of Steps: 3   Home Layout: One level     Bathroom Shower/Tub: Walk-in shower;Tub/shower unit         Home Equipment: Cane - single point          Prior Functioning/Environment Level of Independence: Independent                 OT Problem List: Decreased activity tolerance;Impaired balance (sitting and/or standing)      OT Treatment/Interventions: Self-care/ADL training;Patient/family education;DME and/or AE instruction    OT Goals(Current goals can be found in the care plan section) Acute Rehab OT Goals Patient Stated Goal: home tomorrow OT Goal Formulation: With patient Time For Goal Achievement: 09/09/19 ADL Goals Pt Will Transfer to Toilet: with modified independence;ambulating Pt Will Perform Toileting - Clothing Manipulation and hygiene: with modified independence;sit to/from stand Pt Will Perform Tub/Shower Transfer: Tub transfer  OT Frequency: Min 2X/week    AM-PAC OT "6 Clicks" Daily Activity     Outcome Measure Help from another person eating meals?: None Help from another person taking care of personal grooming?: None Help from another person toileting, which includes using toliet, bedpan, or urinal?: A Little Help from another person bathing (including washing, rinsing, drying)?: A Little Help from another person to put on and taking off regular upper body clothing?: None Help from another person to put on and taking off regular lower body clothing?: A Little 6 Click Score: 21   End of Session Equipment Utilized During Treatment: Rolling walker Nurse Communication: Mobility status  Activity Tolerance: Patient limited by fatigue Patient left: in chair  OT Visit Diagnosis: Unsteadiness on feet (R26.81);Muscle weakness (generalized)  (M62.81);History of falling (Z91.81)                Time: 1937-9024 OT Time Calculation (min): 25 min Charges:  OT General Charges $OT Visit: 1 Visit OT Evaluation $OT Eval Moderate Complexity: 1 Mod OT Treatments $Self Care/Home Management : 8-22 mins  Lise Auer, OT Acute Rehabilitation Services [phone] Office- [phone]     Tadd Holtmeyer, Karin Golden D 09/02/2019, 2:20 PM

## 2019-09-02 NOTE — Discharge Instructions (Signed)
Information on my medicine - XARELTO (rivaroxaban)  This medication education was reviewed with me or my healthcare representative as part of my discharge preparation.  The pharmacist that spoke with me during my hospital stay was:  Otho Bellows, [facility]  WHY WAS XARELTO PRESCRIBED FOR YOU? Xarelto was prescribed to treat blood clots that may have been found in the veins of your legs (deep vein thrombosis) or in your lungs (pulmonary embolism) and to reduce the risk of them occurring again.  What do you need to know about Xarelto? The starting dose is one 15 mg tablet taken TWICE daily with food for the FIRST 21 DAYS then on (enter date)  09/23/2019  the dose is changed to one 20 mg tablet taken ONCE A DAY with your evening meal.  DO NOT stop taking Xarelto without talking to the health care provider who prescribed the medication.  Refill your prescription for 20 mg tablets before you run out.  After discharge, you should have regular check-up appointments with your healthcare provider that is prescribing your Xarelto.  In the future your dose may need to be changed if your kidney function changes by a significant amount.  What do you do if you miss a dose? If you are taking Xarelto TWICE DAILY and you miss a dose, take it as soon as you remember. You may take two 15 mg tablets (total 30 mg) at the same time then resume your regularly scheduled 15 mg twice daily the next day.  If you are taking Xarelto ONCE DAILY and you miss a dose, take it as soon as you remember on the same day then continue your regularly scheduled once daily regimen the next day. Do not take two doses of Xarelto at the same time.   Important Safety Information Xarelto is a blood thinner medicine that can cause bleeding. You should call your healthcare provider right away if you experience any of the following: ? Bleeding from an injury or your nose that does not stop. ? Unusual colored urine (red or dark brown) or  unusual colored stools (red or black). ? Unusual bruising for unknown reasons. ? A serious fall or if you hit your head (even if there is no bleeding).  Some medicines may interact with Xarelto and might increase your risk of bleeding while on Xarelto. To help avoid this, consult your healthcare provider or pharmacist prior to using any new prescription or non-prescription medications, including herbals, vitamins, non-steroidal anti-inflammatory drugs (NSAIDs) and supplements.  This website has more information on Xarelto: [email or web address].com.br.

## 2019-09-03 LAB — PROTEIN C, TOTAL: Protein C, Total: 74 % (ref 60–150)

## 2019-09-04 ENCOUNTER — Telehealth: Payer: Self-pay

## 2019-09-04 ENCOUNTER — Ambulatory Visit: Payer: BC Managed Care – PPO | Attending: Family

## 2019-09-04 DIAGNOSIS — Z23 Encounter for immunization: Secondary | ICD-10-CM | POA: Insufficient documentation

## 2019-09-04 LAB — PROTHROMBIN GENE MUTATION

## 2019-09-04 LAB — HOMOCYSTEINE

## 2019-09-04 NOTE — Telephone Encounter (Signed)
I called and spoke with the patient and made him a follow up in clinic for 09/21/19 at 3:15

## 2019-09-04 NOTE — Progress Notes (Signed)
   Covid-19 Vaccination Clinic  Name:  Joe Cantu    MRN: [id number] DOB: [birth year]  09/04/2019  Joe Cantu was observed post Covid-19 immunization for 30 minutes based on pre-vaccination screening without incidence. He was provided with Vaccine Information Sheet and instruction to access the V-Safe system.   Joe Cantu was instructed to call 911 with any severe reactions post vaccine: Marland Kitchen Difficulty breathing  . Swelling of your face and throat  . A fast heartbeat  . A bad rash all over your body  . Dizziness and weakness    Immunizations Administered    Name Date Dose VIS Date Route   Moderna COVID-19 Vaccine 09/04/2019 11:48 AM 0.5 mL 06/13/2019 Intramuscular   Manufacturer: Moderna   Lot: [id number]   NDC: 79396-886-48

## 2019-09-04 NOTE — Telephone Encounter (Signed)
-----   Message from Chilton Greathouse, MD sent at 08/31/2019 12:31 PM EST ----- Regarding: follow up Can you arrange follow up in clinic  with me in 2-4 weeks. Thanks

## 2019-09-05 LAB — FACTOR 5 LEIDEN

## 2019-09-07 ENCOUNTER — Telehealth: Payer: Self-pay | Admitting: Pulmonary Disease

## 2019-09-07 NOTE — Telephone Encounter (Signed)
Attempted to call the pt but he did not answer so I have left him a msg to call back

## 2019-09-07 NOTE — Telephone Encounter (Signed)
Pt calling back [phone]

## 2019-09-08 ENCOUNTER — Other Ambulatory Visit: Payer: Self-pay | Admitting: Cardiology

## 2019-09-08 ENCOUNTER — Ambulatory Visit: Payer: BC Managed Care – PPO | Admitting: Primary Care

## 2019-09-08 ENCOUNTER — Encounter: Payer: Self-pay | Admitting: Primary Care

## 2019-09-08 ENCOUNTER — Other Ambulatory Visit: Payer: Self-pay

## 2019-09-08 VITALS — BP 120/70 | HR 100 | Ht 75.0 in | Wt 172.2 lb

## 2019-09-08 DIAGNOSIS — I493 Ventricular premature depolarization: Secondary | ICD-10-CM | POA: Diagnosis not present

## 2019-09-08 DIAGNOSIS — I2699 Other pulmonary embolism without acute cor pulmonale: Secondary | ICD-10-CM | POA: Diagnosis not present

## 2019-09-08 DIAGNOSIS — R0602 Shortness of breath: Secondary | ICD-10-CM

## 2019-09-08 DIAGNOSIS — I82493 Acute embolism and thrombosis of other specified deep vein of lower extremity, bilateral: Secondary | ICD-10-CM

## 2019-09-08 MED ORDER — METOPROLOL TARTRATE 50 MG PO TABS: 50.0000 mg | ORAL_TABLET | Freq: Two times a day (BID) | ORAL | 0 refills | Status: DC

## 2019-09-08 NOTE — Patient Instructions (Addendum)
Recommendations: - Continue Xarelto 20mg  daily (life long therapy) - IVC filter due to be removed in late May 2021  - You do not require supplemental oxygen  - Review patient education on bleeding precautions with blood thinners (attached)  Referral: - Dr. June 2021 with Cardiology- right heart strain; PVCs - Pulmonary rehab re: dyspnea; PE (if no availability refer physical therapy for deconditioning)  RX: - Refilling metoprolol 50mg  twice daily (further refills need to come from PCP or cardiology)  Orders: - Echocardiogram in 3 months (May 12th)  Follow-up: - 3 months with Dr. after echocardiogram     Pulmonary Embolism  A pulmonary embolism (PE) is a sudden blockage or decrease of blood flow in one or both lungs. Most blockages come from a blood clot that forms in the vein of a lower leg, thigh, or arm (deep vein thrombosis, DVT) and travels to the lungs. A clot is blood that has thickened into a gel or solid. PE is a dangerous and life-threatening condition that needs to be treated right away. What are the causes? This condition is usually caused by a blood clot that forms in a vein and moves to the lungs. In rare cases, it may be caused by air, fat, part of a tumor, or other tissue that moves through the veins and into the lungs. What increases the risk? The following factors may make you more likely to develop this condition:  Experiencing a traumatic injury, such as breaking a hip or leg.  Having: ? A spinal cord injury. ? Orthopedic surgery, especially hip or knee replacement. ? Any major surgery. ? A stroke. ? DVT. ? Blood clots or blood clotting disease. ? Long-term (chronic) lung or heart disease. ? Cancer treated with chemotherapy. ? A central venous catheter.  Taking medicines that contain estrogen. These include birth control pills and hormone replacement therapy.  Being: ? Pregnant. ? In the period of time after your baby is delivered  (postpartum). ? Older than age 64. ? Overweight. ? A smoker, especially if you have other risks. What are the signs or symptoms? Symptoms of this condition usually start suddenly and include:  Shortness of breath during activity or at rest.  Coughing, coughing up blood, or coughing up blood-tinged mucus.  Chest pain that is often worse with deep breaths.  Rapid or irregular heartbeat.  Feeling light-headed or dizzy.  Fainting.  Feeling anxious.  Fever.  Sweating.  Pain and swelling in a leg. This is a symptom of DVT, which can lead to PE. How is this diagnosed? This condition may be diagnosed based on:  Your medical history.  A physical exam.  Blood tests.  CT pulmonary angiogram. This test checks blood flow in and around your lungs.  Ventilation-perfusion scan, also called a lung VQ scan. This test measures air flow and blood flow to the lungs.  An ultrasound of the legs. How is this treated? Treatment for this condition depends on many factors, such as the cause of your PE, your risk for bleeding or developing more clots, and other medical conditions you have. Treatment aims to remove, dissolve, or stop blood clots from forming or growing larger. Treatment may include:  Medicines, such as: ? Blood thinning medicines (anticoagulants) to stop clots from forming. ? Medicines that dissolve clots (thrombolytics).  Procedures, such as: ? Using a flexible tube to remove a blood clot (embolectomy) or to deliver medicine to destroy it (catheter-directed thrombolysis). ? Inserting a filter into a large vein that carries  blood to the heart (inferior vena cava). This filter (vena cava filter) catches blood clots before they reach the lungs. ? Surgery to remove the clot (surgical embolectomy). This is rare. You may need a combination of immediate, long-term (up to 3 months after diagnosis), and extended (more than 3 months after diagnosis) treatments. Your treatment may  continue for several months (maintenance therapy). You and your health care provider will work together to choose the treatment program that is best for you. Follow these instructions at home: Medicines  Take over-the-counter and prescription medicines only as told by your health care provider.  If you are taking an anticoagulant medicine: ? Take the medicine every day at the same time each day. ? Understand what foods and drugs interact with your medicine. ? Understand the side effects of this medicine, including excessive bruising or bleeding. Ask your health care provider or pharmacist about other side effects. General instructions  Wear a medical alert bracelet or carry a medical alert card that says you have had a PE and lists what medicines you take.  Ask your health care provider when you may return to your normal activities. Avoid sitting or lying for a long time without moving.  Maintain a healthy weight. Ask your health care provider what weight is healthy for you.  Do not use any products that contain nicotine or tobacco, such as cigarettes, e-cigarettes, and chewing tobacco. If you need help quitting, ask your health care provider.  Talk with your health care provider about any travel plans. It is important to make sure that you are still able to take your medicine while on trips.  Keep all follow-up visits as told by your health care provider. This is important. Contact a health care provider if:  You missed a dose of your blood thinner medicine. Get help right away if:  You have: ? New or increased pain, swelling, warmth, or redness in an arm or leg. ? Numbness or tingling in an arm or leg. ? Shortness of breath during activity or at rest. ? A fever. ? Chest pain. ? A rapid or irregular heartbeat. ? A severe headache. ? Vision changes. ? A serious fall or accident, or you hit your head. ? Stomach (abdominal) pain. ? Blood in your vomit, stool, or urine. ? A cut  that will not stop bleeding.  You cough up blood.  You feel light-headed or dizzy.  You cannot move your arms or legs.  You are confused or have memory loss. These symptoms may represent a serious problem that is an emergency. Do not wait to see if the symptoms will go away. Get medical help right away. Call your local emergency services (911 in the [place] Do not drive yourself to the hospital. Summary  A pulmonary embolism (PE) is a sudden blockage or decrease of blood flow in one or both lungs. PE is a dangerous and life-threatening condition that needs to be treated right away.  Treatments for this condition usually include medicines to thin your blood (anticoagulants) or medicines to break apart blood clots (thrombolytics).  If you are given blood thinners, it is important to take the medicine every day at the same time each day.  Understand what foods and drugs interact with any medicines that you are taking.  If you have signs of PE or DVT, call your local emergency services (911 in the [place] This information is not intended to replace advice given to you by your health care provider. Make sure  you discuss any questions you have with your health care provider. Document Revised: 04/06/2018 Document Reviewed: 04/06/2018 Elsevier Patient Education  2020 [employer].     Bleeding Precautions When on Anticoagulant Therapy, Adult Anticoagulant therapy, also called blood thinner therapy, is medicine that helps to prevent and treat blood clots. The medicine works by stopping blood clots from forming or growing. Blood clots that form in your blood vessels can be dangerous. They can break loose and travel to the heart, lungs, or brain. This increases the risk of a heart attack, stroke, or blocked lung artery (pulmonary embolism). Anticoagulants also increase the risk of bleeding. Try to protect yourself from cuts and other injuries that can cause bleeding. It is important to take  anticoagulants exactly as told by your health care provider. Why do I need to be on anticoagulant therapy? You may need this medicine if you are at risk of developing a blood clot. Conditions that increase your risk of a blood clot include:  Being born with heart disease or a heart malformation (congenital heart disease).  Developing heart disease.  Having had surgery, such as valve replacement.  Having had a serious accident or other type of severe injury (trauma).  Having certain types of cancer.  Having certain diseases that can increase blood clotting.  Having a high risk of stroke or heart attack.  Having atrial fibrillation (AF). What are the common anticoagulant medicines? There are several types of anticoagulant medicines. The most common types are:  Medicines that you take by mouth (oral medicines), such as: ? Warfarin. ? Novel oral anticoagulants (NOACs), such as:  Direct thrombin inhibitors (dabigatran).  Factor Xa inhibitors (apixaban, edoxaban, and rivaroxaban).  Injections, such as: ? Unfractionated heparin. ? Low molecular weight heparin. These anticoagulants work in different ways to prevent blood clots. They also have different risks and side effects. What do I need to remember while on anticoagulant therapy? Taking anticoagulants  Take your medicine at the same time every day. If you forget to take your medicine, take it as soon as you remember. Do not double your dosage of medicine if you miss a whole day. Take your normal dose and call your health care provider.  Do not stop taking your medicine unless your health care provider approves. Stopping the medicine can increase your risk of developing a blood clot. Taking other medicines  Take over-the-counter and prescriptions medicines only as told by your health care provider.  Do not take over-the-counter NSAIDs, including aspirin and ibuprofen, while you are on anticoagulant therapy. These medicines  increase your risk of dangerous bleeding.  Get approval from your health care provider before you start taking any new medicines, vitamins, or herbal products. Some of these could interfere with your therapy. General instructions  Keep all follow-up visits as told by your health care provider. This is important.  If you are pregnant or trying to get pregnant, talk with a health care provider about anticoagulants. Some of these medicines are not safe to take during pregnancy.  Tell all health care providers, including your dentist, that you are on anticoagulant therapy. It is especially important to tell providers before you have any surgery, medical procedures, or dental work done. What precautions should I take?   Be very careful when using knives, scissors, or other sharp objects.  Use an electric razor instead of a blade.  Do not use toothpicks.  Use a soft-bristled toothbrush. Brush your teeth gently.  Always wear shoes outdoors and wear slippers  indoors.  Be careful when cutting your fingernails and toenails.  Place bath mats in the bathroom. If possible, install handrails as well.  Wear gloves while you do yard work.  Wear your seat belt.  Prevent falls by removing loose rugs and extension cords from areas where you walk. Use a cane or walker if you need it.  Avoid constipation by: ? Drinking enough fluid to keep your urine clear or pale yellow. ? Eating foods that are high in fiber, such as fresh fruits and vegetables, whole grains, and beans. ? Limiting foods that are high in fat and processed sugars, such as fried and sweet foods.  Do not play contact sports or participate in other activities that have a high risk for injury. What other precautions are important if on warfarin therapy? If you are taking a type of anticoagulant called warfarin, make sure you:  Work with a diet and nutrition specialist (dietitian) to make an eating plan. Do not make any sudden changes  to your diet after you have started your eating plan.  Do not drink alcohol. It can interfere with your medicine and increase your risk of an injury that causes bleeding.  Get regular blood tests as told by your health care provider. What are some questions to ask my health care provider?  Why do I need anticoagulant therapy?  What is the best anticoagulant therapy for my condition?  How long will I need anticoagulant therapy?  What are the side effects of anticoagulant therapy?  When should I take my medicine? What should I do if I forget to take it?  Will I need to have regular blood tests?  Do I need to change my diet? Are there foods or drinks that I should avoid?  What activities are safe for me?  What should I do if I want to get pregnant? Contact a health care provider if:  You miss a dose of medicine: ? And you are not sure what to do. ? For more than one day.  You have: ? Menstrual bleeding that is heavier than normal. ? Bloody or brown urine. ? Easy bruising. ? Black and tarry stool or bright red stool. ? Side effects from your medicine.  You feel weak or dizzy.  You become pregnant. Get help right away if:  You have bleeding that will not stop within 20 minutes from: ? The nose. ? The gums. ? A cut on the skin.  You have a severe headache or stomachache.  You vomit or cough up blood.  You fall or hit your head. Summary  Anticoagulant therapy, also called blood thinner therapy, is medicine that helps to prevent and treat blood clots.  Anticoagulants work in different ways to prevent blood clots. They also have different risks and side effects.  Talk with your health care provider about any precautions that you should take while on anticoagulant therapy. This information is not intended to replace advice given to you by your health care provider. Make sure you discuss any questions you have with your health care provider. Document Revised:  10/19/2018 Document Reviewed: 09/15/2016 Elsevier Patient Education  2020 [employer].

## 2019-09-08 NOTE — Progress Notes (Signed)
_0  ID: Joe Cantu, male    DOB: [birth year], 69 y.o.   MRN: [id number]  Chief Complaint  Patient presents with  . Hospitalization Follow-up    Patient is here to follow up after being in the hospital. Patient was admitted for a PE on 08/30/2019. Patient is having shortness of breath with exertion, cough with clear/white sputum and feels weak.     Referring provider: Iona Beard, MD  HPI: 69 year old male, former smoker quit in 1970. PMH significant for PVCs, aoritc root dilation, dyspnea on exertion, glaucoma, pneumonia, acute massive pulmonary embolism, bilateral DVT. Recently admitted for bilateral PE with right heart strain. Patient consulted in-patient by Dr. Vaughan Browner on 08/30/19. Received catheter directed TPA on 2/17, IVC filter placed 2/18 and started on lifelong Xarelto. He was also started on Metroprol 69m twice daily for PVCs.  Hospital course: Admitted 08/30/19 after syncopal episode. EMS called. Hypotensive in ED, short of breath x 2 weeks. CTA showed bilateral PE with near complete occlusion right pulmonary artery. Started on heparin gtt. PCCM consulted and recommended catheter directed thrombolytics. IVC filter placed secondary to DVT, significant clot burden and RV dysfunction, will need removed in 2-3 months (May 2021). Transitioned to Xarelto 256m1 tab daily. He was also started on Metroprol 5053mwice daily for PVCs. He will need repeat echocardiogram outpatient.   09/08/2019 Patient presents today for hospital follow-up. He is doing well. He still has shortness of breath with exertion. Associated fatigue and weakness. No oxygen desaturation on ambulatory walk. Needs refill of his metoprolol. Denies syncope, chest pain, chest tightness or wheezing.    Significant testing: CTA 08/30/2019-bilateral pulmonary emboli with near complete occlusion of the right pulmonary artery  Echo 08/30/2019-severe RA/RV dilatation with severe pulmonary hypertension, LVEF 65-70%  No  Known Allergies  Immunization History  Administered Date(s) Administered  . Moderna SARS-COVID-2 Vaccination 09/04/2019  . Pneumococcal Conjugate-13 05/13/2019    Past Medical History:  Diagnosis Date  . [place] dilatation [place]05[place]  . Dyspnea on exertion   . Frequent unifocal PVCs 02/21/2019  . Glaucoma   . Gonorrhea    [age]s of age; negative for syphillis, herpes and HIV  . Left inguinal hernia   . Pneumonia   . Ventricular premature beats   . Wears dentures     Tobacco History: Social History   Tobacco Use  Smoking Status Former Smoker  . Packs/day: 0.50  . Years: 10.00  . Pack years: 5.00  . Types: Cigarettes  . Quit date: 19780 Years since quitting: 51.1  Smokeless Tobacco Never Used   Counseling given: Not Answered   Outpatient Medications Prior to Visit  Medication Sig Dispense Refill  . dorzolamide (TRUSOPT) 2 % ophthalmic solution Place 1 drop into both eyes 2 (two) times daily.    . Latanoprostene Bunod (VYZULTA) 0.024 % SOLN Apply 1 drop to eye daily.    . polyethylene glycol (MIRALAX / GLYCOLAX) 17 g packet Take 17 g by mouth daily. 14 each 0  . rivaroxaban (XARELTO) 20 MG TABS tablet Take 1 tablet (20 mg total) by mouth daily with supper. Start after you are done with the starter pack. 30 tablet 0  . Rivaroxaban 15 & 20 MG TBPK Follow package directions: Take one 73m14mblet by mouth twice a day. On day 22, switch to one 20mg2mlet once a day. Take with food. 51 each 0  . tamsulosin (FLOMAX) 0.4 MG CAPS capsule Take 0.4 mg  by mouth at bedtime.    . timolol (TIMOPTIC) 0.5 % ophthalmic solution Place 1 drop into both eyes 2 (two) times daily.    . metoprolol tartrate (LOPRESSOR) 50 MG tablet Take 1 tablet (50 mg total) by mouth 2 (two) times daily. 180 tablet 0   No facility-administered medications prior to visit.    Review of Systems  Review of Systems  Constitutional: Positive for fatigue.  Respiratory: Positive for shortness of  breath. Negative for cough, chest tightness and wheezing.   Cardiovascular: Negative for chest pain, palpitations and leg swelling.  Neurological: Positive for weakness.   Physical Exam  BP 120/70 (BP Location: Right Arm, Patient Position: Sitting, Cuff Size: Normal)   Pulse 100   Ht 6' 3" (1.905 m)   Wt 172 lb 3.2 oz (78.1 kg)   SpO2 95%   BMI 21.52 kg/m  Physical Exam Cardiovascular:     Rate and Rhythm: Regular rhythm. Tachycardia present.     Comments: No edema Pulmonary:     Breath sounds: No wheezing or rhonchi.      Lab Results:  CBC    Component Value Date/Time   WBC 6.7 09/02/2019 0652   RBC 3.36 (L) 09/02/2019 0652   HGB 10.7 (L) 09/02/2019 0652   HCT 30.9 (L) 09/02/2019 0652   PLT 252 09/02/2019 0652   MCV 92.0 09/02/2019 0652   MCH 31.8 09/02/2019 0652   MCHC 34.6 09/02/2019 0652   RDW 13.0 09/02/2019 0652   LYMPHSABS 0.9 08/30/2019 0445   MONOABS 1.3 (H) 08/30/2019 0445   EOSABS 0.0 08/30/2019 0445   BASOSABS 0.0 08/30/2019 0445    BMET    Component Value Date/Time   NA 140 08/31/2019 0430   K 4.2 08/31/2019 0430   CL 113 (H) 08/31/2019 0430   CO2 18 (L) 08/31/2019 0430   GLUCOSE 119 (H) 08/31/2019 0430   BUN 24 (H) 08/31/2019 0430   CREATININE 0.84 08/31/2019 0430   CALCIUM 9.2 08/31/2019 0430   GFRNONAA >60 08/31/2019 0430   GFRAA >60 08/31/2019 0430    BNP    Component Value Date/Time   BNP 1,129.6 (H) 08/30/2019 0656    ProBNP No results found for: PROBNP  Imaging: CT Angio Chest PE W and/or Wo Contrast  Result Date: 08/30/2019 CLINICAL DATA:  Syncopal episode while using the bathroom. EXAM: CT ANGIOGRAPHY CHEST WITH CONTRAST TECHNIQUE: Multidetector CT imaging of the chest was performed using the standard protocol during bolus administration of intravenous contrast. Multiplanar CT image reconstructions and MIPs were obtained to evaluate the vascular anatomy. CONTRAST:  174m OMNIPAQUE IOHEXOL 350 MG/ML SOLN COMPARISON:  None.  FINDINGS: Cardiovascular: Massive bilateral pulmonary arterial filling defects with near complete occlusion of the right pulmonary artery, no pulmonary arterial blood flow noted to the right lung. Large thrombus in the left lower lobe pulmonary artery which is partially occlusive. Marked right heart dilatation with contrast refluxing into the hepatic veins and IVC. Elevated RV to LV ratio of 2.16. Aortic atherosclerosis. Cannot assess for dissection given phase of contrast. Mild dilatation of the aortic root at 3.6 cm without aortic aneurysm. No pericardial effusion. Mediastinum/Nodes: No adenopathy. No esophageal wall thickening. No suspicious thyroid nodule. Lungs/Pleura: Triangular subpleural opacities in the right upper and lower lobes likely pulmonary infarct. Diminished pulmonary vessels in the right lung related to pulmonary embolus. Subpleural opacities in the left lower lobe favor atelectasis. No pulmonary mass. No pulmonary edema. Upper Abdomen: Contrast refluxes into the hepatic veins and IVC. No other  acute findings. Musculoskeletal: There are no acute or suspicious osseous abnormalities. Few bone islands in the right proximal humerus. Review of the MIP images confirms the above findings. IMPRESSION: 1. Massive pulmonary emboli with complete occlusion of the right main pulmonary artery and absent pulmonary arterial blood flow to the right lung. Large but partially occlusive filling defects in the left lower lobe pulmonary arteries. Significant right heart strain with RV to LV ratio of greater than 2, right heart dilatation and contrast refluxing into the hepatic veins and IVC. 2. Small pulmonary infarcts in the right upper and lower lobe. Critical Value/emergent results were called by telephone at the time of interpretation on 08/30/2019 at 6:24 am to Dr Addison Lank , who verbally acknowledged these results. Electronically Signed   By: Keith Rake M.D.   On: 08/30/2019 06:30   IR Angiogram  Pulmonary Bilateral Selective  Result Date: 08/31/2019 INDICATION: Sub massive pulmonary embolism. Request made for initiation of bilateral catheter directed pulmonary arterial thrombolysis. EXAM: 1. ULTRASOUND GUIDANCE FOR VENOUS ACCESS X2 2. PULMONARY ARTERIOGRAPHY 3. FLUOROSCOPIC GUIDED PLACEMENT OF BILATERAL PULMONARY ARTERIAL LYTIC INFUSION CATHETERS COMPARISON:  Chest CTA-earlier same day MEDICATIONS: None ANESTHESIA/SEDATION: Moderate (conscious) sedation was employed during this procedure. A total of Versed 2.5 mg and Fentanyl 100 mcg was administered intravenously. Moderate Sedation Time: 33 minutes. The patient's level of consciousness and vital signs were monitored continuously by radiology nursing throughout the procedure under my direct supervision. CONTRAST:  87m OMNIPAQUE IOHEXOL 300 MG/ML  SOLN FLUOROSCOPY TIME:  9 minutes, 18 seconds (1676mGy) COMPLICATIONS: None immediate. TECHNIQUE: Informed written consent was obtained from the patient after a discussion of the risks, benefits and alternatives to treatment. Questions regarding the procedure were encouraged and answered. A timeout was performed prior to the initiation of the procedure. Ultrasound scanning was performed of the right groin and demonstrated near occlusive thrombus within the right common femoral vein. Sonographic evaluation the left groin demonstrates wide patency of the left common femoral vein. As such, the left common femoral vein was selected venous access. The left groin was prepped and draped in the usual sterile fashion, and a sterile drape was applied covering the operative field. Maximum barrier sterile technique with sterile gowns and gloves were used for the procedure. A timeout was performed prior to the initiation of the procedure. Local anesthesia was provided with 1% lidocaine. Under direct ultrasound guidance, the right common femoral vein was accessed with a micro puncture kit ultimately allowing placement of a 6  French, 35 cm vascular sheath. Slightly cranial to this initial access, the right common femoral was again accessed with an additional micropuncture kit ultimately allowing placement of an additional 7 French, 35 cm vascular sheath. Ultrasound images were saved for procedural documentation purposes. With the use of a stiff glidewire, a vertebral catheter was advanced into the main pulmonary artery and a limited central pulmonary arteriogram was performed. Pressure measurements were then obtained from the main pulmonary artery. The vertebral catheter was advanced into the distal branch of the left lower lobe pulmonary artery. Limited contrast injection confirmed appropriate positioning. Over an exchange length Rosen wire, the vertebral catheter was exchanged for a 90/10 cm multi side-hole infusion catheter. Again, with the use of a stiff glidewire, a vertebral catheter was advanced into a distal branch of the right lower lobe pulmonary artery. Limited contrast injection confirmed appropriate positioning. Over an exchange length Rosen wire, the pigtail catheter was exchanged for a 90/15 cm multi side-hole infusion catheter. A postprocedural fluoroscopic  image was obtained to document final catheter positioning. Both vascular sheath were secured at the left groin. The external catheter tubing was secured at the right thigh and the lytic therapy was initiated. The patient tolerated the procedure well without immediate postprocedural complication. FINDINGS: Central pulmonary arteriogram demonstrates significant near occlusive thrombus involving the peripheral aspects of both the right and left main pulmonary arteries with marked dilatation of the central pulmonary arterial system. Additionally, limited right main pulmonary arteriogram demonstrates near occlusive thrombus within the peripheral aspect of the right main pulmonary artery. Acquired pressure measurements: Main  pulmonary artery-61/16; mean-40 (normal: < 25/10)  Following the procedure, both ultrasound assisted infusion catheter tips terminate within the distal aspects of the bilateral lower lobe sub segmental pulmonary arteries. IMPRESSION: 1. Successful fluoroscopic guided initiation of bilateral ultrasound assisted catheter directed pulmonary arterial lysis for sub massive pulmonary embolism and right-sided heart strain. 2. Elevated pressure measurements within the main pulmonary artery compatible with critical pulmonary arterial hypertension. 3. Near occlusive DVT noted within the right common femoral vein. PLAN: Above findings discussed with Dr. Hart Robinsons (critical care) at the time of procedure completion and the decision made to proceed with IVC filter placement for temporary caval interruption purposes at the time of post lysis pressure measurement acquisition. Electronically Signed   By: Sandi Mariscal M.D.   On: 08/31/2019 08:24   IR IVC FILTER PLMT / S&I Burke Keels GUID/MOD SED  Result Date: 08/31/2019 CLINICAL DATA:  Bilateral submassive pulmonary embolism with cor pulmonale. Status post thrombo lytic infusion via each pulmonary artery for 12 hours. Request has also been made to place an IVC filter on completion thrombo lytic therapy given residual DVT in both lower extremities. EXAM: 1. FINAL DAY ARTERIAL THROMBOLYTIC THERAPY PRESSURE MEASUREMENT. 2. IVC VENOGRAM. 3. PERCUTANEOUS IVC FILTER PLACEMENT. ANESTHESIA/SEDATION: 50 mcg IV Fentanyl. The patient was just given fentanyl and not formally sedated. CONTRAST:  9m OMNIPAQUE IOHEXOL 300 MG/ML SOLN, 160mOMNIPAQUE IOHEXOL 300 MG/ML SOLN, 1020mMNIPAQUE IOHEXOL 300 MG/ML SOLN FLUOROSCOPY TIME:  1 minutes and 30 seconds.  146.6 mGy. PROCEDURE: The procedure, risks, benefits, and alternatives were explained to the patient. Questions regarding the procedure were encouraged and answered. The patient understands and consents to the procedure. A time-out was performed prior to initiating the procedure. Pulmonary artery  pressures were directly measured through the pulmonary arterial infusion catheters. The catheters were then removed. Left femoral vein sheaths placed yesterday were prepped and draped. Local anesthesia was provided with 1% Lidocaine. A guidewire was advanced into the inferior vena cava via one of the sheaths. This sheath was removed and the venotomy dilated. A 10 French deployment sheath was advanced over the guidewire. This was utilized to perform IVC venography. The deployment sheath was further positioned in an appropriate location for filter deployment. A Bard Denali IVC filter was then advanced in the sheath. This was then fully deployed in the infrarenal IVC. Final filter position was confirmed with a fluoroscopic spot image. After the procedure the sheath was removed and hemostasis obtained with manual compression. COMPLICATIONS: None. FINDINGS: Measure pulmonary artery pressure is 55/20, mean 28 mm Hg compared to 61/16, 40 mm Hg prior to lytic therapy. IVC venography demonstrates a normal caliber IVC with no evidence of thrombus. Renal veins are identified bilaterally. The IVC filter was successfully positioned below the level of the renal veins and is appropriately oriented. This IVC filter has both permanent and retrievable indications. IMPRESSION: 1. Decrease in measured pulmonary artery pressure after 12 hours of bilateral  pulmonary arterial thrombolytic therapy. 2. Placement of percutaneous IVC filter in infrarenal IVC. IVC venogram shows no evidence of IVC thrombus and normal caliber of the inferior vena cava. This filter does have both permanent and retrievable indications. PLAN: This IVC filter is potentially retrievable. The patient will be assessed for filter retrieval by Interventional Radiology in approximately 8-12 weeks. Further recommendations regarding filter retrieval, continued surveillance or declaration of device permanence, will be made at that time. Electronically Signed   By: Aletta Edouard M.D.   On: 08/31/2019 14:25   IR US Guide Vasc Access Left  Result Date: 08/31/2019 INDICATION: Sub massive pulmonary embolism. Request made for initiation of bilateral catheter directed pulmonary arterial thrombolysis. EXAM: 1. ULTRASOUND GUIDANCE FOR VENOUS ACCESS X2 2. PULMONARY ARTERIOGRAPHY 3. FLUOROSCOPIC GUIDED PLACEMENT OF BILATERAL PULMONARY ARTERIAL LYTIC INFUSION CATHETERS COMPARISON:  Chest CTA-earlier same day MEDICATIONS: None ANESTHESIA/SEDATION: Moderate (conscious) sedation was employed during this procedure. A total of Versed 2.5 mg and Fentanyl 100 mcg was administered intravenously. Moderate Sedation Time: 33 minutes. The patient's level of consciousness and vital signs were monitored continuously by radiology nursing throughout the procedure under my direct supervision. CONTRAST:  23m OMNIPAQUE IOHEXOL 300 MG/ML  SOLN FLUOROSCOPY TIME:  9 minutes, 18 seconds (1127mGy) COMPLICATIONS: None immediate. TECHNIQUE: Informed written consent was obtained from the patient after a discussion of the risks, benefits and alternatives to treatment. Questions regarding the procedure were encouraged and answered. A timeout was performed prior to the initiation of the procedure. Ultrasound scanning was performed of the right groin and demonstrated near occlusive thrombus within the right common femoral vein. Sonographic evaluation the left groin demonstrates wide patency of the left common femoral vein. As such, the left common femoral vein was selected venous access. The left groin was prepped and draped in the usual sterile fashion, and a sterile drape was applied covering the operative field. Maximum barrier sterile technique with sterile gowns and gloves were used for the procedure. A timeout was performed prior to the initiation of the procedure. Local anesthesia was provided with 1% lidocaine. Under direct ultrasound guidance, the right common femoral vein was accessed with a micro puncture  kit ultimately allowing placement of a 6 French, 35 cm vascular sheath. Slightly cranial to this initial access, the right common femoral was again accessed with an additional micropuncture kit ultimately allowing placement of an additional 7 French, 35 cm vascular sheath. Ultrasound images were saved for procedural documentation purposes. With the use of a stiff glidewire, a vertebral catheter was advanced into the main pulmonary artery and a limited central pulmonary arteriogram was performed. Pressure measurements were then obtained from the main pulmonary artery. The vertebral catheter was advanced into the distal branch of the left lower lobe pulmonary artery. Limited contrast injection confirmed appropriate positioning. Over an exchange length Rosen wire, the vertebral catheter was exchanged for a 90/10 cm multi side-hole infusion catheter. Again, with the use of a stiff glidewire, a vertebral catheter was advanced into a distal branch of the right lower lobe pulmonary artery. Limited contrast injection confirmed appropriate positioning. Over an exchange length Rosen wire, the pigtail catheter was exchanged for a 90/15 cm multi side-hole infusion catheter. A postprocedural fluoroscopic image was obtained to document final catheter positioning. Both vascular sheath were secured at the left groin. The external catheter tubing was secured at the right thigh and the lytic therapy was initiated. The patient tolerated the procedure well without immediate postprocedural complication. FINDINGS: Central pulmonary arteriogram demonstrates significant  near occlusive thrombus involving the peripheral aspects of both the right and left main pulmonary arteries with marked dilatation of the central pulmonary arterial system. Additionally, limited right main pulmonary arteriogram demonstrates near occlusive thrombus within the peripheral aspect of the right main pulmonary artery. Acquired pressure measurements: Main   pulmonary artery-61/16; mean-40 (normal: < 25/10) Following the procedure, both ultrasound assisted infusion catheter tips terminate within the distal aspects of the bilateral lower lobe sub segmental pulmonary arteries. IMPRESSION: 1. Successful fluoroscopic guided initiation of bilateral ultrasound assisted catheter directed pulmonary arterial lysis for sub massive pulmonary embolism and right-sided heart strain. 2. Elevated pressure measurements within the main pulmonary artery compatible with critical pulmonary arterial hypertension. 3. Near occlusive DVT noted within the right common femoral vein. PLAN: Above findings discussed with Dr. Hart Robinsons (critical care) at the time of procedure completion and the decision made to proceed with IVC filter placement for temporary caval interruption purposes at the time of post lysis pressure measurement acquisition. Electronically Signed   By: Sandi Mariscal M.D.   On: 08/31/2019 08:24   IR US Guide Vasc Access Left  Result Date: 08/31/2019 INDICATION: Sub massive pulmonary embolism. Request made for initiation of bilateral catheter directed pulmonary arterial thrombolysis. EXAM: 1. ULTRASOUND GUIDANCE FOR VENOUS ACCESS X2 2. PULMONARY ARTERIOGRAPHY 3. FLUOROSCOPIC GUIDED PLACEMENT OF BILATERAL PULMONARY ARTERIAL LYTIC INFUSION CATHETERS COMPARISON:  Chest CTA-earlier same day MEDICATIONS: None ANESTHESIA/SEDATION: Moderate (conscious) sedation was employed during this procedure. A total of Versed 2.5 mg and Fentanyl 100 mcg was administered intravenously. Moderate Sedation Time: 33 minutes. The patient's level of consciousness and vital signs were monitored continuously by radiology nursing throughout the procedure under my direct supervision. CONTRAST:  22m OMNIPAQUE IOHEXOL 300 MG/ML  SOLN FLUOROSCOPY TIME:  9 minutes, 18 seconds (1294mGy) COMPLICATIONS: None immediate. TECHNIQUE: Informed written consent was obtained from the patient after a discussion of the risks,  benefits and alternatives to treatment. Questions regarding the procedure were encouraged and answered. A timeout was performed prior to the initiation of the procedure. Ultrasound scanning was performed of the right groin and demonstrated near occlusive thrombus within the right common femoral vein. Sonographic evaluation the left groin demonstrates wide patency of the left common femoral vein. As such, the left common femoral vein was selected venous access. The left groin was prepped and draped in the usual sterile fashion, and a sterile drape was applied covering the operative field. Maximum barrier sterile technique with sterile gowns and gloves were used for the procedure. A timeout was performed prior to the initiation of the procedure. Local anesthesia was provided with 1% lidocaine. Under direct ultrasound guidance, the right common femoral vein was accessed with a micro puncture kit ultimately allowing placement of a 6 French, 35 cm vascular sheath. Slightly cranial to this initial access, the right common femoral was again accessed with an additional micropuncture kit ultimately allowing placement of an additional 7 French, 35 cm vascular sheath. Ultrasound images were saved for procedural documentation purposes. With the use of a stiff glidewire, a vertebral catheter was advanced into the main pulmonary artery and a limited central pulmonary arteriogram was performed. Pressure measurements were then obtained from the main pulmonary artery. The vertebral catheter was advanced into the distal branch of the left lower lobe pulmonary artery. Limited contrast injection confirmed appropriate positioning. Over an exchange length Rosen wire, the vertebral catheter was exchanged for a 90/10 cm multi side-hole infusion catheter. Again, with the use of a stiff glidewire, a vertebral catheter  was advanced into a distal branch of the right lower lobe pulmonary artery. Limited contrast injection confirmed appropriate  positioning. Over an exchange length Rosen wire, the pigtail catheter was exchanged for a 90/15 cm multi side-hole infusion catheter. A postprocedural fluoroscopic image was obtained to document final catheter positioning. Both vascular sheath were secured at the left groin. The external catheter tubing was secured at the right thigh and the lytic therapy was initiated. The patient tolerated the procedure well without immediate postprocedural complication. FINDINGS: Central pulmonary arteriogram demonstrates significant near occlusive thrombus involving the peripheral aspects of both the right and left main pulmonary arteries with marked dilatation of the central pulmonary arterial system. Additionally, limited right main pulmonary arteriogram demonstrates near occlusive thrombus within the peripheral aspect of the right main pulmonary artery. Acquired pressure measurements: Main  pulmonary artery-61/16; mean-40 (normal: < 25/10) Following the procedure, both ultrasound assisted infusion catheter tips terminate within the distal aspects of the bilateral lower lobe sub segmental pulmonary arteries. IMPRESSION: 1. Successful fluoroscopic guided initiation of bilateral ultrasound assisted catheter directed pulmonary arterial lysis for sub massive pulmonary embolism and right-sided heart strain. 2. Elevated pressure measurements within the main pulmonary artery compatible with critical pulmonary arterial hypertension. 3. Near occlusive DVT noted within the right common femoral vein. PLAN: Above findings discussed with Dr. Hart Robinsons (critical care) at the time of procedure completion and the decision made to proceed with IVC filter placement for temporary caval interruption purposes at the time of post lysis pressure measurement acquisition. Electronically Signed   By: Sandi Mariscal M.D.   On: 08/31/2019 08:24   ECHOCARDIOGRAM COMPLETE  Result Date: 08/30/2019    ECHOCARDIOGRAM REPORT   Patient Name:   Joe Cantu Date  of Exam: 08/30/2019 Medical Rec #:  [id number]     Height:       75.0 in Accession #:    [id number]    Weight:       172.0 lb Date of Birth:  [birth year]     BSA:          2.06 m Patient Age:    48 years      BP:           122/87 mmHg Patient Gender: M             HR:           98 bpm. Exam Location:  Inpatient Procedure: 2D Echo, Cardiac Doppler and Color Doppler STAT ECHO Indications:    R55 Syncope  History:        Patient has no prior history of Echocardiogram examinations.                 Aortic Rood Dilatation.  Sonographer:    Jonelle Sidle Dance Referring Phys: [id number] [place]  1. Severe RA/RV dilation with reduced RV function and severe pulmonary hypertension. LV hyperdynamic and borderline small cavity/underfilled. No hemodynamically significant valve disease.  2. Left ventricular ejection fraction, by estimation, is 65 to 70%. The left ventricle has hyperdynamic function. The left ventricle has no regional wall motion abnormalities. There is moderate concentric left ventricular hypertrophy. Left ventricular diastolic function could not be evaluated. There is the interventricular septum is flattened in systole and diastole, consistent with right ventricular pressure and volume overload.  3. Right ventricular systolic function is moderately reduced. The right ventricular size is severely enlarged. There is severely elevated pulmonary artery systolic pressure. The estimated right ventricular systolic pressure is 86.7 mmHg.  4.  Right atrial size was severely dilated.  5. The mitral valve is normal in structure and function. Trivial mitral valve regurgitation. No evidence of mitral stenosis.  6. Tricuspid valve regurgitation is moderate.  7. The aortic valve is tricuspid. Aortic valve regurgitation is trivial. No aortic stenosis is present.  8. Aortic dilatation noted. There is mild dilatation of the ascending aorta measuring 37 mm.  9. Mildly dilated pulmonary artery. 10. The inferior vena  cava is dilated in size with <50% respiratory variability, suggesting right atrial pressure of 15 mmHg. 11. Cannot exclude IAS shunt. Comparison(s): No prior Echocardiogram. FINDINGS  Left Ventricle: Left ventricular ejection fraction, by estimation, is 65 to 70%. The left ventricle has hyperdynamic function. The left ventricle has no regional wall motion abnormalities. The left ventricular internal cavity size was small. There is moderate concentric left ventricular hypertrophy. The interventricular septum is flattened in systole and diastole, consistent with right ventricular pressure and volume overload. Left ventricular diastolic function could not be evaluated. Right Ventricle: The right ventricular size is severely enlarged. Right vetricular wall thickness was not assessed. Right ventricular systolic function is moderately reduced. There is severely elevated pulmonary artery systolic pressure. The tricuspid regurgitant velocity is 3.91 m/s, and with an assumed right atrial pressure of 15 mmHg, the estimated right ventricular systolic pressure is 88.2 mmHg. Left Atrium: Left atrial size was normal in size. Right Atrium: Right atrial size was severely dilated. Pericardium: There is no evidence of pericardial effusion. Mitral Valve: The mitral valve is normal in structure and function. Trivial mitral valve regurgitation. No evidence of mitral valve stenosis. Tricuspid Valve: The tricuspid valve is normal in structure. Tricuspid valve regurgitation is moderate . No evidence of tricuspid stenosis. Aortic Valve: The aortic valve is tricuspid. Aortic valve regurgitation is trivial. No aortic stenosis is present. Pulmonic Valve: The pulmonic valve was grossly normal. Pulmonic valve regurgitation is mild. No evidence of pulmonic stenosis. Aorta: Aortic dilatation noted. There is mild dilatation of the ascending aorta measuring 37 mm. Pulmonary Artery: The pulmonary artery is mildly dilated. Venous: The inferior vena  cava is dilated in size with less than 50% respiratory variability, suggesting right atrial pressure of 15 mmHg. IAS/Shunts: Cannot exclude IAS shunt. Additional Comments: There is a small pleural effusion in both left and right lateral regions.  LEFT VENTRICLE PLAX 2D LVIDd:         3.33 cm LVIDs:         2.20 cm LV PW:         1.75 cm LV IVS:        2.02 cm LVOT diam:     2.40 cm LV SV:         72.61 ml LV SV Index:   14.28 LVOT Area:     4.52 cm  RIGHT VENTRICLE          IVC RV Basal diam:  4.20 cm  IVC diam: 2.20 cm RV Mid diam:    3.00 cm TAPSE (M-mode): 1.4 cm LEFT ATRIUM             Index       RIGHT ATRIUM           Index LA diam:        3.40 cm 1.65 cm/m  RA Area:     39.50 cm LA Vol (A2C):   42.1 ml 20.46 ml/m RA Volume:   182.50 ml 88.67 ml/m LA Vol (A4C):   43.0 ml 20.89 ml/m LA Biplane Vol:  42.4 ml 20.60 ml/m  AORTIC VALVE LVOT Vmax:   102.40 cm/s LVOT Vmean:  57.500 cm/s LVOT VTI:    0.160 m  AORTA Ao Root diam: 4.20 cm Ao Asc diam:  3.70 cm MV A velocity: 78.45 cm/s  TRICUSPID VALVE                            TR Peak grad:   61.2 mmHg                            TR Vmax:        391.00 cm/s                             SHUNTS                            Systemic VTI:  0.16 m                            Systemic Diam: 2.40 cm Buford Dresser MD Electronically signed by Buford Dresser MD Signature Date/Time: 08/30/2019/12:48:37 PM    Final    VAS [place] LOWER EXTREMITY VENOUS (DVT)  Result Date: 08/30/2019  Lower Venous DVTStudy Indications: Pulmonary embolism, and SOB.  Comparison Study: No prior exam. Performing Technologist: Baldwin Crown ARDMS, RVT  Examination Guidelines: A complete evaluation includes B-mode imaging, spectral Doppler, color Doppler, and power Doppler as needed of all accessible portions of each vessel. Bilateral testing is considered an integral part of a complete examination. Limited examinations for reoccurring indications may be performed as noted. The reflux  portion of the exam is performed with the patient in reverse Trendelenburg.  +---------+---------------+---------+-----------+----------+---------------+ RIGHT    CompressibilityPhasicitySpontaneityPropertiesThrombus Aging  +---------+---------------+---------+-----------+----------+---------------+ CFV      Partial        Yes      Yes                                  +---------+---------------+---------+-----------+----------+---------------+ SFJ      None                                                         +---------+---------------+---------+-----------+----------+---------------+ FV Prox  Full                                                         +---------+---------------+---------+-----------+----------+---------------+ FV Mid   Full                                                         +---------+---------------+---------+-----------+----------+---------------+ FV DistalFull                                                         +---------+---------------+---------+-----------+----------+---------------+  PFV      Full                                                         +---------+---------------+---------+-----------+----------+---------------+ POP      Full           Yes      Yes                                  +---------+---------------+---------+-----------+----------+---------------+ PTV      Full                                         seen with color +---------+---------------+---------+-----------+----------+---------------+ PERO     Full                                         seen with color +---------+---------------+---------+-----------+----------+---------------+ EIV      Full           Yes      Yes                                  +---------+---------------+---------+-----------+----------+---------------+   +---------+---------------+---------+-----------+----------+---------------+ LEFT      CompressibilityPhasicitySpontaneityPropertiesThrombus Aging  +---------+---------------+---------+-----------+----------+---------------+ CFV      None           No       No                                   +---------+---------------+---------+-----------+----------+---------------+ SFJ      Full                                                         +---------+---------------+---------+-----------+----------+---------------+ FV Prox  Full                                                         +---------+---------------+---------+-----------+----------+---------------+ FV Mid   Full                                                         +---------+---------------+---------+-----------+----------+---------------+ FV DistalNone                                                         +---------+---------------+---------+-----------+----------+---------------+  PFV      Full                                                         +---------+---------------+---------+-----------+----------+---------------+ POP      None           No       No                                   +---------+---------------+---------+-----------+----------+---------------+ PTV      Full                                         seen with color +---------+---------------+---------+-----------+----------+---------------+ PERO     Full                                         seen with color +---------+---------------+---------+-----------+----------+---------------+ EIV      Full                                                         +---------+---------------+---------+-----------+----------+---------------+ Poorly visusalized bilteral calf veins, visualized with color flow.    Summary: RIGHT: - Findings consistent with acute deep vein thrombosis involving the SF junction, and right common femoral vein. - Findings consistent with acute superficial vein thrombosis  involving the right great saphenous vein.  LEFT: - Findings consistent with acute deep vein thrombosis involving the left common femoral vein, left femoral vein, and left popliteal vein.  *See table(s) above for measurements and observations. Electronically signed by Harold Barban MD on 08/30/2019 at 6:38:23 PM.    Final    IR INFUSION THROMBOL ARTERIAL INITIAL (MS)  Result Date: 08/31/2019 INDICATION: Sub massive pulmonary embolism. Request made for initiation of bilateral catheter directed pulmonary arterial thrombolysis. EXAM: 1. ULTRASOUND GUIDANCE FOR VENOUS ACCESS X2 2. PULMONARY ARTERIOGRAPHY 3. FLUOROSCOPIC GUIDED PLACEMENT OF BILATERAL PULMONARY ARTERIAL LYTIC INFUSION CATHETERS COMPARISON:  Chest CTA-earlier same day MEDICATIONS: None ANESTHESIA/SEDATION: Moderate (conscious) sedation was employed during this procedure. A total of Versed 2.5 mg and Fentanyl 100 mcg was administered intravenously. Moderate Sedation Time: 33 minutes. The patient's level of consciousness and vital signs were monitored continuously by radiology nursing throughout the procedure under my direct supervision. CONTRAST:  61m OMNIPAQUE IOHEXOL 300 MG/ML  SOLN FLUOROSCOPY TIME:  9 minutes, 18 seconds (1056mGy) COMPLICATIONS: None immediate. TECHNIQUE: Informed written consent was obtained from the patient after a discussion of the risks, benefits and alternatives to treatment. Questions regarding the procedure were encouraged and answered. A timeout was performed prior to the initiation of the procedure. Ultrasound scanning was performed of the right groin and demonstrated near occlusive thrombus within the right common femoral vein. Sonographic evaluation the left groin demonstrates wide patency of the left common femoral vein. As such, the left common femoral vein was selected venous access. The left groin was prepped and draped in  the usual sterile fashion, and a sterile drape was applied covering the operative field. Maximum  barrier sterile technique with sterile gowns and gloves were used for the procedure. A timeout was performed prior to the initiation of the procedure. Local anesthesia was provided with 1% lidocaine. Under direct ultrasound guidance, the right common femoral vein was accessed with a micro puncture kit ultimately allowing placement of a 6 French, 35 cm vascular sheath. Slightly cranial to this initial access, the right common femoral was again accessed with an additional micropuncture kit ultimately allowing placement of an additional 7 French, 35 cm vascular sheath. Ultrasound images were saved for procedural documentation purposes. With the use of a stiff glidewire, a vertebral catheter was advanced into the main pulmonary artery and a limited central pulmonary arteriogram was performed. Pressure measurements were then obtained from the main pulmonary artery. The vertebral catheter was advanced into the distal branch of the left lower lobe pulmonary artery. Limited contrast injection confirmed appropriate positioning. Over an exchange length Rosen wire, the vertebral catheter was exchanged for a 90/10 cm multi side-hole infusion catheter. Again, with the use of a stiff glidewire, a vertebral catheter was advanced into a distal branch of the right lower lobe pulmonary artery. Limited contrast injection confirmed appropriate positioning. Over an exchange length Rosen wire, the pigtail catheter was exchanged for a 90/15 cm multi side-hole infusion catheter. A postprocedural fluoroscopic image was obtained to document final catheter positioning. Both vascular sheath were secured at the left groin. The external catheter tubing was secured at the right thigh and the lytic therapy was initiated. The patient tolerated the procedure well without immediate postprocedural complication. FINDINGS: Central pulmonary arteriogram demonstrates significant near occlusive thrombus involving the peripheral aspects of both the right  and left main pulmonary arteries with marked dilatation of the central pulmonary arterial system. Additionally, limited right main pulmonary arteriogram demonstrates near occlusive thrombus within the peripheral aspect of the right main pulmonary artery. Acquired pressure measurements: Main  pulmonary artery-61/16; mean-40 (normal: < 25/10) Following the procedure, both ultrasound assisted infusion catheter tips terminate within the distal aspects of the bilateral lower lobe sub segmental pulmonary arteries. IMPRESSION: 1. Successful fluoroscopic guided initiation of bilateral ultrasound assisted catheter directed pulmonary arterial lysis for sub massive pulmonary embolism and right-sided heart strain. 2. Elevated pressure measurements within the main pulmonary artery compatible with critical pulmonary arterial hypertension. 3. Near occlusive DVT noted within the right common femoral vein. PLAN: Above findings discussed with Dr. Hart Robinsons (critical care) at the time of procedure completion and the decision made to proceed with IVC filter placement for temporary caval interruption purposes at the time of post lysis pressure measurement acquisition. Electronically Signed   By: Sandi Mariscal M.D.   On: 08/31/2019 08:24   IR INFUSION THROMBOL ARTERIAL INITIAL (MS)  Result Date: 08/31/2019 INDICATION: Sub massive pulmonary embolism. Request made for initiation of bilateral catheter directed pulmonary arterial thrombolysis. EXAM: 1. ULTRASOUND GUIDANCE FOR VENOUS ACCESS X2 2. PULMONARY ARTERIOGRAPHY 3. FLUOROSCOPIC GUIDED PLACEMENT OF BILATERAL PULMONARY ARTERIAL LYTIC INFUSION CATHETERS COMPARISON:  Chest CTA-earlier same day MEDICATIONS: None ANESTHESIA/SEDATION: Moderate (conscious) sedation was employed during this procedure. A total of Versed 2.5 mg and Fentanyl 100 mcg was administered intravenously. Moderate Sedation Time: 33 minutes. The patient's level of consciousness and vital signs were monitored continuously  by radiology nursing throughout the procedure under my direct supervision. CONTRAST:  48m OMNIPAQUE IOHEXOL 300 MG/ML  SOLN FLUOROSCOPY TIME:  9 minutes, 18 seconds (1782mGy) COMPLICATIONS: None immediate.  TECHNIQUE: Informed written consent was obtained from the patient after a discussion of the risks, benefits and alternatives to treatment. Questions regarding the procedure were encouraged and answered. A timeout was performed prior to the initiation of the procedure. Ultrasound scanning was performed of the right groin and demonstrated near occlusive thrombus within the right common femoral vein. Sonographic evaluation the left groin demonstrates wide patency of the left common femoral vein. As such, the left common femoral vein was selected venous access. The left groin was prepped and draped in the usual sterile fashion, and a sterile drape was applied covering the operative field. Maximum barrier sterile technique with sterile gowns and gloves were used for the procedure. A timeout was performed prior to the initiation of the procedure. Local anesthesia was provided with 1% lidocaine. Under direct ultrasound guidance, the right common femoral vein was accessed with a micro puncture kit ultimately allowing placement of a 6 French, 35 cm vascular sheath. Slightly cranial to this initial access, the right common femoral was again accessed with an additional micropuncture kit ultimately allowing placement of an additional 7 French, 35 cm vascular sheath. Ultrasound images were saved for procedural documentation purposes. With the use of a stiff glidewire, a vertebral catheter was advanced into the main pulmonary artery and a limited central pulmonary arteriogram was performed. Pressure measurements were then obtained from the main pulmonary artery. The vertebral catheter was advanced into the distal branch of the left lower lobe pulmonary artery. Limited contrast injection confirmed appropriate positioning. Over  an exchange length Rosen wire, the vertebral catheter was exchanged for a 90/10 cm multi side-hole infusion catheter. Again, with the use of a stiff glidewire, a vertebral catheter was advanced into a distal branch of the right lower lobe pulmonary artery. Limited contrast injection confirmed appropriate positioning. Over an exchange length Rosen wire, the pigtail catheter was exchanged for a 90/15 cm multi side-hole infusion catheter. A postprocedural fluoroscopic image was obtained to document final catheter positioning. Both vascular sheath were secured at the left groin. The external catheter tubing was secured at the right thigh and the lytic therapy was initiated. The patient tolerated the procedure well without immediate postprocedural complication. FINDINGS: Central pulmonary arteriogram demonstrates significant near occlusive thrombus involving the peripheral aspects of both the right and left main pulmonary arteries with marked dilatation of the central pulmonary arterial system. Additionally, limited right main pulmonary arteriogram demonstrates near occlusive thrombus within the peripheral aspect of the right main pulmonary artery. Acquired pressure measurements: Main  pulmonary artery-61/16; mean-40 (normal: < 25/10) Following the procedure, both ultrasound assisted infusion catheter tips terminate within the distal aspects of the bilateral lower lobe sub segmental pulmonary arteries. IMPRESSION: 1. Successful fluoroscopic guided initiation of bilateral ultrasound assisted catheter directed pulmonary arterial lysis for sub massive pulmonary embolism and right-sided heart strain. 2. Elevated pressure measurements within the main pulmonary artery compatible with critical pulmonary arterial hypertension. 3. Near occlusive DVT noted within the right common femoral vein. PLAN: Above findings discussed with Dr. Hart Robinsons (critical care) at the time of procedure completion and the decision made to proceed with  IVC filter placement for temporary caval interruption purposes at the time of post lysis pressure measurement acquisition. Electronically Signed   By: Sandi Mariscal M.D.   On: 08/31/2019 08:24   IR THROMB F/U EVAL ART/VEN FINAL DAY (MS)  Result Date: 08/31/2019 CLINICAL DATA:  Bilateral submassive pulmonary embolism with cor pulmonale. Status post thrombo lytic infusion via each pulmonary artery for 12 hours. Request  has also been made to place an IVC filter on completion thrombo lytic therapy given residual DVT in both lower extremities. EXAM: 1. FINAL DAY ARTERIAL THROMBOLYTIC THERAPY PRESSURE MEASUREMENT. 2. IVC VENOGRAM. 3. PERCUTANEOUS IVC FILTER PLACEMENT. ANESTHESIA/SEDATION: 50 mcg IV Fentanyl. The patient was just given fentanyl and not formally sedated. CONTRAST:  73m OMNIPAQUE IOHEXOL 300 MG/ML SOLN, 132mOMNIPAQUE IOHEXOL 300 MG/ML SOLN, 1075mMNIPAQUE IOHEXOL 300 MG/ML SOLN FLUOROSCOPY TIME:  1 minutes and 30 seconds.  146.6 mGy. PROCEDURE: The procedure, risks, benefits, and alternatives were explained to the patient. Questions regarding the procedure were encouraged and answered. The patient understands and consents to the procedure. A time-out was performed prior to initiating the procedure. Pulmonary artery pressures were directly measured through the pulmonary arterial infusion catheters. The catheters were then removed. Left femoral vein sheaths placed yesterday were prepped and draped. Local anesthesia was provided with 1% Lidocaine. A guidewire was advanced into the inferior vena cava via one of the sheaths. This sheath was removed and the venotomy dilated. A 10 French deployment sheath was advanced over the guidewire. This was utilized to perform IVC venography. The deployment sheath was further positioned in an appropriate location for filter deployment. A Bard Denali IVC filter was then advanced in the sheath. This was then fully deployed in the infrarenal IVC. Final filter position was  confirmed with a fluoroscopic spot image. After the procedure the sheath was removed and hemostasis obtained with manual compression. COMPLICATIONS: None. FINDINGS: Measure pulmonary artery pressure is 55/20, mean 28 mm Hg compared to 61/16, 40 mm Hg prior to lytic therapy. IVC venography demonstrates a normal caliber IVC with no evidence of thrombus. Renal veins are identified bilaterally. The IVC filter was successfully positioned below the level of the renal veins and is appropriately oriented. This IVC filter has both permanent and retrievable indications. IMPRESSION: 1. Decrease in measured pulmonary artery pressure after 12 hours of bilateral pulmonary arterial thrombolytic therapy. 2. Placement of percutaneous IVC filter in infrarenal IVC. IVC venogram shows no evidence of IVC thrombus and normal caliber of the inferior vena cava. This filter does have both permanent and retrievable indications. PLAN: This IVC filter is potentially retrievable. The patient will be assessed for filter retrieval by Interventional Radiology in approximately 8-12 weeks. Further recommendations regarding filter retrieval, continued surveillance or declaration of device permanence, will be made at that time. Electronically Signed   By: GleAletta EdouardD.   On: 08/31/2019 14:25     Assessment & Plan:   Acute massive pulmonary embolism ([facility] 08/30/19 after syncopal episode, CTA showed bilateral PE with right heart straing. Received catheter directed TPA on 2/17, IVC filter placed 2/18  - Patient continues to have some shortness of breath, fatigue and weakness - O2 stayed at 100% RA on ambulatory walk today - Continue Xarelto 37m32mily (life long therapy) - Reviewed patient education on bleeding precautions with blood thinners  - Pulmonary rehab re: dyspnea; PE (if no availability refer physical therapy for deconditioning) - Orders: Echocardiogram in 3 months (May 12th) - Follow-up: 3 months with Dr.  MannVaughan Brownerer echocardiogram   Frequent unifocal PVCs - Referral: Dr. GanjEinar Giph Cardiology- right heart strain; PVCs - RX: metoprolol 50mg37mce daily (further refills need to come from PCP or cardiology)  DVT (deep venous thrombosis) (HCC) Brad[place]ontinue Xarelto 37mg 31my (life long therapy) - IVC filter due to be removed in late May 2021    ElizabMartyn Ehrich/26/2021

## 2019-09-08 NOTE — Telephone Encounter (Signed)
Spoke with the pt  He states breathing not improving since hosp d/c and would like sooner appt with PM  No appt available with PM  He states his breathing is no worse since hosp d/c, just not improving  He c/o occ cough with white sputum and would like to discuss starting nebs for this  Denies f/c/s, body aches, sick contacts, recent travel  HFU today with Beth at 1:30 pm

## 2019-09-08 NOTE — Assessment & Plan Note (Signed)
-   Referral: Dr. Jacinto Halim with Cardiology- right heart strain; PVCs - RX: metoprolol 50mg  twice daily (further refills need to come from PCP or cardiology)

## 2019-09-08 NOTE — Assessment & Plan Note (Signed)
-   Continue Xarelto 20mg  daily (life long therapy) - IVC filter due to be removed in late May 2021

## 2019-09-08 NOTE — Telephone Encounter (Signed)
Pt's sister called back to have a call returned to pt.  [phone]

## 2019-09-08 NOTE — Assessment & Plan Note (Addendum)
-   Hospitalized 08/30/19 after syncopal episode, CTA showed bilateral PE with right heart straing. Received catheter directed TPA on 2/17, IVC filter placed 2/18  - Patient continues to have some shortness of breath, fatigue and weakness - O2 stayed at 100% RA on ambulatory walk today - Continue Xarelto 20mg  daily (life long therapy) - Reviewed patient education on bleeding precautions with blood thinners  - Pulmonary rehab re: dyspnea; PE (if no availability refer physical therapy for deconditioning) - Orders: Echocardiogram in 3 months (May 12th) - Follow-up: 3 months with Dr. 12-09-1969 after echocardiogram

## 2019-09-08 NOTE — Addendum Note (Signed)
Addended by: Benjie Karvonen R on: 09/08/2019 04:59 PM   Modules accepted: Orders

## 2019-09-08 NOTE — Addendum Note (Signed)
Addended by: Benjie Karvonen R on: 09/08/2019 02:33 PM   Modules accepted: Orders

## 2019-09-10 ENCOUNTER — Inpatient Hospital Stay (HOSPITAL_COMMUNITY)
Admission: EM | Admit: 2019-09-10 | Discharge: 2019-09-18 | DRG: 813 | Disposition: A | Discharge status: Home Health | Payer: BC Managed Care – PPO | Attending: Family Medicine | Admitting: Family Medicine

## 2019-09-10 ENCOUNTER — Encounter (HOSPITAL_COMMUNITY): Payer: Self-pay

## 2019-09-10 ENCOUNTER — Other Ambulatory Visit: Payer: Self-pay

## 2019-09-10 ENCOUNTER — Emergency Department (HOSPITAL_COMMUNITY): Payer: BC Managed Care – PPO

## 2019-09-10 ENCOUNTER — Inpatient Hospital Stay (HOSPITAL_COMMUNITY): Payer: BC Managed Care – PPO

## 2019-09-10 DIAGNOSIS — T45515A Adverse effect of anticoagulants, initial encounter: Secondary | ICD-10-CM | POA: Diagnosis present

## 2019-09-10 DIAGNOSIS — I493 Ventricular premature depolarization: Secondary | ICD-10-CM | POA: Diagnosis present

## 2019-09-10 DIAGNOSIS — I77810 Thoracic aortic ectasia: Secondary | ICD-10-CM | POA: Diagnosis present

## 2019-09-10 DIAGNOSIS — R7881 Bacteremia: Secondary | ICD-10-CM | POA: Diagnosis present

## 2019-09-10 DIAGNOSIS — D6851 Activated protein C resistance: Secondary | ICD-10-CM | POA: Diagnosis present

## 2019-09-10 DIAGNOSIS — Z7901 Long term (current) use of anticoagulants: Secondary | ICD-10-CM

## 2019-09-10 DIAGNOSIS — I82409 Acute embolism and thrombosis of unspecified deep veins of unspecified lower extremity: Secondary | ICD-10-CM | POA: Diagnosis present

## 2019-09-10 DIAGNOSIS — I82402 Acute embolism and thrombosis of unspecified deep veins of left lower extremity: Secondary | ICD-10-CM | POA: Diagnosis not present

## 2019-09-10 DIAGNOSIS — I2699 Other pulmonary embolism without acute cor pulmonale: Secondary | ICD-10-CM | POA: Diagnosis present

## 2019-09-10 DIAGNOSIS — R7401 Elevation of levels of liver transaminase levels: Secondary | ICD-10-CM | POA: Diagnosis present

## 2019-09-10 DIAGNOSIS — J690 Pneumonitis due to inhalation of food and vomit: Secondary | ICD-10-CM | POA: Diagnosis present

## 2019-09-10 DIAGNOSIS — H409 Unspecified glaucoma: Secondary | ICD-10-CM | POA: Diagnosis present

## 2019-09-10 DIAGNOSIS — Z87891 Personal history of nicotine dependence: Secondary | ICD-10-CM

## 2019-09-10 DIAGNOSIS — E876 Hypokalemia: Secondary | ICD-10-CM | POA: Diagnosis present

## 2019-09-10 DIAGNOSIS — Z86718 Personal history of other venous thrombosis and embolism: Secondary | ICD-10-CM

## 2019-09-10 DIAGNOSIS — B9620 Unspecified Escherichia coli [E. coli] as the cause of diseases classified elsewhere: Secondary | ICD-10-CM | POA: Diagnosis present

## 2019-09-10 DIAGNOSIS — Z86711 Personal history of pulmonary embolism: Secondary | ICD-10-CM | POA: Diagnosis not present

## 2019-09-10 DIAGNOSIS — D6832 Hemorrhagic disorder due to extrinsic circulating anticoagulants: Secondary | ICD-10-CM | POA: Diagnosis present

## 2019-09-10 DIAGNOSIS — Z20822 Contact with and (suspected) exposure to covid-19: Secondary | ICD-10-CM | POA: Diagnosis present

## 2019-09-10 DIAGNOSIS — Z800 Family history of malignant neoplasm of digestive organs: Secondary | ICD-10-CM | POA: Diagnosis not present

## 2019-09-10 DIAGNOSIS — I959 Hypotension, unspecified: Secondary | ICD-10-CM | POA: Diagnosis present

## 2019-09-10 DIAGNOSIS — Z833 Family history of diabetes mellitus: Secondary | ICD-10-CM

## 2019-09-10 DIAGNOSIS — I361 Nonrheumatic tricuspid (valve) insufficiency: Secondary | ICD-10-CM | POA: Diagnosis not present

## 2019-09-10 DIAGNOSIS — J9600 Acute respiratory failure, unspecified whether with hypoxia or hypercapnia: Secondary | ICD-10-CM

## 2019-09-10 DIAGNOSIS — R042 Hemoptysis: Secondary | ICD-10-CM | POA: Diagnosis present

## 2019-09-10 LAB — TYPE AND SCREEN
ABO/RH(D): O POS
Antibody Screen: NEGATIVE

## 2019-09-10 LAB — MRSA PCR SCREENING: MRSA by PCR: NEGATIVE

## 2019-09-10 LAB — CBC WITH DIFFERENTIAL/PLATELET
Abs Immature Granulocytes: 0.11 10*3/uL — ABNORMAL HIGH (ref 0.00–0.07)
Basophils Absolute: 0 10*3/uL (ref 0.0–0.1)
Basophils Relative: 0 %
Eosinophils Absolute: 0 10*3/uL (ref 0.0–0.5)
Eosinophils Relative: 0 %
HCT: 31.8 % — ABNORMAL LOW (ref 39.0–52.0)
Hemoglobin: 10.4 g/dL — ABNORMAL LOW (ref 13.0–17.0)
Immature Granulocytes: 1 %
Lymphocytes Relative: 4 %
Lymphs Abs: 0.6 10*3/uL — ABNORMAL LOW (ref 0.7–4.0)
MCH: 30.7 pg (ref 26.0–34.0)
MCHC: 32.7 g/dL (ref 30.0–36.0)
MCV: 93.8 fL (ref 80.0–100.0)
Monocytes Absolute: 1.1 10*3/uL — ABNORMAL HIGH (ref 0.1–1.0)
Monocytes Relative: 8 %
Neutro Abs: 12.3 10*3/uL — ABNORMAL HIGH (ref 1.7–7.7)
Neutrophils Relative %: 87 %
Platelets: 312 10*3/uL (ref 150–400)
RBC: 3.39 MIL/uL — ABNORMAL LOW (ref 4.22–5.81)
RDW: 13.7 % (ref 11.5–15.5)
WBC: 14.2 10*3/uL — ABNORMAL HIGH (ref 4.0–10.5)
nRBC: 0 % (ref 0.0–0.2)

## 2019-09-10 LAB — COMPREHENSIVE METABOLIC PANEL
ALT: 110 U/L — ABNORMAL HIGH (ref 0–44)
AST: 134 U/L — ABNORMAL HIGH (ref 15–41)
Albumin: 2.3 g/dL — ABNORMAL LOW (ref 3.5–5.0)
Alkaline Phosphatase: 211 U/L — ABNORMAL HIGH (ref 38–126)
Anion gap: 8 (ref 5–15)
BUN: 17 mg/dL (ref 8–23)
CO2: 23 mmol/L (ref 22–32)
Calcium: 8.8 mg/dL — ABNORMAL LOW (ref 8.9–10.3)
Chloride: 101 mmol/L (ref 98–111)
Creatinine, Ser: 0.92 mg/dL (ref 0.61–1.24)
GFR calc Af Amer: >60 mL/min (ref >60)
GFR calc non Af Amer: >60 mL/min (ref >60)
Glucose, Bld: 124 mg/dL — ABNORMAL HIGH (ref 70–99)
Potassium: 3.3 mmol/L — ABNORMAL LOW (ref 3.5–5.1)
Sodium: 132 mmol/L — ABNORMAL LOW (ref 135–145)
Total Bilirubin: 1.3 mg/dL — ABNORMAL HIGH (ref 0.3–1.2)
Total Protein: 6.5 g/dL (ref 6.5–8.1)

## 2019-09-10 LAB — PROTIME-INR
INR: 2.7 — ABNORMAL HIGH (ref 0.8–1.2)
Prothrombin Time: 28.6 seconds — ABNORMAL HIGH (ref 11.4–15.2)

## 2019-09-10 LAB — PROCALCITONIN: Procalcitonin: 1.53 ng/mL

## 2019-09-10 LAB — MAGNESIUM: Magnesium: 1.9 mg/dL (ref 1.7–2.4)

## 2019-09-10 LAB — POC OCCULT BLOOD, ED: Fecal Occult Bld: NEGATIVE

## 2019-09-10 LAB — LIPASE, BLOOD: Lipase: 31 U/L (ref 11–51)

## 2019-09-10 LAB — ABO/RH: ABO/RH(D): O POS

## 2019-09-10 LAB — APTT: aPTT: 40 seconds — ABNORMAL HIGH (ref 24–36)

## 2019-09-10 MED ORDER — ACETAMINOPHEN 325 MG PO TABS: 650.0000 mg | ORAL_TABLET | Freq: Four times a day (QID) | ORAL | Status: DC | PRN

## 2019-09-10 MED ORDER — ACETAMINOPHEN 650 MG RE SUPP: 650.0000 mg | Freq: Four times a day (QID) | RECTAL | Status: DC | PRN

## 2019-09-10 MED ORDER — ONDANSETRON HCL 4 MG PO TABS: 4.0000 mg | ORAL_TABLET | Freq: Four times a day (QID) | ORAL | Status: DC | PRN

## 2019-09-10 MED ORDER — TIMOLOL MALEATE 0.5 % OP SOLN
1.0000 [drp] | Freq: Two times a day (BID) | OPHTHALMIC | Status: DC
  Administered 2019-09-10 – 2019-09-18 (×16): 1 [drp] via OPHTHALMIC
  Filled 2019-09-10: qty 5

## 2019-09-10 MED ORDER — ONDANSETRON HCL 4 MG/2ML IJ SOLN: 4.0000 mg | Freq: Four times a day (QID) | INTRAMUSCULAR | Status: DC | PRN

## 2019-09-10 MED ORDER — LATANOPROST 0.005 % OP SOLN
1.0000 [drp] | Freq: Every day | OPHTHALMIC | Status: DC
  Administered 2019-09-10 – 2019-09-18 (×9): 1 [drp] via OPHTHALMIC
  Filled 2019-09-10: qty 2.5

## 2019-09-10 MED ORDER — SODIUM CHLORIDE 0.9 % IV SOLN
8.0000 mg/h | INTRAVENOUS | Status: DC
  Administered 2019-09-10: 8 mg/h via INTRAVENOUS
  Filled 2019-09-10: qty 80

## 2019-09-10 MED ORDER — POTASSIUM CHLORIDE 10 MEQ/100ML IV SOLN
10.0000 meq | INTRAVENOUS | Status: AC
  Administered 2019-09-10 (×3): 10 meq via INTRAVENOUS
  Filled 2019-09-10 (×3): qty 100

## 2019-09-10 MED ORDER — PANTOPRAZOLE SODIUM 40 MG IV SOLR
40.0000 mg | Freq: Two times a day (BID) | INTRAVENOUS | Status: DC
  Administered 2019-09-10 – 2019-09-14 (×8): 40 mg via INTRAVENOUS
  Filled 2019-09-10 (×8): qty 40

## 2019-09-10 MED ORDER — CHLORHEXIDINE GLUCONATE CLOTH 2 % EX PADS
6.0000 | MEDICATED_PAD | Freq: Every day | CUTANEOUS | Status: DC
  Administered 2019-09-10 – 2019-09-18 (×6): 6 via TOPICAL

## 2019-09-10 MED ORDER — PIPERACILLIN-TAZOBACTAM 3.375 G IVPB
3.3750 g | Freq: Three times a day (TID) | INTRAVENOUS | Status: DC
  Administered 2019-09-10 – 2019-09-13 (×8): 3.375 g via INTRAVENOUS
  Filled 2019-09-10 (×8): qty 50

## 2019-09-10 MED ORDER — ONDANSETRON HCL 4 MG/2ML IJ SOLN
4.0000 mg | Freq: Once | INTRAMUSCULAR | Status: AC
  Administered 2019-09-10: 16:00:00 4 mg via INTRAVENOUS
  Filled 2019-09-10: qty 2

## 2019-09-10 MED ORDER — SODIUM CHLORIDE 0.9 % IV SOLN: INTRAVENOUS | Status: DC

## 2019-09-10 MED ORDER — SODIUM CHLORIDE 0.9 % IV BOLUS
1000.0000 mL | Freq: Once | INTRAVENOUS | Status: AC
  Administered 2019-09-10: 16:00:00 1000 mL via INTRAVENOUS

## 2019-09-10 MED ORDER — SODIUM CHLORIDE 0.9 % IV SOLN
80.0000 mg | Freq: Once | INTRAVENOUS | Status: AC
  Administered 2019-09-10: 80 mg via INTRAVENOUS
  Filled 2019-09-10 (×2): qty 80

## 2019-09-10 MED ORDER — HYDROCODONE-ACETAMINOPHEN 5-325 MG PO TABS: 1.0000 | ORAL_TABLET | ORAL | Status: DC | PRN

## 2019-09-10 MED ORDER — TAMSULOSIN HCL 0.4 MG PO CAPS
0.4000 mg | ORAL_CAPSULE | Freq: Every day | ORAL | Status: DC
  Administered 2019-09-10 – 2019-09-17 (×8): 0.4 mg via ORAL
  Filled 2019-09-10 (×9): qty 1

## 2019-09-10 MED ORDER — DORZOLAMIDE HCL 2 % OP SOLN
1.0000 [drp] | Freq: Two times a day (BID) | OPHTHALMIC | Status: DC
  Administered 2019-09-10 – 2019-09-18 (×16): 1 [drp] via OPHTHALMIC
  Filled 2019-09-10: qty 10

## 2019-09-10 NOTE — ED Triage Notes (Signed)
Per EMS, Pt is coming from home. Pt reports that he vomited blood 1x episode. EMS described it as bright red and apprx 300-600 ml. Pt is on a blood thinner for afib, and PE.

## 2019-09-10 NOTE — H&P (Signed)
History and Physical        Hospital Admission Note Date: 09/10/2019  Patient name: Joe Cantu Medical record number: [id number] Date of birth: 04/09/1951 Age: 69 y.o. Gender: male  PCP: Iona Beard, MD    Patient coming from: Home  I have reviewed all records in the [facility].    Chief Complaint:  Coughing episode after lunch with hemoptysis today  HPI: Patient is a 69 year old male with history of frequent PVCs, aortic root dilatation, recently admitted 2/17-2/20/2021 and was found to have syncope with acute massive PE, left lower extremity DVT with significant right heart dysfunction.  Patient received catheter directed TPA, was placed on heparin drip then transitioned to Xarelto, lifelong.  Retrievable IVC filter was placed on 2/10, removable in 3 months. History was obtained from the patient who reported that he was in his baseline health, has chronic shortness of breath since discharge.  He was eating lunch, potato soup and started having coughing episode.  Patient reported that he started coughing of blood, somewhat poor historian with difficulty in quantifying how much.  Per EMS report, there was 300- 600 cc of blood in the juice jug.  States that he is compliant with Xarelto.  He denies any epigastric pain or any prior history of gastric ulcers or varices.  No prior history of GI work-up.  Denies any hematochezia or melena.  At baseline, he does have occasional coughing with white sputum, still has shortness of breath with exertion, fatigue and weakness.  States he is not on any O2, needs occasionally walker or cane to ambulate.  Denies any worsening orthopnea PND, weight gain, abdominal distention or peripheral edema  ED work-up/course:  In ED, temp 98.4, respiratory rate 24-30, pulse 94, BP 96/58, O2 sats 90% on room air  Sodium 132, potassium 3.3, BUN 17,  creatinine 0.92, alk phos 211, albumin 2.3, AST 134, ALT 110.  AST was 87, ALT 79, alk phos was 89 on 08/30/2019  WBCs 14.2, hemoglobin 10.4, hematocrit 31.8 INR elevated 2.7  Review of Systems: Positives marked in 'bold' Constitutional: Denies fever, chills, diaphoresis, poor appetite and fatigue.  HEENT: Denies photophobia, eye pain, redness, hearing loss, ear pain, congestion, sore throat, rhinorrhea, sneezing, mouth sores, trouble swallowing, neck pain, neck stiffness and tinnitus.   Respiratory: See HPI Cardiovascular: Denies chest pain, palpitations and leg swelling.  Gastrointestinal: Denies abdominal pain, diarrhea, constipation, blood in stool and abdominal distention.  Genitourinary: Denies dysuria, urgency, frequency, hematuria, flank pain and difficulty urinating.  Musculoskeletal: Denies myalgias, back pain, joint swelling, arthralgias and gait problem.  Skin: Denies pallor, rash and wound.  Neurological: Denies dizziness, seizures, syncope,  light-headedness, numbness and headaches. + Generalized weakness Hematological: Denies adenopathy. Easy bruising, personal or family bleeding history  Psychiatric/Behavioral: Denies suicidal ideation, mood changes, confusion, nervousness, sleep disturbance and agitation  Past Medical History: Past Medical History:  Diagnosis Date  . Aortic root dilatation ([name]) 02/21/2019  . Dyspnea on exertion   . Frequent unifocal PVCs 02/21/2019  . Glaucoma   . Gonorrhea    70 & 32 yrs of age; negative for syphillis, herpes and HIV  . Left inguinal hernia   .  Pneumonia   . Ventricular premature beats   . Wears dentures     Past Surgical History:  Procedure Laterality Date  . COLONOSCOPY  07/2018   Benign polyps   . HIATAL HERNIA REPAIR    . IR ANGIOGRAM PULMONARY BILATERAL SELECTIVE  08/30/2019  . IR INFUSION THROMBOL ARTERIAL INITIAL (MS)  08/30/2019  . IR INFUSION THROMBOL ARTERIAL INITIAL (MS)  08/30/2019  . IR IVC FILTER PLMT / S&I /IMG  GUID/MOD SED  08/31/2019  . IR THROMB F/U EVAL ART/VEN FINAL DAY (MS)  08/31/2019  . IR US GUIDE VASC ACCESS LEFT  08/30/2019  . IR US GUIDE VASC ACCESS LEFT  08/30/2019    Medications: Prior to Admission medications   Medication Sig Start Date End Date Taking? Authorizing Provider  acetaminophen (TYLENOL) 325 MG tablet Take 650 mg by mouth every 6 (six) hours as needed for mild pain or moderate pain.   Yes [provider]  dorzolamide (TRUSOPT) 2 % ophthalmic solution Place 1 drop into both eyes 2 (two) times daily. 08/27/19  Yes [provider]  Latanoprostene Bunod (VYZULTA) 0.024 % SOLN Apply 1 drop to eye daily.   Yes [provider]  metoprolol tartrate (LOPRESSOR) 50 MG tablet Take 1 tablet (50 mg total) by mouth 2 (two) times daily. 09/08/19  Yes Martyn Ehrich, NP  Rivaroxaban 15 & 20 MG TBPK Follow package directions: Take one 10m tablet by mouth twice a day. On day 22, switch to one 256mtablet once a day. Take with food. 09/02/19  Yes HeIsaac BlissEsRayford HalstedMD  tamsulosin (FLOMAX) 0.4 MG CAPS capsule Take 0.4 mg by mouth at bedtime. 10/26/18  Yes [provider]  timolol (TIMOPTIC) 0.5 % ophthalmic solution Place 1 drop into both eyes 2 (two) times daily. 08/27/19  Yes [provider]  polyethylene glycol (MIRALAX / GLYCOLAX) 17 g packet Take 17 g by mouth daily. 09/03/19   HeIsaac BlissEsRayford HalstedMD  rivaroxaban (XARELTO) 20 MG TABS tablet Take 1 tablet (20 mg total) by mouth daily with supper. Start after you are done with the starter pack. 09/02/19   HeIsaac BlissEsRayford HalstedMD    Allergies:  No Known Allergies  Social History:  reports that he quit smoking about 51 years ago. His smoking use included cigarettes. He has a 5.00 pack-year smoking history. He has never used smokeless tobacco. He reports current alcohol use. He reports that he does not use drugs.  Family History: Family History  Problem Relation Age of Onset    . Diabetes Mother   . Cancer Father        colon  . Diabetes Sister   . Diabetes Brother     Physical Exam: Blood pressure (!) 101/56, pulse 96, temperature 99.4 F (37.4 C), resp. rate (!) 30, height '6\' 3"'  (1.905 m), weight 77.1 kg, SpO2 93 %. General: Alert, awake, oriented x3, in no acute distress.  No active hemoptysis or hematemesis Eyes: pink conjunctiva,anicteric sclera, pupils equal and reactive to light and accomodation, HEENT: normocephalic, atraumatic, oropharynx clear Neck: supple, no masses or lymphadenopathy, no goiter, no bruits, no JVD CVS: Regular rate and rhythm, without murmurs, rubs or gallops. No lower extremity edema Resp : Clear to auscultation bilaterally, no wheezing, rales or rhonchi. GI : Soft, nontender, nondistended, positive bowel sounds, no masses. No hepatomegaly. No hernia.  Musculoskeletal: No clubbing or cyanosis, positive pedal pulses. No contracture. ROM intact  Neuro: Grossly intact, no focal neurological deficits,  strength 5/5 upper and lower extremities bilaterally Psych: alert and oriented x 3, normal mood and affect Skin: no rashes or lesions, warm and dry   LABS on Admission: I have personally reviewed all the labs and imagings below    Basic Metabolic Panel: Recent Labs  Lab 09/10/19 1542  NA 132*  K 3.3*  CL 101  CO2 23  GLUCOSE 124*  BUN 17  CREATININE 0.92  CALCIUM 8.8*   Liver Function Tests: Recent Labs  Lab 09/10/19 1542  AST 134*  ALT 110*  ALKPHOS 211*  BILITOT 1.3*  PROT 6.5  ALBUMIN 2.3*   Recent Labs  Lab 09/10/19 1542  LIPASE 31   No results for input(s): AMMONIA in the last 168 hours. CBC: Recent Labs  Lab 09/10/19 1542  WBC 14.2*  NEUTROABS 12.3*  HGB 10.4*  HCT 31.8*  MCV 93.8  PLT 312   Cardiac Enzymes: No results for input(s): CKTOTAL, CKMB, CKMBINDEX, TROPONINI in the last 168 hours. BNP: Invalid input(s): POCBNP CBG: No results for input(s): GLUCAP in the last 168  hours.  Radiological Exams on Admission:  DG Chest 2 View  Result Date: 09/10/2019 CLINICAL DATA:  Hemoptysis.  Recent PE. EXAM: CHEST - 2 VIEW COMPARISON:  08/30/2019 CT FINDINGS: Lateral view degraded by patient arm position. Midline trachea. Mild cardiomegaly. Pulmonary artery prominence. Right hemidiaphragm elevation. No pleural effusion or pneumothorax. Pulmonary interstitial prominence, favored to be due to AP portable technique. Subtle right lower lobe opacity is suspected, likely corresponding to infarct or atelectasis on prior CT. IMPRESSION: Cardiomegaly, without acute disease. Right base pulmonary opacity, similar to on the prior CT. Pulmonary artery enlargement, suggesting pulmonary arterial hypertension. Electronically Signed   By: Abigail Miyamoto M.D.   On: 09/10/2019 17:19      EKG: Independently reviewed.  94, sinus rhythm, LAFB, prolonged QTC 504   Assessment/Plan  Hemoptysis with recent acute massive pulmonary embolism, DVT, history of aortic root dilatation on chronic anticoagulation -Patient was recently hospitalized after syncopal episode and was found to have massive bilateral PE with right heart strain.  Received catheter directed TPA on 2/17 and IVC filter was placed on 2/18.  Patient was initially placed on heparin drip, subsequently transitioned to Xarelto, states he is compliant -Difficult situation as currently having hemoptysis (patient reports no hematemesis, although somewhat difficult to ascertain, bit poor historian), will check FOBT, patient denies having any prior endoscopy, does not drink alcohol -Hold Xarelto, requested pulmonology consult, may need bronchoscopy however will follow their recommendations -If negative bronchoscopy, may need GI endoscopy to rule out any PUD prior to restarting anticoagulation.  Follow FOBT.   Acute transaminitis -Denies any history of alcohol use, recent acute massive PE with severe pulmonary hypertension, moderately reduced  right ventricular systolic function, size severely enlarged, PA pressure 76.2.  No prior history of liver disease.  However patient may have passive congestion due to right heart failure -will obtain acute hepatitis panel, right upper quadrant ultrasound and repeat 2D echo  Aspiration pneumonitis -Patient may have aspirated during the coughing episodes, currently no fevers or chills, has leukocytosis -Obtain procalcitonin, blood cultures, will place on IV Zosyn for now.  DC antibiotics if no fevers or normal procalcitonin.  May need SLP evaluation.   Frequent unifocal PVCs, hypotension -BP currently in 90s, hold off on metoprolol once stable  Prolonged QTC,  -Hold off on QT prolonging medications, keep potassium> 4, mag> 2  Hypokalemia -Potassium 3.3, will replace   DVT prophylaxis: SCDs  CODE  STATUS: Discussed in detail with patient, full CODE STATUS  Consults called: Requested pulmonology consult, EDP will call  Family Communication: Admission, patients condition and plan of care including tests being ordered have been discussed with the patient  who indicates understanding and agree with the plan and Code Status  Admission status: Inpatient stepdown  The medical decision making on this patient was of high complexity and the patient is at high risk for clinical deterioration, therefore this is a level 3 admission.  Severity of Illness:      The appropriate patient status for this patient is INPATIENT. Inpatient status is judged to be reasonable and necessary in order to provide the required intensity of service to ensure the patient's safety. The patient's presenting symptoms, physical exam findings, and initial radiographic and laboratory data in the context of their chronic comorbidities is felt to place them at high risk for further clinical deterioration. Furthermore, it is not anticipated that the patient will be medically stable for discharge from the hospital within 2  midnights of admission. The following factors support the patient status of inpatient.   " The patient's presenting symptoms include hypertension, hemoptysis with recent acute massive bilateral PE with right heart strain, DVT. " The worrisome physical exam findings include hemoptysis " The initial radiographic and laboratory data are worrisome because of hypokalemia, hyponatremia, transaminitis " The chronic co-morbidities include acute massive bilateral PE with right heart strain, DVT   * I certify that at the point of admission it is my clinical judgment that the patient will require inpatient hospital care spanning beyond 2 midnights from the point of admission due to high intensity of service, high risk for further deterioration and high frequency of surveillance required.*    Time Spent on Admission: 70 minutes     Barak Bialecki M.D. [facility] 09/10/2019, 6:18 PM

## 2019-09-10 NOTE — ED Notes (Signed)
Call sister for updates, [name] [phone].

## 2019-09-10 NOTE — Progress Notes (Signed)
Pharmacy Antibiotic Note  Joe Cantu is a 69 y.o. male admitted on 09/10/2019 with hemoptysis.  Pharmacy has been consulted for zosyn dosing for aspiration pneumonitis         Plan: Zosyn 3.375g IV Q8H infused over 4hrs. Renal adjustment unlikely, Pharmacy will sign off and follow peripherally  Height: 6\' 3"  (190.5 cm) Weight: 170 lb (77.1 kg) IBW/kg (Calculated) : 84.5  Temp (24hrs), Avg:99.4 F (37.4 C), Min:99.4 F (37.4 C), Max:99.4 F (37.4 C)  Recent Labs  Lab 09/10/19 1542  WBC 14.2*  CREATININE 0.92    Estimated Creatinine Clearance: 86.1 mL/min (by C-G formula based on SCr of 0.92 mg/dL).    No Known Allergies   Thank you for allowing pharmacy to be a part of this patient's care.  09/12/19 RPh 09/10/2019, 6:32 PM

## 2019-09-10 NOTE — ED Provider Notes (Signed)
[facility]-EMERGENCY DEPT Provider Note   CSN: [id number] Arrival date & time: 09/10/19  1525     History Chief Complaint  Patient presents with  . Hematemesis    Joe Cantu is a 69 y.o. male with history of aortic root dilatation, frequent PVCs, DVT with massive PE presents for evaluation of acute onset expectoration of blood.  He reports that at around 2 PM he was eating potato soup when he began to cough up blood.  He states that he may have also had hematemesis, described as dark red but not coffee-ground.  He has a difficult time telling me if he had hemoptysis or hematemesis or both, but he knows his symptoms definitely began after coughing initially.  Per triage note EMS noted 300 to 600 mL of blood in a juice jug.  He denies nausea or abdominal pain.  He denies any chest pain.  Of note, the patient had a recent admission for a large PE, was started on Xarelto and an IVC filter was placed during the admission as well.  He was discharged 2 days ago.  He reports compliance with Xarelto.  He does note that he remains short of breath but states "it is probably can take a while for this to resolve".  Has not tried anything for his symptoms.  He denies melena or hematochezia, no hematuria.  Patient is a poor historian.  The history is provided by the patient and medical records.       Past Medical History:  Diagnosis Date  . Aortic root dilatation (HCC) 02/21/2019  . Dyspnea on exertion   . Frequent unifocal PVCs 02/21/2019  . Glaucoma   . Gonorrhea    18 & 19 yrs of age; negative for syphillis, herpes and HIV  . Left inguinal hernia   . Pneumonia   . Ventricular premature beats   . Wears dentures     Patient Active Problem List   Diagnosis Date Noted  . Hemoptysis 09/10/2019  . DVT (deep venous thrombosis) (HCC)   . Syncope and collapse   . Acute massive pulmonary embolism (HCC) 08/30/2019  . Aortic root dilatation (HCC) 02/21/2019  . Frequent  unifocal PVCs 02/21/2019  . Palpitations 09/19/2018  . Inguinal hernia without mention of obstruction or gangrene, unilateral or unspecified, (not specified as recurrent)-right 11/24/2012    Past Surgical History:  Procedure Laterality Date  . COLONOSCOPY  07/2018   Benign polyps   . HIATAL HERNIA REPAIR    . IR ANGIOGRAM PULMONARY BILATERAL SELECTIVE  08/30/2019  . IR INFUSION THROMBOL ARTERIAL INITIAL (MS)  08/30/2019  . IR INFUSION THROMBOL ARTERIAL INITIAL (MS)  08/30/2019  . IR IVC FILTER PLMT / S&I /IMG GUID/MOD SED  08/31/2019  . IR THROMB F/U EVAL ART/VEN FINAL DAY (MS)  08/31/2019  . IR US GUIDE VASC ACCESS LEFT  08/30/2019  . IR US GUIDE VASC ACCESS LEFT  08/30/2019       Family History  Problem Relation Age of Onset  . Diabetes Mother   . Cancer Father        colon  . Diabetes Sister   . Diabetes Brother     Social History   Tobacco Use  . Smoking status: Former Smoker    Packs/day: 0.50    Years: 10.00    Pack years: 5.00    Types: Cigarettes    Quit date: 1970    Years since quitting: 51.1  . Smokeless tobacco: Never Used  Substance  Use Topics  . Alcohol use: Yes    Comment: occasional wine  . Drug use: No    Home Medications Prior to Admission medications   Medication Sig Start Date End Date Taking? Authorizing Provider  acetaminophen (TYLENOL) 325 MG tablet Take 650 mg by mouth every 6 (six) hours as needed for mild pain or moderate pain.   Yes [provider]  dorzolamide (TRUSOPT) 2 % ophthalmic solution Place 1 drop into both eyes 2 (two) times daily. 08/27/19  Yes [provider]  Latanoprostene Bunod (VYZULTA) 0.024 % SOLN Apply 1 drop to eye daily.   Yes [provider]  metoprolol tartrate (LOPRESSOR) 50 MG tablet Take 1 tablet (50 mg total) by mouth 2 (two) times daily. 09/08/19  Yes Glenford Bayley, NP  Rivaroxaban 15 & 20 MG TBPK Follow package directions: Take one 15mg  tablet by mouth twice a day. On day 22, switch  to one 20mg  tablet once a day. Take with food. 09/02/19  Yes Philip Aspen, Limmie Patricia, MD  tamsulosin (FLOMAX) 0.4 MG CAPS capsule Take 0.4 mg by mouth at bedtime. 10/26/18  Yes [provider]  timolol (TIMOPTIC) 0.5 % ophthalmic solution Place 1 drop into both eyes 2 (two) times daily. 08/27/19  Yes [provider]  polyethylene glycol (MIRALAX / GLYCOLAX) 17 g packet Take 17 g by mouth daily. 09/03/19   Philip Aspen, Limmie Patricia, MD  rivaroxaban (XARELTO) 20 MG TABS tablet Take 1 tablet (20 mg total) by mouth daily with supper. Start after you are done with the starter pack. 09/02/19   Philip Aspen, Limmie Patricia, MD    Allergies    Patient has no known allergies.  Review of Systems   Review of Systems  Respiratory: Positive for shortness of breath.   Cardiovascular: Negative for chest pain.  Gastrointestinal: Positive for nausea and vomiting. Negative for abdominal pain and blood in stool.  Genitourinary: Negative for dysuria, frequency, hematuria and urgency.  All other systems reviewed and are negative.   Physical Exam Updated Vital Signs BP 96/64   Pulse 95   Temp 99.4 F (37.4 C)   Resp (!) 27   Ht 6\' 3"  (1.905 m)   Wt 77.1 kg   SpO2 93%   BMI 21.25 kg/m   Physical Exam Vitals and nursing note reviewed.  Constitutional:      General: He is not in acute distress.    Appearance: He is well-developed.  HENT:     Head: Normocephalic and atraumatic.     Mouth/Throat:     Comments: No blood noted in the posterior oropharynx Eyes:     General:        Right eye: No discharge.        Left eye: No discharge.     Conjunctiva/sclera: Conjunctivae normal.  Neck:     Vascular: No JVD.     Trachea: No tracheal deviation.  Cardiovascular:     Rate and Rhythm: Normal rate and regular rhythm.     Pulses: Normal pulses.     Comments: 2+ radial and DP/PT pulses bilaterally, Homans sign absent bilaterally, no lower extremity edema, no palpable cords, compartments  are soft  Pulmonary:     Effort: Pulmonary effort is normal.     Comments: Globally diminished breath sounds, occasional cough but no blood or sputum expectorated. Abdominal:     General: Bowel sounds are normal. There is no distension.     Palpations: Abdomen is soft.  Tenderness: There is no abdominal tenderness. There is no guarding or rebound.  Genitourinary:    Comments: Examination performed in the presence of a chaperone.  No frank rectal bleeding. Moderate amount of brown stool in rectal vault Musculoskeletal:     Cervical back: Neck supple.  Skin:    General: Skin is warm and dry.     Findings: No erythema.  Neurological:     Mental Status: He is alert.  Psychiatric:        Behavior: Behavior normal.     ED Results / Procedures / Treatments   Labs (all labs ordered are listed, but only abnormal results are displayed) Labs Reviewed  COMPREHENSIVE METABOLIC PANEL - Abnormal; Notable for the following components:      Result Value   Sodium 132 (*)    Potassium 3.3 (*)    Glucose, Bld 124 (*)    Calcium 8.8 (*)    Albumin 2.3 (*)    AST 134 (*)    ALT 110 (*)    Alkaline Phosphatase 211 (*)    Total Bilirubin 1.3 (*)    All other components within normal limits  CBC WITH DIFFERENTIAL/PLATELET - Abnormal; Notable for the following components:   WBC 14.2 (*)    RBC 3.39 (*)    Hemoglobin 10.4 (*)    HCT 31.8 (*)    Neutro Abs 12.3 (*)    Lymphs Abs 0.6 (*)    Monocytes Absolute 1.1 (*)    Abs Immature Granulocytes 0.11 (*)    All other components within normal limits  PROTIME-INR - Abnormal; Notable for the following components:   Prothrombin Time 28.6 (*)    INR 2.7 (*)    All other components within normal limits  APTT - Abnormal; Notable for the following components:   aPTT 40 (*)    All other components within normal limits  SARS CORONAVIRUS 2 (TAT 6-24 HRS)  CULTURE, BLOOD (ROUTINE X 2)  CULTURE, BLOOD (ROUTINE X 2)  LIPASE, BLOOD  PROCALCITONIN    MAGNESIUM  POC OCCULT BLOOD, ED  POC OCCULT BLOOD, ED  TYPE AND SCREEN  ABO/RH    EKG EKG Interpretation  Date/Time:  Sunday September 10 2019 16:07:52 EST Ventricular Rate:  94 PR Interval:    QRS Duration: 95 QT Interval:  403 QTC Calculation: 504 R Axis:   -114 Text Interpretation: Sinus rhythm Left anterior fascicular block Abnormal T, consider ischemia, anterior leads Prolonged QT interval No STEMI Confirmed by Octaviano Glow [phone]) on 09/10/2019 4:15:22 PM   Radiology DG Chest 2 View  Result Date: 09/10/2019 CLINICAL DATA:  Hemoptysis.  Recent PE. EXAM: CHEST - 2 VIEW COMPARISON:  08/30/2019 CT FINDINGS: Lateral view degraded by patient arm position. Midline trachea. Mild cardiomegaly. Pulmonary artery prominence. Right hemidiaphragm elevation. No pleural effusion or pneumothorax. Pulmonary interstitial prominence, favored to be due to AP portable technique. Subtle right lower lobe opacity is suspected, likely corresponding to infarct or atelectasis on prior CT. IMPRESSION: Cardiomegaly, without acute disease. Right base pulmonary opacity, similar to on the prior CT. Pulmonary artery enlargement, suggesting pulmonary arterial hypertension. Electronically Signed   By: Abigail Miyamoto M.D.   On: 09/10/2019 17:19   US Abdomen Limited RUQ  Result Date: 09/10/2019 CLINICAL DATA:  Transaminitis EXAM: ULTRASOUND ABDOMEN LIMITED RIGHT UPPER QUADRANT COMPARISON:  None. FINDINGS: Gallbladder: No gallstones or wall thickening visualized. No sonographic Murphy sign noted by sonographer. Common bile duct: Diameter: 3.5 mm Liver: No focal lesion identified. Within normal limits  in parenchymal echogenicity. Portal vein is patent on color Doppler imaging with normal direction of blood flow towards the liver. Other: None. IMPRESSION: Normal study. Electronically Signed   By: Katherine Mantle M.D.   On: 09/10/2019 19:29    Procedures Procedures (including critical care time)  Medications  Ordered in ED Medications  sodium chloride 0.9 % bolus 1,000 mL (0 mLs Intravenous Stopped 09/10/19 1712)    And  0.9 %  sodium chloride infusion (has no administration in time range)  pantoprazole (PROTONIX) injection 40 mg (has no administration in time range)  piperacillin-tazobactam (ZOSYN) IVPB 3.375 g (has no administration in time range)  potassium chloride 10 mEq in 100 mL IVPB (has no administration in time range)  pantoprazole (PROTONIX) 80 mg in sodium chloride 0.9 % 100 mL IVPB (0 mg Intravenous Stopped 09/10/19 1712)  ondansetron (ZOFRAN) injection 4 mg (4 mg Intravenous Given 09/10/19 1602)    ED Course  I have reviewed the triage vital signs and the nursing notes.  Pertinent labs & imaging results that were available during my care of the patient were reviewed by me and considered in my medical decision making (see chart for details).  Clinical Course as of Sep 09 1950  Sun Sep 10, 2019  1615 69 yo male w/ hx of PE and syncope diagnosed 1-2 weeks ago, now on xarelto, presenting from home with hemoptysis vs hematemesis.  He reports he finished eating potato soup.  Then he thinks he began coughing blood.  He is unsure whether he coughed more blood or vomited blood - and I asked him several different ways about this.  EMS reported approx 300-600 cc of blood in the house on their arrival.  Here in the ED he denies SOB, nausea, epigastric pain.  He is unaware of any hx of gastric ulcer, varices.  He does not drink and has no know liver disease.  He has never had an EGD.  He feels "fine." Now.  He has a benign pulm exam, no hypoxia, and no epigastric ttp.  We'll check labs, and reach out to pulm.  With his recent PE my greater suspicion is for hemoptysis, but he may need endoscopy as well.     [MT]    Clinical Course User Index [MT] Trifan, Kermit Balo, MD   MDM Rules/Calculators/A&P                      Patient presents for evaluation of hemoptysis.  He was recently discharged from  the hospital with diagnosis of massive PE, left lower extremity DVT; he was started on Xarelto and an IVC filter was placed during his hospitalization.  It is difficult to determine if the patient had hemoptysis or hematemesis but he does state definitively that he was coughing when his symptoms began and given his recent diagnosis of PE I suspect that the blood is likely hemoptysis.  Lab work reviewed and interpreted by myself shows a new leukocytosis of 14.2, stable H&H, platelet count within normal limits.  His LFTs are more elevated than when he was admitted to the hospital and his INR today is elevated at 2.7.  The patient has no abdominal pain on examination I have a low suspicion of acute surgical abdominal pathology  5:30PM CONSULT: Spoke with Dr. Sherene Sires with critical care/pulmonology.  He is concerned that the patient may have some underlying liver disease causing his hemoptysis given his worsening LFTs and elevated INR.  He recommends admission to the  hospital service with pulmonology consultation as needed.  Spoke with Dr. Isidoro Donning with [facility] hospitalist service who agrees to assume care of patient and bring him into the hospital for further evaluation and management.  She requests FOBT which was negative and the patient has no frank rectal bleeding on examination.  Final Clinical Impression(s) / ED Diagnoses Final diagnoses:  Hemoptysis    Rx / DC Orders ED Discharge Orders    None       Bennye Alm 09/10/19 1957    Terald Sleeper, MD 09/11/19 [phone]

## 2019-09-10 NOTE — Consult Note (Addendum)
@LOGO @  Joe Cantu, male    DOB: [birth year], 69 y.o.   MRN: [id number]   Brief patient profile:  66 yobm remote smoker s/p admit 2/17- 08/2018 eval by Mannam:   Hemoptysis with recent acute massive pulmonary embolism, DVT, history of aortic root dilatation on chronic anticoagulation -  hospitalized after syncopal episode and was found to have massive bilateral PE with right heart strain.  Received catheter directed TPA on 2/17 and IVC filter was placed on 2/18.  Patient was initially placed on heparin drip, subsequently transitioned to Xarelto and f/u with NP Volanda Napoleon  2/26 continued doe s 02 need but no cp, hemoptysis then acute onset hemoptysis p eating lunch ? Gagged?  on day of admit x ? 300-600 cc to brought to ER where no drop in hgb and no acute change on cxr and PCCM service asked to consult.  Note w/u also c/w ?liver dz with INR elevation.  Note pt denies any increase sob with onset of hemoptysis, also no cp, no sob over baseline, no assoc epistaxis. Has not had any recurrent bleeding since arrival with mostly dry cough.      Past Medical History:  Diagnosis Date  . Aortic root dilatation ([place]) 02/21/2019  . Dyspnea on exertion   . Frequent unifocal PVCs 02/21/2019  . Glaucoma   . Gonorrhea    76 & 56 yrs of age; negative for syphillis, herpes and HIV  . Left inguinal hernia   . Pneumonia   . Ventricular premature beats   . Wears dentures      No obvious day to day or daytime variability or assoc excess/ purulent sputum or mucus plugs or  chest tightness, subjective wheeze or overt sinus or hb symptoms.   Sleeping without nocturnal  or early am exacerbation  of respiratory  c/o's or need for noct saba. Also denies any obvious fluctuation of symptoms with weather or environmental changes or other aggravating or alleviating factors except as outlined above   No unusual exposure hx or h/o childhood pna/ asthma or knowledge of premature birth.  Current Allergies, Complete Past  Medical History, Past Surgical History, Family History, and Social History were reviewed in [employer] record.  ROS  The following are not active complaints unless bolded Hoarseness, sore throat, dysphagia, dental problems, itching, sneezing,  nasal congestion or discharge of excess mucus or purulent secretions, ear ache,   fever, chills, sweats, unintended wt loss or wt gain, classically pleuritic or exertional cp,  orthopnea pnd or arm/hand swelling  or leg swelling, presyncope, palpitations, abdominal pain, anorexia, nausea, vomiting, diarrhea  or change in bowel habits or change in bladder habits, change in stools or change in urine, dysuria, hematuria,  rash, arthralgias, visual complaints, headache, numbness, weakness or ataxia or problems with walking or coordination,  change in mood or  memory.        Current Meds  Medication Sig  . acetaminophen (TYLENOL) 325 MG tablet Take 650 mg by mouth every 6 (six) hours as needed for mild pain or moderate pain.  . dorzolamide (TRUSOPT) 2 % ophthalmic solution Place 1 drop into both eyes 2 (two) times daily.  . Latanoprostene Bunod (VYZULTA) 0.024 % SOLN Apply 1 drop to eye daily.  . metoprolol tartrate (LOPRESSOR) 50 MG tablet Take 1 tablet (50 mg total) by mouth 2 (two) times daily.  . Rivaroxaban 15 & 20 MG TBPK Follow package directions: Take one 15mg  tablet by mouth twice a day. On day  22, switch to one 20mg  tablet once a day. Take with food.  . tamsulosin (FLOMAX) 0.4 MG CAPS capsule Take 0.4 mg by mouth at bedtime.  . timolol (TIMOPTIC) 0.5 % ophthalmic solution Place 1 drop into both eyes 2 (two) times daily.               Objective:     BP 96/64   Pulse 95   Temp 99.4 F (37.4 C)   Resp (!) 27   Ht 6\' 3"  (1.905 m)   Wt 77.1 kg   SpO2 93%   BMI 21.25 kg/m   SpO2: 93 %   Elderly bm nad RA  Pt alert, approp nad @ 30 degrees  No jvd Oropharynx clear,  mucosa nl Neck supple Lungs clear  bilaterally RRR no s3 or or sign murmur or def increase in P2 Abd soft/ nl  excursion  Extr warm with no edema or clubbing noted Neuro  Sensorium nl ,  no apparent motor deficits      CXR PA and Lateral:   09/10/2019 :    I personally reviewed images and agree with radiology impression as follows:    Cardiomegaly, without acute disease.  Right base pulmonary opacity, similar to on the prior CT.  Pulmonary artery enlargement, suggesting pulmonary arterial Hypertension.  CTa 08/30/19 c/w massive PE plus: Triangular subpleural opacities in the right upper and lower lobes likely pulmonary infarct. Diminished pulmonary vessels in the right lung related to pulmonary embolus. Subpleural opacities in the left lower lobe favor atelectasis. No pulmonary mass. No pulmonary edema.     Lab Results  Component Value Date   HGB 10.4 (L) 09/10/2019   HGB 10.7 (L) 09/02/2019   HGB 10.8 (L) 09/01/2019     Labs ordered/ reviewed:    Lab Results  Component Value Date   ALT 110 (H) 09/10/2019   AST 134 (H) 09/10/2019   ALKPHOS 211 (H) 09/10/2019   BILITOT 1.3 (H) 09/10/2019     Lab Results  Component Value Date   WBC 14.2 (H) 09/10/2019   HGB 10.4 (L) 09/10/2019   HCT 31.8 (L) 09/10/2019   MCV 93.8 09/10/2019   PLT 312 09/10/2019      Lab Results  Component Value Date   WBC 14.2 (H) 09/10/2019   HGB 10.4 (L) 09/10/2019   HCT 31.8 (L) 09/10/2019   MCV 93.8 09/10/2019   PLT 312 09/10/2019      Lab Results  Component Value Date   INR 2.7 (H) 09/10/2019   INR 1.2 08/30/2019             Assessment   1) ? recurrent hemoptysis in setting of recent PE and excessive anticoagulation secondary to acquired transaminitis ? etiology - s/p IVC filter 2/18 and no pain on inspiration so not likely this is recurrent PE and  no need for coagulation at all, at least not in the short run, so rec stop DOAC and correct INR if possible   2) no role for fob given CTa findings as  above - very low yield though if hemoptysis persists this could be considered  Dr 09/01/2019 who did the original eval will be the [facility] doc in am and I'll have him take a look over the record as well to determine if any additional w/u warranted and help with long term recs for doac as well.   3)  Elevated LFT's  With coagulopathy > w/u in progress. - doubt RV failure caused this (  that's why we did the thombolytics here ) but agree repeat echo appropriate    Sandrea Hughs, MD 09/10/2019

## 2019-09-11 ENCOUNTER — Encounter (HOSPITAL_COMMUNITY): Payer: Self-pay | Admitting: Internal Medicine

## 2019-09-11 ENCOUNTER — Other Ambulatory Visit: Payer: Self-pay | Admitting: Oncology

## 2019-09-11 ENCOUNTER — Inpatient Hospital Stay (HOSPITAL_COMMUNITY): Payer: BC Managed Care – PPO

## 2019-09-11 DIAGNOSIS — I361 Nonrheumatic tricuspid (valve) insufficiency: Secondary | ICD-10-CM

## 2019-09-11 LAB — COMPREHENSIVE METABOLIC PANEL
ALT: 89 U/L — ABNORMAL HIGH (ref 0–44)
AST: 70 U/L — ABNORMAL HIGH (ref 15–41)
Albumin: 2.2 g/dL — ABNORMAL LOW (ref 3.5–5.0)
Alkaline Phosphatase: 173 U/L — ABNORMAL HIGH (ref 38–126)
Anion gap: 10 (ref 5–15)
BUN: 15 mg/dL (ref 8–23)
CO2: 24 mmol/L (ref 22–32)
Calcium: 8.9 mg/dL (ref 8.9–10.3)
Chloride: 105 mmol/L (ref 98–111)
Creatinine, Ser: 0.88 mg/dL (ref 0.61–1.24)
GFR calc Af Amer: >60 mL/min (ref >60)
GFR calc non Af Amer: >60 mL/min (ref >60)
Glucose, Bld: 110 mg/dL — ABNORMAL HIGH (ref 70–99)
Potassium: 4.7 mmol/L (ref 3.5–5.1)
Sodium: 139 mmol/L (ref 135–145)
Total Bilirubin: 1.3 mg/dL — ABNORMAL HIGH (ref 0.3–1.2)
Total Protein: 6 g/dL — ABNORMAL LOW (ref 6.5–8.1)

## 2019-09-11 LAB — HEPATITIS PANEL, ACUTE
HCV Ab: NONREACTIVE
Hep A IgM: NONREACTIVE
Hep B C IgM: NONREACTIVE
Hepatitis B Surface Ag: NONREACTIVE

## 2019-09-11 LAB — ECHOCARDIOGRAM COMPLETE
Height: 75 in
Weight: 2726.65 oz

## 2019-09-11 LAB — CBC
HCT: 32.8 % — ABNORMAL LOW (ref 39.0–52.0)
Hemoglobin: 11 g/dL — ABNORMAL LOW (ref 13.0–17.0)
MCH: 31.1 pg (ref 26.0–34.0)
MCHC: 33.5 g/dL (ref 30.0–36.0)
MCV: 92.7 fL (ref 80.0–100.0)
Platelets: 328 10*3/uL (ref 150–400)
RBC: 3.54 MIL/uL — ABNORMAL LOW (ref 4.22–5.81)
RDW: 13.8 % (ref 11.5–15.5)
WBC: 16.7 10*3/uL — ABNORMAL HIGH (ref 4.0–10.5)
nRBC: 0 % (ref 0.0–0.2)

## 2019-09-11 LAB — BLOOD CULTURE ID PANEL (REFLEXED)

## 2019-09-11 LAB — SARS CORONAVIRUS 2 (TAT 6-24 HRS): SARS Coronavirus 2: NEGATIVE

## 2019-09-11 MED ORDER — SODIUM CHLORIDE (PF) 0.9 % IJ SOLN
INTRAMUSCULAR | Status: AC
  Filled 2019-09-11: qty 50

## 2019-09-11 MED ORDER — METOPROLOL TARTRATE 25 MG PO TABS
12.5000 mg | ORAL_TABLET | Freq: Two times a day (BID) | ORAL | Status: DC
  Administered 2019-09-11 – 2019-09-18 (×15): 12.5 mg via ORAL
  Filled 2019-09-11 (×15): qty 1

## 2019-09-11 MED ORDER — IOHEXOL 350 MG/ML SOLN
100.0000 mL | Freq: Once | INTRAVENOUS | Status: AC | PRN
  Administered 2019-09-11: 100 mL via INTRAVENOUS

## 2019-09-11 NOTE — Progress Notes (Signed)
  Echocardiogram 2D Echocardiogram has been attempted. Patient leaving for CT.  Joe Cantu Haydn Cush 09/11/2019, 9:27 AM

## 2019-09-11 NOTE — Progress Notes (Addendum)
[facility]                                                                              Patient Demographics  Joe Cantu, is a 69 y.o. male, DOB - April 27, 1951, OMB:[id number]  Admit date - 09/10/2019   Admitting Physician Bemnet Trovato Krystal Eaton, MD  Outpatient Primary MD for the patient is Iona Beard, MD  Outpatient specialists:   LOS - 1  days   Medical records reviewed and are as summarized below:    Chief Complaint  Patient presents with  . Hematemesis       Brief summary   Patient is a 69 year old male with history of frequent PVCs, aortic root dilatation, recently admitted 2/17-2/20/2021 and was found to have syncope with acute massive PE, left lower extremity DVT with significant right heart dysfunction.  Patient received catheter directed TPA, was placed on heparin drip then transitioned to Xarelto, lifelong.  Retrievable IVC filter was placed on 2/10, removable in 3 months. Patient presented to ED with hemoptysis, while he was eating lunch (potato soup), started having coughing.  Compliant with Xarelto.  Denied any vomiting/hematemesis, hematochezia or melena.  At baseline has chronic shortness of breath with exertion, fatigue and weakness. In ED, patient was noted to have borderline BP 96/58, O2 sats 90% on room air, afebrile, pulse 94, respiratory 24-30 LFTs were somewhat elevated, INR 2.7, hemoglobin 10.4, hematocrit 31.8  Patient was admitted for further work-up, pulmonology was consulted.  Assessment & Plan     Hemoptysis with recent acute massive pulmonary embolism, DVT, history of aortic root dilatation on chronic anticoagulation -Patient was recently hospitalized after syncopal episode and was found to have massive bilateral PE with right heart strain.  Received catheter directed TPA on 2/17 and IVC filter was placed on 2/18.  Patient was initially placed on heparin drip, subsequently transitioned to Xarelto, states he is compliant -Xarelto was held.   Pulmonology consult was obtained.  Overnight, patient reports no further hemoptysis.  Had coughing with whitish sputum. -Per pulmonology, obtain CT chest to evaluate lung and possible source of bleed, bronchoscopy in a.m. if still has significant clot burden and normal airways, will restart anticoagulation  Recent PE, LLE DVT, factor V Leyden heterozygote -Status post catheter directed thrombolysis, IVC filter during previous admission, was discharged on Xarelto -Pulmonology recommending hematology consult, discussed with Dr. Jana Hakim, will evaluate patient for further recommendations -Repeat CT chest today results pending   Acute transaminitis -Denies any history of alcohol use, recent acute massive PE with severe pulmonary hypertension, moderately reduced right ventricular systolic function, size severely enlarged, PA pressure 76.2.  No prior history of liver disease.  However patient may have passive congestion due to right heart failure -Acute hepatitis panel negative, right upper quadrant ultrasound normal study, no focal liver lesions identified. -Repeating 2D echo, follow results -LFTs improving  Aspiration pneumonitis -Patient may have aspirated during the coughing episodes, currently no fevers or chills, has leukocytosis -Still having coughing with whitish sputum this morning, states no hemoptysis today.   -WBC count trending up, procalcitonin 1.53, will continue IV Zosyn, SLP evaluation.    Frequent unifocal PVCs, hypotension -  BP now stable, will resume metoprolol at lower dose, 12.5 mg twice daily (at home on 50 mg twice daily)  Prolonged QTC,  -Hold off on QT prolonging medications, keep potassium> 4, mag> 2  Hypokalemia -Improved, potassium 4.7   Code Status: Full CODE STATUS DVT Prophylaxis:   SCD's Family Communication: Discussed all imaging results, lab results, explained to the patient    Disposition Plan: Patient from home, anticipated discharge to home  once cleared from pulmonology and hematology oncology.  Current work-up for hemoptysis in the setting of anticoagulation for recent massive PE, DVT precludes safe discharge home at this time.   Time Spent in minutes   35 minutes  Procedures:  None  Consultants:   Pulmonology, Dr. Vaughan Browner Oncology, Dr. Jana Hakim  Antimicrobials:   Anti-infectives (From admission, onward)   Start     Dose/Rate Route Frequency Ordered Stop   09/10/19 2000  piperacillin-tazobactam (ZOSYN) IVPB 3.375 g     3.375 g 12.5 mL/hr over 240 Minutes Intravenous Every 8 hours 09/10/19 1833            Medications  Scheduled Meds: . Chlorhexidine Gluconate Cloth  6 each Topical Daily  . dorzolamide  1 drop Both Eyes BID  . latanoprost  1 drop Both Eyes Daily  . pantoprazole (PROTONIX) IV  40 mg Intravenous Q12H  . sodium chloride (PF)      . tamsulosin  0.4 mg Oral QHS  . timolol  1 drop Both Eyes BID   Continuous Infusions: . piperacillin-tazobactam (ZOSYN)  IV Stopped (09/11/19 0714)   PRN Meds:.acetaminophen **OR** acetaminophen, HYDROcodone-acetaminophen, ondansetron **OR** ondansetron (ZOFRAN) IV      Subjective:   Joe Cantu was seen and examined today.  States did not sleep well last night, did have coughing episodes however bringing whitish sputum.  Per patient did not have repeat hemoptysis.  No vomiting overnight. Patient denies dizziness, chest pain, abdominal pain, N/V/D/C, new weakness, numbess, tingling. No acute events overnight.    Objective:   Vitals:   09/11/19 0400 09/11/19 0500 09/11/19 0600 09/11/19 0800  BP: 103/66 (!) 108/53 (!) 114/53 118/69  Pulse: 95 94  95  Resp: (!) 32 (!) 36 (!) 28 (!) 33  Temp:    98.7 F (37.1 C)  TempSrc:    Oral  SpO2: 97% 96%  100%  Weight:      Height:        Intake/Output Summary (Last 24 hours) at 09/11/2019 1021 Last data filed at 09/11/2019 5009 Gross per 24 hour  Intake 1585.11 ml  Output 1450 ml  Net 135.11 ml     Wt  Readings from Last 3 Encounters:  09/10/19 77.3 kg  09/08/19 78.1 kg  08/30/19 78 kg     Exam  General: Alert and oriented x 3, NAD  Cardiovascular: S1 S2 auscultated, no murmurs, RRR  Respiratory: Clear to auscultation bilaterally, no wheezing, rales or rhonchi  Gastrointestinal: Soft, nontender, nondistended, + bowel sounds  Ext: no pedal edema bilaterally  Neuro: No new deficit  Musculoskeletal: No digital cyanosis, clubbing  Skin: No rashes  Psych: Normal affect and demeanor, alert and oriented x3    Data Reviewed:  I have personally reviewed following labs and imaging studies  Micro Results Recent Results (from the past 240 hour(s))  SARS CORONAVIRUS 2 (TAT 6-24 HRS) Nasopharyngeal Nasopharyngeal Swab     Status: None   Collection Time: 09/10/19  5:00 PM   Specimen: Nasopharyngeal Swab  Result Value Ref Range Status  SARS Coronavirus 2 NEGATIVE NEGATIVE Final    Comment: (NOTE) SARS-CoV-2 target nucleic acids are NOT DETECTED. The SARS-CoV-2 RNA is generally detectable in upper and lower respiratory specimens during the acute phase of infection. Negative results do not preclude SARS-CoV-2 infection, do not rule out co-infections with other pathogens, and should not be used as the sole basis for treatment or other patient management decisions. Negative results must be combined with clinical observations, patient history, and epidemiological information. The expected result is Negative. Fact Sheet for Patients: SugarRoll.be Fact Sheet for Healthcare Providers: https://www.woods-mathews.com/ This test is not yet approved or cleared by the [place] FDA and  has been authorized for detection and/or diagnosis of SARS-CoV-2 by FDA under an Emergency Use Authorization (EUA). This EUA will remain  in effect (meaning this test can be used) for the duration of the COVID-19 declaration under Section 56 4(b)(1) of the Act,  21 U.S.C. section 360bbb-3(b)(1), unless the authorization is terminated or revoked sooner. Performed at [facility], [place] [address], [place], [place] [address]   MRSA PCR Screening     Status: None   Collection Time: 09/10/19  8:05 PM   Specimen: Nasal Mucosa; Nasopharyngeal  Result Value Ref Range Status   MRSA by PCR NEGATIVE NEGATIVE Final    Comment:        The GeneXpert MRSA Assay (FDA approved for NASAL specimens only), is one component of a comprehensive MRSA colonization surveillance program. It is not intended to diagnose MRSA infection nor to guide or monitor treatment for MRSA infections. Performed at [facility], [place] [address], [place], [place] [address]     Radiology Reports DG Chest 2 View  Result Date: 09/10/2019 CLINICAL DATA:  Hemoptysis.  Recent PE. EXAM: CHEST - 2 VIEW COMPARISON:  08/30/2019 CT FINDINGS: Lateral view degraded by patient arm position. Midline trachea. Mild cardiomegaly. Pulmonary artery prominence. Right hemidiaphragm elevation. No pleural effusion or pneumothorax. Pulmonary interstitial prominence, favored to be due to AP portable technique. Subtle right lower lobe opacity is suspected, likely corresponding to infarct or atelectasis on prior CT. IMPRESSION: Cardiomegaly, without acute disease. Right base pulmonary opacity, similar to on the prior CT. Pulmonary artery enlargement, suggesting pulmonary arterial hypertension. Electronically Signed   By: Abigail Miyamoto M.D.   On: 09/10/2019 17:19   CT Angio Chest PE W and/or Wo Contrast  Result Date: 08/30/2019 CLINICAL DATA:  Syncopal episode while using the bathroom. EXAM: CT ANGIOGRAPHY CHEST WITH CONTRAST TECHNIQUE: Multidetector CT imaging of the chest was performed using the standard protocol during bolus administration of intravenous contrast. Multiplanar CT image reconstructions and MIPs were obtained to evaluate the vascular anatomy. CONTRAST:  162m OMNIPAQUE  IOHEXOL 350 MG/ML SOLN COMPARISON:  None. FINDINGS: Cardiovascular: Massive bilateral pulmonary arterial filling defects with near complete occlusion of the right pulmonary artery, no pulmonary arterial blood flow noted to the right lung. Large thrombus in the left lower lobe pulmonary artery which is partially occlusive. Marked right heart dilatation with contrast refluxing into the hepatic veins and IVC. Elevated RV to LV ratio of 2.16. Aortic atherosclerosis. Cannot assess for dissection given phase of contrast. Mild dilatation of the aortic root at 3.6 cm without aortic aneurysm. No pericardial effusion. Mediastinum/Nodes: No adenopathy. No esophageal wall thickening. No suspicious thyroid nodule. Lungs/Pleura: Triangular subpleural opacities in the right upper and lower lobes likely pulmonary infarct. Diminished pulmonary vessels in the right lung related to pulmonary embolus. Subpleural opacities in the left lower lobe favor atelectasis.  No pulmonary mass. No pulmonary edema. Upper Abdomen: Contrast refluxes into the hepatic veins and IVC. No other acute findings. Musculoskeletal: There are no acute or suspicious osseous abnormalities. Few bone islands in the right proximal humerus. Review of the MIP images confirms the above findings. IMPRESSION: 1. Massive pulmonary emboli with complete occlusion of the right main pulmonary artery and absent pulmonary arterial blood flow to the right lung. Large but partially occlusive filling defects in the left lower lobe pulmonary arteries. Significant right heart strain with RV to LV ratio of greater than 2, right heart dilatation and contrast refluxing into the hepatic veins and IVC. 2. Small pulmonary infarcts in the right upper and lower lobe. Critical Value/emergent results were called by telephone at the time of interpretation on 08/30/2019 at 6:24 am to Dr Addison Lank , who verbally acknowledged these results. Electronically Signed   By: Keith Rake M.D.    On: 08/30/2019 06:30   IR Angiogram Pulmonary Bilateral Selective  Result Date: 08/31/2019 INDICATION: Sub massive pulmonary embolism. Request made for initiation of bilateral catheter directed pulmonary arterial thrombolysis. EXAM: 1. ULTRASOUND GUIDANCE FOR VENOUS ACCESS X2 2. PULMONARY ARTERIOGRAPHY 3. FLUOROSCOPIC GUIDED PLACEMENT OF BILATERAL PULMONARY ARTERIAL LYTIC INFUSION CATHETERS COMPARISON:  Chest CTA-earlier same day MEDICATIONS: None ANESTHESIA/SEDATION: Moderate (conscious) sedation was employed during this procedure. A total of Versed 2.5 mg and Fentanyl 100 mcg was administered intravenously. Moderate Sedation Time: 33 minutes. The patient's level of consciousness and vital signs were monitored continuously by radiology nursing throughout the procedure under my direct supervision. CONTRAST:  53m OMNIPAQUE IOHEXOL 300 MG/ML  SOLN FLUOROSCOPY TIME:  9 minutes, 18 seconds (1235mGy) COMPLICATIONS: None immediate. TECHNIQUE: Informed written consent was obtained from the patient after a discussion of the risks, benefits and alternatives to treatment. Questions regarding the procedure were encouraged and answered. A timeout was performed prior to the initiation of the procedure. Ultrasound scanning was performed of the right groin and demonstrated near occlusive thrombus within the right common femoral vein. Sonographic evaluation the left groin demonstrates wide patency of the left common femoral vein. As such, the left common femoral vein was selected venous access. The left groin was prepped and draped in the usual sterile fashion, and a sterile drape was applied covering the operative field. Maximum barrier sterile technique with sterile gowns and gloves were used for the procedure. A timeout was performed prior to the initiation of the procedure. Local anesthesia was provided with 1% lidocaine. Under direct ultrasound guidance, the right common femoral vein was accessed with a micro puncture kit  ultimately allowing placement of a 6 French, 35 cm vascular sheath. Slightly cranial to this initial access, the right common femoral was again accessed with an additional micropuncture kit ultimately allowing placement of an additional 7 French, 35 cm vascular sheath. Ultrasound images were saved for procedural documentation purposes. With the use of a stiff glidewire, a vertebral catheter was advanced into the main pulmonary artery and a limited central pulmonary arteriogram was performed. Pressure measurements were then obtained from the main pulmonary artery. The vertebral catheter was advanced into the distal branch of the left lower lobe pulmonary artery. Limited contrast injection confirmed appropriate positioning. Over an exchange length Rosen wire, the vertebral catheter was exchanged for a 90/10 cm multi side-hole infusion catheter. Again, with the use of a stiff glidewire, a vertebral catheter was advanced into a distal branch of the right lower lobe pulmonary artery. Limited contrast injection confirmed appropriate positioning. Over an exchange length  Rosen wire, the pigtail catheter was exchanged for a 90/15 cm multi side-hole infusion catheter. A postprocedural fluoroscopic image was obtained to document final catheter positioning. Both vascular sheath were secured at the left groin. The external catheter tubing was secured at the right thigh and the lytic therapy was initiated. The patient tolerated the procedure well without immediate postprocedural complication. FINDINGS: Central pulmonary arteriogram demonstrates significant near occlusive thrombus involving the peripheral aspects of both the right and left main pulmonary arteries with marked dilatation of the central pulmonary arterial system. Additionally, limited right main pulmonary arteriogram demonstrates near occlusive thrombus within the peripheral aspect of the right main pulmonary artery. Acquired pressure measurements: Main  pulmonary  artery-61/16; mean-40 (normal: < 25/10) Following the procedure, both ultrasound assisted infusion catheter tips terminate within the distal aspects of the bilateral lower lobe sub segmental pulmonary arteries. IMPRESSION: 1. Successful fluoroscopic guided initiation of bilateral ultrasound assisted catheter directed pulmonary arterial lysis for sub massive pulmonary embolism and right-sided heart strain. 2. Elevated pressure measurements within the main pulmonary artery compatible with critical pulmonary arterial hypertension. 3. Near occlusive DVT noted within the right common femoral vein. PLAN: Above findings discussed with Dr. Hart Robinsons (critical care) at the time of procedure completion and the decision made to proceed with IVC filter placement for temporary caval interruption purposes at the time of post lysis pressure measurement acquisition. Electronically Signed   By: Sandi Mariscal M.D.   On: 08/31/2019 08:24   IR IVC FILTER PLMT / S&I Burke Keels GUID/MOD SED  Result Date: 08/31/2019 CLINICAL DATA:  Bilateral submassive pulmonary embolism with cor pulmonale. Status post thrombo lytic infusion via each pulmonary artery for 12 hours. Request has also been made to place an IVC filter on completion thrombo lytic therapy given residual DVT in both lower extremities. EXAM: 1. FINAL DAY ARTERIAL THROMBOLYTIC THERAPY PRESSURE MEASUREMENT. 2. IVC VENOGRAM. 3. PERCUTANEOUS IVC FILTER PLACEMENT. ANESTHESIA/SEDATION: 50 mcg IV Fentanyl. The patient was just given fentanyl and not formally sedated. CONTRAST:  84m OMNIPAQUE IOHEXOL 300 MG/ML SOLN, 138mOMNIPAQUE IOHEXOL 300 MG/ML SOLN, 1066mMNIPAQUE IOHEXOL 300 MG/ML SOLN FLUOROSCOPY TIME:  1 minutes and 30 seconds.  146.6 mGy. PROCEDURE: The procedure, risks, benefits, and alternatives were explained to the patient. Questions regarding the procedure were encouraged and answered. The patient understands and consents to the procedure. A time-out was performed prior to  initiating the procedure. Pulmonary artery pressures were directly measured through the pulmonary arterial infusion catheters. The catheters were then removed. Left femoral vein sheaths placed yesterday were prepped and draped. Local anesthesia was provided with 1% Lidocaine. A guidewire was advanced into the inferior vena cava via one of the sheaths. This sheath was removed and the venotomy dilated. A 10 French deployment sheath was advanced over the guidewire. This was utilized to perform IVC venography. The deployment sheath was further positioned in an appropriate location for filter deployment. A Bard Denali IVC filter was then advanced in the sheath. This was then fully deployed in the infrarenal IVC. Final filter position was confirmed with a fluoroscopic spot image. After the procedure the sheath was removed and hemostasis obtained with manual compression. COMPLICATIONS: None. FINDINGS: Measure pulmonary artery pressure is 55/20, mean 28 mm Hg compared to 61/16, 40 mm Hg prior to lytic therapy. IVC venography demonstrates a normal caliber IVC with no evidence of thrombus. Renal veins are identified bilaterally. The IVC filter was successfully positioned below the level of the renal veins and is appropriately oriented. This IVC filter has  both permanent and retrievable indications. IMPRESSION: 1. Decrease in measured pulmonary artery pressure after 12 hours of bilateral pulmonary arterial thrombolytic therapy. 2. Placement of percutaneous IVC filter in infrarenal IVC. IVC venogram shows no evidence of IVC thrombus and normal caliber of the inferior vena cava. This filter does have both permanent and retrievable indications. PLAN: This IVC filter is potentially retrievable. The patient will be assessed for filter retrieval by Interventional Radiology in approximately 8-12 weeks. Further recommendations regarding filter retrieval, continued surveillance or declaration of device permanence, will be made at that  time. Electronically Signed   By: Aletta Edouard M.D.   On: 08/31/2019 14:25   IR US Guide Vasc Access Left  Result Date: 08/31/2019 INDICATION: Sub massive pulmonary embolism. Request made for initiation of bilateral catheter directed pulmonary arterial thrombolysis. EXAM: 1. ULTRASOUND GUIDANCE FOR VENOUS ACCESS X2 2. PULMONARY ARTERIOGRAPHY 3. FLUOROSCOPIC GUIDED PLACEMENT OF BILATERAL PULMONARY ARTERIAL LYTIC INFUSION CATHETERS COMPARISON:  Chest CTA-earlier same day MEDICATIONS: None ANESTHESIA/SEDATION: Moderate (conscious) sedation was employed during this procedure. A total of Versed 2.5 mg and Fentanyl 100 mcg was administered intravenously. Moderate Sedation Time: 33 minutes. The patient's level of consciousness and vital signs were monitored continuously by radiology nursing throughout the procedure under my direct supervision. CONTRAST:  82m OMNIPAQUE IOHEXOL 300 MG/ML  SOLN FLUOROSCOPY TIME:  9 minutes, 18 seconds (1683mGy) COMPLICATIONS: None immediate. TECHNIQUE: Informed written consent was obtained from the patient after a discussion of the risks, benefits and alternatives to treatment. Questions regarding the procedure were encouraged and answered. A timeout was performed prior to the initiation of the procedure. Ultrasound scanning was performed of the right groin and demonstrated near occlusive thrombus within the right common femoral vein. Sonographic evaluation the left groin demonstrates wide patency of the left common femoral vein. As such, the left common femoral vein was selected venous access. The left groin was prepped and draped in the usual sterile fashion, and a sterile drape was applied covering the operative field. Maximum barrier sterile technique with sterile gowns and gloves were used for the procedure. A timeout was performed prior to the initiation of the procedure. Local anesthesia was provided with 1% lidocaine. Under direct ultrasound guidance, the right common femoral  vein was accessed with a micro puncture kit ultimately allowing placement of a 6 French, 35 cm vascular sheath. Slightly cranial to this initial access, the right common femoral was again accessed with an additional micropuncture kit ultimately allowing placement of an additional 7 French, 35 cm vascular sheath. Ultrasound images were saved for procedural documentation purposes. With the use of a stiff glidewire, a vertebral catheter was advanced into the main pulmonary artery and a limited central pulmonary arteriogram was performed. Pressure measurements were then obtained from the main pulmonary artery. The vertebral catheter was advanced into the distal branch of the left lower lobe pulmonary artery. Limited contrast injection confirmed appropriate positioning. Over an exchange length Rosen wire, the vertebral catheter was exchanged for a 90/10 cm multi side-hole infusion catheter. Again, with the use of a stiff glidewire, a vertebral catheter was advanced into a distal branch of the right lower lobe pulmonary artery. Limited contrast injection confirmed appropriate positioning. Over an exchange length Rosen wire, the pigtail catheter was exchanged for a 90/15 cm multi side-hole infusion catheter. A postprocedural fluoroscopic image was obtained to document final catheter positioning. Both vascular sheath were secured at the left groin. The external catheter tubing was secured at the right thigh and the lytic therapy  was initiated. The patient tolerated the procedure well without immediate postprocedural complication. FINDINGS: Central pulmonary arteriogram demonstrates significant near occlusive thrombus involving the peripheral aspects of both the right and left main pulmonary arteries with marked dilatation of the central pulmonary arterial system. Additionally, limited right main pulmonary arteriogram demonstrates near occlusive thrombus within the peripheral aspect of the right main pulmonary artery.  Acquired pressure measurements: Main  pulmonary artery-61/16; mean-40 (normal: < 25/10) Following the procedure, both ultrasound assisted infusion catheter tips terminate within the distal aspects of the bilateral lower lobe sub segmental pulmonary arteries. IMPRESSION: 1. Successful fluoroscopic guided initiation of bilateral ultrasound assisted catheter directed pulmonary arterial lysis for sub massive pulmonary embolism and right-sided heart strain. 2. Elevated pressure measurements within the main pulmonary artery compatible with critical pulmonary arterial hypertension. 3. Near occlusive DVT noted within the right common femoral vein. PLAN: Above findings discussed with Dr. Hart Robinsons (critical care) at the time of procedure completion and the decision made to proceed with IVC filter placement for temporary caval interruption purposes at the time of post lysis pressure measurement acquisition. Electronically Signed   By: Sandi Mariscal M.D.   On: 08/31/2019 08:24   IR US Guide Vasc Access Left  Result Date: 08/31/2019 INDICATION: Sub massive pulmonary embolism. Request made for initiation of bilateral catheter directed pulmonary arterial thrombolysis. EXAM: 1. ULTRASOUND GUIDANCE FOR VENOUS ACCESS X2 2. PULMONARY ARTERIOGRAPHY 3. FLUOROSCOPIC GUIDED PLACEMENT OF BILATERAL PULMONARY ARTERIAL LYTIC INFUSION CATHETERS COMPARISON:  Chest CTA-earlier same day MEDICATIONS: None ANESTHESIA/SEDATION: Moderate (conscious) sedation was employed during this procedure. A total of Versed 2.5 mg and Fentanyl 100 mcg was administered intravenously. Moderate Sedation Time: 33 minutes. The patient's level of consciousness and vital signs were monitored continuously by radiology nursing throughout the procedure under my direct supervision. CONTRAST:  71m OMNIPAQUE IOHEXOL 300 MG/ML  SOLN FLUOROSCOPY TIME:  9 minutes, 18 seconds (1974mGy) COMPLICATIONS: None immediate. TECHNIQUE: Informed written consent was obtained from the  patient after a discussion of the risks, benefits and alternatives to treatment. Questions regarding the procedure were encouraged and answered. A timeout was performed prior to the initiation of the procedure. Ultrasound scanning was performed of the right groin and demonstrated near occlusive thrombus within the right common femoral vein. Sonographic evaluation the left groin demonstrates wide patency of the left common femoral vein. As such, the left common femoral vein was selected venous access. The left groin was prepped and draped in the usual sterile fashion, and a sterile drape was applied covering the operative field. Maximum barrier sterile technique with sterile gowns and gloves were used for the procedure. A timeout was performed prior to the initiation of the procedure. Local anesthesia was provided with 1% lidocaine. Under direct ultrasound guidance, the right common femoral vein was accessed with a micro puncture kit ultimately allowing placement of a 6 French, 35 cm vascular sheath. Slightly cranial to this initial access, the right common femoral was again accessed with an additional micropuncture kit ultimately allowing placement of an additional 7 French, 35 cm vascular sheath. Ultrasound images were saved for procedural documentation purposes. With the use of a stiff glidewire, a vertebral catheter was advanced into the main pulmonary artery and a limited central pulmonary arteriogram was performed. Pressure measurements were then obtained from the main pulmonary artery. The vertebral catheter was advanced into the distal branch of the left lower lobe pulmonary artery. Limited contrast injection confirmed appropriate positioning. Over an exchange length Rosen wire, the vertebral catheter was exchanged for  a 90/10 cm multi side-hole infusion catheter. Again, with the use of a stiff glidewire, a vertebral catheter was advanced into a distal branch of the right lower lobe pulmonary artery. Limited  contrast injection confirmed appropriate positioning. Over an exchange length Rosen wire, the pigtail catheter was exchanged for a 90/15 cm multi side-hole infusion catheter. A postprocedural fluoroscopic image was obtained to document final catheter positioning. Both vascular sheath were secured at the left groin. The external catheter tubing was secured at the right thigh and the lytic therapy was initiated. The patient tolerated the procedure well without immediate postprocedural complication. FINDINGS: Central pulmonary arteriogram demonstrates significant near occlusive thrombus involving the peripheral aspects of both the right and left main pulmonary arteries with marked dilatation of the central pulmonary arterial system. Additionally, limited right main pulmonary arteriogram demonstrates near occlusive thrombus within the peripheral aspect of the right main pulmonary artery. Acquired pressure measurements: Main  pulmonary artery-61/16; mean-40 (normal: < 25/10) Following the procedure, both ultrasound assisted infusion catheter tips terminate within the distal aspects of the bilateral lower lobe sub segmental pulmonary arteries. IMPRESSION: 1. Successful fluoroscopic guided initiation of bilateral ultrasound assisted catheter directed pulmonary arterial lysis for sub massive pulmonary embolism and right-sided heart strain. 2. Elevated pressure measurements within the main pulmonary artery compatible with critical pulmonary arterial hypertension. 3. Near occlusive DVT noted within the right common femoral vein. PLAN: Above findings discussed with Dr. Hart Robinsons (critical care) at the time of procedure completion and the decision made to proceed with IVC filter placement for temporary caval interruption purposes at the time of post lysis pressure measurement acquisition. Electronically Signed   By: Sandi Mariscal M.D.   On: 08/31/2019 08:24   ECHOCARDIOGRAM COMPLETE  Result Date: 08/30/2019    ECHOCARDIOGRAM  REPORT   Patient Name:   Joe Cantu Date of Exam: 08/30/2019 Medical Rec #:  [id number]     Height:       75.0 in Accession #:    [id number]    Weight:       172.0 lb Date of Birth:  June 08, 1951     BSA:          2.06 m Patient Age:    36 years      BP:           122/87 mmHg Patient Gender: M             HR:           98 bpm. Exam Location:  Inpatient Procedure: 2D Echo, Cardiac Doppler and Color Doppler STAT ECHO Indications:    R55 Syncope  History:        Patient has no prior history of Echocardiogram examinations.                 Aortic Rood Dilatation.  Sonographer:    Jonelle Sidle Dance Referring Phys: [id number] [place]  1. Severe RA/RV dilation with reduced RV function and severe pulmonary hypertension. LV hyperdynamic and borderline small cavity/underfilled. No hemodynamically significant valve disease.  2. Left ventricular ejection fraction, by estimation, is 65 to 70%. The left ventricle has hyperdynamic function. The left ventricle has no regional wall motion abnormalities. There is moderate concentric left ventricular hypertrophy. Left ventricular diastolic function could not be evaluated. There is the interventricular septum is flattened in systole and diastole, consistent with right ventricular pressure and volume overload.  3. Right ventricular systolic function is moderately reduced. The right ventricular size is severely enlarged. There  is severely elevated pulmonary artery systolic pressure. The estimated right ventricular systolic pressure is 16.1 mmHg.  4. Right atrial size was severely dilated.  5. The mitral valve is normal in structure and function. Trivial mitral valve regurgitation. No evidence of mitral stenosis.  6. Tricuspid valve regurgitation is moderate.  7. The aortic valve is tricuspid. Aortic valve regurgitation is trivial. No aortic stenosis is present.  8. Aortic dilatation noted. There is mild dilatation of the ascending aorta measuring 37 mm.  9. Mildly  dilated pulmonary artery. 10. The inferior vena cava is dilated in size with <50% respiratory variability, suggesting right atrial pressure of 15 mmHg. 11. Cannot exclude IAS shunt. Comparison(s): No prior Echocardiogram. FINDINGS  Left Ventricle: Left ventricular ejection fraction, by estimation, is 65 to 70%. The left ventricle has hyperdynamic function. The left ventricle has no regional wall motion abnormalities. The left ventricular internal cavity size was small. There is moderate concentric left ventricular hypertrophy. The interventricular septum is flattened in systole and diastole, consistent with right ventricular pressure and volume overload. Left ventricular diastolic function could not be evaluated. Right Ventricle: The right ventricular size is severely enlarged. Right vetricular wall thickness was not assessed. Right ventricular systolic function is moderately reduced. There is severely elevated pulmonary artery systolic pressure. The tricuspid regurgitant velocity is 3.91 m/s, and with an assumed right atrial pressure of 15 mmHg, the estimated right ventricular systolic pressure is 09.6 mmHg. Left Atrium: Left atrial size was normal in size. Right Atrium: Right atrial size was severely dilated. Pericardium: There is no evidence of pericardial effusion. Mitral Valve: The mitral valve is normal in structure and function. Trivial mitral valve regurgitation. No evidence of mitral valve stenosis. Tricuspid Valve: The tricuspid valve is normal in structure. Tricuspid valve regurgitation is moderate . No evidence of tricuspid stenosis. Aortic Valve: The aortic valve is tricuspid. Aortic valve regurgitation is trivial. No aortic stenosis is present. Pulmonic Valve: The pulmonic valve was grossly normal. Pulmonic valve regurgitation is mild. No evidence of pulmonic stenosis. Aorta: Aortic dilatation noted. There is mild dilatation of the ascending aorta measuring 37 mm. Pulmonary Artery: The pulmonary artery  is mildly dilated. Venous: The inferior vena cava is dilated in size with less than 50% respiratory variability, suggesting right atrial pressure of 15 mmHg. IAS/Shunts: Cannot exclude IAS shunt. Additional Comments: There is a small pleural effusion in both left and right lateral regions.  LEFT VENTRICLE PLAX 2D LVIDd:         3.33 cm LVIDs:         2.20 cm LV PW:         1.75 cm LV IVS:        2.02 cm LVOT diam:     2.40 cm LV SV:         72.61 ml LV SV Index:   14.28 LVOT Area:     4.52 cm  RIGHT VENTRICLE          IVC RV Basal diam:  4.20 cm  IVC diam: 2.20 cm RV Mid diam:    3.00 cm TAPSE (M-mode): 1.4 cm LEFT ATRIUM             Index       RIGHT ATRIUM           Index LA diam:        3.40 cm 1.65 cm/m  RA Area:     39.50 cm LA Vol (A2C):   42.1 ml 20.46 ml/m RA Volume:  182.50 ml 88.67 ml/m LA Vol (A4C):   43.0 ml 20.89 ml/m LA Biplane Vol: 42.4 ml 20.60 ml/m  AORTIC VALVE LVOT Vmax:   102.40 cm/s LVOT Vmean:  57.500 cm/s LVOT VTI:    0.160 m  AORTA Ao Root diam: 4.20 cm Ao Asc diam:  3.70 cm MV A velocity: 78.45 cm/s  TRICUSPID VALVE                            TR Peak grad:   61.2 mmHg                            TR Vmax:        391.00 cm/s                             SHUNTS                            Systemic VTI:  0.16 m                            Systemic Diam: 2.40 cm Buford Dresser MD Electronically signed by Buford Dresser MD Signature Date/Time: 08/30/2019/12:48:37 PM    Final    VAS [place] LOWER EXTREMITY VENOUS (DVT)  Result Date: 08/30/2019  Lower Venous DVTStudy Indications: Pulmonary embolism, and SOB.  Comparison Study: No prior exam. Performing Technologist: Baldwin Crown ARDMS, RVT  Examination Guidelines: A complete evaluation includes B-mode imaging, spectral Doppler, color Doppler, and power Doppler as needed of all accessible portions of each vessel. Bilateral testing is considered an integral part of a complete examination. Limited examinations for reoccurring  indications may be performed as noted. The reflux portion of the exam is performed with the patient in reverse Trendelenburg.  +---------+---------------+---------+-----------+----------+---------------+ RIGHT    CompressibilityPhasicitySpontaneityPropertiesThrombus Aging  +---------+---------------+---------+-----------+----------+---------------+ CFV      Partial        Yes      Yes                                  +---------+---------------+---------+-----------+----------+---------------+ SFJ      None                                                         +---------+---------------+---------+-----------+----------+---------------+ FV Prox  Full                                                         +---------+---------------+---------+-----------+----------+---------------+ FV Mid   Full                                                         +---------+---------------+---------+-----------+----------+---------------+ FV DistalFull                                                         +---------+---------------+---------+-----------+----------+---------------+  PFV      Full                                                         +---------+---------------+---------+-----------+----------+---------------+ POP      Full           Yes      Yes                                  +---------+---------------+---------+-----------+----------+---------------+ PTV      Full                                         seen with color +---------+---------------+---------+-----------+----------+---------------+ PERO     Full                                         seen with color +---------+---------------+---------+-----------+----------+---------------+ EIV      Full           Yes      Yes                                  +---------+---------------+---------+-----------+----------+---------------+    +---------+---------------+---------+-----------+----------+---------------+ LEFT     CompressibilityPhasicitySpontaneityPropertiesThrombus Aging  +---------+---------------+---------+-----------+----------+---------------+ CFV      None           No       No                                   +---------+---------------+---------+-----------+----------+---------------+ SFJ      Full                                                         +---------+---------------+---------+-----------+----------+---------------+ FV Prox  Full                                                         +---------+---------------+---------+-----------+----------+---------------+ FV Mid   Full                                                         +---------+---------------+---------+-----------+----------+---------------+ FV DistalNone                                                         +---------+---------------+---------+-----------+----------+---------------+  PFV      Full                                                         +---------+---------------+---------+-----------+----------+---------------+ POP      None           No       No                                   +---------+---------------+---------+-----------+----------+---------------+ PTV      Full                                         seen with color +---------+---------------+---------+-----------+----------+---------------+ PERO     Full                                         seen with color +---------+---------------+---------+-----------+----------+---------------+ EIV      Full                                                         +---------+---------------+---------+-----------+----------+---------------+ Poorly visusalized bilteral calf veins, visualized with color flow.    Summary: RIGHT: - Findings consistent with acute deep vein thrombosis involving the SF junction, and right  common femoral vein. - Findings consistent with acute superficial vein thrombosis involving the right great saphenous vein.  LEFT: - Findings consistent with acute deep vein thrombosis involving the left common femoral vein, left femoral vein, and left popliteal vein.  *See table(s) above for measurements and observations. Electronically signed by Harold Barban MD on 08/30/2019 at 6:38:23 PM.    Final    US Abdomen Limited RUQ  Result Date: 09/10/2019 CLINICAL DATA:  Transaminitis EXAM: ULTRASOUND ABDOMEN LIMITED RIGHT UPPER QUADRANT COMPARISON:  None. FINDINGS: Gallbladder: No gallstones or wall thickening visualized. No sonographic Murphy sign noted by sonographer. Common bile duct: Diameter: 3.5 mm Liver: No focal lesion identified. Within normal limits in parenchymal echogenicity. Portal vein is patent on color Doppler imaging with normal direction of blood flow towards the liver. Other: None. IMPRESSION: Normal study. Electronically Signed   By: Constance Holster M.D.   On: 09/10/2019 19:29   IR INFUSION THROMBOL ARTERIAL INITIAL (MS)  Result Date: 08/31/2019 INDICATION: Sub massive pulmonary embolism. Request made for initiation of bilateral catheter directed pulmonary arterial thrombolysis. EXAM: 1. ULTRASOUND GUIDANCE FOR VENOUS ACCESS X2 2. PULMONARY ARTERIOGRAPHY 3. FLUOROSCOPIC GUIDED PLACEMENT OF BILATERAL PULMONARY ARTERIAL LYTIC INFUSION CATHETERS COMPARISON:  Chest CTA-earlier same day MEDICATIONS: None ANESTHESIA/SEDATION: Moderate (conscious) sedation was employed during this procedure. A total of Versed 2.5 mg and Fentanyl 100 mcg was administered intravenously. Moderate Sedation Time: 33 minutes. The patient's level of consciousness and vital signs were monitored continuously by radiology nursing throughout the procedure under my direct supervision. CONTRAST:  77m OMNIPAQUE IOHEXOL 300 MG/ML  SOLN FLUOROSCOPY TIME:  9 minutes, 18 seconds (1825mGy) COMPLICATIONS: None  immediate. TECHNIQUE:  Informed written consent was obtained from the patient after a discussion of the risks, benefits and alternatives to treatment. Questions regarding the procedure were encouraged and answered. A timeout was performed prior to the initiation of the procedure. Ultrasound scanning was performed of the right groin and demonstrated near occlusive thrombus within the right common femoral vein. Sonographic evaluation the left groin demonstrates wide patency of the left common femoral vein. As such, the left common femoral vein was selected venous access. The left groin was prepped and draped in the usual sterile fashion, and a sterile drape was applied covering the operative field. Maximum barrier sterile technique with sterile gowns and gloves were used for the procedure. A timeout was performed prior to the initiation of the procedure. Local anesthesia was provided with 1% lidocaine. Under direct ultrasound guidance, the right common femoral vein was accessed with a micro puncture kit ultimately allowing placement of a 6 French, 35 cm vascular sheath. Slightly cranial to this initial access, the right common femoral was again accessed with an additional micropuncture kit ultimately allowing placement of an additional 7 French, 35 cm vascular sheath. Ultrasound images were saved for procedural documentation purposes. With the use of a stiff glidewire, a vertebral catheter was advanced into the main pulmonary artery and a limited central pulmonary arteriogram was performed. Pressure measurements were then obtained from the main pulmonary artery. The vertebral catheter was advanced into the distal branch of the left lower lobe pulmonary artery. Limited contrast injection confirmed appropriate positioning. Over an exchange length Rosen wire, the vertebral catheter was exchanged for a 90/10 cm multi side-hole infusion catheter. Again, with the use of a stiff glidewire, a vertebral catheter was advanced into a distal branch of  the right lower lobe pulmonary artery. Limited contrast injection confirmed appropriate positioning. Over an exchange length Rosen wire, the pigtail catheter was exchanged for a 90/15 cm multi side-hole infusion catheter. A postprocedural fluoroscopic image was obtained to document final catheter positioning. Both vascular sheath were secured at the left groin. The external catheter tubing was secured at the right thigh and the lytic therapy was initiated. The patient tolerated the procedure well without immediate postprocedural complication. FINDINGS: Central pulmonary arteriogram demonstrates significant near occlusive thrombus involving the peripheral aspects of both the right and left main pulmonary arteries with marked dilatation of the central pulmonary arterial system. Additionally, limited right main pulmonary arteriogram demonstrates near occlusive thrombus within the peripheral aspect of the right main pulmonary artery. Acquired pressure measurements: Main  pulmonary artery-61/16; mean-40 (normal: < 25/10) Following the procedure, both ultrasound assisted infusion catheter tips terminate within the distal aspects of the bilateral lower lobe sub segmental pulmonary arteries. IMPRESSION: 1. Successful fluoroscopic guided initiation of bilateral ultrasound assisted catheter directed pulmonary arterial lysis for sub massive pulmonary embolism and right-sided heart strain. 2. Elevated pressure measurements within the main pulmonary artery compatible with critical pulmonary arterial hypertension. 3. Near occlusive DVT noted within the right common femoral vein. PLAN: Above findings discussed with Dr. Hart Robinsons (critical care) at the time of procedure completion and the decision made to proceed with IVC filter placement for temporary caval interruption purposes at the time of post lysis pressure measurement acquisition. Electronically Signed   By: Sandi Mariscal M.D.   On: 08/31/2019 08:24   IR INFUSION THROMBOL  ARTERIAL INITIAL (MS)  Result Date: 08/31/2019 INDICATION: Sub massive pulmonary embolism. Request made for initiation of bilateral catheter directed pulmonary arterial thrombolysis. EXAM: 1. ULTRASOUND GUIDANCE FOR VENOUS ACCESS  X2 2. PULMONARY ARTERIOGRAPHY 3. FLUOROSCOPIC GUIDED PLACEMENT OF BILATERAL PULMONARY ARTERIAL LYTIC INFUSION CATHETERS COMPARISON:  Chest CTA-earlier same day MEDICATIONS: None ANESTHESIA/SEDATION: Moderate (conscious) sedation was employed during this procedure. A total of Versed 2.5 mg and Fentanyl 100 mcg was administered intravenously. Moderate Sedation Time: 33 minutes. The patient's level of consciousness and vital signs were monitored continuously by radiology nursing throughout the procedure under my direct supervision. CONTRAST:  35m OMNIPAQUE IOHEXOL 300 MG/ML  SOLN FLUOROSCOPY TIME:  9 minutes, 18 seconds (1672mGy) COMPLICATIONS: None immediate. TECHNIQUE: Informed written consent was obtained from the patient after a discussion of the risks, benefits and alternatives to treatment. Questions regarding the procedure were encouraged and answered. A timeout was performed prior to the initiation of the procedure. Ultrasound scanning was performed of the right groin and demonstrated near occlusive thrombus within the right common femoral vein. Sonographic evaluation the left groin demonstrates wide patency of the left common femoral vein. As such, the left common femoral vein was selected venous access. The left groin was prepped and draped in the usual sterile fashion, and a sterile drape was applied covering the operative field. Maximum barrier sterile technique with sterile gowns and gloves were used for the procedure. A timeout was performed prior to the initiation of the procedure. Local anesthesia was provided with 1% lidocaine. Under direct ultrasound guidance, the right common femoral vein was accessed with a micro puncture kit ultimately allowing placement of a 6 French,  35 cm vascular sheath. Slightly cranial to this initial access, the right common femoral was again accessed with an additional micropuncture kit ultimately allowing placement of an additional 7 French, 35 cm vascular sheath. Ultrasound images were saved for procedural documentation purposes. With the use of a stiff glidewire, a vertebral catheter was advanced into the main pulmonary artery and a limited central pulmonary arteriogram was performed. Pressure measurements were then obtained from the main pulmonary artery. The vertebral catheter was advanced into the distal branch of the left lower lobe pulmonary artery. Limited contrast injection confirmed appropriate positioning. Over an exchange length Rosen wire, the vertebral catheter was exchanged for a 90/10 cm multi side-hole infusion catheter. Again, with the use of a stiff glidewire, a vertebral catheter was advanced into a distal branch of the right lower lobe pulmonary artery. Limited contrast injection confirmed appropriate positioning. Over an exchange length Rosen wire, the pigtail catheter was exchanged for a 90/15 cm multi side-hole infusion catheter. A postprocedural fluoroscopic image was obtained to document final catheter positioning. Both vascular sheath were secured at the left groin. The external catheter tubing was secured at the right thigh and the lytic therapy was initiated. The patient tolerated the procedure well without immediate postprocedural complication. FINDINGS: Central pulmonary arteriogram demonstrates significant near occlusive thrombus involving the peripheral aspects of both the right and left main pulmonary arteries with marked dilatation of the central pulmonary arterial system. Additionally, limited right main pulmonary arteriogram demonstrates near occlusive thrombus within the peripheral aspect of the right main pulmonary artery. Acquired pressure measurements: Main  pulmonary artery-61/16; mean-40 (normal: < 25/10)  Following the procedure, both ultrasound assisted infusion catheter tips terminate within the distal aspects of the bilateral lower lobe sub segmental pulmonary arteries. IMPRESSION: 1. Successful fluoroscopic guided initiation of bilateral ultrasound assisted catheter directed pulmonary arterial lysis for sub massive pulmonary embolism and right-sided heart strain. 2. Elevated pressure measurements within the main pulmonary artery compatible with critical pulmonary arterial hypertension. 3. Near occlusive DVT noted within the right common  femoral vein. PLAN: Above findings discussed with Dr. Hart Robinsons (critical care) at the time of procedure completion and the decision made to proceed with IVC filter placement for temporary caval interruption purposes at the time of post lysis pressure measurement acquisition. Electronically Signed   By: Sandi Mariscal M.D.   On: 08/31/2019 08:24   IR THROMB F/U EVAL ART/VEN FINAL DAY (MS)  Result Date: 08/31/2019 CLINICAL DATA:  Bilateral submassive pulmonary embolism with cor pulmonale. Status post thrombo lytic infusion via each pulmonary artery for 12 hours. Request has also been made to place an IVC filter on completion thrombo lytic therapy given residual DVT in both lower extremities. EXAM: 1. FINAL DAY ARTERIAL THROMBOLYTIC THERAPY PRESSURE MEASUREMENT. 2. IVC VENOGRAM. 3. PERCUTANEOUS IVC FILTER PLACEMENT. ANESTHESIA/SEDATION: 50 mcg IV Fentanyl. The patient was just given fentanyl and not formally sedated. CONTRAST:  41m OMNIPAQUE IOHEXOL 300 MG/ML SOLN, 122mOMNIPAQUE IOHEXOL 300 MG/ML SOLN, 1023mMNIPAQUE IOHEXOL 300 MG/ML SOLN FLUOROSCOPY TIME:  1 minutes and 30 seconds.  146.6 mGy. PROCEDURE: The procedure, risks, benefits, and alternatives were explained to the patient. Questions regarding the procedure were encouraged and answered. The patient understands and consents to the procedure. A time-out was performed prior to initiating the procedure. Pulmonary artery  pressures were directly measured through the pulmonary arterial infusion catheters. The catheters were then removed. Left femoral vein sheaths placed yesterday were prepped and draped. Local anesthesia was provided with 1% Lidocaine. A guidewire was advanced into the inferior vena cava via one of the sheaths. This sheath was removed and the venotomy dilated. A 10 French deployment sheath was advanced over the guidewire. This was utilized to perform IVC venography. The deployment sheath was further positioned in an appropriate location for filter deployment. A Bard Denali IVC filter was then advanced in the sheath. This was then fully deployed in the infrarenal IVC. Final filter position was confirmed with a fluoroscopic spot image. After the procedure the sheath was removed and hemostasis obtained with manual compression. COMPLICATIONS: None. FINDINGS: Measure pulmonary artery pressure is 55/20, mean 28 mm Hg compared to 61/16, 40 mm Hg prior to lytic therapy. IVC venography demonstrates a normal caliber IVC with no evidence of thrombus. Renal veins are identified bilaterally. The IVC filter was successfully positioned below the level of the renal veins and is appropriately oriented. This IVC filter has both permanent and retrievable indications. IMPRESSION: 1. Decrease in measured pulmonary artery pressure after 12 hours of bilateral pulmonary arterial thrombolytic therapy. 2. Placement of percutaneous IVC filter in infrarenal IVC. IVC venogram shows no evidence of IVC thrombus and normal caliber of the inferior vena cava. This filter does have both permanent and retrievable indications. PLAN: This IVC filter is potentially retrievable. The patient will be assessed for filter retrieval by Interventional Radiology in approximately 8-12 weeks. Further recommendations regarding filter retrieval, continued surveillance or declaration of device permanence, will be made at that time. Electronically Signed   By: GleAletta EdouardD.   On: 08/31/2019 14:25    Lab Data:  CBC: Recent Labs  Lab 09/10/19 1542 09/11/19 0317  WBC 14.2* 16.7*  NEUTROABS 12.3*  --   HGB 10.4* 11.0*  HCT 31.8* 32.8*  MCV 93.8 92.7  PLT 312 [id number] Metabolic Panel: Recent Labs  Lab 09/10/19 1542 09/10/19 2026 09/11/19 0317  NA 132*  --  139  K 3.3*  --  4.7  CL 101  --  105  CO2 23  --  24  GLUCOSE 124*  --  110*  BUN 17  --  15  CREATININE 0.92  --  0.88  CALCIUM 8.8*  --  8.9  MG  --  1.9  --    GFR: Estimated Creatinine Clearance: 90.3 mL/min (by C-G formula based on SCr of 0.88 mg/dL). Liver Function Tests: Recent Labs  Lab 09/10/19 1542 09/11/19 0317  AST 134* 70*  ALT 110* 89*  ALKPHOS 211* 173*  BILITOT 1.3* 1.3*  PROT 6.5 6.0*  ALBUMIN 2.3* 2.2*   Recent Labs  Lab 09/10/19 1542  LIPASE 31   No results for input(s): AMMONIA in the last 168 hours. Coagulation Profile: Recent Labs  Lab 09/10/19 1542  INR 2.7*   Cardiac Enzymes: No results for input(s): CKTOTAL, CKMB, CKMBINDEX, TROPONINI in the last 168 hours. BNP (last 3 results) No results for input(s): PROBNP in the last 8760 hours. HbA1C: No results for input(s): HGBA1C in the last 72 hours. CBG: No results for input(s): GLUCAP in the last 168 hours. Lipid Profile: No results for input(s): CHOL, HDL, LDLCALC, TRIG, CHOLHDL, LDLDIRECT in the last 72 hours. Thyroid Function Tests: No results for input(s): TSH, T4TOTAL, FREET4, T3FREE, THYROIDAB in the last 72 hours. Anemia Panel: No results for input(s): VITAMINB12, FOLATE, FERRITIN, TIBC, IRON, RETICCTPCT in the last 72 hours. Urine analysis: No results found for: COLORURINE, APPEARANCEUR, LABSPEC, PHURINE, GLUCOSEU, HGBUR, BILIRUBINUR, KETONESUR, PROTEINUR, UROBILINOGEN, NITRITE, LEUKOCYTESUR   Pio Eatherly M.D. [facility] 09/11/2019, 10:21 AM   Call night coverage person covering after 7pm

## 2019-09-11 NOTE — Progress Notes (Signed)
Echocardiogram 2D Echocardiogram has been performed.  Joe Cantu 09/11/2019, 1:25 PM

## 2019-09-11 NOTE — Consult Note (Addendum)
[place]  Telephone:(336) [phone] [phone]) [phone]  ID: Joe Cantu OB: 06-02-1951 MR#: [id number] VOZ#:[id number] PCP: Iona Beard, MD  CHIEF COMPLAINT: Recent diagnosis of massive PE and left lower extremity DVT now with hemoptysis while on Xarelto  INTERVAL HISTORY: Joe Cantu is a 69 year-old [place], man with a past medical history significant for PVCs, aortic root dilatation, recent diagnosis of acute massive PE with significant right heart dysfunction and left lower extremity DVT in February 2021.    He was hospitalized 08/30/2019 through 09/02/2019 for the PE and DVT.  With extensive questioning this appears to have been an unprovoked PE.  The right pulmonary artery was completely opacified, with no flow to the right lung, with significant clot in the left side as well.  He received catheter directed TPA and was placed on a heparin drip with transition to Xarelto.  He had a retrievable IVC filter placed on 08/31/2019 because there was residual clot in the legs  The patient did well at home after discharge although he was still "taking it slow".  On  09/09/2019 he developed a persistent cough and eventually hemoptysis.  He could not quantify how much blood he coughed up however per EMS report there was approximately 300 to 600 cc of blood in a juice jug.    In the ER, his alkaline phosphatase was 211, albumin 2.3, AST 134, ALT 110, WBC 14.2, hemoglobin 10.4, INR 2.7.  His Xarelto was placed on hold and pulmonology has been consulted.  They recommended a CT of the chest and possible bronchoscopy which was done today and shows improvement but persistence of the bilateral PEs, with no obvious cause of the bleeding.  Due to concern for possible aspiration during his coughing episodes, he is currently on IV antibiotics.    Note that during his last hospitalization, a hyper coag panel was sent which was unrevealing with the exception of low protein S activity at 49%, likely  reactive, heterozygosity for factor V Leiden.  We are asked to see the patient to make recommendations regarding anticoagulation in this patient with recent diagnosis of massive PE, left lower extremity DVT, and now with hemoptysis while on Xarelto.  REVIEW OF SYSTEMS: Joe Cantu reports that he is feeling better today.  He denies having any recurrent hemoptysis.  Reports that his appetite has been decreased. He denies recent fever and chills.  No headaches or dizziness.  Denies chest discomfort but reports some intermittent shortness of breath.  He denies abdominal pain, nausea, vomiting, constipation, diarrhea.  He has not noticed any epistaxis, hematuria, melena, hematochezia.  Denies lower extremity edema.  The remainder of a comprehensive review of systems is negative.  PAST MEDICAL HISTORY: Past Medical History:  Diagnosis Date   Aortic root dilatation ([place]) 02/21/2019   Dyspnea on exertion    Frequent unifocal PVCs 02/21/2019   Glaucoma    Gonorrhea    64 & 19 yrs of age; negative for syphillis, herpes and HIV   Left inguinal hernia    Pneumonia    Ventricular premature beats    Wears dentures    PAST SURGICAL HISTORY: Past Surgical History:  Procedure Laterality Date   COLONOSCOPY  07/2018   Benign polyps    HIATAL HERNIA REPAIR     IR ANGIOGRAM PULMONARY BILATERAL SELECTIVE  08/30/2019   IR INFUSION THROMBOL ARTERIAL INITIAL (MS)  08/30/2019   IR INFUSION THROMBOL ARTERIAL INITIAL (MS)  08/30/2019   IR IVC FILTER PLMT / S&I Burke Keels  GUID/MOD SED  08/31/2019   IR THROMB F/U EVAL ART/VEN FINAL DAY (MS)  08/31/2019   IR US GUIDE VASC ACCESS LEFT  08/30/2019   IR US GUIDE VASC ACCESS LEFT  08/30/2019   FAMILY HISTORY Family History  Problem Relation Age of Onset   Diabetes Mother    Cancer Father        colon   Diabetes Sister    Diabetes Brother    SOCIAL HISTORY: The patient is single.  He reports that he has 6 children who live throughout [place].  He lives with his fiance  Diane page, who works as a Visual merchandiser, Advertising account planner and children to school buses.  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  Denies history of alcohol use.  Ports that he previously smoked 1/2 pack/day of cigarettes for approximately 10 years but quit back in about 1977.  ADVANCED DIRECTIVES: The patient tells me his sister is Tarri Lakendrick is his healthcare power of attorney.  She can be reached at [phone].  HEALTH MAINTENANCE: Social History   Tobacco Use   Smoking status: Former Smoker    Packs/day: 0.50    Years: 10.00    Pack years: 5.00    Types: Cigarettes    Quit date: 1970    Years since quitting: 51.1   Smokeless tobacco: Never Used  Substance Use Topics   Alcohol use: Yes    Comment: occasional wine   Drug use: No   Colonoscopy: Reports history of colonoscopy about 5 years ago.  States that he had a polyp which was benign.  He is uncertain of the exact date. Bone density: Not performed Lipid panel: Reports completed by primary care provider with in the past year and that was normal.  No Known Allergies Current Facility-Administered Medications  Medication Dose Route Frequency Provider Last Rate Last Admin   acetaminophen (TYLENOL) tablet 650 mg  650 mg Oral Q6H PRN Rai, Ripudeep K, MD       Or   acetaminophen (TYLENOL) suppository 650 mg  650 mg Rectal Q6H PRN Rai, Ripudeep K, MD       Chlorhexidine Gluconate Cloth 2 % PADS 6 each  6 each Topical Daily Rai, Ripudeep K, MD   6 each at 09/11/19 0928   dorzolamide (TRUSOPT) 2 % ophthalmic solution 1 drop  1 drop Both Eyes BID Rai, Ripudeep K, MD   1 drop at 09/11/19 2951   HYDROcodone-acetaminophen (NORCO/VICODIN) 5-325 MG per tablet 1-2 tablet  1-2 tablet Oral Q4H PRN Rai, Ripudeep K, MD       latanoprost (XALATAN) 0.005 % ophthalmic solution 1 drop  1 drop Both Eyes Daily Rai, Ripudeep K, MD   1 drop at 09/11/19 0929   metoprolol tartrate (LOPRESSOR) tablet 12.5 mg  12.5 mg Oral  BID Rai, Ripudeep K, MD       ondansetron (ZOFRAN) tablet 4 mg  4 mg Oral Q6H PRN Rai, Ripudeep K, MD       Or   ondansetron (ZOFRAN) injection 4 mg  4 mg Intravenous Q6H PRN Rai, Ripudeep K, MD       pantoprazole (PROTONIX) injection 40 mg  40 mg Intravenous Q12H Rai, Ripudeep K, MD   40 mg at 09/11/19 0928   piperacillin-tazobactam (ZOSYN) IVPB 3.375 g  3.375 g Intravenous Q8H Angela Adam, [facility]   Stopped at 09/11/19 8841   sodium chloride (PF) 0.9 % injection  tamsulosin (FLOMAX) capsule 0.4 mg  0.4 mg Oral QHS Rai, Ripudeep K, MD   0.4 mg at 09/10/19 2217   timolol (TIMOPTIC) 0.5 % ophthalmic solution 1 drop  1 drop Both Eyes BID Rai, Ripudeep K, MD   1 drop at 09/11/19 0929   OBJECTIVE: Middle-aged African-American man examined in bed Vitals:   09/11/19 0600 09/11/19 0800  BP: (!) 114/53 118/69  Pulse:  95  Resp: (!) 28 (!) 33  Temp:  98.7 F (37.1 C)  SpO2:  100%   Body mass index is 21.3 kg/m. ECOG FS:1 - Symptomatic but completely ambulatory Ocular: Sclerae unicteric, pupils equal, round and reactive to light Ear-nose-throat: Oropharynx clear, dentition fair Lymphatic: No cervical or supraclavicular adenopathy Lungs no rales or rhonchi, good excursion bilaterally Heart regular rate and rhythm, no murmur appreciated Abd soft, nontender, positive bowel sounds MSK no focal spinal tenderness, no joint edema Neuro: non-focal, well-oriented, appropriate affect  LAB RESULTS: CMP     Component Value Date/Time   NA 139 09/11/2019 0317   K 4.7 09/11/2019 0317   CL 105 09/11/2019 0317   CO2 24 09/11/2019 0317   GLUCOSE 110 (H) 09/11/2019 0317   BUN 15 09/11/2019 0317   CREATININE 0.88 09/11/2019 0317   CALCIUM 8.9 09/11/2019 0317   PROT 6.0 (L) 09/11/2019 0317   ALBUMIN 2.2 (L) 09/11/2019 0317   AST 70 (H) 09/11/2019 0317   ALT 89 (H) 09/11/2019 0317   ALKPHOS 173 (H) 09/11/2019 0317   BILITOT 1.3 (H) 09/11/2019 0317   GFRNONAA >60 09/11/2019 0317   GFRAA  >60 09/11/2019 0317   INo results found for: SPEP, UPEP Lab Results  Component Value Date   WBC 16.7 (H) 09/11/2019   NEUTROABS 12.3 (H) 09/10/2019   HGB 11.0 (L) 09/11/2019   HCT 32.8 (L) 09/11/2019   MCV 92.7 09/11/2019   PLT 328 09/11/2019   '@LASTCHEMISTRY' @ No results found for: LABCA2 No components found for: LABCA125 Recent Labs  Lab 09/10/19 1542  INR 2.7*   Urinalysis No results found for: COLORURINE, APPEARANCEUR, LABSPEC, PHURINE, GLUCOSEU, HGBUR, BILIRUBINUR, KETONESUR, PROTEINUR, UROBILINOGEN, NITRITE, LEUKOCYTESUR STUDIES: DG Chest 2 View  Result Date: 09/10/2019 CLINICAL DATA:  Hemoptysis.  Recent PE. EXAM: CHEST - 2 VIEW COMPARISON:  08/30/2019 CT FINDINGS: Lateral view degraded by patient arm position. Midline trachea. Mild cardiomegaly. Pulmonary artery prominence. Right hemidiaphragm elevation. No pleural effusion or pneumothorax. Pulmonary interstitial prominence, favored to be due to AP portable technique. Subtle right lower lobe opacity is suspected, likely corresponding to infarct or atelectasis on prior CT. IMPRESSION: Cardiomegaly, without acute disease. Right base pulmonary opacity, similar to on the prior CT. Pulmonary artery enlargement, suggesting pulmonary arterial hypertension. Electronically Signed   By: Abigail Miyamoto M.D.   On: 09/10/2019 17:19   CT ANGIO CHEST PE W OR WO CONTRAST  Result Date: 09/11/2019 CLINICAL DATA:  History of pulmonary embolus. Hemoptysis. EXAM: CT ANGIOGRAPHY CHEST WITH CONTRAST TECHNIQUE: Multidetector CT imaging of the chest was performed using the standard protocol during bolus administration of intravenous contrast. Multiplanar CT image reconstructions and MIPs were obtained to evaluate the vascular anatomy. CONTRAST:  126m OMNIPAQUE IOHEXOL 350 MG/ML SOLN COMPARISON:  August 30, 2019. FINDINGS: Cardiovascular: There remains large filling defects in the main portions of both pulmonary arteries which may be slightly decreased  compared to prior exam. Mild cardiomegaly is noted. No pericardial effusion is noted. Atherosclerosis of thoracic aorta is noted without aneurysm or dissection. Mediastinum/Nodes: No enlarged mediastinal, hilar, or axillary  lymph nodes. Thyroid gland, trachea, and esophagus demonstrate no significant findings. Lungs/Pleura: No pneumothorax or pleural effusion is noted. Minimal bilateral posterior basilar subsegmental atelectasis is noted. Upper Abdomen: No acute abnormality. Musculoskeletal: No chest wall abnormality. No acute or significant osseous findings. Review of the MIP images confirms the above findings. IMPRESSION: There remains large bilateral pulmonary emboli in the main portions of the pulmonary arteries which are slightly decreased in size compared to prior exam. Aortic Atherosclerosis (ICD10-I70.0). Electronically Signed   By: Marijo Conception M.D.   On: 09/11/2019 10:34   CT Angio Chest PE W and/or Wo Contrast  Result Date: 08/30/2019 CLINICAL DATA:  Syncopal episode while using the bathroom. EXAM: CT ANGIOGRAPHY CHEST WITH CONTRAST TECHNIQUE: Multidetector CT imaging of the chest was performed using the standard protocol during bolus administration of intravenous contrast. Multiplanar CT image reconstructions and MIPs were obtained to evaluate the vascular anatomy. CONTRAST:  168m OMNIPAQUE IOHEXOL 350 MG/ML SOLN COMPARISON:  None. FINDINGS: Cardiovascular: Massive bilateral pulmonary arterial filling defects with near complete occlusion of the right pulmonary artery, no pulmonary arterial blood flow noted to the right lung. Large thrombus in the left lower lobe pulmonary artery which is partially occlusive. Marked right heart dilatation with contrast refluxing into the hepatic veins and IVC. Elevated RV to LV ratio of 2.16. Aortic atherosclerosis. Cannot assess for dissection given phase of contrast. Mild dilatation of the aortic root at 3.6 cm without aortic aneurysm. No pericardial effusion.  Mediastinum/Nodes: No adenopathy. No esophageal wall thickening. No suspicious thyroid nodule. Lungs/Pleura: Triangular subpleural opacities in the right upper and lower lobes likely pulmonary infarct. Diminished pulmonary vessels in the right lung related to pulmonary embolus. Subpleural opacities in the left lower lobe favor atelectasis. No pulmonary mass. No pulmonary edema. Upper Abdomen: Contrast refluxes into the hepatic veins and IVC. No other acute findings. Musculoskeletal: There are no acute or suspicious osseous abnormalities. Few bone islands in the right proximal humerus. Review of the MIP images confirms the above findings. IMPRESSION: 1. Massive pulmonary emboli with complete occlusion of the right main pulmonary artery and absent pulmonary arterial blood flow to the right lung. Large but partially occlusive filling defects in the left lower lobe pulmonary arteries. Significant right heart strain with RV to LV ratio of greater than 2, right heart dilatation and contrast refluxing into the hepatic veins and IVC. 2. Small pulmonary infarcts in the right upper and lower lobe. Critical Value/emergent results were called by telephone at the time of interpretation on 08/30/2019 at 6:24 am to Dr PAddison Lank, who verbally acknowledged these results. Electronically Signed   By: MKeith RakeM.D.   On: 08/30/2019 06:30   IR Angiogram Pulmonary Bilateral Selective  Result Date: 08/31/2019 INDICATION: Sub massive pulmonary embolism. Request made for initiation of bilateral catheter directed pulmonary arterial thrombolysis. EXAM: 1. ULTRASOUND GUIDANCE FOR VENOUS ACCESS X2 2. PULMONARY ARTERIOGRAPHY 3. FLUOROSCOPIC GUIDED PLACEMENT OF BILATERAL PULMONARY ARTERIAL LYTIC INFUSION CATHETERS COMPARISON:  Chest CTA-earlier same day MEDICATIONS: None ANESTHESIA/SEDATION: Moderate (conscious) sedation was employed during this procedure. A total of Versed 2.5 mg and Fentanyl 100 mcg was administered  intravenously. Moderate Sedation Time: 33 minutes. The patient's level of consciousness and vital signs were monitored continuously by radiology nursing throughout the procedure under my direct supervision. CONTRAST:  438mOMNIPAQUE IOHEXOL 300 MG/ML  SOLN FLUOROSCOPY TIME:  9 minutes, 18 seconds ([id number]) COMPLICATIONS: None immediate. TECHNIQUE: Informed written consent was obtained from the patient after a discussion of the risks,  benefits and alternatives to treatment. Questions regarding the procedure were encouraged and answered. A timeout was performed prior to the initiation of the procedure. Ultrasound scanning was performed of the right groin and demonstrated near occlusive thrombus within the right common femoral vein. Sonographic evaluation the left groin demonstrates wide patency of the left common femoral vein. As such, the left common femoral vein was selected venous access. The left groin was prepped and draped in the usual sterile fashion, and a sterile drape was applied covering the operative field. Maximum barrier sterile technique with sterile gowns and gloves were used for the procedure. A timeout was performed prior to the initiation of the procedure. Local anesthesia was provided with 1% lidocaine. Under direct ultrasound guidance, the right common femoral vein was accessed with a micro puncture kit ultimately allowing placement of a 6 French, 35 cm vascular sheath. Slightly cranial to this initial access, the right common femoral was again accessed with an additional micropuncture kit ultimately allowing placement of an additional 7 French, 35 cm vascular sheath. Ultrasound images were saved for procedural documentation purposes. With the use of a stiff glidewire, a vertebral catheter was advanced into the main pulmonary artery and a limited central pulmonary arteriogram was performed. Pressure measurements were then obtained from the main pulmonary artery. The vertebral catheter was advanced  into the distal branch of the left lower lobe pulmonary artery. Limited contrast injection confirmed appropriate positioning. Over an exchange length Rosen wire, the vertebral catheter was exchanged for a 90/10 cm multi side-hole infusion catheter. Again, with the use of a stiff glidewire, a vertebral catheter was advanced into a distal branch of the right lower lobe pulmonary artery. Limited contrast injection confirmed appropriate positioning. Over an exchange length Rosen wire, the pigtail catheter was exchanged for a 90/15 cm multi side-hole infusion catheter. A postprocedural fluoroscopic image was obtained to document final catheter positioning. Both vascular sheath were secured at the left groin. The external catheter tubing was secured at the right thigh and the lytic therapy was initiated. The patient tolerated the procedure well without immediate postprocedural complication. FINDINGS: Central pulmonary arteriogram demonstrates significant near occlusive thrombus involving the peripheral aspects of both the right and left main pulmonary arteries with marked dilatation of the central pulmonary arterial system. Additionally, limited right main pulmonary arteriogram demonstrates near occlusive thrombus within the peripheral aspect of the right main pulmonary artery. Acquired pressure measurements: Main  pulmonary artery-61/16; mean-40 (normal: < 25/10) Following the procedure, both ultrasound assisted infusion catheter tips terminate within the distal aspects of the bilateral lower lobe sub segmental pulmonary arteries. IMPRESSION: 1. Successful fluoroscopic guided initiation of bilateral ultrasound assisted catheter directed pulmonary arterial lysis for sub massive pulmonary embolism and right-sided heart strain. 2. Elevated pressure measurements within the main pulmonary artery compatible with critical pulmonary arterial hypertension. 3. Near occlusive DVT noted within the right common femoral vein. PLAN:  Above findings discussed with Dr. Hart Robinsons (critical care) at the time of procedure completion and the decision made to proceed with IVC filter placement for temporary caval interruption purposes at the time of post lysis pressure measurement acquisition. Electronically Signed   By: Sandi Mariscal M.D.   On: 08/31/2019 08:24   IR IVC FILTER PLMT / S&I Burke Keels GUID/MOD SED  Result Date: 08/31/2019 CLINICAL DATA:  Bilateral submassive pulmonary embolism with cor pulmonale. Status post thrombo lytic infusion via each pulmonary artery for 12 hours. Request has also been made to place an IVC filter on completion thrombo lytic therapy  given residual DVT in both lower extremities. EXAM: 1. FINAL DAY ARTERIAL THROMBOLYTIC THERAPY PRESSURE MEASUREMENT. 2. IVC VENOGRAM. 3. PERCUTANEOUS IVC FILTER PLACEMENT. ANESTHESIA/SEDATION: 50 mcg IV Fentanyl. The patient was just given fentanyl and not formally sedated. CONTRAST:  3m OMNIPAQUE IOHEXOL 300 MG/ML SOLN, 13mOMNIPAQUE IOHEXOL 300 MG/ML SOLN, 1028mMNIPAQUE IOHEXOL 300 MG/ML SOLN FLUOROSCOPY TIME:  1 minutes and 30 seconds.  146.6 mGy. PROCEDURE: The procedure, risks, benefits, and alternatives were explained to the patient. Questions regarding the procedure were encouraged and answered. The patient understands and consents to the procedure. A time-out was performed prior to initiating the procedure. Pulmonary artery pressures were directly measured through the pulmonary arterial infusion catheters. The catheters were then removed. Left femoral vein sheaths placed yesterday were prepped and draped. Local anesthesia was provided with 1% Lidocaine. A guidewire was advanced into the inferior vena cava via one of the sheaths. This sheath was removed and the venotomy dilated. A 10 French deployment sheath was advanced over the guidewire. This was utilized to perform IVC venography. The deployment sheath was further positioned in an appropriate location for filter deployment. A  Bard Denali IVC filter was then advanced in the sheath. This was then fully deployed in the infrarenal IVC. Final filter position was confirmed with a fluoroscopic spot image. After the procedure the sheath was removed and hemostasis obtained with manual compression. COMPLICATIONS: None. FINDINGS: Measure pulmonary artery pressure is 55/20, mean 28 mm Hg compared to 61/16, 40 mm Hg prior to lytic therapy. IVC venography demonstrates a normal caliber IVC with no evidence of thrombus. Renal veins are identified bilaterally. The IVC filter was successfully positioned below the level of the renal veins and is appropriately oriented. This IVC filter has both permanent and retrievable indications. IMPRESSION: 1. Decrease in measured pulmonary artery pressure after 12 hours of bilateral pulmonary arterial thrombolytic therapy. 2. Placement of percutaneous IVC filter in infrarenal IVC. IVC venogram shows no evidence of IVC thrombus and normal caliber of the inferior vena cava. This filter does have both permanent and retrievable indications. PLAN: This IVC filter is potentially retrievable. The patient will be assessed for filter retrieval by Interventional Radiology in approximately 8-12 weeks. Further recommendations regarding filter retrieval, continued surveillance or declaration of device permanence, will be made at that time. Electronically Signed   By: GleAletta EdouardD.   On: 08/31/2019 14:25   IR US [place] Vasc Access Left  Result Date: 08/31/2019 INDICATION: Sub massive pulmonary embolism. Request made for initiation of bilateral catheter directed pulmonary arterial thrombolysis. EXAM: 1. ULTRASOUND GUIDANCE FOR VENOUS ACCESS X2 2. PULMONARY ARTERIOGRAPHY 3. FLUOROSCOPIC GUIDED PLACEMENT OF BILATERAL PULMONARY ARTERIAL LYTIC INFUSION CATHETERS COMPARISON:  Chest CTA-earlier same day MEDICATIONS: None ANESTHESIA/SEDATION: Moderate (conscious) sedation was employed during this procedure. A total of Versed 2.5  mg and Fentanyl 100 mcg was administered intravenously. Moderate Sedation Time: 33 minutes. The patient's level of consciousness and vital signs were monitored continuously by radiology nursing throughout the procedure under my direct supervision. CONTRAST:  63m72mNIPAQUE IOHEXOL 300 MG/ML  SOLN FLUOROSCOPY TIME:  9 minutes, 18 seconds ([id number]) COMPLICATIONS: None immediate. TECHNIQUE: Informed written consent was obtained from the patient after a discussion of the risks, benefits and alternatives to treatment. Questions regarding the procedure were encouraged and answered. A timeout was performed prior to the initiation of the procedure. Ultrasound scanning was performed of the right groin and demonstrated near occlusive thrombus within the right common femoral vein. Sonographic evaluation the left groin demonstrates  wide patency of the left common femoral vein. As such, the left common femoral vein was selected venous access. The left groin was prepped and draped in the usual sterile fashion, and a sterile drape was applied covering the operative field. Maximum barrier sterile technique with sterile gowns and gloves were used for the procedure. A timeout was performed prior to the initiation of the procedure. Local anesthesia was provided with 1% lidocaine. Under direct ultrasound guidance, the right common femoral vein was accessed with a micro puncture kit ultimately allowing placement of a 6 French, 35 cm vascular sheath. Slightly cranial to this initial access, the right common femoral was again accessed with an additional micropuncture kit ultimately allowing placement of an additional 7 French, 35 cm vascular sheath. Ultrasound images were saved for procedural documentation purposes. With the use of a stiff glidewire, a vertebral catheter was advanced into the main pulmonary artery and a limited central pulmonary arteriogram was performed. Pressure measurements were then obtained from the main pulmonary  artery. The vertebral catheter was advanced into the distal branch of the left lower lobe pulmonary artery. Limited contrast injection confirmed appropriate positioning. Over an exchange length Rosen wire, the vertebral catheter was exchanged for a 90/10 cm multi side-hole infusion catheter. Again, with the use of a stiff glidewire, a vertebral catheter was advanced into a distal branch of the right lower lobe pulmonary artery. Limited contrast injection confirmed appropriate positioning. Over an exchange length Rosen wire, the pigtail catheter was exchanged for a 90/15 cm multi side-hole infusion catheter. A postprocedural fluoroscopic image was obtained to document final catheter positioning. Both vascular sheath were secured at the left groin. The external catheter tubing was secured at the right thigh and the lytic therapy was initiated. The patient tolerated the procedure well without immediate postprocedural complication. FINDINGS: Central pulmonary arteriogram demonstrates significant near occlusive thrombus involving the peripheral aspects of both the right and left main pulmonary arteries with marked dilatation of the central pulmonary arterial system. Additionally, limited right main pulmonary arteriogram demonstrates near occlusive thrombus within the peripheral aspect of the right main pulmonary artery. Acquired pressure measurements: Main  pulmonary artery-61/16; mean-40 (normal: < 25/10) Following the procedure, both ultrasound assisted infusion catheter tips terminate within the distal aspects of the bilateral lower lobe sub segmental pulmonary arteries. IMPRESSION: 1. Successful fluoroscopic guided initiation of bilateral ultrasound assisted catheter directed pulmonary arterial lysis for sub massive pulmonary embolism and right-sided heart strain. 2. Elevated pressure measurements within the main pulmonary artery compatible with critical pulmonary arterial hypertension. 3. Near occlusive DVT noted  within the right common femoral vein. PLAN: Above findings discussed with Dr. Hart Robinsons (critical care) at the time of procedure completion and the decision made to proceed with IVC filter placement for temporary caval interruption purposes at the time of post lysis pressure measurement acquisition. Electronically Signed   By: Sandi Mariscal M.D.   On: 08/31/2019 08:24   IR US Guide Vasc Access Left  Result Date: 08/31/2019 INDICATION: Sub massive pulmonary embolism. Request made for initiation of bilateral catheter directed pulmonary arterial thrombolysis. EXAM: 1. ULTRASOUND GUIDANCE FOR VENOUS ACCESS X2 2. PULMONARY ARTERIOGRAPHY 3. FLUOROSCOPIC GUIDED PLACEMENT OF BILATERAL PULMONARY ARTERIAL LYTIC INFUSION CATHETERS COMPARISON:  Chest CTA-earlier same day MEDICATIONS: None ANESTHESIA/SEDATION: Moderate (conscious) sedation was employed during this procedure. A total of Versed 2.5 mg and Fentanyl 100 mcg was administered intravenously. Moderate Sedation Time: 33 minutes. The patient's level of consciousness and vital signs were monitored continuously by radiology nursing throughout the  procedure under my direct supervision. CONTRAST:  71m OMNIPAQUE IOHEXOL 300 MG/ML  SOLN FLUOROSCOPY TIME:  9 minutes, 18 seconds (1277mGy) COMPLICATIONS: None immediate. TECHNIQUE: Informed written consent was obtained from the patient after a discussion of the risks, benefits and alternatives to treatment. Questions regarding the procedure were encouraged and answered. A timeout was performed prior to the initiation of the procedure. Ultrasound scanning was performed of the right groin and demonstrated near occlusive thrombus within the right common femoral vein. Sonographic evaluation the left groin demonstrates wide patency of the left common femoral vein. As such, the left common femoral vein was selected venous access. The left groin was prepped and draped in the usual sterile fashion, and a sterile drape was applied covering  the operative field. Maximum barrier sterile technique with sterile gowns and gloves were used for the procedure. A timeout was performed prior to the initiation of the procedure. Local anesthesia was provided with 1% lidocaine. Under direct ultrasound guidance, the right common femoral vein was accessed with a micro puncture kit ultimately allowing placement of a 6 French, 35 cm vascular sheath. Slightly cranial to this initial access, the right common femoral was again accessed with an additional micropuncture kit ultimately allowing placement of an additional 7 French, 35 cm vascular sheath. Ultrasound images were saved for procedural documentation purposes. With the use of a stiff glidewire, a vertebral catheter was advanced into the main pulmonary artery and a limited central pulmonary arteriogram was performed. Pressure measurements were then obtained from the main pulmonary artery. The vertebral catheter was advanced into the distal branch of the left lower lobe pulmonary artery. Limited contrast injection confirmed appropriate positioning. Over an exchange length Rosen wire, the vertebral catheter was exchanged for a 90/10 cm multi side-hole infusion catheter. Again, with the use of a stiff glidewire, a vertebral catheter was advanced into a distal branch of the right lower lobe pulmonary artery. Limited contrast injection confirmed appropriate positioning. Over an exchange length Rosen wire, the pigtail catheter was exchanged for a 90/15 cm multi side-hole infusion catheter. A postprocedural fluoroscopic image was obtained to document final catheter positioning. Both vascular sheath were secured at the left groin. The external catheter tubing was secured at the right thigh and the lytic therapy was initiated. The patient tolerated the procedure well without immediate postprocedural complication. FINDINGS: Central pulmonary arteriogram demonstrates significant near occlusive thrombus involving the peripheral  aspects of both the right and left main pulmonary arteries with marked dilatation of the central pulmonary arterial system. Additionally, limited right main pulmonary arteriogram demonstrates near occlusive thrombus within the peripheral aspect of the right main pulmonary artery. Acquired pressure measurements: Main  pulmonary artery-61/16; mean-40 (normal: < 25/10) Following the procedure, both ultrasound assisted infusion catheter tips terminate within the distal aspects of the bilateral lower lobe sub segmental pulmonary arteries. IMPRESSION: 1. Successful fluoroscopic guided initiation of bilateral ultrasound assisted catheter directed pulmonary arterial lysis for sub massive pulmonary embolism and right-sided heart strain. 2. Elevated pressure measurements within the main pulmonary artery compatible with critical pulmonary arterial hypertension. 3. Near occlusive DVT noted within the right common femoral vein. PLAN: Above findings discussed with Dr. PHart Robinsons(critical care) at the time of procedure completion and the decision made to proceed with IVC filter placement for temporary caval interruption purposes at the time of post lysis pressure measurement acquisition. Electronically Signed   By: JSandi MariscalM.D.   On: 08/31/2019 08:24   ECHOCARDIOGRAM COMPLETE  Result Date: 08/30/2019  ECHOCARDIOGRAM REPORT   Patient Name:   Joe Cantu Date of Exam: 08/30/2019 Medical Rec #:  [id number]     Height:       75.0 in Accession #:    [id number]    Weight:       172.0 lb Date of Birth:  [birth year]     BSA:          2.06 m Patient Age:    84 years      BP:           122/87 mmHg Patient Gender: M             HR:           98 bpm. Exam Location:  Inpatient Procedure: 2D Echo, Cardiac Doppler and Color Doppler STAT ECHO Indications:    R55 Syncope  History:        Patient has no prior history of Echocardiogram examinations.                 Aortic Rood Dilatation.  Sonographer:    Jonelle Sidle Dance Referring Phys: [id number]  [place]  1. Severe RA/RV dilation with reduced RV function and severe pulmonary hypertension. LV hyperdynamic and borderline small cavity/underfilled. No hemodynamically significant valve disease.  2. Left ventricular ejection fraction, by estimation, is 65 to 70%. The left ventricle has hyperdynamic function. The left ventricle has no regional wall motion abnormalities. There is moderate concentric left ventricular hypertrophy. Left ventricular diastolic function could not be evaluated. There is the interventricular septum is flattened in systole and diastole, consistent with right ventricular pressure and volume overload.  3. Right ventricular systolic function is moderately reduced. The right ventricular size is severely enlarged. There is severely elevated pulmonary artery systolic pressure. The estimated right ventricular systolic pressure is 76.8 mmHg.  4. Right atrial size was severely dilated.  5. The mitral valve is normal in structure and function. Trivial mitral valve regurgitation. No evidence of mitral stenosis.  6. Tricuspid valve regurgitation is moderate.  7. The aortic valve is tricuspid. Aortic valve regurgitation is trivial. No aortic stenosis is present.  8. Aortic dilatation noted. There is mild dilatation of the ascending aorta measuring 37 mm.  9. Mildly dilated pulmonary artery. 10. The inferior vena cava is dilated in size with <50% respiratory variability, suggesting right atrial pressure of 15 mmHg. 11. Cannot exclude IAS shunt. Comparison(s): No prior Echocardiogram. FINDINGS  Left Ventricle: Left ventricular ejection fraction, by estimation, is 65 to 70%. The left ventricle has hyperdynamic function. The left ventricle has no regional wall motion abnormalities. The left ventricular internal cavity size was small. There is moderate concentric left ventricular hypertrophy. The interventricular septum is flattened in systole and diastole, consistent with right  ventricular pressure and volume overload. Left ventricular diastolic function could not be evaluated. Right Ventricle: The right ventricular size is severely enlarged. Right vetricular wall thickness was not assessed. Right ventricular systolic function is moderately reduced. There is severely elevated pulmonary artery systolic pressure. The tricuspid regurgitant velocity is 3.91 m/s, and with an assumed right atrial pressure of 15 mmHg, the estimated right ventricular systolic pressure is 11.5 mmHg. Left Atrium: Left atrial size was normal in size. Right Atrium: Right atrial size was severely dilated. Pericardium: There is no evidence of pericardial effusion. Mitral Valve: The mitral valve is normal in structure and function. Trivial mitral valve regurgitation. No evidence of mitral valve stenosis. Tricuspid Valve: The tricuspid valve is normal in structure. Tricuspid  valve regurgitation is moderate . No evidence of tricuspid stenosis. Aortic Valve: The aortic valve is tricuspid. Aortic valve regurgitation is trivial. No aortic stenosis is present. Pulmonic Valve: The pulmonic valve was grossly normal. Pulmonic valve regurgitation is mild. No evidence of pulmonic stenosis. Aorta: Aortic dilatation noted. There is mild dilatation of the ascending aorta measuring 37 mm. Pulmonary Artery: The pulmonary artery is mildly dilated. Venous: The inferior vena cava is dilated in size with less than 50% respiratory variability, suggesting right atrial pressure of 15 mmHg. IAS/Shunts: Cannot exclude IAS shunt. Additional Comments: There is a small pleural effusion in both left and right lateral regions.  LEFT VENTRICLE PLAX 2D LVIDd:         3.33 cm LVIDs:         2.20 cm LV PW:         1.75 cm LV IVS:        2.02 cm LVOT diam:     2.40 cm LV SV:         72.61 ml LV SV Index:   14.28 LVOT Area:     4.52 cm  RIGHT VENTRICLE          IVC RV Basal diam:  4.20 cm  IVC diam: 2.20 cm RV Mid diam:    3.00 cm TAPSE (M-mode): 1.4 cm  LEFT ATRIUM             Index       RIGHT ATRIUM           Index LA diam:        3.40 cm 1.65 cm/m  RA Area:     39.50 cm LA Vol (A2C):   42.1 ml 20.46 ml/m RA Volume:   182.50 ml 88.67 ml/m LA Vol (A4C):   43.0 ml 20.89 ml/m LA Biplane Vol: 42.4 ml 20.60 ml/m  AORTIC VALVE LVOT Vmax:   102.40 cm/s LVOT Vmean:  57.500 cm/s LVOT VTI:    0.160 m  AORTA Ao Root diam: 4.20 cm Ao Asc diam:  3.70 cm MV A velocity: 78.45 cm/s  TRICUSPID VALVE                            TR Peak grad:   61.2 mmHg                            TR Vmax:        391.00 cm/s                             SHUNTS                            Systemic VTI:  0.16 m                            Systemic Diam: 2.40 cm Buford Dresser MD Electronically signed by Buford Dresser MD Signature Date/Time: 08/30/2019/12:48:37 PM    Final    VAS [place] LOWER EXTREMITY VENOUS (DVT)  Result Date: 08/30/2019  Lower Venous DVTStudy Indications: Pulmonary embolism, and SOB.  Comparison Study: No prior exam. Performing Technologist: Baldwin Crown ARDMS, RVT  Examination Guidelines: A complete evaluation includes B-mode imaging, spectral Doppler, color Doppler, and power Doppler as needed of all accessible portions of  each vessel. Bilateral testing is considered an integral part of a complete examination. Limited examinations for reoccurring indications may be performed as noted. The reflux portion of the exam is performed with the patient in reverse Trendelenburg.  +---------+---------------+---------+-----------+----------+---------------+ RIGHT    CompressibilityPhasicitySpontaneityPropertiesThrombus Aging  +---------+---------------+---------+-----------+----------+---------------+ CFV      Partial        Yes      Yes                                  +---------+---------------+---------+-----------+----------+---------------+ SFJ      None                                                          +---------+---------------+---------+-----------+----------+---------------+ FV Prox  Full                                                         +---------+---------------+---------+-----------+----------+---------------+ FV Mid   Full                                                         +---------+---------------+---------+-----------+----------+---------------+ FV DistalFull                                                         +---------+---------------+---------+-----------+----------+---------------+ PFV      Full                                                         +---------+---------------+---------+-----------+----------+---------------+ POP      Full           Yes      Yes                                  +---------+---------------+---------+-----------+----------+---------------+ PTV      Full                                         seen with color +---------+---------------+---------+-----------+----------+---------------+ PERO     Full                                         seen with color +---------+---------------+---------+-----------+----------+---------------+ EIV      Full           Yes      Yes                                  +---------+---------------+---------+-----------+----------+---------------+   +---------+---------------+---------+-----------+----------+---------------+  LEFT     CompressibilityPhasicitySpontaneityPropertiesThrombus Aging  +---------+---------------+---------+-----------+----------+---------------+ CFV      None           No       No                                   +---------+---------------+---------+-----------+----------+---------------+ SFJ      Full                                                         +---------+---------------+---------+-----------+----------+---------------+ FV Prox  Full                                                          +---------+---------------+---------+-----------+----------+---------------+ FV Mid   Full                                                         +---------+---------------+---------+-----------+----------+---------------+ FV DistalNone                                                         +---------+---------------+---------+-----------+----------+---------------+ PFV      Full                                                         +---------+---------------+---------+-----------+----------+---------------+ POP      None           No       No                                   +---------+---------------+---------+-----------+----------+---------------+ PTV      Full                                         seen with color +---------+---------------+---------+-----------+----------+---------------+ PERO     Full                                         seen with color +---------+---------------+---------+-----------+----------+---------------+ EIV      Full                                                         +---------+---------------+---------+-----------+----------+---------------+  Poorly visusalized bilteral calf veins, visualized with color flow.    Summary: RIGHT: - Findings consistent with acute deep vein thrombosis involving the SF junction, and right common femoral vein. - Findings consistent with acute superficial vein thrombosis involving the right great saphenous vein.  LEFT: - Findings consistent with acute deep vein thrombosis involving the left common femoral vein, left femoral vein, and left popliteal vein.  *See table(s) above for measurements and observations. Electronically signed by Harold Barban MD on 08/30/2019 at 6:38:23 PM.    Final    US Abdomen Limited RUQ  Result Date: 09/10/2019 CLINICAL DATA:  Transaminitis EXAM: ULTRASOUND ABDOMEN LIMITED RIGHT UPPER QUADRANT COMPARISON:  None. FINDINGS: Gallbladder: No gallstones or wall  thickening visualized. No sonographic Murphy sign noted by sonographer. Common bile duct: Diameter: 3.5 mm Liver: No focal lesion identified. Within normal limits in parenchymal echogenicity. Portal vein is patent on color Doppler imaging with normal direction of blood flow towards the liver. Other: None. IMPRESSION: Normal study. Electronically Signed   By: Constance Holster M.D.   On: 09/10/2019 19:29   IR INFUSION THROMBOL ARTERIAL INITIAL (MS)  Result Date: 08/31/2019 INDICATION: Sub massive pulmonary embolism. Request made for initiation of bilateral catheter directed pulmonary arterial thrombolysis. EXAM: 1. ULTRASOUND GUIDANCE FOR VENOUS ACCESS X2 2. PULMONARY ARTERIOGRAPHY 3. FLUOROSCOPIC GUIDED PLACEMENT OF BILATERAL PULMONARY ARTERIAL LYTIC INFUSION CATHETERS COMPARISON:  Chest CTA-earlier same day MEDICATIONS: None ANESTHESIA/SEDATION: Moderate (conscious) sedation was employed during this procedure. A total of Versed 2.5 mg and Fentanyl 100 mcg was administered intravenously. Moderate Sedation Time: 33 minutes. The patient's level of consciousness and vital signs were monitored continuously by radiology nursing throughout the procedure under my direct supervision. CONTRAST:  37m OMNIPAQUE IOHEXOL 300 MG/ML  SOLN FLUOROSCOPY TIME:  9 minutes, 18 seconds (1250mGy) COMPLICATIONS: None immediate. TECHNIQUE: Informed written consent was obtained from the patient after a discussion of the risks, benefits and alternatives to treatment. Questions regarding the procedure were encouraged and answered. A timeout was performed prior to the initiation of the procedure. Ultrasound scanning was performed of the right groin and demonstrated near occlusive thrombus within the right common femoral vein. Sonographic evaluation the left groin demonstrates wide patency of the left common femoral vein. As such, the left common femoral vein was selected venous access. The left groin was prepped and draped in the usual  sterile fashion, and a sterile drape was applied covering the operative field. Maximum barrier sterile technique with sterile gowns and gloves were used for the procedure. A timeout was performed prior to the initiation of the procedure. Local anesthesia was provided with 1% lidocaine. Under direct ultrasound guidance, the right common femoral vein was accessed with a micro puncture kit ultimately allowing placement of a 6 French, 35 cm vascular sheath. Slightly cranial to this initial access, the right common femoral was again accessed with an additional micropuncture kit ultimately allowing placement of an additional 7 French, 35 cm vascular sheath. Ultrasound images were saved for procedural documentation purposes. With the use of a stiff glidewire, a vertebral catheter was advanced into the main pulmonary artery and a limited central pulmonary arteriogram was performed. Pressure measurements were then obtained from the main pulmonary artery. The vertebral catheter was advanced into the distal branch of the left lower lobe pulmonary artery. Limited contrast injection confirmed appropriate positioning. Over an exchange length Rosen wire, the vertebral catheter was exchanged for a 90/10 cm multi side-hole infusion catheter. Again, with the use of a stiff glidewire, a  vertebral catheter was advanced into a distal branch of the right lower lobe pulmonary artery. Limited contrast injection confirmed appropriate positioning. Over an exchange length Rosen wire, the pigtail catheter was exchanged for a 90/15 cm multi side-hole infusion catheter. A postprocedural fluoroscopic image was obtained to document final catheter positioning. Both vascular sheath were secured at the left groin. The external catheter tubing was secured at the right thigh and the lytic therapy was initiated. The patient tolerated the procedure well without immediate postprocedural complication. FINDINGS: Central pulmonary arteriogram demonstrates  significant near occlusive thrombus involving the peripheral aspects of both the right and left main pulmonary arteries with marked dilatation of the central pulmonary arterial system. Additionally, limited right main pulmonary arteriogram demonstrates near occlusive thrombus within the peripheral aspect of the right main pulmonary artery. Acquired pressure measurements: Main  pulmonary artery-61/16; mean-40 (normal: < 25/10) Following the procedure, both ultrasound assisted infusion catheter tips terminate within the distal aspects of the bilateral lower lobe sub segmental pulmonary arteries. IMPRESSION: 1. Successful fluoroscopic guided initiation of bilateral ultrasound assisted catheter directed pulmonary arterial lysis for sub massive pulmonary embolism and right-sided heart strain. 2. Elevated pressure measurements within the main pulmonary artery compatible with critical pulmonary arterial hypertension. 3. Near occlusive DVT noted within the right common femoral vein. PLAN: Above findings discussed with Dr. Hart Robinsons (critical care) at the time of procedure completion and the decision made to proceed with IVC filter placement for temporary caval interruption purposes at the time of post lysis pressure measurement acquisition. Electronically Signed   By: Sandi Mariscal M.D.   On: 08/31/2019 08:24   IR INFUSION THROMBOL ARTERIAL INITIAL (MS)  Result Date: 08/31/2019 INDICATION: Sub massive pulmonary embolism. Request made for initiation of bilateral catheter directed pulmonary arterial thrombolysis. EXAM: 1. ULTRASOUND GUIDANCE FOR VENOUS ACCESS X2 2. PULMONARY ARTERIOGRAPHY 3. FLUOROSCOPIC GUIDED PLACEMENT OF BILATERAL PULMONARY ARTERIAL LYTIC INFUSION CATHETERS COMPARISON:  Chest CTA-earlier same day MEDICATIONS: None ANESTHESIA/SEDATION: Moderate (conscious) sedation was employed during this procedure. A total of Versed 2.5 mg and Fentanyl 100 mcg was administered intravenously. Moderate Sedation Time: 33  minutes. The patient's level of consciousness and vital signs were monitored continuously by radiology nursing throughout the procedure under my direct supervision. CONTRAST:  89m OMNIPAQUE IOHEXOL 300 MG/ML  SOLN FLUOROSCOPY TIME:  9 minutes, 18 seconds (1175mGy) COMPLICATIONS: None immediate. TECHNIQUE: Informed written consent was obtained from the patient after a discussion of the risks, benefits and alternatives to treatment. Questions regarding the procedure were encouraged and answered. A timeout was performed prior to the initiation of the procedure. Ultrasound scanning was performed of the right groin and demonstrated near occlusive thrombus within the right common femoral vein. Sonographic evaluation the left groin demonstrates wide patency of the left common femoral vein. As such, the left common femoral vein was selected venous access. The left groin was prepped and draped in the usual sterile fashion, and a sterile drape was applied covering the operative field. Maximum barrier sterile technique with sterile gowns and gloves were used for the procedure. A timeout was performed prior to the initiation of the procedure. Local anesthesia was provided with 1% lidocaine. Under direct ultrasound guidance, the right common femoral vein was accessed with a micro puncture kit ultimately allowing placement of a 6 French, 35 cm vascular sheath. Slightly cranial to this initial access, the right common femoral was again accessed with an additional micropuncture kit ultimately allowing placement of an additional 7 French, 35 cm vascular sheath. Ultrasound images were  saved for procedural documentation purposes. With the use of a stiff glidewire, a vertebral catheter was advanced into the main pulmonary artery and a limited central pulmonary arteriogram was performed. Pressure measurements were then obtained from the main pulmonary artery. The vertebral catheter was advanced into the distal branch of the left lower  lobe pulmonary artery. Limited contrast injection confirmed appropriate positioning. Over an exchange length Rosen wire, the vertebral catheter was exchanged for a 90/10 cm multi side-hole infusion catheter. Again, with the use of a stiff glidewire, a vertebral catheter was advanced into a distal branch of the right lower lobe pulmonary artery. Limited contrast injection confirmed appropriate positioning. Over an exchange length Rosen wire, the pigtail catheter was exchanged for a 90/15 cm multi side-hole infusion catheter. A postprocedural fluoroscopic image was obtained to document final catheter positioning. Both vascular sheath were secured at the left groin. The external catheter tubing was secured at the right thigh and the lytic therapy was initiated. The patient tolerated the procedure well without immediate postprocedural complication. FINDINGS: Central pulmonary arteriogram demonstrates significant near occlusive thrombus involving the peripheral aspects of both the right and left main pulmonary arteries with marked dilatation of the central pulmonary arterial system. Additionally, limited right main pulmonary arteriogram demonstrates near occlusive thrombus within the peripheral aspect of the right main pulmonary artery. Acquired pressure measurements: Main  pulmonary artery-61/16; mean-40 (normal: < 25/10) Following the procedure, both ultrasound assisted infusion catheter tips terminate within the distal aspects of the bilateral lower lobe sub segmental pulmonary arteries. IMPRESSION: 1. Successful fluoroscopic guided initiation of bilateral ultrasound assisted catheter directed pulmonary arterial lysis for sub massive pulmonary embolism and right-sided heart strain. 2. Elevated pressure measurements within the main pulmonary artery compatible with critical pulmonary arterial hypertension. 3. Near occlusive DVT noted within the right common femoral vein. PLAN: Above findings discussed with Dr. Hart Robinsons  (critical care) at the time of procedure completion and the decision made to proceed with IVC filter placement for temporary caval interruption purposes at the time of post lysis pressure measurement acquisition. Electronically Signed   By: Sandi Mariscal M.D.   On: 08/31/2019 08:24   IR THROMB F/U EVAL ART/VEN FINAL DAY (MS)  Result Date: 08/31/2019 CLINICAL DATA:  Bilateral submassive pulmonary embolism with cor pulmonale. Status post thrombo lytic infusion via each pulmonary artery for 12 hours. Request has also been made to place an IVC filter on completion thrombo lytic therapy given residual DVT in both lower extremities. EXAM: 1. FINAL DAY ARTERIAL THROMBOLYTIC THERAPY PRESSURE MEASUREMENT. 2. IVC VENOGRAM. 3. PERCUTANEOUS IVC FILTER PLACEMENT. ANESTHESIA/SEDATION: 50 mcg IV Fentanyl. The patient was just given fentanyl and not formally sedated. CONTRAST:  49m OMNIPAQUE IOHEXOL 300 MG/ML SOLN, 128mOMNIPAQUE IOHEXOL 300 MG/ML SOLN, 103mMNIPAQUE IOHEXOL 300 MG/ML SOLN FLUOROSCOPY TIME:  1 minutes and 30 seconds.  146.6 mGy. PROCEDURE: The procedure, risks, benefits, and alternatives were explained to the patient. Questions regarding the procedure were encouraged and answered. The patient understands and consents to the procedure. A time-out was performed prior to initiating the procedure. Pulmonary artery pressures were directly measured through the pulmonary arterial infusion catheters. The catheters were then removed. Left femoral vein sheaths placed yesterday were prepped and draped. Local anesthesia was provided with 1% Lidocaine. A guidewire was advanced into the inferior vena cava via one of the sheaths. This sheath was removed and the venotomy dilated. A 10 French deployment sheath was advanced over the guidewire. This was utilized to perform IVC venography. The deployment sheath  was further positioned in an appropriate location for filter deployment. A Bard Denali IVC filter was then advanced in  the sheath. This was then fully deployed in the infrarenal IVC. Final filter position was confirmed with a fluoroscopic spot image. After the procedure the sheath was removed and hemostasis obtained with manual compression. COMPLICATIONS: None. FINDINGS: Measure pulmonary artery pressure is 55/20, mean 28 mm Hg compared to 61/16, 40 mm Hg prior to lytic therapy. IVC venography demonstrates a normal caliber IVC with no evidence of thrombus. Renal veins are identified bilaterally. The IVC filter was successfully positioned below the level of the renal veins and is appropriately oriented. This IVC filter has both permanent and retrievable indications. IMPRESSION: 1. Decrease in measured pulmonary artery pressure after 12 hours of bilateral pulmonary arterial thrombolytic therapy. 2. Placement of percutaneous IVC filter in infrarenal IVC. IVC venogram shows no evidence of IVC thrombus and normal caliber of the inferior vena cava. This filter does have both permanent and retrievable indications. PLAN: This IVC filter is potentially retrievable. The patient will be assessed for filter retrieval by Interventional Radiology in approximately 8-12 weeks. Further recommendations regarding filter retrieval, continued surveillance or declaration of device permanence, will be made at that time. Electronically Signed   By: Aletta Edouard M.D.   On: 08/31/2019 14:25   ASSESSMENT: 69 y.o. male from [place], [place] with massive PE and signiificant heart strain and LLE DVT diagnosed in February 2021.  He is status post catheter directed TPA followed by heparin drip and then transition to Xarelto.  Additionally, he had an IVC filter placed on 08/31/2019.  Factor V Leiden heterozygous.  PLAN: Mr. Hevia developed hemoptysis while on Xarelto.  His Xarelto has been placed on hold and his hemoptysis has resolved.  Pulmonology has seen the patient who recommends a CT of the chest and possible bronchoscopy to evaluate his hemoptysis  further.  The patient was noted to have abnormal LFTs on admission which are trending downward.  His acute hepatitis panel was negative.  Given his history of massive PE and left lower extremity DVT, he should be considered for anticoagulation.  However, we need further evaluation by pulmonology for his hemoptysis.  Once we have this information, we can make further recommendations regarding anticoagulation.   Mikey Bussing, NP 09/11/2019 10:39 AM   ADDENDUM: 69 y/o [place] man with the factor V Leiden mutation, no significant clotting or bleeding history until 08/30/2019 when he experienced unprovoked bilateral pulmonary emboli, with complete occlusion of the Right pulmonary artery and severe occlusion on the Left side. He was treated with TPA and transitioned to rivaroxaban at standard doses. A removable IVC filter was placed as there were residual DVTs in both legs (L>R). He was discharged 09/02/2019 and did well at home for several days but was re-admitted 09/10/2019 with hemoptysis, now resolved.  Repeat CT angio shows improved but persistent PEs, no obvious cause of hemoptysis. Bronchoscopy is being considered.  I discussed with the patient that quite aside from the Johnson mutation he is at high risk of further clots and will need lifelong anticoagulation. There is limited data that lower doses of rivaroxaban can be effective and presumably safer in terms of bleeding risk. In this case once workup is completed and patient stable I would resume rivaroxaban at 15 mg po daily until patient runs out of his current supply (he should have approximately 30 tablets left) then drop the dose to 10 mg daily. He understands any dose of anticoagulant increases the  risk of bleeding, but I feel given the current situation the doses just described are the best compromise,  I will plan to see him as outpatient 09/22/2019.  Will follow with you while in the hospital.   I personally saw this patient and  performed a substantive portion of this encounter with the listed APP documented above.   Chauncey Cruel, MD Medical Oncology and Hematology [facility] [address] [place], [place] [address] Tel. [phone]    Fax. [phone]

## 2019-09-11 NOTE — Progress Notes (Signed)
PHARMACY - PHYSICIAN COMMUNICATION CRITICAL VALUE ALERT - BLOOD CULTURE IDENTIFICATION (BCID)  Joe Cantu is an 69 y.o. male who presented to [facility] on 09/10/2019 with a chief complaint of coughing with hemoptysis.  Assessment:  Patient with aspiration pneumonitis.  Blood culture (2/2 both aerobic) + E. Coli   Name of physician (or Provider) Contacted: Donnamarie Poag, FNP  Current antibiotics:  Zosyn 3.375gm IV q8h  Changes to prescribed antibiotics recommended:  Due to concern for aspiration, continue with current antibiotic regimen.  Results for orders placed or performed during the hospital encounter of 09/10/19  Blood Culture ID Panel (Reflexed) (Collected: 09/10/2019  8:26 PM)  Result Value Ref Range   Enterococcus species NOT DETECTED NOT DETECTED   Listeria monocytogenes NOT DETECTED NOT DETECTED   Staphylococcus species NOT DETECTED NOT DETECTED   Staphylococcus aureus (BCID) NOT DETECTED NOT DETECTED   Streptococcus species NOT DETECTED NOT DETECTED   Streptococcus agalactiae NOT DETECTED NOT DETECTED   Streptococcus pneumoniae NOT DETECTED NOT DETECTED   Streptococcus pyogenes NOT DETECTED NOT DETECTED   Acinetobacter baumannii NOT DETECTED NOT DETECTED   Enterobacteriaceae species DETECTED (A) NOT DETECTED   Enterobacter cloacae complex NOT DETECTED NOT DETECTED   Escherichia coli DETECTED (A) NOT DETECTED   Klebsiella oxytoca NOT DETECTED NOT DETECTED   Klebsiella pneumoniae NOT DETECTED NOT DETECTED   Proteus species NOT DETECTED NOT DETECTED   Serratia marcescens NOT DETECTED NOT DETECTED   Carbapenem resistance NOT DETECTED NOT DETECTED   Haemophilus influenzae NOT DETECTED NOT DETECTED   Neisseria meningitidis NOT DETECTED NOT DETECTED   Pseudomonas aeruginosa NOT DETECTED NOT DETECTED   Candida albicans NOT DETECTED NOT DETECTED   Candida glabrata NOT DETECTED NOT DETECTED   Candida krusei NOT DETECTED NOT DETECTED   Candida parapsilosis NOT DETECTED NOT  DETECTED   Candida tropicalis NOT DETECTED NOT DETECTED    Maryellen Pile, PharmD 09/11/2019  8:50 PM

## 2019-09-11 NOTE — Progress Notes (Signed)
@LOGO @  Joe Cantu, male    DOB: 10/18/1950, 69 y.o.   MRN: [id number]   Brief patient profile:  59 yobm remote smoker s/p admit 2/17- 08/2018 eval by Kylian Loh:   Hemoptysis with recent acute massive pulmonary embolism, DVT, history of aortic root dilatation on chronic anticoagulation -  hospitalized after syncopal episode and was found to have massive bilateral PE with right heart strain.  Received catheter directed TPA on 2/17 and IVC filter was placed on 2/18.  Patient was initially placed on heparin drip, subsequently transitioned to Xarelto and f/u with NP 3/18  2/26 continued doe s 02 need but no cp, hemoptysis then acute onset hemoptysis p eating lunch ? Gagged?  on day of admit x ? 150cc of dark blood to brought to ER where no drop in hgb and no acute change on cxr and PCCM service asked to consult.  Note w/u also c/w ?liver dz with INR elevation.  Note pt denies any increase sob with onset of hemoptysis, also no cp, no sob over baseline, no assoc epistaxis. Has not had any recurrent bleeding since arrival with mostly dry cough.   Past Medical History:  Diagnosis Date  . Aortic root dilatation (HCC) 02/21/2019  . Dyspnea on exertion   . Frequent unifocal PVCs 02/21/2019  . Glaucoma   . Gonorrhea    18 & 19 yrs of age; negative for syphillis, herpes and HIV  . Left inguinal hernia   . Pneumonia   . Ventricular premature beats   . Wears dentures      No obvious day to day or daytime variability or assoc excess/ purulent sputum or mucus plugs or  chest tightness, subjective wheeze or overt sinus or hb symptoms.   Sleeping without nocturnal  or early am exacerbation  of respiratory  c/o's or need for noct saba. Also denies any obvious fluctuation of symptoms with weather or environmental changes or other aggravating or alleviating factors except as outlined above   No unusual exposure hx or h/o childhood pna/ asthma or knowledge of premature birth.  Current Allergies, Complete  Past Medical History, Past Surgical History, Family History, and Social History were reviewed in 04/23/2019 record.  ROS  The following are not active complaints unless bolded Hoarseness, sore throat, dysphagia, dental problems, itching, sneezing,  nasal congestion or discharge of excess mucus or purulent secretions, ear ache,   fever, chills, sweats, unintended wt loss or wt gain, classically pleuritic or exertional cp,  orthopnea pnd or arm/hand swelling  or leg swelling, presyncope, palpitations, abdominal pain, anorexia, nausea, vomiting, diarrhea  or change in bowel habits or change in bladder habits, change in stools or change in urine, dysuria, hematuria,  rash, arthralgias, visual complaints, headache, numbness, weakness or ataxia or problems with walking or coordination,  change in mood or  memory.        Current Meds  Medication Sig  . acetaminophen (TYLENOL) 325 MG tablet Take 650 mg by mouth every 6 (six) hours as needed for mild pain or moderate pain.  . dorzolamide (TRUSOPT) 2 % ophthalmic solution Place 1 drop into both eyes 2 (two) times daily.  . Latanoprostene Bunod (VYZULTA) 0.024 % SOLN Apply 1 drop to eye daily.  . metoprolol tartrate (LOPRESSOR) 50 MG tablet Take 1 tablet (50 mg total) by mouth 2 (two) times daily.  . Rivaroxaban 15 & 20 MG TBPK Follow package directions: Take one 15mg  tablet by mouth twice a day. On day 22,  switch to one 20mg  tablet once a day. Take with food.  . tamsulosin (FLOMAX) 0.4 MG CAPS capsule Take 0.4 mg by mouth at bedtime.  . timolol (TIMOPTIC) 0.5 % ophthalmic solution Place 1 drop into both eyes 2 (two) times daily.            Objective:     BP (!) 114/53   Pulse 94   Temp 99.3 F (37.4 C) (Axillary)   Resp (!) 28   Ht 6\' 3"  (1.905 m)   Wt 77.3 kg   SpO2 96%   BMI 21.30 kg/m   SpO2: 96 %   Elderly bm nad RA  Pt alert, approp nad @ 30 degrees  No jvd Oropharynx clear,  mucosa nl Neck supple Lungs  clear bilaterally RRR no s3 or or sign murmur or def increase in P2 Abd soft/ nl  excursion  Extr warm with no edema or clubbing noted Neuro  Sensorium nl ,  no apparent motor deficits      CXR PA and Lateral:   09/10/2019 :    I personally reviewed images and agree with radiology impression as follows:    Cardiomegaly, without acute disease.  Right base pulmonary opacity, similar to on the prior CT.  Pulmonary artery enlargement, suggesting pulmonary arterial Hypertension.  CTa 08/30/19 c/w massive PE plus: Triangular subpleural opacities in the right upper and lower lobes likely pulmonary infarct. Diminished pulmonary vessels in the right lung related to pulmonary embolus. Subpleural opacities in the left lower lobe favor atelectasis. No pulmonary mass. No pulmonary edema.     Lab Results  Component Value Date   HGB 11.0 (L) 09/11/2019   HGB 10.4 (L) 09/10/2019   HGB 10.7 (L) 09/02/2019     Labs ordered/ reviewed:    Lab Results  Component Value Date   ALT 89 (H) 09/11/2019   AST 70 (H) 09/11/2019   ALKPHOS 173 (H) 09/11/2019   BILITOT 1.3 (H) 09/11/2019     Lab Results  Component Value Date   WBC 16.7 (H) 09/11/2019   HGB 11.0 (L) 09/11/2019   HCT 32.8 (L) 09/11/2019   MCV 92.7 09/11/2019   PLT 328 09/11/2019      Lab Results  Component Value Date   WBC 16.7 (H) 09/11/2019   HGB 11.0 (L) 09/11/2019   HCT 32.8 (L) 09/11/2019   MCV 92.7 09/11/2019   PLT 328 09/11/2019      Lab Results  Component Value Date   INR 2.7 (H) 09/10/2019   INR 1.2 08/30/2019             Assessment  Hemoptysis in the setting of anticoagulation Get CT chest to evaluate lung and possible source of bleed Plan for bronchoscopy tomorrow to examine airways If he still has significant clot burden and normal airways then will recommend restarting anticoagulation  DVT, PE, factor V Leyden heterozygote S/p lytics and IVC filter at last admission We will recommend  hematology consult Ideally would like indefinite anticoagulation but if he has recurrent bleed then at least 3 to 6 months  Elevated INR and LFTs No alcohol use. Follow-up hepatitis panel, right upper quadrant ultrasound and 2D echo.  Aspiration pneumonitis, elevated procalcitonin Continue IV Zosyn.  Marshell Garfinkel MD [facility] Please see [email or web address] for pager details.  09/11/2019, 7:54 AM

## 2019-09-12 ENCOUNTER — Encounter (HOSPITAL_COMMUNITY)
Admission: EM | Disposition: A | Discharge status: Home Health | Payer: Self-pay | Source: Home / Self Care | Attending: Family Medicine

## 2019-09-12 ENCOUNTER — Encounter (HOSPITAL_COMMUNITY): Payer: Self-pay | Admitting: Internal Medicine

## 2019-09-12 HISTORY — PX: BRONCHIAL WASHINGS: SHX5105

## 2019-09-12 HISTORY — PX: VIDEO BRONCHOSCOPY: SHX5072

## 2019-09-12 LAB — RESPIRATORY PANEL BY PCR

## 2019-09-12 LAB — BASIC METABOLIC PANEL
Anion gap: 10 (ref 5–15)
BUN: 15 mg/dL (ref 8–23)
CO2: 21 mmol/L — ABNORMAL LOW (ref 22–32)
Calcium: 9.3 mg/dL (ref 8.9–10.3)
Chloride: 107 mmol/L (ref 98–111)
Creatinine, Ser: 0.83 mg/dL (ref 0.61–1.24)
GFR calc Af Amer: >60 mL/min (ref >60)
GFR calc non Af Amer: >60 mL/min (ref >60)
Glucose, Bld: 113 mg/dL — ABNORMAL HIGH (ref 70–99)
Potassium: 3.8 mmol/L (ref 3.5–5.1)
Sodium: 138 mmol/L (ref 135–145)

## 2019-09-12 LAB — CBC
HCT: 32.4 % — ABNORMAL LOW (ref 39.0–52.0)
Hemoglobin: 10.4 g/dL — ABNORMAL LOW (ref 13.0–17.0)
MCH: 30.8 pg (ref 26.0–34.0)
MCHC: 32.1 g/dL (ref 30.0–36.0)
MCV: 95.9 fL (ref 80.0–100.0)
Platelets: 325 10*3/uL (ref 150–400)
RBC: 3.38 MIL/uL — ABNORMAL LOW (ref 4.22–5.81)
RDW: 13.9 % (ref 11.5–15.5)
WBC: 13.3 10*3/uL — ABNORMAL HIGH (ref 4.0–10.5)
nRBC: 0 % (ref 0.0–0.2)

## 2019-09-12 LAB — BODY FLUID CELL COUNT WITH DIFFERENTIAL
Eos, Fluid: 0 %
Lymphs, Fluid: 17 %
Monocyte-Macrophage-Serous Fluid: 74 % (ref 50–90)
Neutrophil Count, Fluid: 9 % (ref 0–25)
Total Nucleated Cell Count, Fluid: 107 cu mm (ref 0–1000)

## 2019-09-12 LAB — PNEUMOCYSTIS JIROVECI SMEAR BY DFA: Pneumocystis jiroveci Ag: NEGATIVE

## 2019-09-12 LAB — HEPATIC FUNCTION PANEL
ALT: 69 U/L — ABNORMAL HIGH (ref 0–44)
AST: 41 U/L (ref 15–41)
Albumin: 2 g/dL — ABNORMAL LOW (ref 3.5–5.0)
Alkaline Phosphatase: 152 U/L — ABNORMAL HIGH (ref 38–126)
Bilirubin, Direct: 0.7 mg/dL — ABNORMAL HIGH (ref 0.0–0.2)
Indirect Bilirubin: 1.1 mg/dL — ABNORMAL HIGH (ref 0.3–0.9)
Total Bilirubin: 1.8 mg/dL — ABNORMAL HIGH (ref 0.3–1.2)
Total Protein: 5.8 g/dL — ABNORMAL LOW (ref 6.5–8.1)

## 2019-09-12 LAB — PROTIME-INR
INR: 1.3 — ABNORMAL HIGH (ref 0.8–1.2)
Prothrombin Time: 16 seconds — ABNORMAL HIGH (ref 11.4–15.2)

## 2019-09-12 SURGERY — VIDEO BRONCHOSCOPY WITHOUT FLUORO
Anesthesia: Moderate Sedation

## 2019-09-12 MED ORDER — RIVAROXABAN 15 MG PO TABS
15.0000 mg | ORAL_TABLET | Freq: Every day | ORAL | Status: DC
  Administered 2019-09-13 – 2019-09-14 (×2): 15 mg via ORAL
  Filled 2019-09-12 (×2): qty 1

## 2019-09-12 MED ORDER — MIDAZOLAM HCL (PF) 10 MG/2ML IJ SOLN
INTRAMUSCULAR | Status: DC | PRN
  Administered 2019-09-12 (×3): 1 mg via INTRAVENOUS

## 2019-09-12 MED ORDER — FENTANYL CITRATE (PF) 100 MCG/2ML IJ SOLN
INTRAMUSCULAR | Status: DC | PRN
  Administered 2019-09-12 (×3): 25 ug via INTRAVENOUS

## 2019-09-12 MED ORDER — LIDOCAINE HCL URETHRAL/MUCOSAL 2 % EX GEL
1.0000 "application " | Freq: Once | CUTANEOUS | Status: DC
  Filled 2019-09-12: qty 5

## 2019-09-12 MED ORDER — MIDAZOLAM HCL (PF) 5 MG/ML IJ SOLN
INTRAMUSCULAR | Status: AC
  Filled 2019-09-12: qty 2

## 2019-09-12 MED ORDER — DOCUSATE SODIUM 100 MG PO CAPS
200.0000 mg | ORAL_CAPSULE | Freq: Every day | ORAL | Status: DC
  Administered 2019-09-12 – 2019-09-17 (×6): 200 mg via ORAL
  Filled 2019-09-12 (×6): qty 2

## 2019-09-12 MED ORDER — LIDOCAINE HCL URETHRAL/MUCOSAL 2 % EX GEL
CUTANEOUS | Status: DC | PRN
  Administered 2019-09-12: 1

## 2019-09-12 MED ORDER — FENTANYL CITRATE (PF) 100 MCG/2ML IJ SOLN
INTRAMUSCULAR | Status: AC
  Filled 2019-09-12: qty 2

## 2019-09-12 MED ORDER — BUTAMBEN-TETRACAINE-BENZOCAINE 2-2-14 % EX AERO
INHALATION_SPRAY | CUTANEOUS | Status: DC | PRN
  Administered 2019-09-12: 10:00:00 2 via TOPICAL

## 2019-09-12 MED ORDER — BUTAMBEN-TETRACAINE-BENZOCAINE 2-2-14 % EX AERO
1.0000 | INHALATION_SPRAY | Freq: Once | CUTANEOUS | Status: DC
  Filled 2019-09-12: qty 20

## 2019-09-12 MED ORDER — PHENYLEPHRINE HCL 0.25 % NA SOLN
1.0000 | Freq: Four times a day (QID) | NASAL | Status: DC | PRN
  Filled 2019-09-12 (×2): qty 15

## 2019-09-12 MED ORDER — PHENYLEPHRINE HCL 0.5 % NA SOLN
NASAL | Status: DC | PRN
  Administered 2019-09-12: 2 [drp] via NASAL

## 2019-09-12 MED ORDER — LIDOCAINE HCL (PF) 1 % IJ SOLN
INTRAMUSCULAR | Status: DC | PRN
  Administered 2019-09-12 (×4): 3 mL

## 2019-09-12 NOTE — Plan of Care (Signed)
Pt alert and oriented x 4 able to express needs denies pain. Continues to cough up blood. MD aware. Pt to have procedure this am remains NPO. On room air with no acute distress noted. Remains on IV ABTs. Tele in place and monitored by staff. Continent of bowel and bladder urinal in reach mobility assist x1 when out of bed. Call bell within reach skid resistant socks in place bed in low position will continue to monitor

## 2019-09-12 NOTE — Op Note (Signed)
[facility] Patient Name: Joe Cantu Procedure Date: 09/12/2019 MRN: [id number] Attending MD: Marshell Garfinkel , MD Date of Birth: [birth year] CSN: [id number] Age: 69 Admit Type: Inpatient Ethnicity: Not Hispanic or Latino Procedure:             Bronchoscopy Indications:           Hemoptysis Providers:             Marshell Garfinkel, MD, Burtis Junes, RN, Lina Sar,                         Technician Referring MD:           Medicines:             Fentanyl 75 mcg IV, Midazolam 3 mg mg IV Complications:         No immediate complications. Estimated Blood Loss:  Estimated blood loss: none. Procedure:      Pre-Anesthesia Assessment:      - A History and Physical has been performed. Patient meds and allergies       have been reviewed. The risks and benefits of the procedure and the       sedation options and risks were discussed with the patient. All       questions were answered and informed consent was obtained. Patient       identification and proposed procedure were verified prior to the       procedure by the physician in the pre-procedure area. Mental Status       Examination: alert and oriented. Airway Examination: normal       oropharyngeal airway. Respiratory Examination: clear to auscultation. CV       Examination: normal. ASA Grade Assessment: II - A patient with mild       systemic disease. After reviewing the risks and benefits, the patient       was deemed in satisfactory condition to undergo the procedure. The       anesthesia plan was to use moderate sedation / analgesia (conscious       sedation). Immediately prior to administration of medications, the       patient was re-assessed for adequacy to receive sedatives. The heart       rate, respiratory rate, oxygen saturations, blood pressure, adequacy of       pulmonary ventilation, and response to care were monitored throughout       the procedure. The physical status of the patient was  re-assessed after       the procedure.      After obtaining informed consent, the bronchoscope was passed under       direct vision. Throughout the procedure, the patient's blood pressure,       pulse, and oxygen saturations were monitored continuously. the BF-H190       ([id number]) Olympus Bronchoscope was introduced through the left nostril       and advanced to the tracheobronchial tree of both lungs. Findings:      The nasopharynx/oropharynx appears normal. The larynx appears normal.       The vocal cords appear normal with a bit of old blood noted in posterior       commisure. The subglottic space is normal. The trachea is of normal       caliber. The carina is sharp. The tracheobronchial tree was examined to       at least the first subsegmental level. Bronchial  mucosa and anatomy are       normal; there are no endobronchial lesions. Mucoid bloody secretions and       old dried blood noted in bilateral airways starting in trachea extending       to lower lobes, The secretions appears to be tracking down from above       the trachea. Most of the old blood noted in the lower lobes. These were       cleared away with suctioning and lavage. No active bleeding noted on       multiple inspection passess in the bilateral airways. No evidence of       diffuse alveolar hemorrhage in serial lavage in the RLL      Bronchoalveolar lavage was performed in the RLL anterior basal segment       (B8) of the lung and sent for cell count, bacterial culture, viral       smears & culture, and fungal & AFB analysis and cytology. 180 mL of       fluid were instilled in 3 aliquots. 75 mL were returned. The return was       blood-tinged but did not get progressively bloodier. There were no       mucoid plugs in the return fluid. Multiple specimens were obtained and       pooled into one specimen, which was sent for analysis. Impression:      - Hemoptysis      - The airway examination did not show  endbronchial abnormalities or       active bleeding. Blood tinged mucus and old blood noted in the bilateral       airways.      - Bronchoalveolar lavage was performed. Moderate Sedation:      Moderate (conscious) sedation was administered by the endoscopy nurse       and supervised by the endoscopist. The following parameters were       monitored: oxygen saturation, heart rate, blood pressure, respiratory       rate, EKG, adequacy of pulmonary ventilation, and response to care.       Total physician intraservice time was 25 minutes. Recommendation:      - Await BAL results. Procedure Code(s):      --- Professional ---      [phone], Bronchoscopy, rigid or flexible, including fluoroscopic guidance,       when performed; with bronchial alveolar lavage      [id number], Moderate sedation services provided by the same physician or       other qualified health care professional performing the diagnostic or       therapeutic service that the sedation supports, requiring the presence       of an independent trained observer to assist in the monitoring of the       patient's level of consciousness and physiological status; initial 15       minutes of intraservice time, patient age 22 years or older      [phone], Moderate sedation; each additional 15 minutes intraservice time Diagnosis Code(s):      --- Professional ---      R04.2, Hemoptysis CPT copyright 2019 [employer]. All rights reserved. The codes documented in this report are preliminary and upon coder review may  be revised to meet current compliance requirements. Marshell Garfinkel, MD 09/12/2019 11:32:01 AM Number of Addenda: 0 Scope In: Scope Out:

## 2019-09-12 NOTE — Progress Notes (Signed)
Pt resting will continue to monitor.  

## 2019-09-12 NOTE — Progress Notes (Signed)
[facility]                                                                              Patient Demographics  Joe Cantu, is a 69 y.o. male, DOB - [birth year], YWV:[id number]  Admit date - 09/10/2019   Admitting Physician Fanta Wimberley Krystal Eaton, MD  Outpatient Primary MD for the patient is Iona Beard, MD  Outpatient specialists:   LOS - 2  days   Medical records reviewed and are as summarized below:    Chief Complaint  Patient presents with  . Hematemesis       Brief summary   Patient is a 69 year old male with history of frequent PVCs, aortic root dilatation, recently admitted 2/17-2/20/2021 and was found to have syncope with acute massive PE, left lower extremity DVT with significant right heart dysfunction.  Patient received catheter directed TPA, was placed on heparin drip then transitioned to Xarelto, lifelong.  Retrievable IVC filter was placed on 2/10, removable in 3 months. Patient presented to ED with hemoptysis, while he was eating lunch (potato soup), started having coughing.  Compliant with Xarelto.  Denied any vomiting/hematemesis, hematochezia or melena.  At baseline has chronic shortness of breath with exertion, fatigue and weakness. In ED, patient was noted to have borderline BP 96/58, O2 sats 90% on room air, afebrile, pulse 94, respiratory 24-30 LFTs were somewhat elevated, INR 2.7, hemoglobin 10.4, hematocrit 31.8  Patient was admitted for further work-up, pulmonology was consulted.  Assessment & Plan     Hemoptysis with recent acute massive pulmonary embolism, DVT, history of aortic root dilatation on chronic anticoagulation -Patient was recently hospitalized after syncopal episode and was found to have massive bilateral PE with right heart strain.  Received catheter directed TPA on 2/17 and IVC filter was placed on 2/18.  Patient was initially placed on heparin drip, subsequently transitioned to Xarelto, states he is compliant -Xarelto held,  pulmonology consulted.  Repeat CT chest showed large bilateral pulmonary emboli in the main portions of the pulmonary arteries, slightly decreased in size compared to previous exam -Hemoptysis again overnight, underwent bronchoscopy, dried blood noted in the airways but no active bleeding or endobronchial abnormalities.   -Hematology also consulted, Dr. Jana Hakim, will await recommendations regarding anticoagulation  Recent PE, LLE DVT, factor V Leyden heterozygote -Status post catheter directed thrombolysis, IVC filter during previous admission, was discharged on Xarelto -Pulmonology and hematology following, will follow recommendations regarding anticoagulation   Acute transaminitis -Denies any history of alcohol use, recent acute massive PE with severe pulmonary hypertension, moderately reduced right ventricular systolic function, size severely enlarged, PA pressure 76.2.  No prior history of liver disease.  However patient may have passive congestion due to right heart failure -Acute hepatitis panel negative, right upper quadrant ultrasound normal study, no focal liver lesions identified. -LFTs, INR improving -Repeat 2D echo showed EF 50 to 85%, grade 1 diastolic dysfunction, n right ventricular systolic function now improved, moderately elevated PA pressure, also improved to 56.   Aspiration pneumonitis, E. coli bacteremia -Patient may have aspirated during the coughing episodes -Continue IV Zosyn, blood cultures positive for E. coli, follow sensitivities  -SLP evaluation  Frequent unifocal PVCs, hypotension -BP now stable, resumed metoprolol at lower dose, 12.5 mg twice daily (at home on 50 mg twice daily)  Prolonged QTC,  -Hold off on QT prolonging medications, keep potassium> 4, mag> 2  Hypokalemia -Improved   Code Status: Full CODE STATUS DVT Prophylaxis:   SCD's Family Communication: Discussed all imaging results, lab results, explained to the patient    Disposition  Plan: Patient from home, anticipated discharge to home once cleared from pulmonology and hematology oncology.  Current work-up for hemoptysis in the setting of anticoagulation for recent massive PE, DVT precludes safe discharge home at this time.   Time Spent in minutes   35 minutes  Procedures:  None  Consultants:   Pulmonology, Dr. Vaughan Browner Oncology, Dr. Jana Hakim  Antimicrobials:   Anti-infectives (From admission, onward)   Start     Dose/Rate Route Frequency Ordered Stop   09/10/19 2000  piperacillin-tazobactam (ZOSYN) IVPB 3.375 g     3.375 g 12.5 mL/hr over 240 Minutes Intravenous Every 8 hours 09/10/19 1833           Medications  Scheduled Meds: . Chlorhexidine Gluconate Cloth  6 each Topical Daily  . dorzolamide  1 drop Both Eyes BID  . latanoprost  1 drop Both Eyes Daily  . metoprolol tartrate  12.5 mg Oral BID  . pantoprazole (PROTONIX) IV  40 mg Intravenous Q12H  . tamsulosin  0.4 mg Oral QHS  . timolol  1 drop Both Eyes BID   Continuous Infusions: . piperacillin-tazobactam (ZOSYN)  IV 3.375 g (09/12/19 1135)   PRN Meds:.acetaminophen **OR** acetaminophen, HYDROcodone-acetaminophen, ondansetron **OR** ondansetron (ZOFRAN) IV      Subjective:   Joe Cantu was seen and examined today.  States overnight started having coughing and hemoptysis again.  No chest pain, fever chills or dizziness.  Denied any nausea or vomiting or hematemesis.   Objective:   Vitals:   09/12/19 1041 09/12/19 1045 09/12/19 1050 09/12/19 1100  BP: 128/74  122/70 126/78  Pulse:  82 (!) 42 (!) 47  Resp: (!) 24 (!) 34 (!) 30 (!) 25  Temp:      TempSrc:      SpO2: 92% 93% 98% 94%  Weight:      Height:        Intake/Output Summary (Last 24 hours) at 09/12/2019 1227 Last data filed at 09/12/2019 0400 Gross per 24 hour  Intake 22.54 ml  Output 875 ml  Net -852.46 ml     Wt Readings from Last 3 Encounters:  09/12/19 77.3 kg  09/08/19 78.1 kg  08/30/19 78 kg    Physical  Exam  General: Alert and oriented x 3, NAD  Cardiovascular: S1 S2 clear, RRR. No pedal edema b/l  Respiratory: CTAB, no wheezing, rales or rhonchi  Gastrointestinal: Soft, nontender, nondistended, NBS  Ext: no pedal edema bilaterally  Neuro: no new deficits  Musculoskeletal: No cyanosis, clubbing  Skin: No rashes  Psych: Normal affect and demeanor, alert and oriented x3    Data Reviewed:  I have personally reviewed following labs and imaging studies  Micro Results Recent Results (from the past 240 hour(s))  SARS CORONAVIRUS 2 (TAT 6-24 HRS) Nasopharyngeal Nasopharyngeal Swab     Status: None   Collection Time: 09/10/19  5:00 PM   Specimen: Nasopharyngeal Swab  Result Value Ref Range Status   SARS Coronavirus 2 NEGATIVE NEGATIVE Final    Comment: (NOTE) SARS-CoV-2 target nucleic acids are NOT DETECTED. The SARS-CoV-2 RNA is generally detectable in  upper and lower respiratory specimens during the acute phase of infection. Negative results do not preclude SARS-CoV-2 infection, do not rule out co-infections with other pathogens, and should not be used as the sole basis for treatment or other patient management decisions. Negative results must be combined with clinical observations, patient history, and epidemiological information. The expected result is Negative. Fact Sheet for Patients: SugarRoll.be Fact Sheet for Healthcare Providers: https://www.woods-mathews.com/ This test is not yet approved or cleared by the [place] FDA and  has been authorized for detection and/or diagnosis of SARS-CoV-2 by FDA under an Emergency Use Authorization (EUA). This EUA will remain  in effect (meaning this test can be used) for the duration of the COVID-19 declaration under Section 56 4(b)(1) of the Act, 21 U.S.C. section 360bbb-3(b)(1), unless the authorization is terminated or revoked sooner. Performed at [facility], [place]  [address], [place], [place] [address]   MRSA PCR Screening     Status: None   Collection Time: 09/10/19  8:05 PM   Specimen: Nasal Mucosa; Nasopharyngeal  Result Value Ref Range Status   MRSA by PCR NEGATIVE NEGATIVE Final    Comment:        The GeneXpert MRSA Assay (FDA approved for NASAL specimens only), is one component of a comprehensive MRSA colonization surveillance program. It is not intended to diagnose MRSA infection nor to guide or monitor treatment for MRSA infections. Performed at [facility], [place] [address] [place], [place] [address]   Culture, blood (Routine X 2) w Reflex to ID Panel     Status: Abnormal (Preliminary result)   Collection Time: 09/10/19  8:26 PM   Specimen: BLOOD  Result Value Ref Range Status   Specimen Description   Final    BLOOD RIGHT ANTECUBITAL Performed at [facility] [address], [place], [place] [address]    Special Requests   Final    BOTTLES DRAWN AEROBIC ONLY Blood Culture adequate volume Performed at [place] [address], [place], [place] [address]    Culture  Setup Time   Final    AEROBIC BOTTLE ONLY GRAM NEGATIVE RODS CRITICAL RESULT CALLED TO, READ BACK BY AND VERIFIED WITH: L POINDEXTER PHARMD 09/11/19 2021 JDW Performed at [facility], [place] [address] [place], [place] [address]    Culture ESCHERICHIA COLI (A)  Final   Report Status PENDING  Incomplete  Culture, blood (Routine X 2) w Reflex to ID Panel     Status: None (Preliminary result)   Collection Time: 09/10/19  8:26 PM   Specimen: BLOOD  Result Value Ref Range Status   Specimen Description   Final    BLOOD RIGHT PORTA CATH Performed at [place] [address] [place], [place] [address]    Special Requests   Final    BOTTLES DRAWN AEROBIC ONLY Blood Culture results may not be optimal due to an inadequate volume of blood received in culture bottles Performed at [facility] [address], [place], [place] [address]    Culture  Setup Time   Final    AEROBIC BOTTLE ONLY GRAM NEGATIVE RODS CRITICAL VALUE NOTED.  VALUE IS CONSISTENT WITH PREVIOUSLY REPORTED AND CALLED VALUE. Performed at [facility], [place] [address], [place], [place] [address]    Culture GRAM NEGATIVE RODS  Final   Report Status PENDING  Incomplete  Blood Culture ID Panel (Reflexed)     Status: Abnormal  Collection Time: 09/10/19  8:26 PM  Result Value Ref Range Status   Enterococcus species NOT DETECTED NOT DETECTED Final   Listeria monocytogenes NOT DETECTED NOT DETECTED Final   Staphylococcus species NOT DETECTED NOT DETECTED Final   Staphylococcus aureus (BCID) NOT DETECTED NOT DETECTED Final   Streptococcus species NOT DETECTED NOT DETECTED Final   Streptococcus agalactiae NOT DETECTED NOT DETECTED Final   Streptococcus pneumoniae NOT DETECTED NOT DETECTED Final   Streptococcus pyogenes NOT DETECTED NOT DETECTED Final   Acinetobacter baumannii NOT DETECTED NOT DETECTED Final   Enterobacteriaceae species DETECTED (A) NOT DETECTED Final    Comment: Enterobacteriaceae represent a large family of gram-negative bacteria, not a single organism. CRITICAL RESULT CALLED TO, READ BACK BY AND VERIFIED WITH: L POINDEXTER PHARMD 09/11/19 2021 JDW    Enterobacter cloacae complex NOT DETECTED NOT DETECTED Final   Escherichia coli DETECTED (A) NOT DETECTED Final    Comment: CRITICAL RESULT CALLED TO, READ BACK BY AND VERIFIED WITH: L POINDEXTER PHARMD 09/11/19 2021 JDW    Klebsiella oxytoca NOT DETECTED NOT DETECTED Final   Klebsiella pneumoniae NOT DETECTED NOT DETECTED Final   Proteus species NOT DETECTED NOT DETECTED Final   Serratia marcescens NOT DETECTED NOT DETECTED Final   Carbapenem resistance NOT DETECTED NOT DETECTED Final   Haemophilus influenzae NOT DETECTED NOT DETECTED Final   Neisseria meningitidis NOT DETECTED NOT DETECTED Final   Pseudomonas aeruginosa  NOT DETECTED NOT DETECTED Final   Candida albicans NOT DETECTED NOT DETECTED Final   Candida glabrata NOT DETECTED NOT DETECTED Final   Candida krusei NOT DETECTED NOT DETECTED Final   Candida parapsilosis NOT DETECTED NOT DETECTED Final   Candida tropicalis NOT DETECTED NOT DETECTED Final    Comment: Performed at [facility], [place] [address], [place], [place] [address]  Urine culture     Status: Abnormal (Preliminary result)   Collection Time: 09/10/19 10:32 PM   Specimen: Urine, Random  Result Value Ref Range Status   Specimen Description   Final    URINE, RANDOM Performed at [facility] [address], [place], [place] [address]    Special Requests   Final    NONE Performed at [facility], [place] [address] [place], [place] [address]    Culture (A)  Final    80,000 COLONIES/mL ESCHERICHIA COLI SUSCEPTIBILITIES TO FOLLOW Performed at [facility], [place] [address] [place],  [address]    Report Status PENDING  Incomplete    Radiology Reports DG Chest 2 View  Result Date: 09/10/2019 CLINICAL DATA:  Hemoptysis.  Recent PE. EXAM: CHEST - 2 VIEW COMPARISON:  08/30/2019 CT FINDINGS: Lateral view degraded by patient arm position. Midline trachea. Mild cardiomegaly. Pulmonary artery prominence. Right hemidiaphragm elevation. No pleural effusion or pneumothorax. Pulmonary interstitial prominence, favored to be due to AP portable technique. Subtle right lower lobe opacity is suspected, likely corresponding to infarct or atelectasis on prior CT. IMPRESSION: Cardiomegaly, without acute disease. Right base pulmonary opacity, similar to on the prior CT. Pulmonary artery enlargement, suggesting pulmonary arterial hypertension. Electronically Signed   By: Abigail Miyamoto M.D.   On: 09/10/2019 17:19   CT ANGIO CHEST PE W OR WO CONTRAST  Result Date: 09/11/2019 CLINICAL DATA:  History of pulmonary embolus. Hemoptysis. EXAM: CT ANGIOGRAPHY CHEST  WITH CONTRAST TECHNIQUE: Multidetector CT imaging of the chest was performed using the standard protocol during bolus administration of intravenous contrast. Multiplanar CT image reconstructions and MIPs were obtained to  evaluate the vascular anatomy. CONTRAST:  179m OMNIPAQUE IOHEXOL 350 MG/ML SOLN COMPARISON:  August 30, 2019. FINDINGS: Cardiovascular: There remains large filling defects in the main portions of both pulmonary arteries which may be slightly decreased compared to prior exam. Mild cardiomegaly is noted. No pericardial effusion is noted. Atherosclerosis of thoracic aorta is noted without aneurysm or dissection. Mediastinum/Nodes: No enlarged mediastinal, hilar, or axillary lymph nodes. Thyroid gland, trachea, and esophagus demonstrate no significant findings. Lungs/Pleura: No pneumothorax or pleural effusion is noted. Minimal bilateral posterior basilar subsegmental atelectasis is noted. Upper Abdomen: No acute abnormality. Musculoskeletal: No chest wall abnormality. No acute or significant osseous findings. Review of the MIP images confirms the above findings. IMPRESSION: There remains large bilateral pulmonary emboli in the main portions of the pulmonary arteries which are slightly decreased in size compared to prior exam. Aortic Atherosclerosis (ICD10-I70.0). Electronically Signed   By: JMarijo ConceptionM.D.   On: 09/11/2019 10:34   CT Angio Chest PE W and/or Wo Contrast  Result Date: 08/30/2019 CLINICAL DATA:  Syncopal episode while using the bathroom. EXAM: CT ANGIOGRAPHY CHEST WITH CONTRAST TECHNIQUE: Multidetector CT imaging of the chest was performed using the standard protocol during bolus administration of intravenous contrast. Multiplanar CT image reconstructions and MIPs were obtained to evaluate the vascular anatomy. CONTRAST:  1060mOMNIPAQUE IOHEXOL 350 MG/ML SOLN COMPARISON:  None. FINDINGS: Cardiovascular: Massive bilateral pulmonary arterial filling defects with near complete  occlusion of the right pulmonary artery, no pulmonary arterial blood flow noted to the right lung. Large thrombus in the left lower lobe pulmonary artery which is partially occlusive. Marked right heart dilatation with contrast refluxing into the hepatic veins and IVC. Elevated RV to LV ratio of 2.16. Aortic atherosclerosis. Cannot assess for dissection given phase of contrast. Mild dilatation of the aortic root at 3.6 cm without aortic aneurysm. No pericardial effusion. Mediastinum/Nodes: No adenopathy. No esophageal wall thickening. No suspicious thyroid nodule. Lungs/Pleura: Triangular subpleural opacities in the right upper and lower lobes likely pulmonary infarct. Diminished pulmonary vessels in the right lung related to pulmonary embolus. Subpleural opacities in the left lower lobe favor atelectasis. No pulmonary mass. No pulmonary edema. Upper Abdomen: Contrast refluxes into the hepatic veins and IVC. No other acute findings. Musculoskeletal: There are no acute or suspicious osseous abnormalities. Few bone islands in the right proximal humerus. Review of the MIP images confirms the above findings. IMPRESSION: 1. Massive pulmonary emboli with complete occlusion of the right main pulmonary artery and absent pulmonary arterial blood flow to the right lung. Large but partially occlusive filling defects in the left lower lobe pulmonary arteries. Significant right heart strain with RV to LV ratio of greater than 2, right heart dilatation and contrast refluxing into the hepatic veins and IVC. 2. Small pulmonary infarcts in the right upper and lower lobe. Critical Value/emergent results were called by telephone at the time of interpretation on 08/30/2019 at 6:24 am to Dr PEAddison Lank who verbally acknowledged these results. Electronically Signed   By: MeKeith Rake.D.   On: 08/30/2019 06:30   IR Angiogram Pulmonary Bilateral Selective  Result Date: 08/31/2019 INDICATION: Sub massive pulmonary embolism.  Request made for initiation of bilateral catheter directed pulmonary arterial thrombolysis. EXAM: 1. ULTRASOUND GUIDANCE FOR VENOUS ACCESS X2 2. PULMONARY ARTERIOGRAPHY 3. FLUOROSCOPIC GUIDED PLACEMENT OF BILATERAL PULMONARY ARTERIAL LYTIC INFUSION CATHETERS COMPARISON:  Chest CTA-earlier same day MEDICATIONS: None ANESTHESIA/SEDATION: Moderate (conscious) sedation was employed during this procedure. A total of Versed 2.5 mg and Fentanyl  100 mcg was administered intravenously. Moderate Sedation Time: 33 minutes. The patient's level of consciousness and vital signs were monitored continuously by radiology nursing throughout the procedure under my direct supervision. CONTRAST:  31m OMNIPAQUE IOHEXOL 300 MG/ML  SOLN FLUOROSCOPY TIME:  9 minutes, 18 seconds (1778mGy) COMPLICATIONS: None immediate. TECHNIQUE: Informed written consent was obtained from the patient after a discussion of the risks, benefits and alternatives to treatment. Questions regarding the procedure were encouraged and answered. A timeout was performed prior to the initiation of the procedure. Ultrasound scanning was performed of the right groin and demonstrated near occlusive thrombus within the right common femoral vein. Sonographic evaluation the left groin demonstrates wide patency of the left common femoral vein. As such, the left common femoral vein was selected venous access. The left groin was prepped and draped in the usual sterile fashion, and a sterile drape was applied covering the operative field. Maximum barrier sterile technique with sterile gowns and gloves were used for the procedure. A timeout was performed prior to the initiation of the procedure. Local anesthesia was provided with 1% lidocaine. Under direct ultrasound guidance, the right common femoral vein was accessed with a micro puncture kit ultimately allowing placement of a 6 French, 35 cm vascular sheath. Slightly cranial to this initial access, the right common femoral was  again accessed with an additional micropuncture kit ultimately allowing placement of an additional 7 French, 35 cm vascular sheath. Ultrasound images were saved for procedural documentation purposes. With the use of a stiff glidewire, a vertebral catheter was advanced into the main pulmonary artery and a limited central pulmonary arteriogram was performed. Pressure measurements were then obtained from the main pulmonary artery. The vertebral catheter was advanced into the distal branch of the left lower lobe pulmonary artery. Limited contrast injection confirmed appropriate positioning. Over an exchange length Rosen wire, the vertebral catheter was exchanged for a 90/10 cm multi side-hole infusion catheter. Again, with the use of a stiff glidewire, a vertebral catheter was advanced into a distal branch of the right lower lobe pulmonary artery. Limited contrast injection confirmed appropriate positioning. Over an exchange length Rosen wire, the pigtail catheter was exchanged for a 90/15 cm multi side-hole infusion catheter. A postprocedural fluoroscopic image was obtained to document final catheter positioning. Both vascular sheath were secured at the left groin. The external catheter tubing was secured at the right thigh and the lytic therapy was initiated. The patient tolerated the procedure well without immediate postprocedural complication. FINDINGS: Central pulmonary arteriogram demonstrates significant near occlusive thrombus involving the peripheral aspects of both the right and left main pulmonary arteries with marked dilatation of the central pulmonary arterial system. Additionally, limited right main pulmonary arteriogram demonstrates near occlusive thrombus within the peripheral aspect of the right main pulmonary artery. Acquired pressure measurements: Main  pulmonary artery-61/16; mean-40 (normal: < 25/10) Following the procedure, both ultrasound assisted infusion catheter tips terminate within the distal  aspects of the bilateral lower lobe sub segmental pulmonary arteries. IMPRESSION: 1. Successful fluoroscopic guided initiation of bilateral ultrasound assisted catheter directed pulmonary arterial lysis for sub massive pulmonary embolism and right-sided heart strain. 2. Elevated pressure measurements within the main pulmonary artery compatible with critical pulmonary arterial hypertension. 3. Near occlusive DVT noted within the right common femoral vein. PLAN: Above findings discussed with Dr. PHart Robinsons(critical care) at the time of procedure completion and the decision made to proceed with IVC filter placement for temporary caval interruption purposes at the time of post lysis pressure measurement  acquisition. Electronically Signed   By: Sandi Mariscal M.D.   On: 08/31/2019 08:24   IR IVC FILTER PLMT / S&I Burke Keels GUID/MOD SED  Result Date: 08/31/2019 CLINICAL DATA:  Bilateral submassive pulmonary embolism with cor pulmonale. Status post thrombo lytic infusion via each pulmonary artery for 12 hours. Request has also been made to place an IVC filter on completion thrombo lytic therapy given residual DVT in both lower extremities. EXAM: 1. FINAL DAY ARTERIAL THROMBOLYTIC THERAPY PRESSURE MEASUREMENT. 2. IVC VENOGRAM. 3. PERCUTANEOUS IVC FILTER PLACEMENT. ANESTHESIA/SEDATION: 50 mcg IV Fentanyl. The patient was just given fentanyl and not formally sedated. CONTRAST:  22m OMNIPAQUE IOHEXOL 300 MG/ML SOLN, 163mOMNIPAQUE IOHEXOL 300 MG/ML SOLN, 1019mMNIPAQUE IOHEXOL 300 MG/ML SOLN FLUOROSCOPY TIME:  1 minutes and 30 seconds.  146.6 mGy. PROCEDURE: The procedure, risks, benefits, and alternatives were explained to the patient. Questions regarding the procedure were encouraged and answered. The patient understands and consents to the procedure. A time-out was performed prior to initiating the procedure. Pulmonary artery pressures were directly measured through the pulmonary arterial infusion catheters. The catheters were  then removed. Left femoral vein sheaths placed yesterday were prepped and draped. Local anesthesia was provided with 1% Lidocaine. A guidewire was advanced into the inferior vena cava via one of the sheaths. This sheath was removed and the venotomy dilated. A 10 French deployment sheath was advanced over the guidewire. This was utilized to perform IVC venography. The deployment sheath was further positioned in an appropriate location for filter deployment. A Bard Denali IVC filter was then advanced in the sheath. This was then fully deployed in the infrarenal IVC. Final filter position was confirmed with a fluoroscopic spot image. After the procedure the sheath was removed and hemostasis obtained with manual compression. COMPLICATIONS: None. FINDINGS: Measure pulmonary artery pressure is 55/20, mean 28 mm Hg compared to 61/16, 40 mm Hg prior to lytic therapy. IVC venography demonstrates a normal caliber IVC with no evidence of thrombus. Renal veins are identified bilaterally. The IVC filter was successfully positioned below the level of the renal veins and is appropriately oriented. This IVC filter has both permanent and retrievable indications. IMPRESSION: 1. Decrease in measured pulmonary artery pressure after 12 hours of bilateral pulmonary arterial thrombolytic therapy. 2. Placement of percutaneous IVC filter in infrarenal IVC. IVC venogram shows no evidence of IVC thrombus and normal caliber of the inferior vena cava. This filter does have both permanent and retrievable indications. PLAN: This IVC filter is potentially retrievable. The patient will be assessed for filter retrieval by Interventional Radiology in approximately 8-12 weeks. Further recommendations regarding filter retrieval, continued surveillance or declaration of device permanence, will be made at that time. Electronically Signed   By: GleAletta EdouardD.   On: 08/31/2019 14:25   IR [place] Vasc Access Left  Result Date:  08/31/2019 INDICATION: Sub massive pulmonary embolism. Request made for initiation of bilateral catheter directed pulmonary arterial thrombolysis. EXAM: 1. ULTRASOUND GUIDANCE FOR VENOUS ACCESS X2 2. PULMONARY ARTERIOGRAPHY 3. FLUOROSCOPIC GUIDED PLACEMENT OF BILATERAL PULMONARY ARTERIAL LYTIC INFUSION CATHETERS COMPARISON:  Chest CTA-earlier same day MEDICATIONS: None ANESTHESIA/SEDATION: Moderate (conscious) sedation was employed during this procedure. A total of Versed 2.5 mg and Fentanyl 100 mcg was administered intravenously. Moderate Sedation Time: 33 minutes. The patient's level of consciousness and vital signs were monitored continuously by radiology nursing throughout the procedure under my direct supervision. CONTRAST:  42m21mNIPAQUE IOHEXOL 300 MG/ML  SOLN FLUOROSCOPY TIME:  9 minutes, 18 seconds (141 mGy)  COMPLICATIONS: None immediate. TECHNIQUE: Informed written consent was obtained from the patient after a discussion of the risks, benefits and alternatives to treatment. Questions regarding the procedure were encouraged and answered. A timeout was performed prior to the initiation of the procedure. Ultrasound scanning was performed of the right groin and demonstrated near occlusive thrombus within the right common femoral vein. Sonographic evaluation the left groin demonstrates wide patency of the left common femoral vein. As such, the left common femoral vein was selected venous access. The left groin was prepped and draped in the usual sterile fashion, and a sterile drape was applied covering the operative field. Maximum barrier sterile technique with sterile gowns and gloves were used for the procedure. A timeout was performed prior to the initiation of the procedure. Local anesthesia was provided with 1% lidocaine. Under direct ultrasound guidance, the right common femoral vein was accessed with a micro puncture kit ultimately allowing placement of a 6 French, 35 cm vascular sheath. Slightly  cranial to this initial access, the right common femoral was again accessed with an additional micropuncture kit ultimately allowing placement of an additional 7 French, 35 cm vascular sheath. Ultrasound images were saved for procedural documentation purposes. With the use of a stiff glidewire, a vertebral catheter was advanced into the main pulmonary artery and a limited central pulmonary arteriogram was performed. Pressure measurements were then obtained from the main pulmonary artery. The vertebral catheter was advanced into the distal branch of the left lower lobe pulmonary artery. Limited contrast injection confirmed appropriate positioning. Over an exchange length Rosen wire, the vertebral catheter was exchanged for a 90/10 cm multi side-hole infusion catheter. Again, with the use of a stiff glidewire, a vertebral catheter was advanced into a distal branch of the right lower lobe pulmonary artery. Limited contrast injection confirmed appropriate positioning. Over an exchange length Rosen wire, the pigtail catheter was exchanged for a 90/15 cm multi side-hole infusion catheter. A postprocedural fluoroscopic image was obtained to document final catheter positioning. Both vascular sheath were secured at the left groin. The external catheter tubing was secured at the right thigh and the lytic therapy was initiated. The patient tolerated the procedure well without immediate postprocedural complication. FINDINGS: Central pulmonary arteriogram demonstrates significant near occlusive thrombus involving the peripheral aspects of both the right and left main pulmonary arteries with marked dilatation of the central pulmonary arterial system. Additionally, limited right main pulmonary arteriogram demonstrates near occlusive thrombus within the peripheral aspect of the right main pulmonary artery. Acquired pressure measurements: Main  pulmonary artery-61/16; mean-40 (normal: < 25/10) Following the procedure, both  ultrasound assisted infusion catheter tips terminate within the distal aspects of the bilateral lower lobe sub segmental pulmonary arteries. IMPRESSION: 1. Successful fluoroscopic guided initiation of bilateral ultrasound assisted catheter directed pulmonary arterial lysis for sub massive pulmonary embolism and right-sided heart strain. 2. Elevated pressure measurements within the main pulmonary artery compatible with critical pulmonary arterial hypertension. 3. Near occlusive DVT noted within the right common femoral vein. PLAN: Above findings discussed with Dr. Hart Robinsons (critical care) at the time of procedure completion and the decision made to proceed with IVC filter placement for temporary caval interruption purposes at the time of post lysis pressure measurement acquisition. Electronically Signed   By: Sandi Mariscal M.D.   On: 08/31/2019 08:24   IR US Guide Vasc Access Left  Result Date: 08/31/2019 INDICATION: Sub massive pulmonary embolism. Request made for initiation of bilateral catheter directed pulmonary arterial thrombolysis. EXAM: 1. ULTRASOUND GUIDANCE FOR VENOUS  ACCESS X2 2. PULMONARY ARTERIOGRAPHY 3. FLUOROSCOPIC GUIDED PLACEMENT OF BILATERAL PULMONARY ARTERIAL LYTIC INFUSION CATHETERS COMPARISON:  Chest CTA-earlier same day MEDICATIONS: None ANESTHESIA/SEDATION: Moderate (conscious) sedation was employed during this procedure. A total of Versed 2.5 mg and Fentanyl 100 mcg was administered intravenously. Moderate Sedation Time: 33 minutes. The patient's level of consciousness and vital signs were monitored continuously by radiology nursing throughout the procedure under my direct supervision. CONTRAST:  47m OMNIPAQUE IOHEXOL 300 MG/ML  SOLN FLUOROSCOPY TIME:  9 minutes, 18 seconds (1001mGy) COMPLICATIONS: None immediate. TECHNIQUE: Informed written consent was obtained from the patient after a discussion of the risks, benefits and alternatives to treatment. Questions regarding the procedure were  encouraged and answered. A timeout was performed prior to the initiation of the procedure. Ultrasound scanning was performed of the right groin and demonstrated near occlusive thrombus within the right common femoral vein. Sonographic evaluation the left groin demonstrates wide patency of the left common femoral vein. As such, the left common femoral vein was selected venous access. The left groin was prepped and draped in the usual sterile fashion, and a sterile drape was applied covering the operative field. Maximum barrier sterile technique with sterile gowns and gloves were used for the procedure. A timeout was performed prior to the initiation of the procedure. Local anesthesia was provided with 1% lidocaine. Under direct ultrasound guidance, the right common femoral vein was accessed with a micro puncture kit ultimately allowing placement of a 6 French, 35 cm vascular sheath. Slightly cranial to this initial access, the right common femoral was again accessed with an additional micropuncture kit ultimately allowing placement of an additional 7 French, 35 cm vascular sheath. Ultrasound images were saved for procedural documentation purposes. With the use of a stiff glidewire, a vertebral catheter was advanced into the main pulmonary artery and a limited central pulmonary arteriogram was performed. Pressure measurements were then obtained from the main pulmonary artery. The vertebral catheter was advanced into the distal branch of the left lower lobe pulmonary artery. Limited contrast injection confirmed appropriate positioning. Over an exchange length Rosen wire, the vertebral catheter was exchanged for a 90/10 cm multi side-hole infusion catheter. Again, with the use of a stiff glidewire, a vertebral catheter was advanced into a distal branch of the right lower lobe pulmonary artery. Limited contrast injection confirmed appropriate positioning. Over an exchange length Rosen wire, the pigtail catheter was  exchanged for a 90/15 cm multi side-hole infusion catheter. A postprocedural fluoroscopic image was obtained to document final catheter positioning. Both vascular sheath were secured at the left groin. The external catheter tubing was secured at the right thigh and the lytic therapy was initiated. The patient tolerated the procedure well without immediate postprocedural complication. FINDINGS: Central pulmonary arteriogram demonstrates significant near occlusive thrombus involving the peripheral aspects of both the right and left main pulmonary arteries with marked dilatation of the central pulmonary arterial system. Additionally, limited right main pulmonary arteriogram demonstrates near occlusive thrombus within the peripheral aspect of the right main pulmonary artery. Acquired pressure measurements: Main  pulmonary artery-61/16; mean-40 (normal: < 25/10) Following the procedure, both ultrasound assisted infusion catheter tips terminate within the distal aspects of the bilateral lower lobe sub segmental pulmonary arteries. IMPRESSION: 1. Successful fluoroscopic guided initiation of bilateral ultrasound assisted catheter directed pulmonary arterial lysis for sub massive pulmonary embolism and right-sided heart strain. 2. Elevated pressure measurements within the main pulmonary artery compatible with critical pulmonary arterial hypertension. 3. Near occlusive DVT noted within the right  common femoral vein. PLAN: Above findings discussed with Dr. Hart Robinsons (critical care) at the time of procedure completion and the decision made to proceed with IVC filter placement for temporary caval interruption purposes at the time of post lysis pressure measurement acquisition. Electronically Signed   By: Sandi Mariscal M.D.   On: 08/31/2019 08:24   ECHOCARDIOGRAM COMPLETE  Result Date: 09/11/2019    ECHOCARDIOGRAM REPORT   Patient Name:   Joe Cantu Date of Exam: 09/11/2019 Medical Rec #:  [id number]     Height:       75.0 in  Accession #:    [id number]    Weight:       170.4 lb Date of Birth:  Apr 18, 1951     BSA:          2.050 m Patient Age:    6 years      BP:           116/79 mmHg Patient Gender: M             HR:           77 bpm. Exam Location:  Inpatient Procedure: 2D Echo, Color Doppler and Cardiac Doppler Indications:    I50.9* Heart failure (unspecified)  History:        Patient has prior history of Echocardiogram examinations, most                 recent 08/30/2019. CHF. Acute Massive Pulmonary Embolus on                 08/30/19.  Sonographer:    Raquel Sarna Senior RDCS Referring Phys: [id number] Joe Cantu IMPRESSIONS  1. Left ventricular ejection fraction, by estimation, is 50 to 55%. The left ventricle has low normal function. The left ventricle has no regional wall motion abnormalities. Left ventricular diastolic parameters are consistent with Grade I diastolic dysfunction (impaired relaxation). There is the interventricular septum is flattened in diastole ('D' shaped left ventricle), consistent with right ventricular volume overload.  2. Right ventricular systolic function is normal. The right ventricular size is moderately enlarged. There is moderately elevated pulmonary artery systolic pressure. The estimated right ventricular systolic pressure is 62.1 mmHg.  3. Right atrial size was moderately dilated.  4. The mitral valve is normal in structure and function. Trivial mitral valve regurgitation. No evidence of mitral stenosis.  5. The aortic valve is normal in structure and function. Aortic valve regurgitation is not visualized. No aortic stenosis is present.  6. Aortic dilatation noted. There is mild dilatation of the ascending aorta measuring 37 mm. FINDINGS  Left Ventricle: Left ventricular ejection fraction, by estimation, is 50 to 55%. The left ventricle has low normal function. The left ventricle has no regional wall motion abnormalities. The left ventricular internal cavity size was normal in size. There is no left  ventricular hypertrophy. The interventricular septum is flattened in diastole ('D' shaped left ventricle), consistent with right ventricular volume overload. Left ventricular diastolic parameters are consistent with Grade I diastolic dysfunction (impaired relaxation). Right Ventricle: The right ventricular size is moderately enlarged. No increase in right ventricular wall thickness. Right ventricular systolic function is normal. There is moderately elevated pulmonary artery systolic pressure. The tricuspid regurgitant  velocity is 3.48 m/s, and with an assumed right atrial pressure of 8 mmHg, the estimated right ventricular systolic pressure is 30.8 mmHg. Left Atrium: Left atrial size was normal in size. Right Atrium: Right atrial size was moderately dilated. Pericardium: There is no evidence  of pericardial effusion. Mitral Valve: The mitral valve is normal in structure and function. Trivial mitral valve regurgitation. No evidence of mitral valve stenosis. Tricuspid Valve: The tricuspid valve is normal in structure. Tricuspid valve regurgitation is mild. Aortic Valve: The aortic valve is normal in structure and function. Aortic valve regurgitation is not visualized. No aortic stenosis is present. Pulmonic Valve: The pulmonic valve was normal in structure. Pulmonic valve regurgitation is trivial. Aorta: Aortic dilatation noted. There is mild dilatation of the ascending aorta measuring 37 mm. IAS/Shunts: The atrial septum is grossly normal.  LEFT VENTRICLE PLAX 2D LVIDd:         4.95 cm  Diastology LVIDs:         3.20 cm  LV e' lateral:   9.01 cm/s LV PW:         1.00 cm  LV E/e' lateral: 6.1 LV IVS:        1.02 cm  LV e' medial:    6.68 cm/s LVOT diam:     2.70 cm  LV E/e' medial:  8.3 LV SV:         85 LV SV Index:   42 LVOT Area:     5.73 cm  RIGHT VENTRICLE RV S prime:     13.70 cm/s TAPSE (M-mode): 2.2 cm LEFT ATRIUM             Index       RIGHT ATRIUM           Index LA diam:        3.10 cm 1.51 cm/m  RA  Area:     26.00 cm LA Vol (A2C):   48.4 ml 23.61 ml/m RA Volume:   90.70 ml  44.24 ml/m LA Vol (A4C):   68.4 ml 33.36 ml/m LA Biplane Vol: 60.7 ml 29.61 ml/m  AORTIC VALVE LVOT Vmax:   96.60 cm/s LVOT Vmean:  61.900 cm/s LVOT VTI:    0.149 m                          PULMONARY ARTERY AORTA                    MPA diam:        3.20 cm Ao Root diam: 3.90 cm Ao Asc diam:  3.70 cm MITRAL VALVE               TRICUSPID VALVE MV Area (PHT): 2.79 cm    TR Peak grad:   48.4 mmHg MV Decel Time: 272 msec    TR Vmax:        348.00 cm/s MV E velocity: 55.30 cm/s MV A velocity: 78.40 cm/s  SHUNTS MV E/A ratio:  0.71        Systemic VTI:  0.15 m                            Systemic Diam: 2.70 cm Joe Moores MD Electronically signed by Joe Moores MD Signature Date/Time: 09/11/2019/3:55:30 PM    Final    ECHOCARDIOGRAM COMPLETE  Result Date: 08/30/2019    ECHOCARDIOGRAM REPORT   Patient Name:   Joe Cantu Date of Exam: 08/30/2019 Medical Rec #:  [id number]     Height:       75.0 in Accession #:    [id number]    Weight:       172.0 lb Date of  Birth:  [birth year]     BSA:          2.06 m Patient Age:    34 years      BP:           122/87 mmHg Patient Gender: M             HR:           98 bpm. Exam Location:  Inpatient Procedure: 2D Echo, Cardiac Doppler and Color Doppler STAT ECHO Indications:    R55 Syncope  History:        Patient has no prior history of Echocardiogram examinations.                 Aortic Rood Dilatation.  Sonographer:    Jonelle Sidle Dance Referring Phys: [id number] Steward[place]  1. Severe RA/RV dilation with reduced RV function and severe pulmonary hypertension. LV hyperdynamic and borderline small cavity/underfilled. No hemodynamically significant valve disease.  2. Left ventricular ejection fraction, by estimation, is 65 to 70%. The left ventricle has hyperdynamic function. The left ventricle has no regional wall motion abnormalities. There is moderate concentric left ventricular  hypertrophy. Left ventricular diastolic function could not be evaluated. There is the interventricular septum is flattened in systole and diastole, consistent with right ventricular pressure and volume overload.  3. Right ventricular systolic function is moderately reduced. The right ventricular size is severely enlarged. There is severely elevated pulmonary artery systolic pressure. The estimated right ventricular systolic pressure is 38.1 mmHg.  4. Right atrial size was severely dilated.  5. The mitral valve is normal in structure and function. Trivial mitral valve regurgitation. No evidence of mitral stenosis.  6. Tricuspid valve regurgitation is moderate.  7. The aortic valve is tricuspid. Aortic valve regurgitation is trivial. No aortic stenosis is present.  8. Aortic dilatation noted. There is mild dilatation of the ascending aorta measuring 37 mm.  9. Mildly dilated pulmonary artery. 10. The inferior vena cava is dilated in size with <50% respiratory variability, suggesting right atrial pressure of 15 mmHg. 11. Cannot exclude IAS shunt. Comparison(s): No prior Echocardiogram. FINDINGS  Left Ventricle: Left ventricular ejection fraction, by estimation, is 65 to 70%. The left ventricle has hyperdynamic function. The left ventricle has no regional wall motion abnormalities. The left ventricular internal cavity size was small. There is moderate concentric left ventricular hypertrophy. The interventricular septum is flattened in systole and diastole, consistent with right ventricular pressure and volume overload. Left ventricular diastolic function could not be evaluated. Right Ventricle: The right ventricular size is severely enlarged. Right vetricular wall thickness was not assessed. Right ventricular systolic function is moderately reduced. There is severely elevated pulmonary artery systolic pressure. The tricuspid regurgitant velocity is 3.91 m/s, and with an assumed right atrial pressure of 15 mmHg, the  estimated right ventricular systolic pressure is 01.7 mmHg. Left Atrium: Left atrial size was normal in size. Right Atrium: Right atrial size was severely dilated. Pericardium: There is no evidence of pericardial effusion. Mitral Valve: The mitral valve is normal in structure and function. Trivial mitral valve regurgitation. No evidence of mitral valve stenosis. Tricuspid Valve: The tricuspid valve is normal in structure. Tricuspid valve regurgitation is moderate . No evidence of tricuspid stenosis. Aortic Valve: The aortic valve is tricuspid. Aortic valve regurgitation is trivial. No aortic stenosis is present. Pulmonic Valve: The pulmonic valve was grossly normal. Pulmonic valve regurgitation is mild. No evidence of pulmonic stenosis. Aorta: Aortic dilatation noted. There is mild dilatation  of the ascending aorta measuring 37 mm. Pulmonary Artery: The pulmonary artery is mildly dilated. Venous: The inferior vena cava is dilated in size with less than 50% respiratory variability, suggesting right atrial pressure of 15 mmHg. IAS/Shunts: Cannot exclude IAS shunt. Additional Comments: There is a small pleural effusion in both left and right lateral regions.  LEFT VENTRICLE PLAX 2D LVIDd:         3.33 cm LVIDs:         2.20 cm LV PW:         1.75 cm LV IVS:        2.02 cm LVOT diam:     2.40 cm LV SV:         72.61 ml LV SV Index:   14.28 LVOT Area:     4.52 cm  RIGHT VENTRICLE          IVC RV Basal diam:  4.20 cm  IVC diam: 2.20 cm RV Mid diam:    3.00 cm TAPSE (M-mode): 1.4 cm LEFT ATRIUM             Index       RIGHT ATRIUM           Index LA diam:        3.40 cm 1.65 cm/m  RA Area:     39.50 cm LA Vol (A2C):   42.1 ml 20.46 ml/m RA Volume:   182.50 ml 88.67 ml/m LA Vol (A4C):   43.0 ml 20.89 ml/m LA Biplane Vol: 42.4 ml 20.60 ml/m  AORTIC VALVE LVOT Vmax:   102.40 cm/s LVOT Vmean:  57.500 cm/s LVOT VTI:    0.160 m  AORTA Ao Root diam: 4.20 cm Ao Asc diam:  3.70 cm MV A velocity: 78.45 cm/s  TRICUSPID VALVE                             TR Peak grad:   61.2 mmHg                            TR Vmax:        391.00 cm/s                             SHUNTS                            Systemic VTI:  0.16 m                            Systemic Diam: 2.40 cm Buford Dresser MD Electronically signed by Buford Dresser MD Signature Date/Time: 08/30/2019/12:48:37 PM    Final    VAS [place] LOWER EXTREMITY VENOUS (DVT)  Result Date: 08/30/2019  Lower Venous DVTStudy Indications: Pulmonary embolism, and SOB.  Comparison Study: No prior exam. Performing Technologist: Baldwin Crown ARDMS, RVT  Examination Guidelines: A complete evaluation includes B-mode imaging, spectral Doppler, color Doppler, and power Doppler as needed of all accessible portions of each vessel. Bilateral testing is considered an integral part of a complete examination. Limited examinations for reoccurring indications may be performed as noted. The reflux portion of the exam is performed with the patient in reverse Trendelenburg.  +---------+---------------+---------+-----------+----------+---------------+ RIGHT    CompressibilityPhasicitySpontaneityPropertiesThrombus Aging  +---------+---------------+---------+-----------+----------+---------------+ CFV  Partial        Yes      Yes                                  +---------+---------------+---------+-----------+----------+---------------+ SFJ      None                                                         +---------+---------------+---------+-----------+----------+---------------+ FV Prox  Full                                                         +---------+---------------+---------+-----------+----------+---------------+ FV Mid   Full                                                         +---------+---------------+---------+-----------+----------+---------------+ FV DistalFull                                                          +---------+---------------+---------+-----------+----------+---------------+ PFV      Full                                                         +---------+---------------+---------+-----------+----------+---------------+ POP      Full           Yes      Yes                                  +---------+---------------+---------+-----------+----------+---------------+ PTV      Full                                         seen with color +---------+---------------+---------+-----------+----------+---------------+ PERO     Full                                         seen with color +---------+---------------+---------+-----------+----------+---------------+ EIV      Full           Yes      Yes                                  +---------+---------------+---------+-----------+----------+---------------+   +---------+---------------+---------+-----------+----------+---------------+ LEFT     CompressibilityPhasicitySpontaneityPropertiesThrombus Aging  +---------+---------------+---------+-----------+----------+---------------+ CFV      None  No       No                                   +---------+---------------+---------+-----------+----------+---------------+ SFJ      Full                                                         +---------+---------------+---------+-----------+----------+---------------+ FV Prox  Full                                                         +---------+---------------+---------+-----------+----------+---------------+ FV Mid   Full                                                         +---------+---------------+---------+-----------+----------+---------------+ FV DistalNone                                                         +---------+---------------+---------+-----------+----------+---------------+ PFV      Full                                                          +---------+---------------+---------+-----------+----------+---------------+ POP      None           No       No                                   +---------+---------------+---------+-----------+----------+---------------+ PTV      Full                                         seen with color +---------+---------------+---------+-----------+----------+---------------+ PERO     Full                                         seen with color +---------+---------------+---------+-----------+----------+---------------+ EIV      Full                                                         +---------+---------------+---------+-----------+----------+---------------+ Poorly visusalized bilteral calf veins, visualized with color flow.    Summary: RIGHT: - Findings consistent with acute deep vein  thrombosis involving the SF junction, and right common femoral vein. - Findings consistent with acute superficial vein thrombosis involving the right great saphenous vein.  LEFT: - Findings consistent with acute deep vein thrombosis involving the left common femoral vein, left femoral vein, and left popliteal vein.  *See table(s) above for measurements and observations. Electronically signed by Harold Barban MD on 08/30/2019 at 6:38:23 PM.    Final    US Abdomen Limited RUQ  Result Date: 09/10/2019 CLINICAL DATA:  Transaminitis EXAM: ULTRASOUND ABDOMEN LIMITED RIGHT UPPER QUADRANT COMPARISON:  None. FINDINGS: Gallbladder: No gallstones or wall thickening visualized. No sonographic Murphy sign noted by sonographer. Common bile duct: Diameter: 3.5 mm Liver: No focal lesion identified. Within normal limits in parenchymal echogenicity. Portal vein is patent on color Doppler imaging with normal direction of blood flow towards the liver. Other: None. IMPRESSION: Normal study. Electronically Signed   By: Constance Holster M.D.   On: 09/10/2019 19:29   IR INFUSION THROMBOL ARTERIAL INITIAL (MS)  Result Date:  08/31/2019 INDICATION: Sub massive pulmonary embolism. Request made for initiation of bilateral catheter directed pulmonary arterial thrombolysis. EXAM: 1. ULTRASOUND GUIDANCE FOR VENOUS ACCESS X2 2. PULMONARY ARTERIOGRAPHY 3. FLUOROSCOPIC GUIDED PLACEMENT OF BILATERAL PULMONARY ARTERIAL LYTIC INFUSION CATHETERS COMPARISON:  Chest CTA-earlier same day MEDICATIONS: None ANESTHESIA/SEDATION: Moderate (conscious) sedation was employed during this procedure. A total of Versed 2.5 mg and Fentanyl 100 mcg was administered intravenously. Moderate Sedation Time: 33 minutes. The patient's level of consciousness and vital signs were monitored continuously by radiology nursing throughout the procedure under my direct supervision. CONTRAST:  71m OMNIPAQUE IOHEXOL 300 MG/ML  SOLN FLUOROSCOPY TIME:  9 minutes, 18 seconds (1323mGy) COMPLICATIONS: None immediate. TECHNIQUE: Informed written consent was obtained from the patient after a discussion of the risks, benefits and alternatives to treatment. Questions regarding the procedure were encouraged and answered. A timeout was performed prior to the initiation of the procedure. Ultrasound scanning was performed of the right groin and demonstrated near occlusive thrombus within the right common femoral vein. Sonographic evaluation the left groin demonstrates wide patency of the left common femoral vein. As such, the left common femoral vein was selected venous access. The left groin was prepped and draped in the usual sterile fashion, and a sterile drape was applied covering the operative field. Maximum barrier sterile technique with sterile gowns and gloves were used for the procedure. A timeout was performed prior to the initiation of the procedure. Local anesthesia was provided with 1% lidocaine. Under direct ultrasound guidance, the right common femoral vein was accessed with a micro puncture kit ultimately allowing placement of a 6 French, 35 cm vascular sheath. Slightly  cranial to this initial access, the right common femoral was again accessed with an additional micropuncture kit ultimately allowing placement of an additional 7 French, 35 cm vascular sheath. Ultrasound images were saved for procedural documentation purposes. With the use of a stiff glidewire, a vertebral catheter was advanced into the main pulmonary artery and a limited central pulmonary arteriogram was performed. Pressure measurements were then obtained from the main pulmonary artery. The vertebral catheter was advanced into the distal branch of the left lower lobe pulmonary artery. Limited contrast injection confirmed appropriate positioning. Over an exchange length Rosen wire, the vertebral catheter was exchanged for a 90/10 cm multi side-hole infusion catheter. Again, with the use of a stiff glidewire, a vertebral catheter was advanced into a distal branch of the right lower lobe pulmonary artery. Limited contrast injection confirmed appropriate positioning.  Over an exchange length Rosen wire, the pigtail catheter was exchanged for a 90/15 cm multi side-hole infusion catheter. A postprocedural fluoroscopic image was obtained to document final catheter positioning. Both vascular sheath were secured at the left groin. The external catheter tubing was secured at the right thigh and the lytic therapy was initiated. The patient tolerated the procedure well without immediate postprocedural complication. FINDINGS: Central pulmonary arteriogram demonstrates significant near occlusive thrombus involving the peripheral aspects of both the right and left main pulmonary arteries with marked dilatation of the central pulmonary arterial system. Additionally, limited right main pulmonary arteriogram demonstrates near occlusive thrombus within the peripheral aspect of the right main pulmonary artery. Acquired pressure measurements: Main  pulmonary artery-61/16; mean-40 (normal: < 25/10) Following the procedure, both  ultrasound assisted infusion catheter tips terminate within the distal aspects of the bilateral lower lobe sub segmental pulmonary arteries. IMPRESSION: 1. Successful fluoroscopic guided initiation of bilateral ultrasound assisted catheter directed pulmonary arterial lysis for sub massive pulmonary embolism and right-sided heart strain. 2. Elevated pressure measurements within the main pulmonary artery compatible with critical pulmonary arterial hypertension. 3. Near occlusive DVT noted within the right common femoral vein. PLAN: Above findings discussed with Dr. Hart Robinsons (critical care) at the time of procedure completion and the decision made to proceed with IVC filter placement for temporary caval interruption purposes at the time of post lysis pressure measurement acquisition. Electronically Signed   By: Sandi Mariscal M.D.   On: 08/31/2019 08:24   IR INFUSION THROMBOL ARTERIAL INITIAL (MS)  Result Date: 08/31/2019 INDICATION: Sub massive pulmonary embolism. Request made for initiation of bilateral catheter directed pulmonary arterial thrombolysis. EXAM: 1. ULTRASOUND GUIDANCE FOR VENOUS ACCESS X2 2. PULMONARY ARTERIOGRAPHY 3. FLUOROSCOPIC GUIDED PLACEMENT OF BILATERAL PULMONARY ARTERIAL LYTIC INFUSION CATHETERS COMPARISON:  Chest CTA-earlier same day MEDICATIONS: None ANESTHESIA/SEDATION: Moderate (conscious) sedation was employed during this procedure. A total of Versed 2.5 mg and Fentanyl 100 mcg was administered intravenously. Moderate Sedation Time: 33 minutes. The patient's level of consciousness and vital signs were monitored continuously by radiology nursing throughout the procedure under my direct supervision. CONTRAST:  21m OMNIPAQUE IOHEXOL 300 MG/ML  SOLN FLUOROSCOPY TIME:  9 minutes, 18 seconds (1245mGy) COMPLICATIONS: None immediate. TECHNIQUE: Informed written consent was obtained from the patient after a discussion of the risks, benefits and alternatives to treatment. Questions regarding the  procedure were encouraged and answered. A timeout was performed prior to the initiation of the procedure. Ultrasound scanning was performed of the right groin and demonstrated near occlusive thrombus within the right common femoral vein. Sonographic evaluation the left groin demonstrates wide patency of the left common femoral vein. As such, the left common femoral vein was selected venous access. The left groin was prepped and draped in the usual sterile fashion, and a sterile drape was applied covering the operative field. Maximum barrier sterile technique with sterile gowns and gloves were used for the procedure. A timeout was performed prior to the initiation of the procedure. Local anesthesia was provided with 1% lidocaine. Under direct ultrasound guidance, the right common femoral vein was accessed with a micro puncture kit ultimately allowing placement of a 6 French, 35 cm vascular sheath. Slightly cranial to this initial access, the right common femoral was again accessed with an additional micropuncture kit ultimately allowing placement of an additional 7 French, 35 cm vascular sheath. Ultrasound images were saved for procedural documentation purposes. With the use of a stiff glidewire, a vertebral catheter was advanced into the main pulmonary  artery and a limited central pulmonary arteriogram was performed. Pressure measurements were then obtained from the main pulmonary artery. The vertebral catheter was advanced into the distal branch of the left lower lobe pulmonary artery. Limited contrast injection confirmed appropriate positioning. Over an exchange length Rosen wire, the vertebral catheter was exchanged for a 90/10 cm multi side-hole infusion catheter. Again, with the use of a stiff glidewire, a vertebral catheter was advanced into a distal branch of the right lower lobe pulmonary artery. Limited contrast injection confirmed appropriate positioning. Over an exchange length Rosen wire, the pigtail  catheter was exchanged for a 90/15 cm multi side-hole infusion catheter. A postprocedural fluoroscopic image was obtained to document final catheter positioning. Both vascular sheath were secured at the left groin. The external catheter tubing was secured at the right thigh and the lytic therapy was initiated. The patient tolerated the procedure well without immediate postprocedural complication. FINDINGS: Central pulmonary arteriogram demonstrates significant near occlusive thrombus involving the peripheral aspects of both the right and left main pulmonary arteries with marked dilatation of the central pulmonary arterial system. Additionally, limited right main pulmonary arteriogram demonstrates near occlusive thrombus within the peripheral aspect of the right main pulmonary artery. Acquired pressure measurements: Main  pulmonary artery-61/16; mean-40 (normal: < 25/10) Following the procedure, both ultrasound assisted infusion catheter tips terminate within the distal aspects of the bilateral lower lobe sub segmental pulmonary arteries. IMPRESSION: 1. Successful fluoroscopic guided initiation of bilateral ultrasound assisted catheter directed pulmonary arterial lysis for sub massive pulmonary embolism and right-sided heart strain. 2. Elevated pressure measurements within the main pulmonary artery compatible with critical pulmonary arterial hypertension. 3. Near occlusive DVT noted within the right common femoral vein. PLAN: Above findings discussed with Dr. Hart Robinsons (critical care) at the time of procedure completion and the decision made to proceed with IVC filter placement for temporary caval interruption purposes at the time of post lysis pressure measurement acquisition. Electronically Signed   By: Sandi Mariscal M.D.   On: 08/31/2019 08:24   IR THROMB F/U EVAL ART/VEN FINAL DAY (MS)  Result Date: 08/31/2019 CLINICAL DATA:  Bilateral submassive pulmonary embolism with cor pulmonale. Status post thrombo lytic  infusion via each pulmonary artery for 12 hours. Request has also been made to place an IVC filter on completion thrombo lytic therapy given residual DVT in both lower extremities. EXAM: 1. FINAL DAY ARTERIAL THROMBOLYTIC THERAPY PRESSURE MEASUREMENT. 2. IVC VENOGRAM. 3. PERCUTANEOUS IVC FILTER PLACEMENT. ANESTHESIA/SEDATION: 50 mcg IV Fentanyl. The patient was just given fentanyl and not formally sedated. CONTRAST:  87m OMNIPAQUE IOHEXOL 300 MG/ML SOLN, 164mOMNIPAQUE IOHEXOL 300 MG/ML SOLN, 1028mMNIPAQUE IOHEXOL 300 MG/ML SOLN FLUOROSCOPY TIME:  1 minutes and 30 seconds.  146.6 mGy. PROCEDURE: The procedure, risks, benefits, and alternatives were explained to the patient. Questions regarding the procedure were encouraged and answered. The patient understands and consents to the procedure. A time-out was performed prior to initiating the procedure. Pulmonary artery pressures were directly measured through the pulmonary arterial infusion catheters. The catheters were then removed. Left femoral vein sheaths placed yesterday were prepped and draped. Local anesthesia was provided with 1% Lidocaine. A guidewire was advanced into the inferior vena cava via one of the sheaths. This sheath was removed and the venotomy dilated. A 10 French deployment sheath was advanced over the guidewire. This was utilized to perform IVC venography. The deployment sheath was further positioned in an appropriate location for filter deployment. A Bard Denali IVC filter was then advanced in the sheath.  This was then fully deployed in the infrarenal IVC. Final filter position was confirmed with a fluoroscopic spot image. After the procedure the sheath was removed and hemostasis obtained with manual compression. COMPLICATIONS: None. FINDINGS: Measure pulmonary artery pressure is 55/20, mean 28 mm Hg compared to 61/16, 40 mm Hg prior to lytic therapy. IVC venography demonstrates a normal caliber IVC with no evidence of thrombus. Renal veins  are identified bilaterally. The IVC filter was successfully positioned below the level of the renal veins and is appropriately oriented. This IVC filter has both permanent and retrievable indications. IMPRESSION: 1. Decrease in measured pulmonary artery pressure after 12 hours of bilateral pulmonary arterial thrombolytic therapy. 2. Placement of percutaneous IVC filter in infrarenal IVC. IVC venogram shows no evidence of IVC thrombus and normal caliber of the inferior vena cava. This filter does have both permanent and retrievable indications. PLAN: This IVC filter is potentially retrievable. The patient will be assessed for filter retrieval by Interventional Radiology in approximately 8-12 weeks. Further recommendations regarding filter retrieval, continued surveillance or declaration of device permanence, will be made at that time. Electronically Signed   By: Aletta Edouard M.D.   On: 08/31/2019 14:25    Lab Data:  CBC: Recent Labs  Lab 09/10/19 1542 09/11/19 0317 09/12/19 0148  WBC 14.2* 16.7* 13.3*  NEUTROABS 12.3*  --   --   HGB 10.4* 11.0* 10.4*  HCT 31.8* 32.8* 32.4*  MCV 93.8 92.7 95.9  PLT 312 328 812   Basic Metabolic Panel: Recent Labs  Lab 09/10/19 1542 09/10/19 2026 09/11/19 0317 09/12/19 0148  NA 132*  --  139 138  K 3.3*  --  4.7 3.8  CL 101  --  105 107  CO2 23  --  24 21*  GLUCOSE 124*  --  110* 113*  BUN 17  --  15 15  CREATININE 0.92  --  0.88 0.83  CALCIUM 8.8*  --  8.9 9.3  MG  --  1.9  --   --    GFR: Estimated Creatinine Clearance: 95.7 mL/min (by C-G formula based on SCr of 0.83 mg/dL). Liver Function Tests: Recent Labs  Lab 09/10/19 1542 09/11/19 0317 09/12/19 0643  AST 134* 70* 41  ALT 110* 89* 69*  ALKPHOS 211* 173* 152*  BILITOT 1.3* 1.3* 1.8*  PROT 6.5 6.0* 5.8*  ALBUMIN 2.3* 2.2* 2.0*   Recent Labs  Lab 09/10/19 1542  LIPASE 31   No results for input(s): AMMONIA in the last 168 hours. Coagulation Profile: Recent Labs  Lab  09/10/19 1542 09/12/19 0643  INR 2.7* 1.3*   Cardiac Enzymes: No results for input(s): CKTOTAL, CKMB, CKMBINDEX, TROPONINI in the last 168 hours. BNP (last 3 results) No results for input(s): PROBNP in the last 8760 hours. HbA1C: No results for input(s): HGBA1C in the last 72 hours. CBG: No results for input(s): GLUCAP in the last 168 hours. Lipid Profile: No results for input(s): CHOL, HDL, LDLCALC, TRIG, CHOLHDL, LDLDIRECT in the last 72 hours. Thyroid Function Tests: No results for input(s): TSH, T4TOTAL, FREET4, T3FREE, THYROIDAB in the last 72 hours. Anemia Panel: No results for input(s): VITAMINB12, FOLATE, FERRITIN, TIBC, IRON, RETICCTPCT in the last 72 hours. Urine analysis: No results found for: COLORURINE, APPEARANCEUR, LABSPEC, PHURINE, GLUCOSEU, HGBUR, BILIRUBINUR, KETONESUR, PROTEINUR, UROBILINOGEN, NITRITE, LEUKOCYTESUR   Ciearra Rufo M.D. [facility] 09/12/2019, 12:27 PM   Call night coverage person covering after 7pm

## 2019-09-12 NOTE — Progress Notes (Signed)
NAME:  Joe Cantu, MRN:  [id number], DOB:  11-06-1950, LOS: 2 ADMISSION DATE:  09/10/2019, CONSULTATION DATE:  09/09/2018 REFERRING MD:  Tyler Pita MD, CHIEF COMPLAINT:  PE, Hemoptysis  Brief History   Consult for hemoptysis Hospitalized after syncopal episode and was found to have massive bilateral PE with right heart strain. Received catheter directed TPA on 2/17 and IVC filter was placed on 2/18. Patient was initially placed on heparin drip, subsequently transitioned to Xarelto and f/u with NP Volanda Napoleon  2/26 continued doe s 02 need but no cp, hemoptysis then acute onset hemoptysis p eating lunch ? Gagged?  on day of admit x ? 150cc of dark blood to brought to ER where no drop in hgb and no acute change on cxr and PCCM service asked to consult.  Note w/u also c/w ?liver dz with INR elevation.  Note pt denies any increase sob with onset of hemoptysis, also no cp, no sob over baseline, no assoc epistaxis. Has not had any recurrent bleeding since arrival with mostly dry cough.   Past Medical History    has a past medical history of Aortic root dilatation ([place]) (02/21/2019), Dyspnea on exertion, Frequent unifocal PVCs (02/21/2019), Glaucoma, Gonorrhea, Left inguinal hernia, Pneumonia, Ventricular premature beats, and Wears dentures.  Significant Hospital Events   2/28 Admit  Consults:  PCCM, hematology  Procedures:    Significant Diagnostic Tests:  Right upper quadrant ultrasound 09/10/2019- Normal study  CTA 09/11/2019-PEs which are slightly decreased in size.  Mild bibasal atelectasis/consolidation.  Echocardiogram 09/11/2019-EF 62-70%, grade 1 diastolic dysfunction edema overload, moderate pulmonary hypertension, RVSP 56.4. (RVSP was 76 on 2/17)  Micro Data:  Blood culture 2/28- E. coli  Antimicrobials:  Zosyn 2/28 >>  Interim history/subjective:  Had 1 more episode of hemoptysis today morning.  Coughed up about 10-20 cc of blood. Now growing E. coli in blood culture.  Objective    Blood pressure 112/78, pulse 81, temperature 97.9 F (36.6 C), temperature source Oral, resp. rate (!) 22, height _0  (1.905 m), weight 77.3 kg, SpO2 100 %.        Intake/Output Summary (Last 24 hours) at 09/12/2019 0741 Last data filed at 09/12/2019 0400 Gross per 24 hour  Intake 22.54 ml  Output 1425 ml  Net -1402.46 ml   Filed Weights   09/10/19 1537 09/10/19 2054  Weight: 77.1 kg 77.3 kg    Examination: Blood pressure 112/78, pulse 81, temperature 97.9 F (36.6 C), temperature source Oral, resp. rate (!) 22, height _1  (1.905 m), weight 77.3 kg, SpO2 100 %. Gen:      No acute distress HEENT:  EOMI, sclera anicteric Neck:     No masses; no thyromegaly Lungs:    Clear to auscultation bilaterally; normal respiratory effort CV:         Regular rate and rhythm; no murmurs Abd:      + bowel sounds; soft, non-tender; no palpable masses, no distension Ext:    No edema; adequate peripheral perfusion Skin:      Warm and dry; no rash Neuro: alert and oriented x 3 Psych: normal mood and affect  Resolved Hospital Problem list     Assessment & Plan:  Hemoptysis in the setting of anticoagulation Concern for aspiration pneumonitis still CT scan is not really remarkable for this Plan for bronchoscopy today to examine airways  DVT, PE, factor V Leyden heterozygote S/p lytics and IVC filter at last admission He is at increased risk for recurrence of clot  Ideally would like indefinite anticoagulation Try lower dose of anticoagulation as recommended by hematology after discussion of risk benefit with patient.   Elevated INR and LFTs No alcohol use.  Right upper quadrant ultrasound is unremarkable Follow-up hepatitis panel, coags  E. coli bacteremia with unclear source.  Continue IV Zosyn.  Follow cultures We will send BAL today and bronchus   Marshell Garfinkel MD [facility] Please see [email or web address] for pager details.  09/12/2019, 7:52 AM

## 2019-09-12 NOTE — Progress Notes (Addendum)
PCCM progress note  Reviewed findings of bronchoscopy with patient and his sister Dois Davenport at bedside today There is no evidence of significant ongoing bleed at present  Discussed recommendations of hematology about reduced dose Xarelto Sherrine Maples and Dois Davenport are aware that this is a compromise to balance risk of bleeding and need for treatment of PE, to prevent recurrent clot.  Risk/benefit of this approach reviewed in detail with them and they have agreed to the plan  Discussed with Dr. Darnelle Catalan as well Per previous recommendations we will start Xarelto at 15 mg/day for about 30 days and then transition to 10 mg/day. Monitor for recurrence of bleed.  Follow-up in pulmonary and hematology clinic.  He will also need follow-up with Dr. Jacinto Halim, cardiology for monitoring RV dysfunction and cor pulmonale  Chilton Greathouse MD [facility] Please see [email or web address] for pager details.  09/12/2019, 6:32 PM

## 2019-09-12 NOTE — Progress Notes (Signed)
Pt has started to cough up blood secure message sent to on call. Awaiting for new order if any. Will continue to monitor.

## 2019-09-13 ENCOUNTER — Inpatient Hospital Stay (HOSPITAL_COMMUNITY): Payer: BC Managed Care – PPO

## 2019-09-13 ENCOUNTER — Telehealth: Payer: Self-pay | Admitting: Oncology

## 2019-09-13 ENCOUNTER — Encounter: Payer: Self-pay | Admitting: *Deleted

## 2019-09-13 ENCOUNTER — Telehealth: Payer: Self-pay | Admitting: Pulmonary Disease

## 2019-09-13 LAB — BASIC METABOLIC PANEL
Anion gap: 9 (ref 5–15)
BUN: 14 mg/dL (ref 8–23)
CO2: 19 mmol/L — ABNORMAL LOW (ref 22–32)
Calcium: 8.8 mg/dL — ABNORMAL LOW (ref 8.9–10.3)
Chloride: 107 mmol/L (ref 98–111)
Creatinine, Ser: 0.76 mg/dL (ref 0.61–1.24)
GFR calc Af Amer: >60 mL/min (ref >60)
GFR calc non Af Amer: >60 mL/min (ref >60)
Glucose, Bld: 124 mg/dL — ABNORMAL HIGH (ref 70–99)
Potassium: 3.4 mmol/L — ABNORMAL LOW (ref 3.5–5.1)
Sodium: 135 mmol/L (ref 135–145)

## 2019-09-13 LAB — CULTURE, BLOOD (ROUTINE X 2): Special Requests: ADEQUATE

## 2019-09-13 LAB — CBC
HCT: 31.2 % — ABNORMAL LOW (ref 39.0–52.0)
Hemoglobin: 9.8 g/dL — ABNORMAL LOW (ref 13.0–17.0)
MCH: 30 pg (ref 26.0–34.0)
MCHC: 31.4 g/dL (ref 30.0–36.0)
MCV: 95.4 fL (ref 80.0–100.0)
Platelets: 308 10*3/uL (ref 150–400)
RBC: 3.27 MIL/uL — ABNORMAL LOW (ref 4.22–5.81)
RDW: 13.7 % (ref 11.5–15.5)
WBC: 12.2 10*3/uL — ABNORMAL HIGH (ref 4.0–10.5)
nRBC: 0 % (ref 0.0–0.2)

## 2019-09-13 LAB — URINE CULTURE: Culture: 80000 — AB

## 2019-09-13 LAB — HEMOGLOBIN AND HEMATOCRIT, BLOOD
HCT: 33.5 % — ABNORMAL LOW (ref 39.0–52.0)
Hemoglobin: 10.7 g/dL — ABNORMAL LOW (ref 13.0–17.0)

## 2019-09-13 LAB — CYTOLOGY - NON PAP

## 2019-09-13 MED ORDER — SODIUM CHLORIDE 0.9 % IV SOLN
2.0000 g | INTRAVENOUS | Status: AC
  Administered 2019-09-13 – 2019-09-16 (×3): 2 g via INTRAVENOUS
  Filled 2019-09-13: qty 2
  Filled 2019-09-13: qty 20
  Filled 2019-09-13: qty 2
  Filled 2019-09-13 (×2): qty 20

## 2019-09-13 MED ORDER — POTASSIUM CHLORIDE 20 MEQ/15ML (10%) PO SOLN
40.0000 meq | ORAL | Status: AC
  Administered 2019-09-13 (×2): 40 meq via ORAL
  Filled 2019-09-13: qty 30

## 2019-09-13 MED ORDER — POLYETHYLENE GLYCOL 3350 17 G PO PACK
17.0000 g | PACK | Freq: Every day | ORAL | Status: DC
  Administered 2019-09-13 – 2019-09-18 (×6): 17 g via ORAL
  Filled 2019-09-13 (×6): qty 1

## 2019-09-13 NOTE — Progress Notes (Addendum)
NAME:  Joe Cantu, MRN:  [id number], DOB:  December 19, 1950, LOS: 3 ADMISSION DATE:  09/10/2019, CONSULTATION DATE:  09/09/2018 REFERRING MD:  Tyler Pita MD, CHIEF COMPLAINT:  PE, Hemoptysis  Brief History   Consult for hemoptysis Hospitalized after syncopal episode and was found to have massive bilateral PE with right heart strain. Received catheter directed TPA on 2/17 and IVC filter was placed on 2/18. Patient was initially placed on heparin drip, subsequently transitioned to Xarelto and f/u with NP Volanda Napoleon  2/26 continued doe s 02 need but no cp, hemoptysis then acute onset hemoptysis p eating lunch ? Gagged?  on day of admit x ? 150cc of dark blood to brought to ER where no drop in hgb and no acute change on cxr and PCCM service asked to consult.  Note w/u also c/w ?liver dz with INR elevation.  Note pt denies any increase sob with onset of hemoptysis, also no cp, no sob over baseline, no assoc epistaxis. Has not had any recurrent bleeding since arrival with mostly dry cough.   Past Medical History    has a past medical history of Aortic root dilatation ([place]) (02/21/2019), Dyspnea on exertion, Frequent unifocal PVCs (02/21/2019), Glaucoma, Gonorrhea, Left inguinal hernia, Pneumonia, Ventricular premature beats, and Wears dentures.  Significant Hospital Events   2/28 Admit  Consults:  PCCM, hematology  Procedures:    Significant Diagnostic Tests:  Right upper quadrant ultrasound 09/10/2019- Normal study  CTA 09/11/2019-PEs which are slightly decreased in size.  Mild bibasal atelectasis/consolidation.  Echocardiogram 09/11/2019-EF 38-10%, grade 1 diastolic dysfunction edema overload, moderate pulmonary hypertension, RVSP 56.4. (RVSP was 76 on 2/17)  Micro Data:  Blood culture 2/28- E. coli  Antimicrobials:  Zosyn 2/28 >>  Interim history/subjective:  No hemoptysis overnight. States breathing is good. Feels a bit weak. Wants diet advanced.  Objective   Blood pressure (!) 115/92,  pulse 76, temperature 97.9 F (36.6 C), temperature source Oral, resp. rate (!) 28, height 6\' 3"  (1.905 m), weight 77.3 kg, SpO2 100 %.        Intake/Output Summary (Last 24 hours) at 09/13/2019 0747 Last data filed at 09/13/2019 0744 Gross per 24 hour  Intake 128.45 ml  Output 1850 ml  Net -1721.55 ml   Filed Weights   09/10/19 1537 09/10/19 2054 09/12/19 0846  Weight: 77.1 kg 77.3 kg 77.3 kg    Examination: Today's Vitals   09/13/19 0348 09/13/19 0400 09/13/19 0600 09/13/19 0744  BP:  121/82 (!) 115/92   Pulse:  81 76   Resp:  (!) 24 (!) 28   Temp: 97.9 F (36.6 C)     TempSrc: Oral     SpO2:  100% 100%   Weight:      Height:      PainSc:  0-No pain  0-No pain   Body mass index is 21.3 kg/m. GEN: thin man in NAD HEENT: MM dry, trachea midline CV: RRR, ext warm PULM: Clear, no accessory muscle use GI: Soft, +BS EXT: No edema NEURO: Moves all 4 ext to command PSYCH: RASS 0, fair insight SKIN: No rashes  Minimal transaminitis Low K: repleted H/H stable   Resolved Hospital Problem list     Assessment & Plan:  Hemoptysis in the setting of anticoagulation - Imaging, history, and bronch more c/w either nosebleed or GI source that has resolved - If recurrence, low threshold for EGD +/- examined R nares (left examined by bronch: nothing noted) - f/u bronch cultures - Will arrange f/u  in clinic with PM in ~4 weeks  Recent submassive PE s/p cdTPA 2/17, IVC filter, factor 5 leiden - Needs filter out sometime in next 6 mo - Reduced dose xarelto per previous discussions - Will need a repeat echo in about 3-4 months - Lifelong AC recommended  E Coli bacteremia- source unclear at this time - Would check UA  - Needs colonoscopy at some point - Switch zosyn to ceftriaxone x 6 more days, should be about 7-8 days coverage, can do PO if going home  Will sign off, please call if we can be of further help.  Myrla Halsted MD PCCM

## 2019-09-13 NOTE — Telephone Encounter (Signed)
Scheduled appt per 3/3 sch message - pt aware of appt added

## 2019-09-13 NOTE — Telephone Encounter (Signed)
-----   Message -----  From: Lorin Glass, MD  Sent: 09/13/2019  8:02 AM EST  To: Chilton Greathouse, MD, [facility] Triage Pool  Subject: 4-6 week hospital f/u with PM or NP        thanks     Staff message received from Dr. Katrinka Blazing which is posted above. Pt is still in hospital so scheduling follow up hospital appt so it will be seen on AVS.  Scheduled pt an appt with Dr. Elberta Fortis. April 8 at 10:30. Nothing further needed.

## 2019-09-13 NOTE — Progress Notes (Signed)
PROGRESS NOTE    TRAVIAN KERNER  VOJ:[id number] DOB: Apr 26, 1951 DOA: 09/10/2019 PCP: Iona Beard, MD   Brief Narrative: Joe Cantu is a 69 y.o. male with a history of frequent PVCs, massive PE, left lower extremity DVT s/p IVC filter, factor v Leiden (heterozygous). Patient presented secondary to hemoptysis in setting of Xarelto use for PE.   Assessment & Plan:   Principal Problem:   Hemoptysis Active Problems:   Aortic root dilatation (HCC)   Frequent unifocal PVCs   Acute massive pulmonary embolism (HCC)   DVT (deep venous thrombosis) (HCC)   Hemoptysis In setting of recent initiation of anticoagulation and coughing spells. Xarelto initially held and pulmonology consulted. No recurrent hemoptysis and hemoglobin has been stable. Pulmonology performed a bronchoscopy on 3/2 which was significant for no bleeding. Oncology was consulted for recommendations on Xarelto dose in setting of acute PE (massive) in addition to extensive LE DVT s/p IVC filter and recommendation to decrease dose to Xarelto 15 mg daily to complete currently available prescription followed by reduction to Xarelto 10 mg. -Continue Xarelto 15 mg daily -PT eval -BAL results pending  History of massive PE History of left LE DVT Factor V Leiden, heterozygous Patient was on Xarelto 15 mg BID prior to admission. IVC filter also placed prior to admission. Patient's Xarelto dose has been adjusted as mentioned above to Xarelto 15 mg daily followed by Xarelto 10 mg daily. Patient has follow up with hematology scheduled.  Acute transaminitis In setting of massive PE and RV dysfunction. Improved.  Frequent PVCs -Continue metoprolol  Prolonged QTc Noted.  Hypokalemia Resolved.   DVT prophylaxis: Xarelto Code Status:   Code Status: Full Code Family Communication: None at bedside Disposition Plan: Discharge tomorrow pending PT and no recurrent hemoptysis after initiating Xarelto   Consultants:    Pulmonology  Hematology  Procedures:   BRONCHOSCOPY (3/2) Impression:      - Hemoptysis      - The airway examination did not show endbronchial abnormalities or       active bleeding. Blood tinged mucus and old blood noted in the bilateral       airways.      - Bronchoalveolar lavage was performed.  Antimicrobials:  None    Subjective: Some dyspnea, otherwise no issues.  Objective: Vitals:   09/13/19 0348 09/13/19 0400 09/13/19 0600 09/13/19 0800  BP:  121/82 (!) 115/92   Pulse:  81 76   Resp:  (!) 24 (!) 28   Temp: 97.9 F (36.6 C)   (!) 97.5 F (36.4 C)  TempSrc: Oral   Oral  SpO2:  100% 100%   Weight:      Height:        Intake/Output Summary (Last 24 hours) at 09/13/2019 1008 Last data filed at 09/13/2019 0752 Gross per 24 hour  Intake 128.45 ml  Output 2150 ml  Net -2021.55 ml   Filed Weights   09/10/19 1537 09/10/19 2054 09/12/19 0846  Weight: 77.1 kg 77.3 kg 77.3 kg    Examination:  General exam: Appears calm and comfortable Respiratory system: Rales. Respiratory effort normal. Cardiovascular system: S1 & S2 heard, RRR. No murmurs, rubs, gallops or clicks. Gastrointestinal system: Abdomen is nondistended, soft and nontender. No organomegaly or masses felt. Normal bowel sounds heard. Central nervous system: Alert and oriented. No focal neurological deficits. Extremities: No edema. No calf tenderness Skin: No cyanosis. No rashes Psychiatry: Judgement and insight appear normal. Mood & affect appropriate.     Data  Reviewed: I have personally reviewed following labs and imaging studies  CBC: Recent Labs  Lab 09/10/19 1542 09/11/19 0317 09/12/19 0148 09/13/19 0255  WBC 14.2* 16.7* 13.3* 12.2*  NEUTROABS 12.3*  --   --   --   HGB 10.4* 11.0* 10.4* 9.8*  HCT 31.8* 32.8* 32.4* 31.2*  MCV 93.8 92.7 95.9 95.4  PLT 312 328 325 867   Basic Metabolic Panel: Recent Labs  Lab 09/10/19 1542 09/10/19 2026 09/11/19 0317 09/12/19 0148  09/13/19 0255  NA 132*  --  139 138 135  K 3.3*  --  4.7 3.8 3.4*  CL 101  --  105 107 107  CO2 23  --  24 21* 19*  GLUCOSE 124*  --  110* 113* 124*  BUN 17  --  _0 CREATININE 0.92  --  0.88 0.83 0.76  CALCIUM 8.8*  --  8.9 9.3 8.8*  MG  --  1.9  --   --   --    GFR: Estimated Creatinine Clearance: 99.3 mL/min (by C-G formula based on SCr of 0.76 mg/dL). Liver Function Tests: Recent Labs  Lab 09/10/19 1542 09/11/19 0317 09/12/19 0643  AST 134* 70* 41  ALT 110* 89* 69*  ALKPHOS 211* 173* 152*  BILITOT 1.3* 1.3* 1.8*  PROT 6.5 6.0* 5.8*  ALBUMIN 2.3* 2.2* 2.0*   Recent Labs  Lab 09/10/19 1542  LIPASE 31   No results for input(s): AMMONIA in the last 168 hours. Coagulation Profile: Recent Labs  Lab 09/10/19 1542 09/12/19 0643  INR 2.7* 1.3*   Cardiac Enzymes: No results for input(s): CKTOTAL, CKMB, CKMBINDEX, TROPONINI in the last 168 hours. BNP (last 3 results) No results for input(s): PROBNP in the last 8760 hours. HbA1C: No results for input(s): HGBA1C in the last 72 hours. CBG: No results for input(s): GLUCAP in the last 168 hours. Lipid Profile: No results for input(s): CHOL, HDL, LDLCALC, TRIG, CHOLHDL, LDLDIRECT in the last 72 hours. Thyroid Function Tests: No results for input(s): TSH, T4TOTAL, FREET4, T3FREE, THYROIDAB in the last 72 hours. Anemia Panel: No results for input(s): VITAMINB12, FOLATE, FERRITIN, TIBC, IRON, RETICCTPCT in the last 72 hours. Sepsis Labs: Recent Labs  Lab 09/10/19 2026  PROCALCITON 1.53    Recent Results (from the past 240 hour(s))  SARS CORONAVIRUS 2 (TAT 6-24 HRS) Nasopharyngeal Nasopharyngeal Swab     Status: None   Collection Time: 09/10/19  5:00 PM   Specimen: Nasopharyngeal Swab  Result Value Ref Range Status   SARS Coronavirus 2 NEGATIVE NEGATIVE Final    Comment: (NOTE) SARS-CoV-2 target nucleic acids are NOT DETECTED. The SARS-CoV-2 RNA is generally detectable in upper and lower respiratory  specimens during the acute phase of infection. Negative results do not preclude SARS-CoV-2 infection, do not rule out co-infections with other pathogens, and should not be used as the sole basis for treatment or other patient management decisions. Negative results must be combined with clinical observations, patient history, and epidemiological information. The expected result is Negative. Fact Sheet for Patients: SugarRoll.be Fact Sheet for Healthcare Providers: https://www.woods-mathews.com/ This test is not yet approved or cleared by the [place] FDA and  has been authorized for detection and/or diagnosis of SARS-CoV-2 by FDA under an Emergency Use Authorization (EUA). This EUA will remain  in effect (meaning this test can be used) for the duration of the COVID-19 declaration under Section 56 4(b)(1) of the Act, 21 U.S.C. section 360bbb-3(b)(1), unless the authorization is terminated or revoked sooner.  Performed at [facility], [place] [address], [place], [place] [address]   MRSA PCR Screening     Status: None   Collection Time: 09/10/19  8:05 PM   Specimen: Nasal Mucosa; Nasopharyngeal  Result Value Ref Range Status   MRSA by PCR NEGATIVE NEGATIVE Final    Comment:        The GeneXpert MRSA Assay (FDA approved for NASAL specimens only), is one component of a comprehensive MRSA colonization surveillance program. It is not intended to diagnose MRSA infection nor to guide or monitor treatment for MRSA infections. Performed at [facility], [place] [address], [place], [place] [address]   Culture, blood (Routine X 2) w Reflex to ID Panel     Status: Abnormal   Collection Time: 09/10/19  8:26 PM   Specimen: BLOOD  Result Value Ref Range Status   Specimen Description   Final    BLOOD RIGHT ANTECUBITAL Performed at [facility] [address], [place], [place] [address]    Special Requests    Final    BOTTLES DRAWN AEROBIC ONLY Blood Culture adequate volume Performed at [facility] [address] [place], [place] [address]    Culture  Setup Time   Final    AEROBIC BOTTLE ONLY GRAM NEGATIVE RODS CRITICAL RESULT CALLED TO, READ BACK BY AND VERIFIED WITH: L POINDEXTER PHARMD 09/11/19 2021 JDW Performed at [facility], [place] [address], [place], [place] [address]    Culture ESCHERICHIA COLI (A)  Final   Report Status 09/13/2019 FINAL  Final   Organism ID, Bacteria ESCHERICHIA COLI  Final      Susceptibility   Escherichia coli - MIC*    AMPICILLIN <=2 SENSITIVE Sensitive     CEFAZOLIN <=4 SENSITIVE Sensitive     CEFEPIME <=0.12 SENSITIVE Sensitive     CEFTAZIDIME <=1 SENSITIVE Sensitive     CEFTRIAXONE <=0.25 SENSITIVE Sensitive     CIPROFLOXACIN <=0.25 SENSITIVE Sensitive     GENTAMICIN <=1 SENSITIVE Sensitive     IMIPENEM <=0.25 SENSITIVE Sensitive     TRIMETH/SULFA <=20 SENSITIVE Sensitive     AMPICILLIN/SULBACTAM <=2 SENSITIVE Sensitive     PIP/TAZO <=4 SENSITIVE Sensitive     * ESCHERICHIA COLI  Culture, blood (Routine X 2) w Reflex to ID Panel     Status: Abnormal   Collection Time: 09/10/19  8:26 PM   Specimen: BLOOD  Result Value Ref Range Status   Specimen Description   Final    BLOOD RIGHT PORTA CATH Performed at [facility] [address], [place], [place] [address]    Special Requests   Final    BOTTLES DRAWN AEROBIC ONLY Blood Culture results may not be optimal due to an inadequate volume of blood received in culture bottles Performed at [facility] [address], [place], [place] [address]    Culture  Setup Time   Final    AEROBIC BOTTLE ONLY GRAM NEGATIVE RODS CRITICAL VALUE NOTED.  VALUE IS CONSISTENT WITH PREVIOUSLY REPORTED AND CALLED VALUE.    Culture (A)  Final    ESCHERICHIA COLI SUSCEPTIBILITIES PERFORMED ON PREVIOUS CULTURE WITHIN THE LAST 5 DAYS. Performed at [facility], [place] [address], [place], [place] [address]    Report Status 09/13/2019 FINAL  Final  Blood Culture ID Panel (Reflexed)     Status: Abnormal   Collection Time: 09/10/19  8:26 PM  Result Value Ref Range Status  Enterococcus species NOT DETECTED NOT DETECTED Final   Listeria monocytogenes NOT DETECTED NOT DETECTED Final   Staphylococcus species NOT DETECTED NOT DETECTED Final   Staphylococcus aureus (BCID) NOT DETECTED NOT DETECTED Final   Streptococcus species NOT DETECTED NOT DETECTED Final   Streptococcus agalactiae NOT DETECTED NOT DETECTED Final   Streptococcus pneumoniae NOT DETECTED NOT DETECTED Final   Streptococcus pyogenes NOT DETECTED NOT DETECTED Final   Acinetobacter baumannii NOT DETECTED NOT DETECTED Final   Enterobacteriaceae species DETECTED (A) NOT DETECTED Final    Comment: Enterobacteriaceae represent a large family of gram-negative bacteria, not a single organism. CRITICAL RESULT CALLED TO, READ BACK BY AND VERIFIED WITH: L POINDEXTER PHARMD 09/11/19 2021 JDW    Enterobacter cloacae complex NOT DETECTED NOT DETECTED Final   Escherichia coli DETECTED (A) NOT DETECTED Final    Comment: CRITICAL RESULT CALLED TO, READ BACK BY AND VERIFIED WITH: L POINDEXTER PHARMD 09/11/19 2021 JDW    Klebsiella oxytoca NOT DETECTED NOT DETECTED Final   Klebsiella pneumoniae NOT DETECTED NOT DETECTED Final   Proteus species NOT DETECTED NOT DETECTED Final   Serratia marcescens NOT DETECTED NOT DETECTED Final   Carbapenem resistance NOT DETECTED NOT DETECTED Final   Haemophilus influenzae NOT DETECTED NOT DETECTED Final   Neisseria meningitidis NOT DETECTED NOT DETECTED Final   Pseudomonas aeruginosa NOT DETECTED NOT DETECTED Final   Candida albicans NOT DETECTED NOT DETECTED Final   Candida glabrata NOT DETECTED NOT DETECTED Final   Candida krusei NOT DETECTED NOT DETECTED Final   Candida parapsilosis NOT DETECTED NOT DETECTED Final   Candida tropicalis NOT DETECTED NOT  DETECTED Final    Comment: Performed at [facility], [place] [address], [place] [employer] [place], [place] [address]  Urine culture     Status: Abnormal   Collection Time: 09/10/19 10:32 PM   Specimen: Urine, Random  Result Value Ref Range Status   Specimen Description   Final    URINE, RANDOM Performed at [facility] [address] [place], [place] [address]    Special Requests   Final    NONE Performed at [facility], [place] [address] [place], [place] [address]    Culture 80,000 COLONIES/mL ESCHERICHIA COLI (A)  Final   Report Status 09/13/2019 FINAL  Final   Organism ID, Bacteria ESCHERICHIA COLI (A)  Final      Susceptibility   Escherichia coli - MIC*    AMPICILLIN 8 SENSITIVE Sensitive     CEFAZOLIN <=4 SENSITIVE Sensitive     CEFTRIAXONE <=0.25 SENSITIVE Sensitive     CIPROFLOXACIN <=0.25 SENSITIVE Sensitive     GENTAMICIN <=1 SENSITIVE Sensitive     IMIPENEM <=0.25 SENSITIVE Sensitive     NITROFURANTOIN <=16 SENSITIVE Sensitive     TRIMETH/SULFA <=20 SENSITIVE Sensitive     AMPICILLIN/SULBACTAM <=2 SENSITIVE Sensitive     PIP/TAZO <=4 SENSITIVE Sensitive     * 80,000 COLONIES/mL ESCHERICHIA COLI  Culture, blood (Routine X 2) w Reflex to ID Panel     Status: None (Preliminary result)   Collection Time: 09/12/19  6:43 AM   Specimen: BLOOD RIGHT HAND  Result Value Ref Range Status   Specimen Description BLOOD RIGHT HAND  Final   Special Requests   Final    BOTTLES DRAWN AEROBIC AND ANAEROBIC Blood Culture adequate volume Performed at [facility] [address] [place], [place] [address]    Culture NO GROWTH < 12 HOURS  Final   Report Status PENDING  Incomplete  Culture, blood (Routine X 2) w Reflex to ID Panel     Status: None (Preliminary result)   Collection Time: 09/12/19  6:43 AM   Specimen: BLOOD  Result Value Ref Range Status   Specimen Description BLOOD RIGHT ARM  Final   Special Requests   Final    BOTTLES  DRAWN AEROBIC ONLY Blood Culture adequate volume Performed at [facility] [address], [place], [place] [address]    Culture NO GROWTH < 12 HOURS  Final   Report Status PENDING  Incomplete  Culture, respiratory     Status: None (Preliminary result)   Collection Time: 09/12/19 10:25 AM   Specimen: Bronchoalveolar Lavage; Respiratory  Result Value Ref Range Status   Specimen Description   Final    BRONCHIAL ALVEOLAR LAVAGE RLL Performed at [facility] [address], [place], [place] [address]    Special Requests   Final    NONE Performed at [facility], [place] [address] [place], [place] [address]    Gram Stain   Final    RARE WBC PRESENT, PREDOMINANTLY MONONUCLEAR NO SQUAMOUS EPITHELIAL CELLS PRESENT NO ORGANISMS SEEN    Culture   Final    CULTURE REINCUBATED FOR BETTER GROWTH Performed at [facility], [place] [address] [place], [place] [address]    Report Status PENDING  Incomplete  Respiratory Panel by PCR     Status: None   Collection Time: 09/12/19 10:25 AM   Specimen: Bronchial Alveolar Lavage; Respiratory  Result Value Ref Range Status   Adenovirus NOT DETECTED NOT DETECTED Final   Coronavirus 229E NOT DETECTED NOT DETECTED Final    Comment: (NOTE) The Coronavirus on the Respiratory Panel, DOES NOT test for the novel  Coronavirus (2019 nCoV)    Coronavirus HKU1 NOT DETECTED NOT DETECTED Final   Coronavirus NL63 NOT DETECTED NOT DETECTED Final   Coronavirus OC43 NOT DETECTED NOT DETECTED Final   Metapneumovirus NOT DETECTED NOT DETECTED Final   Rhinovirus / Enterovirus NOT DETECTED NOT DETECTED Final   Influenza A NOT DETECTED NOT DETECTED Final   Influenza B NOT DETECTED NOT DETECTED Final   Parainfluenza Virus 1 NOT DETECTED NOT DETECTED Final   Parainfluenza Virus 2 NOT DETECTED NOT DETECTED Final   Parainfluenza Virus 3 NOT DETECTED NOT DETECTED Final   Parainfluenza Virus 4 NOT DETECTED NOT  DETECTED Final   Respiratory Syncytial Virus NOT DETECTED NOT DETECTED Final   Bordetella pertussis NOT DETECTED NOT DETECTED Final   Chlamydophila pneumoniae NOT DETECTED NOT DETECTED Final   Mycoplasma pneumoniae NOT DETECTED NOT DETECTED Final    Comment: Performed at [facility], [place] [address], [place], [place] [address]  Pneumocystis smear by DFA     Status: None   Collection Time: 09/12/19 10:26 AM   Specimen: Bronchoalveolar Lavage; Respiratory  Result Value Ref Range Status   Specimen Source-PJSRC BRONCHIAL ALVEOLAR LAVAGE  Final    Comment: RLL   Pneumocystis jiroveci Ag NEGATIVE  Final    Comment: Performed at [facility] Performed at [facility] [address], [place], [place] [address]          Radiology Studies: CT ANGIO CHEST PE W OR WO CONTRAST  Result Date: 09/11/2019 CLINICAL DATA:  History of pulmonary embolus. Hemoptysis. EXAM: CT ANGIOGRAPHY CHEST WITH CONTRAST TECHNIQUE: Multidetector CT imaging of the chest was performed using the standard protocol during bolus administration of intravenous contrast.  Multiplanar CT image reconstructions and MIPs were obtained to evaluate the vascular anatomy. CONTRAST:  153m OMNIPAQUE IOHEXOL 350 MG/ML SOLN COMPARISON:  August 30, 2019. FINDINGS: Cardiovascular: There remains large filling defects in the main portions of both pulmonary arteries which may be slightly decreased compared to prior exam. Mild cardiomegaly is noted. No pericardial effusion is noted. Atherosclerosis of thoracic aorta is noted without aneurysm or dissection. Mediastinum/Nodes: No enlarged mediastinal, hilar, or axillary lymph nodes. Thyroid gland, trachea, and esophagus demonstrate no significant findings. Lungs/Pleura: No pneumothorax or pleural effusion is noted. Minimal bilateral posterior basilar subsegmental atelectasis is noted. Upper Abdomen: No acute abnormality. Musculoskeletal: No chest wall abnormality.  No acute or significant osseous findings. Review of the MIP images confirms the above findings. IMPRESSION: There remains large bilateral pulmonary emboli in the main portions of the pulmonary arteries which are slightly decreased in size compared to prior exam. Aortic Atherosclerosis (ICD10-I70.0). Electronically Signed   By: JMarijo ConceptionM.D.   On: 09/11/2019 10:34   DG CHEST PORT 1 VIEW  Result Date: 09/13/2019 CLINICAL DATA:  Respiratory failure EXAM: PORTABLE CHEST 1 VIEW COMPARISON:  September 11, 2019 FINDINGS: The heart size and mediastinal contours are within normal limits. Minimal subsegmental atelectasis seen at both lung bases. The visualized skeletal structures are unremarkable. IMPRESSION: Minimal bibasilar subsegmental atelectasis. Electronically Signed   By: BPrudencio PairM.D.   On: 09/13/2019 05:35   ECHOCARDIOGRAM COMPLETE  Result Date: 09/11/2019    ECHOCARDIOGRAM REPORT   Patient Name:   GIZYK MARTYDate of Exam: 09/11/2019 Medical Rec #:  [id number]    Height:       75.0 in Accession #:    [id number]   Weight:       170.4 lb Date of Birth:  7Oct 17, 1952    BSA:          2.050 m Patient Age:    89years      BP:           116/79 mmHg Patient Gender: M             HR:           77 bpm. Exam Location:  Inpatient Procedure: 2D Echo, Color Doppler and Cardiac Doppler Indications:    I50.9* Heart failure (unspecified)  History:        Patient has prior history of Echocardiogram examinations, most                 recent 08/30/2019. CHF. Acute Massive Pulmonary Embolus on                 08/30/19.  Sonographer:    ERaquel SarnaSenior RDCS Referring Phys: [id number] K RAI IMPRESSIONS  1. Left ventricular ejection fraction, by estimation, is 50 to 55%. The left ventricle has low normal function. The left ventricle has no regional wall motion abnormalities. Left ventricular diastolic parameters are consistent with Grade I diastolic dysfunction (impaired relaxation). There is the interventricular septum is  flattened in diastole ('D' shaped left ventricle), consistent with right ventricular volume overload.  2. Right ventricular systolic function is normal. The right ventricular size is moderately enlarged. There is moderately elevated pulmonary artery systolic pressure. The estimated right ventricular systolic pressure is 536.1mmHg.  3. Right atrial size was moderately dilated.  4. The mitral valve is normal in structure and function. Trivial mitral valve regurgitation. No evidence of mitral stenosis.  5. The aortic valve is normal in  structure and function. Aortic valve regurgitation is not visualized. No aortic stenosis is present.  6. Aortic dilatation noted. There is mild dilatation of the ascending aorta measuring 37 mm. FINDINGS  Left Ventricle: Left ventricular ejection fraction, by estimation, is 50 to 55%. The left ventricle has low normal function. The left ventricle has no regional wall motion abnormalities. The left ventricular internal cavity size was normal in size. There is no left ventricular hypertrophy. The interventricular septum is flattened in diastole ('D' shaped left ventricle), consistent with right ventricular volume overload. Left ventricular diastolic parameters are consistent with Grade I diastolic dysfunction (impaired relaxation). Right Ventricle: The right ventricular size is moderately enlarged. No increase in right ventricular wall thickness. Right ventricular systolic function is normal. There is moderately elevated pulmonary artery systolic pressure. The tricuspid regurgitant  velocity is 3.48 m/s, and with an assumed right atrial pressure of 8 mmHg, the estimated right ventricular systolic pressure is 52.8 mmHg. Left Atrium: Left atrial size was normal in size. Right Atrium: Right atrial size was moderately dilated. Pericardium: There is no evidence of pericardial effusion. Mitral Valve: The mitral valve is normal in structure and function. Trivial mitral valve regurgitation. No  evidence of mitral valve stenosis. Tricuspid Valve: The tricuspid valve is normal in structure. Tricuspid valve regurgitation is mild. Aortic Valve: The aortic valve is normal in structure and function. Aortic valve regurgitation is not visualized. No aortic stenosis is present. Pulmonic Valve: The pulmonic valve was normal in structure. Pulmonic valve regurgitation is trivial. Aorta: Aortic dilatation noted. There is mild dilatation of the ascending aorta measuring 37 mm. IAS/Shunts: The atrial septum is grossly normal.  LEFT VENTRICLE PLAX 2D LVIDd:         4.95 cm  Diastology LVIDs:         3.20 cm  LV e' lateral:   9.01 cm/s LV PW:         1.00 cm  LV E/e' lateral: 6.1 LV IVS:        1.02 cm  LV e' medial:    6.68 cm/s LVOT diam:     2.70 cm  LV E/e' medial:  8.3 LV SV:         85 LV SV Index:   42 LVOT Area:     5.73 cm  RIGHT VENTRICLE RV S prime:     13.70 cm/s TAPSE (M-mode): 2.2 cm LEFT ATRIUM             Index       RIGHT ATRIUM           Index LA diam:        3.10 cm 1.51 cm/m  RA Area:     26.00 cm LA Vol (A2C):   48.4 ml 23.61 ml/m RA Volume:   90.70 ml  44.24 ml/m LA Vol (A4C):   68.4 ml 33.36 ml/m LA Biplane Vol: 60.7 ml 29.61 ml/m  AORTIC VALVE LVOT Vmax:   96.60 cm/s LVOT Vmean:  61.900 cm/s LVOT VTI:    0.149 m                          PULMONARY ARTERY AORTA                    MPA diam:        3.20 cm Ao Root diam: 3.90 cm Ao Asc diam:  3.70 cm MITRAL VALVE  TRICUSPID VALVE MV Area (PHT): 2.79 cm    TR Peak grad:   48.4 mmHg MV Decel Time: 272 msec    TR Vmax:        348.00 cm/s MV E velocity: 55.30 cm/s MV A velocity: 78.40 cm/s  SHUNTS MV E/A ratio:  0.71        Systemic VTI:  0.15 m                            Systemic Diam: 2.70 cm Mertie Moores MD Electronically signed by Mertie Moores MD Signature Date/Time: 09/11/2019/3:55:30 PM    Final         Scheduled Meds: . Chlorhexidine Gluconate Cloth  6 each Topical Daily  . docusate sodium  200 mg Oral QHS  . dorzolamide   1 drop Both Eyes BID  . latanoprost  1 drop Both Eyes Daily  . metoprolol tartrate  12.5 mg Oral BID  . pantoprazole (PROTONIX) IV  40 mg Intravenous Q12H  . polyethylene glycol  17 g Oral Daily  . potassium chloride  40 mEq Oral Q4H  . rivaroxaban  15 mg Oral Daily  . tamsulosin  0.4 mg Oral QHS  . timolol  1 drop Both Eyes BID   Continuous Infusions: . cefTRIAXone (ROCEPHIN)  IV       LOS: 3 days     Cordelia Poche, MD [facility] 09/13/2019, 10:08 AM  If 7PM-7AM, please contact night-coverage [email or web address]

## 2019-09-13 NOTE — Consult Note (Addendum)
Joe Cantu   DOB:1951/03/11   IW#:[id number]   ERD#:[id number]  Subjective: Joe Cantu is a 69 year old man with PMH of PVCs, aortic root dilatation, and recent unprovoked bilateral PE, left lower extremity DVT s/p catheter directed TPA on 2/17 and IVC filter placement on 2/18 requiring hospitalization from 2/17 through 09/02/2019.  He had been on outpatient Rivaroxaban BID, and however was admitted with hemoptysis on 09/10/2019.  He underwent a bronchoscopy on 09/12/2019 that showed no evidence of significant ongoing bleed.  Of note he did have positive urine and blood culture on 09/10/2019 for which he is receiving IV antibiotics.    Joe Cantu notes he is feeling better.  He is encouraged that his bronchoscopy yesterday showed no active bleeding.  He reports no easy bruising or bleeding.  He says that he is weak, and would like to get out of bed with PT to work on preparing for discharge.  He denies pain, and he notes it has been a few days since his last bowel movement.    Joe Cantu denies any other issues today and a detailed ROS was otherwise non contributory.  Objective:  Vitals:   09/13/19 0400 09/13/19 0600  BP: 121/82 (!) 115/92  Pulse: 81 76  Resp: (!) 24 (!) 28  Temp:    SpO2: 100% 100%    Body mass index is 21.3 kg/m.  Intake/Output Summary (Last 24 hours) at 09/13/2019 0821 Last data filed at 09/13/2019 5631 Gross per 24 hour  Intake 128.45 ml  Output 2150 ml  Net -2021.55 ml     Sclerae unicteric  Oropharynx shows no thrush or other lesions  No cervical or supraclavicular adenopathy  Lungs no rales or wheezes--auscultated anterolaterally  Heart regular rate and rhythm  Abdomen soft, +BS  Neuro nonfocal    CBG (last 3)  No results for input(s): GLUCAP in the last 72 hours.   Labs:  Lab Results  Component Value Date   WBC 12.2 (H) 09/13/2019   HGB 9.8 (L) 09/13/2019   HCT 31.2 (L) 09/13/2019   MCV 95.4 09/13/2019   PLT 308 09/13/2019   NEUTROABS 12.3 (H) 09/10/2019     _0 @  Urine Studies No results for input(s): UHGB, CRYS in the last 72 hours.  Invalid input(s): UACOL, UAPR, USPG, UPH, UTP, UGL, UKET, UBIL, UNIT, UROB, ULEU, UEPI, UWBC, URBC, UBAC, CAST, [place], [place]  Basic Metabolic Panel: Recent Labs  Lab 09/10/19 1542 09/10/19 1542 09/10/19 2026 09/11/19 0317 09/11/19 0317 09/12/19 0148 09/13/19 0255  NA 132*  --   --  139  --  138 135  K 3.3*   < >  --  4.7   < > 3.8 3.4*  CL 101  --   --  105  --  107 107  CO2 23  --   --  24  --  21* 19*  GLUCOSE 124*  --   --  110*  --  113* 124*  BUN 17  --   --  15  --  15 14  CREATININE 0.92  --   --  0.88  --  0.83 0.76  CALCIUM 8.8*  --   --  8.9  --  9.3 8.8*  MG  --   --  1.9  --   --   --   --    < > = values in this interval not displayed.   GFR Estimated Creatinine Clearance: 99.3 mL/min (by C-G formula based on SCr of 0.76 mg/dL).  Liver Function Tests: Recent Labs  Lab 09/10/19 1542 09/11/19 0317 09/12/19 0643  AST 134* 70* 41  ALT 110* 89* 69*  ALKPHOS 211* 173* 152*  BILITOT 1.3* 1.3* 1.8*  PROT 6.5 6.0* 5.8*  ALBUMIN 2.3* 2.2* 2.0*   Recent Labs  Lab 09/10/19 1542  LIPASE 31   No results for input(s): AMMONIA in the last 168 hours. Coagulation profile Recent Labs  Lab 09/10/19 1542 09/12/19 0643  INR 2.7* 1.3*    CBC: Recent Labs  Lab 09/10/19 1542 09/11/19 0317 09/12/19 0148 09/13/19 0255  WBC 14.2* 16.7* 13.3* 12.2*  NEUTROABS 12.3*  --   --   --   HGB 10.4* 11.0* 10.4* 9.8*  HCT 31.8* 32.8* 32.4* 31.2*  MCV 93.8 92.7 95.9 95.4  PLT 312 328 325 308   Cardiac Enzymes: No results for input(s): CKTOTAL, CKMB, CKMBINDEX, TROPONINI in the last 168 hours. BNP: Invalid input(s): POCBNP CBG: No results for input(s): GLUCAP in the last 168 hours. D-Dimer No results for input(s): DDIMER in the last 72 hours. Hgb A1c No results for input(s): HGBA1C in the last 72 hours. Lipid Profile No results for input(s): CHOL, HDL, LDLCALC, TRIG,  CHOLHDL, LDLDIRECT in the last 72 hours. Thyroid function studies No results for input(s): TSH, T4TOTAL, T3FREE, THYROIDAB in the last 72 hours.  Invalid input(s): FREET3 Anemia work up No results for input(s): VITAMINB12, FOLATE, FERRITIN, TIBC, IRON, RETICCTPCT in the last 72 hours. Microbiology Recent Results (from the past 240 hour(s))  SARS CORONAVIRUS 2 (TAT 6-24 HRS) Nasopharyngeal Nasopharyngeal Swab     Status: None   Collection Time: 09/10/19  5:00 PM   Specimen: Nasopharyngeal Swab  Result Value Ref Range Status   SARS Coronavirus 2 NEGATIVE NEGATIVE Final    Comment: (NOTE) SARS-CoV-2 target nucleic acids are NOT DETECTED. The SARS-CoV-2 RNA is generally detectable in upper and lower respiratory specimens during the acute phase of infection. Negative results do not preclude SARS-CoV-2 infection, do not rule out co-infections with other pathogens, and should not be used as the sole basis for treatment or other patient management decisions. Negative results must be combined with clinical observations, patient history, and epidemiological information. The expected result is Negative. Fact Sheet for Patients: SugarRoll.be Fact Sheet for Healthcare Providers: https://www.woods-mathews.com/ This test is not yet approved or cleared by the [place] FDA and  has been authorized for detection and/or diagnosis of SARS-CoV-2 by FDA under an Emergency Use Authorization (EUA). This EUA will remain  in effect (meaning this test can be used) for the duration of the COVID-19 declaration under Section 56 4(b)(1) of the Act, 21 U.S.C. section 360bbb-3(b)(1), unless the authorization is terminated or revoked sooner. Performed at [facility], [place] [address], [place], [place] [address]   MRSA PCR Screening     Status: None   Collection Time: 09/10/19  8:05 PM   Specimen: Nasal Mucosa; Nasopharyngeal  Result Value Ref Range Status    MRSA by PCR NEGATIVE NEGATIVE Final    Comment:        The GeneXpert MRSA Assay (FDA approved for NASAL specimens only), is one component of a comprehensive MRSA colonization surveillance program. It is not intended to diagnose MRSA infection nor to guide or monitor treatment for MRSA infections. Performed at [facility], [place] [address], [place], [place] [address]   Culture, blood (Routine X 2) w Reflex to ID Panel     Status: Abnormal   Collection Time: 09/10/19  8:26  PM   Specimen: BLOOD  Result Value Ref Range Status   Specimen Description   Final    BLOOD RIGHT ANTECUBITAL Performed at [facility] [address] [place], [place] [address]    Special Requests   Final    BOTTLES DRAWN AEROBIC ONLY Blood Culture adequate volume Performed at [facility] [address], [place], [place] [address]    Culture  Setup Time   Final    AEROBIC BOTTLE ONLY GRAM NEGATIVE RODS CRITICAL RESULT CALLED TO, READ BACK BY AND VERIFIED WITH: L POINDEXTER PHARMD 09/11/19 2021 JDW Performed at [facility], [place] [address] [place], [place] [address]    Culture ESCHERICHIA COLI (A)  Final   Report Status 09/13/2019 FINAL  Final   Organism ID, Bacteria ESCHERICHIA COLI  Final      Susceptibility   Escherichia coli - MIC*    AMPICILLIN <=2 SENSITIVE Sensitive     CEFAZOLIN <=4 SENSITIVE Sensitive     CEFEPIME <=0.12 SENSITIVE Sensitive     CEFTAZIDIME <=1 SENSITIVE Sensitive     CEFTRIAXONE <=0.25 SENSITIVE Sensitive     CIPROFLOXACIN <=0.25 SENSITIVE Sensitive     GENTAMICIN <=1 SENSITIVE Sensitive     IMIPENEM <=0.25 SENSITIVE Sensitive     TRIMETH/SULFA <=20 SENSITIVE Sensitive     AMPICILLIN/SULBACTAM <=2 SENSITIVE Sensitive     PIP/TAZO <=4 SENSITIVE Sensitive     * ESCHERICHIA COLI  Culture, blood (Routine X 2) w Reflex to ID Panel     Status: Abnormal   Collection Time: 09/10/19  8:26 PM   Specimen: BLOOD  Result  Value Ref Range Status   Specimen Description   Final    BLOOD RIGHT PORTA CATH Performed at [facility] [address] [place], [place] [address]    Special Requests   Final    BOTTLES DRAWN AEROBIC ONLY Blood Culture results may not be optimal due to an inadequate volume of blood received in culture bottles Performed at [facility] [address] [place], [place] [address]    Culture  Setup Time   Final    AEROBIC BOTTLE ONLY GRAM NEGATIVE RODS CRITICAL VALUE NOTED.  VALUE IS CONSISTENT WITH PREVIOUSLY REPORTED AND CALLED VALUE.    Culture (A)  Final    ESCHERICHIA COLI SUSCEPTIBILITIES PERFORMED ON PREVIOUS CULTURE WITHIN THE LAST 5 DAYS. Performed at [facility], [place] [address] [place], [place] [address]    Report Status 09/13/2019 FINAL  Final  Blood Culture ID Panel (Reflexed)     Status: Abnormal   Collection Time: 09/10/19  8:26 PM  Result Value Ref Range Status   Enterococcus species NOT DETECTED NOT DETECTED Final   Listeria monocytogenes NOT DETECTED NOT DETECTED Final   Staphylococcus species NOT DETECTED NOT DETECTED Final   Staphylococcus aureus (BCID) NOT DETECTED NOT DETECTED Final   Streptococcus species NOT DETECTED NOT DETECTED Final   Streptococcus agalactiae NOT DETECTED NOT DETECTED Final   Streptococcus pneumoniae NOT DETECTED NOT DETECTED Final   Streptococcus pyogenes NOT DETECTED NOT DETECTED Final   Acinetobacter baumannii NOT DETECTED NOT DETECTED Final   Enterobacteriaceae species DETECTED (A) NOT DETECTED Final    Comment: Enterobacteriaceae represent a large family of gram-negative bacteria, not a single organism. CRITICAL RESULT CALLED TO, READ BACK BY AND VERIFIED WITH: L POINDEXTER PHARMD 09/11/19 2021 JDW    Enterobacter cloacae complex NOT DETECTED NOT DETECTED Final   Escherichia coli DETECTED (A)  NOT DETECTED Final    Comment: CRITICAL RESULT CALLED TO, READ BACK BY AND VERIFIED WITH: L  POINDEXTER PHARMD 09/11/19 2021 JDW    Klebsiella oxytoca NOT DETECTED NOT DETECTED Final   Klebsiella pneumoniae NOT DETECTED NOT DETECTED Final   Proteus species NOT DETECTED NOT DETECTED Final   Serratia marcescens NOT DETECTED NOT DETECTED Final   Carbapenem resistance NOT DETECTED NOT DETECTED Final   Haemophilus influenzae NOT DETECTED NOT DETECTED Final   Neisseria meningitidis NOT DETECTED NOT DETECTED Final   Pseudomonas aeruginosa NOT DETECTED NOT DETECTED Final   Candida albicans NOT DETECTED NOT DETECTED Final   Candida glabrata NOT DETECTED NOT DETECTED Final   Candida krusei NOT DETECTED NOT DETECTED Final   Candida parapsilosis NOT DETECTED NOT DETECTED Final   Candida tropicalis NOT DETECTED NOT DETECTED Final    Comment: Performed at [facility], [place] [address] [place], [place] [address]  Urine culture     Status: Abnormal   Collection Time: 09/10/19 10:32 PM   Specimen: Urine, Random  Result Value Ref Range Status   Specimen Description   Final    URINE, RANDOM Performed at [facility] [address] [place], [place] [address]    Special Requests   Final    NONE Performed at [facility], [place] [address] [place], [place] [address]    Culture 80,000 COLONIES/mL ESCHERICHIA COLI (A)  Final   Report Status 09/13/2019 FINAL  Final   Organism ID, Bacteria ESCHERICHIA COLI (A)  Final      Susceptibility   Escherichia coli - MIC*    AMPICILLIN 8 SENSITIVE Sensitive     CEFAZOLIN <=4 SENSITIVE Sensitive     CEFTRIAXONE <=0.25 SENSITIVE Sensitive     CIPROFLOXACIN <=0.25 SENSITIVE Sensitive     GENTAMICIN <=1 SENSITIVE Sensitive     IMIPENEM <=0.25 SENSITIVE Sensitive     NITROFURANTOIN <=16 SENSITIVE Sensitive     TRIMETH/SULFA <=20 SENSITIVE Sensitive     AMPICILLIN/SULBACTAM <=2 SENSITIVE Sensitive     PIP/TAZO <=4 SENSITIVE Sensitive     * 80,000 COLONIES/mL ESCHERICHIA COLI  Culture, blood (Routine X 2) w Reflex  to ID Panel     Status: None (Preliminary result)   Collection Time: 09/12/19  6:43 AM   Specimen: BLOOD RIGHT HAND  Result Value Ref Range Status   Specimen Description BLOOD RIGHT HAND  Final   Special Requests   Final    BOTTLES DRAWN AEROBIC AND ANAEROBIC Blood Culture adequate volume Performed at [facility] [address], [place], [place] [address]    Culture NO GROWTH < 12 HOURS  Final   Report Status PENDING  Incomplete  Culture, blood (Routine X 2) w Reflex to ID Panel     Status: None (Preliminary result)   Collection Time: 09/12/19  6:43 AM   Specimen: BLOOD  Result Value Ref Range Status   Specimen Description BLOOD RIGHT ARM  Final   Special Requests   Final    BOTTLES DRAWN AEROBIC ONLY Blood Culture adequate volume Performed at [facility] [address], [place], [place] [address]    Culture NO GROWTH < 12 HOURS  Final   Report Status PENDING  Incomplete  Culture, respiratory     Status: None (Preliminary result)   Collection Time: 09/12/19 10:25 AM   Specimen: Bronchoalveolar Lavage; Respiratory  Result Value Ref Range Status   Specimen Description   Final    BRONCHIAL  ALVEOLAR LAVAGE RLL Performed at [facility], [place] [address] [place], [place] [address]    Special Requests   Final    NONE Performed at [facility], [place] [address], [place], [place] [address]    Gram Stain   Final    RARE WBC PRESENT, PREDOMINANTLY MONONUCLEAR NO SQUAMOUS EPITHELIAL CELLS PRESENT NO ORGANISMS SEEN Performed at [facility], [place] [address], [place], [place] [address]    Culture PENDING  Incomplete   Report Status PENDING  Incomplete  Respiratory Panel by PCR     Status: None   Collection Time: 09/12/19 10:25 AM   Specimen: Bronchial Alveolar Lavage; Respiratory  Result Value Ref Range Status   Adenovirus NOT DETECTED NOT DETECTED Final   Coronavirus 229E NOT DETECTED NOT DETECTED  Final    Comment: (NOTE) The Coronavirus on the Respiratory Panel, DOES NOT test for the novel  Coronavirus (2019 nCoV)    Coronavirus HKU1 NOT DETECTED NOT DETECTED Final   Coronavirus NL63 NOT DETECTED NOT DETECTED Final   Coronavirus OC43 NOT DETECTED NOT DETECTED Final   Metapneumovirus NOT DETECTED NOT DETECTED Final   Rhinovirus / Enterovirus NOT DETECTED NOT DETECTED Final   Influenza A NOT DETECTED NOT DETECTED Final   Influenza B NOT DETECTED NOT DETECTED Final   Parainfluenza Virus 1 NOT DETECTED NOT DETECTED Final   Parainfluenza Virus 2 NOT DETECTED NOT DETECTED Final   Parainfluenza Virus 3 NOT DETECTED NOT DETECTED Final   Parainfluenza Virus 4 NOT DETECTED NOT DETECTED Final   Respiratory Syncytial Virus NOT DETECTED NOT DETECTED Final   Bordetella pertussis NOT DETECTED NOT DETECTED Final   Chlamydophila pneumoniae NOT DETECTED NOT DETECTED Final   Mycoplasma pneumoniae NOT DETECTED NOT DETECTED Final    Comment: Performed at [facility], [place] [address], [place], [place] [address]  Pneumocystis smear by DFA     Status: None   Collection Time: 09/12/19 10:26 AM   Specimen: Bronchoalveolar Lavage; Respiratory  Result Value Ref Range Status   Specimen Source-PJSRC BRONCHIAL ALVEOLAR LAVAGE  Final    Comment: RLL   Pneumocystis jiroveci Ag NEGATIVE  Final    Comment: Performed at [facility] Performed at [facility] [address] [place],  [address]       Studies:  CT ANGIO CHEST PE W OR WO CONTRAST  Result Date: 09/11/2019 CLINICAL DATA:  History of pulmonary embolus. Hemoptysis. EXAM: CT ANGIOGRAPHY CHEST WITH CONTRAST TECHNIQUE: Multidetector CT imaging of the chest was performed using the standard protocol during bolus administration of intravenous contrast. Multiplanar CT image reconstructions and MIPs were obtained to evaluate the vascular anatomy. CONTRAST:  121m OMNIPAQUE IOHEXOL 350 MG/ML SOLN COMPARISON:   August 30, 2019. FINDINGS: Cardiovascular: There remains large filling defects in the main portions of both pulmonary arteries which may be slightly decreased compared to prior exam. Mild cardiomegaly is noted. No pericardial effusion is noted. Atherosclerosis of thoracic aorta is noted without aneurysm or dissection. Mediastinum/Nodes: No enlarged mediastinal, hilar, or axillary lymph nodes. Thyroid gland, trachea, and esophagus demonstrate no significant findings. Lungs/Pleura: No pneumothorax or pleural effusion is noted. Minimal bilateral posterior basilar subsegmental atelectasis is noted. Upper Abdomen: No acute abnormality. Musculoskeletal: No chest wall abnormality. No acute or significant osseous findings. Review of the MIP images confirms the above findings. IMPRESSION: There remains large bilateral pulmonary emboli in the main portions of the pulmonary arteries which are slightly decreased in size  compared to prior exam. Aortic Atherosclerosis (ICD10-I70.0). Electronically Signed   By: Marijo Conception M.D.   On: 09/11/2019 10:34   DG CHEST PORT 1 VIEW  Result Date: 09/13/2019 CLINICAL DATA:  Respiratory failure EXAM: PORTABLE CHEST 1 VIEW COMPARISON:  September 11, 2019 FINDINGS: The heart size and mediastinal contours are within normal limits. Minimal subsegmental atelectasis seen at both lung bases. The visualized skeletal structures are unremarkable. IMPRESSION: Minimal bibasilar subsegmental atelectasis. Electronically Signed   By: Prudencio Pair M.D.   On: 09/13/2019 05:35   ECHOCARDIOGRAM COMPLETE  Result Date: 09/11/2019    ECHOCARDIOGRAM REPORT   Patient Name:   Joe Cantu Date of Exam: 09/11/2019 Medical Rec #:  [id number]     Height:       75.0 in Accession #:    [id number]    Weight:       170.4 lb Date of Birth:  May 08, 1951     BSA:          2.050 m Patient Age:    22 years      BP:           116/79 mmHg Patient Gender: M             HR:           77 bpm. Exam Location:  Inpatient Procedure:  2D Echo, Color Doppler and Cardiac Doppler Indications:    I50.9* Heart failure (unspecified)  History:        Patient has prior history of Echocardiogram examinations, most                 recent 08/30/2019. CHF. Acute Massive Pulmonary Embolus on                 08/30/19.  Sonographer:    Raquel Sarna Senior RDCS Referring Phys: [id number] RIPUDEEP K RAI IMPRESSIONS  1. Left ventricular ejection fraction, by estimation, is 50 to 55%. The left ventricle has low normal function. The left ventricle has no regional wall motion abnormalities. Left ventricular diastolic parameters are consistent with Grade I diastolic dysfunction (impaired relaxation). There is the interventricular septum is flattened in diastole ('D' shaped left ventricle), consistent with right ventricular volume overload.  2. Right ventricular systolic function is normal. The right ventricular size is moderately enlarged. There is moderately elevated pulmonary artery systolic pressure. The estimated right ventricular systolic pressure is 79.3 mmHg.  3. Right atrial size was moderately dilated.  4. The mitral valve is normal in structure and function. Trivial mitral valve regurgitation. No evidence of mitral stenosis.  5. The aortic valve is normal in structure and function. Aortic valve regurgitation is not visualized. No aortic stenosis is present.  6. Aortic dilatation noted. There is mild dilatation of the ascending aorta measuring 37 mm. FINDINGS  Left Ventricle: Left ventricular ejection fraction, by estimation, is 50 to 55%. The left ventricle has low normal function. The left ventricle has no regional wall motion abnormalities. The left ventricular internal cavity size was normal in size. There is no left ventricular hypertrophy. The interventricular septum is flattened in diastole ('D' shaped left ventricle), consistent with right ventricular volume overload. Left ventricular diastolic parameters are consistent with Grade I diastolic dysfunction (impaired  relaxation). Right Ventricle: The right ventricular size is moderately enlarged. No increase in right ventricular wall thickness. Right ventricular systolic function is normal. There is moderately elevated pulmonary artery systolic pressure. The tricuspid regurgitant  velocity is 3.48 m/s, and with an assumed right  atrial pressure of 8 mmHg, the estimated right ventricular systolic pressure is 73.4 mmHg. Left Atrium: Left atrial size was normal in size. Right Atrium: Right atrial size was moderately dilated. Pericardium: There is no evidence of pericardial effusion. Mitral Valve: The mitral valve is normal in structure and function. Trivial mitral valve regurgitation. No evidence of mitral valve stenosis. Tricuspid Valve: The tricuspid valve is normal in structure. Tricuspid valve regurgitation is mild. Aortic Valve: The aortic valve is normal in structure and function. Aortic valve regurgitation is not visualized. No aortic stenosis is present. Pulmonic Valve: The pulmonic valve was normal in structure. Pulmonic valve regurgitation is trivial. Aorta: Aortic dilatation noted. There is mild dilatation of the ascending aorta measuring 37 mm. IAS/Shunts: The atrial septum is grossly normal.  LEFT VENTRICLE PLAX 2D LVIDd:         4.95 cm  Diastology LVIDs:         3.20 cm  LV e' lateral:   9.01 cm/s LV PW:         1.00 cm  LV E/e' lateral: 6.1 LV IVS:        1.02 cm  LV e' medial:    6.68 cm/s LVOT diam:     2.70 cm  LV E/e' medial:  8.3 LV SV:         85 LV SV Index:   42 LVOT Area:     5.73 cm  RIGHT VENTRICLE RV S prime:     13.70 cm/s TAPSE (M-mode): 2.2 cm LEFT ATRIUM             Index       RIGHT ATRIUM           Index LA diam:        3.10 cm 1.51 cm/m  RA Area:     26.00 cm LA Vol (A2C):   48.4 ml 23.61 ml/m RA Volume:   90.70 ml  44.24 ml/m LA Vol (A4C):   68.4 ml 33.36 ml/m LA Biplane Vol: 60.7 ml 29.61 ml/m  AORTIC VALVE LVOT Vmax:   96.60 cm/s LVOT Vmean:  61.900 cm/s LVOT VTI:    0.149 m                           PULMONARY ARTERY AORTA                    MPA diam:        3.20 cm Ao Root diam: 3.90 cm Ao Asc diam:  3.70 cm MITRAL VALVE               TRICUSPID VALVE MV Area (PHT): 2.79 cm    TR Peak grad:   48.4 mmHg MV Decel Time: 272 msec    TR Vmax:        348.00 cm/s MV E velocity: 55.30 cm/s MV A velocity: 78.40 cm/s  SHUNTS MV E/A ratio:  0.71        Systemic VTI:  0.15 m                            Systemic Diam: 2.70 cm Mertie Moores MD Electronically signed by Mertie Moores MD Signature Date/Time: 09/11/2019/3:55:30 PM    Final     Assessment: 69 y.o. year old [place] man with unprovoked DVT and PE on Rivaroxaban.  1. Unprovoked DVT and PE on 08/30/2019  (a) s/p  catheter directed TPA 2/17 and IVC filter placement 08/31/2019  (b) Rivaroxaban BID at 15 mg  (c) hypercoagulable panel negative  2. Hemoptysis  (a) Echo 09/10/2020, EF 50-55%   (b) Bronchoscopy on 09/12/2019 shows no active bleeding or etiology  (c) Rivaroxaban at 61m daily, to decrease to 145mafter 30 days  3. Positive blood and urine culture: E Coli on 2/28-pan sensitive  (a) Zosyn 2/28-3/3  (b) Ceftriaxone 3/3-  4. Deconditioning  (a) PT evaluation pending   Plan:   GlDeacons doing well today.  He has had no further hemoptysis, and is clinically improving.  He will continue on Rivaroxaban at 1534maily, and when he runs out of this, he will start on Rivaroxaban 78m20mily.  He understands this.  Physical therapy was consulted today by Dr. NettLonny Pruded I encouraged [place] with the therapists when they arrive to his room.    Dr. MagrJana Hakiml see the patient on 09/22/2019 for labs and f/u.  This should be in the process of being scheduled if not scheduled already.  Ok on our end for discharge when medically appropriate.    Thank you for this consultation, and for the excellent care you are providing our mutual patient.    LindWilber Bihari 09/13/19 10:37 AM Medical Oncology and Hematology [facility] [phone]. [phone]. [phone]

## 2019-09-14 LAB — BASIC METABOLIC PANEL
Anion gap: 9 (ref 5–15)
BUN: 12 mg/dL (ref 8–23)
CO2: 19 mmol/L — ABNORMAL LOW (ref 22–32)
Calcium: 9.4 mg/dL (ref 8.9–10.3)
Chloride: 106 mmol/L (ref 98–111)
Creatinine, Ser: 0.72 mg/dL (ref 0.61–1.24)
GFR calc Af Amer: >60 mL/min (ref >60)
GFR calc non Af Amer: >60 mL/min (ref >60)
Glucose, Bld: 101 mg/dL — ABNORMAL HIGH (ref 70–99)
Potassium: 3.9 mmol/L (ref 3.5–5.1)
Sodium: 134 mmol/L — ABNORMAL LOW (ref 135–145)

## 2019-09-14 LAB — CULTURE, RESPIRATORY W GRAM STAIN: Culture: NORMAL

## 2019-09-14 LAB — CBC
HCT: 32.4 % — ABNORMAL LOW (ref 39.0–52.0)
Hemoglobin: 10.6 g/dL — ABNORMAL LOW (ref 13.0–17.0)
MCH: 31 pg (ref 26.0–34.0)
MCHC: 32.7 g/dL (ref 30.0–36.0)
MCV: 94.7 fL (ref 80.0–100.0)
Platelets: 358 10*3/uL (ref 150–400)
RBC: 3.42 MIL/uL — ABNORMAL LOW (ref 4.22–5.81)
RDW: 13.5 % (ref 11.5–15.5)
WBC: 7.3 10*3/uL (ref 4.0–10.5)
nRBC: 0 % (ref 0.0–0.2)

## 2019-09-14 MED ORDER — PANTOPRAZOLE SODIUM 40 MG PO TBEC
40.0000 mg | DELAYED_RELEASE_TABLET | Freq: Two times a day (BID) | ORAL | Status: DC
  Administered 2019-09-14 – 2019-09-18 (×8): 40 mg via ORAL
  Filled 2019-09-14 (×8): qty 1

## 2019-09-14 NOTE — Progress Notes (Signed)
NAME:  ARBER WIEMERS, MRN:  [id number], DOB:  September 14, 1950, LOS: 4 ADMISSION DATE:  09/10/2019, CONSULTATION DATE:  09/09/2018 REFERRING MD:  Tyler Pita MD, CHIEF COMPLAINT:  PE, Hemoptysis  Brief History   Consult for hemoptysis Hospitalized after syncopal episode and was found to have massive bilateral PE with right heart strain. Received catheter directed TPA on 2/17 and IVC filter was placed on 2/18. Patient was initially placed on heparin drip, subsequently transitioned to Xarelto and f/u with NP Volanda Napoleon  2/26 continued doe s 02 need but no cp, hemoptysis then acute onset hemoptysis p eating lunch ? Gagged?  on day of admit x ? 150cc of dark blood to brought to ER where no drop in hgb and no acute change on cxr and PCCM service asked to consult.  Note w/u also c/w ?liver dz with INR elevation.  Note pt denies any increase sob with onset of hemoptysis, also no cp, no sob over baseline, no assoc epistaxis. Has not had any recurrent bleeding since arrival with mostly dry cough.   Past Medical History    has a past medical history of Aortic root dilatation ([place]) (02/21/2019), Dyspnea on exertion, Frequent unifocal PVCs (02/21/2019), Glaucoma, Gonorrhea, Left inguinal hernia, Pneumonia, Ventricular premature beats, and Wears dentures.  Significant Hospital Events   2/28 Admit  Consults:  PCCM, hematology  Procedures:    Significant Diagnostic Tests:  Right upper quadrant ultrasound 09/10/2019- Normal study  CTA 09/11/2019-PEs which are slightly decreased in size.  Mild bibasal atelectasis/consolidation.  Echocardiogram 09/11/2019-EF 36-64%, grade 1 diastolic dysfunction edema overload, moderate pulmonary hypertension, RVSP 56.4. (RVSP was 76 on 2/17)  Micro Data:  Blood culture 2/28- E. coli  Antimicrobials:  Zosyn 2/28 >>  Interim history/subjective:  Had another couple episodes of "hemoptysis" after eating. I asked if it was mostly blood or mostly sputum, he says about an equal mix of  each. Remains stable on RA.  Objective   Blood pressure (!) 121/91, pulse 85, temperature (!) 97.4 F (36.3 C), temperature source Oral, resp. rate 16, height 6\' 3"  (1.905 m), weight 77.3 kg, SpO2 95 %.        Intake/Output Summary (Last 24 hours) at 09/14/2019 0958 Last data filed at 09/14/2019 0715 Gross per 24 hour  Intake 1210 ml  Output 3000 ml  Net -1790 ml   Filed Weights   09/10/19 1537 09/10/19 2054 09/12/19 0846  Weight: 77.1 kg 77.3 kg 77.3 kg    Examination: Today's Vitals   09/13/19 1815 09/13/19 2053 09/13/19 2100 09/14/19 0512  BP:  115/83  (!) 121/91  Pulse:  83  85  Resp:  18  16  Temp:  97.7 F (36.5 C)  (!) 97.4 F (36.3 C)  TempSrc:  Oral  Oral  SpO2:  97%  95%  Weight:      Height:      PainSc: 0-No pain  0-No pain    Body mass index is 21.3 kg/m. GEN: thin man in NAD HEENT: MM dry, trachea midline CV: RRR, ext warm PULM: Clear, no accessory muscle use GI: Soft, +BS EXT: No edema NEURO: Moves all 4 ext to command PSYCH: RASS 0, fair insight SKIN: No rashes   Resolved Hospital Problem list     Assessment & Plan:  Hemoptysis in the setting of anticoagulation - Imaging, history, and bronch less c/w pulmonary source - Post prandial timing would consider an EGD, another bronchoscopy is less likely to be helpful - I asked him to  save his sputum so I can see if frank blood or more tinged  Recent submassive PE s/p cdTPA 2/17, IVC filter, factor 5 leiden - Needs filter out sometime in next 6 mo - Reduced dose xarelto per previous discussions, may need to hold again for EGD - Will need a repeat echo in about 3-4 months - [facility] AC recommended  E Coli bacteremia- source unclear at this time - Would check UA  - Needs colonoscopy at some point - 7 days coverage reasonable  Will follow with you  Myrla Halsted MD PCCM

## 2019-09-14 NOTE — Consult Note (Signed)
Reason for Consult: ? Hematemesis Referring Physician: [facility]  R[facility] RESS HPI: This is a 69 year old male recently hospitalized for a massive PE 08/2019, left lower extremity DVT, s/p IVC filter 08/23/2019, PVCs, and aortic root dilatation admitted for complaints of hemoptysis. The patient reported hemoptysis and 300-600 ml of blood was noted by EMS.  The patient was placed on Xarelto for his recent PE/DVT.  A bronchoscopy was performed on 09/12/2019 and there was evidence of old blood tracking down from the trachea down to the bilateral lower lobes.  There was no evidence of any acute bleeding.  His HGB was stable and he was restarted back on Xarelto.  Since restarting his Xarelto he had one other episode of hemoptysis.  This was in close proximity with PO intake.  Past Medical History:  Diagnosis Date  . Aortic root dilatation ([place]) 02/21/2019  . Dyspnea on exertion   . Frequent unifocal PVCs 02/21/2019  . Glaucoma   . Gonorrhea    71 & 33 yrs of age; negative for syphillis, herpes and HIV  . Left inguinal hernia   . Pneumonia   . Ventricular premature beats   . Wears dentures     Past Surgical History:  Procedure Laterality Date  . BRONCHIAL WASHINGS  09/12/2019   Procedure: BRONCHIAL WASHINGS;  Surgeon: Marshell Garfinkel, MD;  Location: [facility];  Service: Cardiopulmonary;;  . COLONOSCOPY  07/2018   Benign polyps   . HIATAL HERNIA REPAIR    . IR ANGIOGRAM PULMONARY BILATERAL SELECTIVE  08/30/2019  . IR INFUSION THROMBOL ARTERIAL INITIAL (MS)  08/30/2019  . IR INFUSION THROMBOL ARTERIAL INITIAL (MS)  08/30/2019  . IR IVC FILTER PLMT / S&I /IMG GUID/MOD SED  08/31/2019  . IR THROMB F/U EVAL ART/VEN FINAL DAY (MS)  08/31/2019  . IR US GUIDE VASC ACCESS LEFT  08/30/2019  . IR US GUIDE VASC ACCESS LEFT  08/30/2019  . VIDEO BRONCHOSCOPY N/A 09/12/2019   Procedure: VIDEO BRONCHOSCOPY WITHOUT FLUORO;  Surgeon: Marshell Garfinkel, MD;  Location: [facility];  Service: Cardiopulmonary;   Laterality: N/A;    Family History  Problem Relation Age of Onset  . Diabetes Mother   . Cancer Father        colon  . Diabetes Sister   . Diabetes Brother     Social History:  reports that he quit smoking about 51 years ago. His smoking use included cigarettes. He has a 5.00 pack-year smoking history. He has never used smokeless tobacco. He reports current alcohol use. He reports that he does not use drugs.  Allergies: No Known Allergies  Medications:  Scheduled: . Chlorhexidine Gluconate Cloth  6 each Topical Daily  . docusate sodium  200 mg Oral QHS  . dorzolamide  1 drop Both Eyes BID  . latanoprost  1 drop Both Eyes Daily  . metoprolol tartrate  12.5 mg Oral BID  . pantoprazole  40 mg Oral BID  . polyethylene glycol  17 g Oral Daily  . rivaroxaban  15 mg Oral Daily  . tamsulosin  0.4 mg Oral QHS  . timolol  1 drop Both Eyes BID   Continuous: . cefTRIAXone (ROCEPHIN)  IV 2 g (09/14/19 1303)    Results for orders placed or performed during the hospital encounter of 09/10/19 (from the past 24 hour(s))  CBC     Status: Abnormal   Collection Time: 09/14/19  5:14 AM  Result Value Ref Range   WBC 7.3 4.0 - 10.5 K/uL  RBC 3.42 (L) 4.22 - 5.81 MIL/uL   Hemoglobin 10.6 (L) 13.0 - 17.0 g/dL   HCT 23.5 (L) 36.1 - 44.3 %   MCV 94.7 80.0 - 100.0 fL   MCH 31.0 26.0 - 34.0 pg   MCHC 32.7 30.0 - 36.0 g/dL   RDW 15.4 00.8 - 67.6 %   Platelets 358 150 - 400 K/uL   nRBC 0.0 0.0 - 0.2 %  Basic metabolic panel     Status: Abnormal   Collection Time: 09/14/19  5:14 AM  Result Value Ref Range   Sodium 134 (L) 135 - 145 mmol/L   Potassium 3.9 3.5 - 5.1 mmol/L   Chloride 106 98 - 111 mmol/L   CO2 19 (L) 22 - 32 mmol/L   Glucose, Bld 101 (H) 70 - 99 mg/dL   BUN 12 8 - 23 mg/dL   Creatinine, Ser 1.95 0.61 - 1.24 mg/dL   Calcium 9.4 8.9 - 09.3 mg/dL   GFR calc non Af Amer >60 >60 mL/min   GFR calc Af Amer >60 >60 mL/min   Anion gap 9 5 - 15     DG CHEST PORT 1 VIEW  Result  Date: 09/13/2019 CLINICAL DATA:  Respiratory failure EXAM: PORTABLE CHEST 1 VIEW COMPARISON:  September 11, 2019 FINDINGS: The heart size and mediastinal contours are within normal limits. Minimal subsegmental atelectasis seen at both lung bases. The visualized skeletal structures are unremarkable. IMPRESSION: Minimal bibasilar subsegmental atelectasis. Electronically Signed   By: Jonna Clark M.D.   On: 09/13/2019 05:35    ROS:  As stated above in the HPI otherwise negative.  Blood pressure (!) 124/93, pulse (!) 42, temperature (!) 97.5 F (36.4 C), temperature source Oral, resp. rate 15, height 6\' 3"  (1.905 m), weight 77.3 kg, SpO2 100 %.    PE: Gen: NAD, Alert and Oriented HEENT:  [place]/AT, EOMI Neck: Supple, no LAD Lungs: CTA Bilaterally CV: RRR without M/G/R ABM: Soft, NTND, +BS Ext: No C/C/E  Assessment/Plan: 1) ? Hematemesis. 2) Anemia. 3) Recent PE. 4) DVT.   The patient was rather specific that he "coughed" up blood.  There are currently no overt GI symptoms such as dysphagia, nausea, vomiting, or GERD symptoms.  It is not always easy to discern the source of blood.  It is reasonable to perform an upper endoscopy to exclude any GI source.  Plan: 1) EGD tomorrow. Yeira Gulden D 09/14/2019, 4:00 PM

## 2019-09-14 NOTE — H&P (View-Only) (Signed)
Reason for Consult: ? Hematemesis Referring Physician: [facility]  Joe Cantu HPI: This is a 68 year old male recently hospitalized for a massive PE 08/2019, left lower extremity DVT, s/p IVC filter 08/23/2019, PVCs, and aortic root dilatation admitted for complaints of hemoptysis. The patient reported hemoptysis and 300-600 ml of blood was noted by EMS.  The patient was placed on Xarelto for his recent PE/DVT.  A bronchoscopy was performed on 09/12/2019 and there was evidence of old blood tracking down from the trachea down to the bilateral lower lobes.  There was no evidence of any acute bleeding.  His HGB was stable and he was restarted back on Xarelto.  Since restarting his Xarelto he had one other episode of hemoptysis.  This was in close proximity with PO intake.  Past Medical History:  Diagnosis Date  . Aortic root dilatation ([facility]) 02/21/2019  . Dyspnea on exertion   . Frequent unifocal PVCs 02/21/2019  . Glaucoma   . Gonorrhea    18 & 19 yrs of age; negative for syphillis, herpes and HIV  . Left inguinal hernia   . Pneumonia   . Ventricular premature beats   . Wears dentures     Past Surgical History:  Procedure Laterality Date  . BRONCHIAL WASHINGS  09/12/2019   Procedure: BRONCHIAL WASHINGS;  Surgeon: Mannam, Praveen, MD;  Location: [facility];  Service: Cardiopulmonary;;  . COLONOSCOPY  07/2018   Benign polyps   . HIATAL HERNIA REPAIR    . IR ANGIOGRAM PULMONARY BILATERAL SELECTIVE  08/30/2019  . IR INFUSION THROMBOL ARTERIAL INITIAL (MS)  08/30/2019  . IR INFUSION THROMBOL ARTERIAL INITIAL (MS)  08/30/2019  . IR IVC FILTER PLMT / S&I /IMG GUID/MOD SED  08/31/2019  . IR THROMB F/U EVAL ART/VEN FINAL DAY (MS)  08/31/2019  . IR US GUIDE VASC ACCESS LEFT  08/30/2019  . IR US GUIDE VASC ACCESS LEFT  08/30/2019  . VIDEO BRONCHOSCOPY N/A 09/12/2019   Procedure: VIDEO BRONCHOSCOPY WITHOUT FLUORO;  Surgeon: Mannam, Praveen, MD;  Location: [facility];  Service: Cardiopulmonary;   Laterality: N/A;    Family History  Problem Relation Age of Onset  . Diabetes Mother   . Cancer Father        colon  . Diabetes Sister   . Diabetes Brother     Social History:  reports that he quit smoking about 51 years ago. His smoking use included cigarettes. He has a 5.00 pack-year smoking history. He has never used smokeless tobacco. He reports current alcohol use. He reports that he does not use drugs.  Allergies: No Known Allergies  Medications:  Scheduled: . Chlorhexidine Gluconate Cloth  6 each Topical Daily  . docusate sodium  200 mg Oral QHS  . dorzolamide  1 drop Both Eyes BID  . latanoprost  1 drop Both Eyes Daily  . metoprolol tartrate  12.5 mg Oral BID  . pantoprazole  40 mg Oral BID  . polyethylene glycol  17 g Oral Daily  . rivaroxaban  15 mg Oral Daily  . tamsulosin  0.4 mg Oral QHS  . timolol  1 drop Both Eyes BID   Continuous: . cefTRIAXone (ROCEPHIN)  IV 2 g (09/14/19 1303)    Results for orders placed or performed during the hospital encounter of 09/10/19 (from the past 24 hour(s))  CBC     Status: Abnormal   Collection Time: 09/14/19  5:14 AM  Result Value Ref Range   WBC 7.3 4.0 - 10.5 K/uL     RBC 3.42 (L) 4.22 - 5.81 MIL/uL   Hemoglobin 10.6 (L) 13.0 - 17.0 g/dL   HCT 23.5 (L) 36.1 - 44.3 %   MCV 94.7 80.0 - 100.0 fL   MCH 31.0 26.0 - 34.0 pg   MCHC 32.7 30.0 - 36.0 g/dL   RDW 15.4 00.8 - 67.6 %   Platelets 358 150 - 400 K/uL   nRBC 0.0 0.0 - 0.2 %  Basic metabolic panel     Status: Abnormal   Collection Time: 09/14/19  5:14 AM  Result Value Ref Range   Sodium 134 (L) 135 - 145 mmol/L   Potassium 3.9 3.5 - 5.1 mmol/L   Chloride 106 98 - 111 mmol/L   CO2 19 (L) 22 - 32 mmol/L   Glucose, Bld 101 (H) 70 - 99 mg/dL   BUN 12 8 - 23 mg/dL   Creatinine, Ser 1.95 0.61 - 1.24 mg/dL   Calcium 9.4 8.9 - 09.3 mg/dL   GFR calc non Af Amer >60 >60 mL/min   GFR calc Af Amer >60 >60 mL/min   Anion gap 9 5 - 15     DG CHEST PORT 1 VIEW  Result  Date: 09/13/2019 CLINICAL DATA:  Respiratory failure EXAM: PORTABLE CHEST 1 VIEW COMPARISON:  September 11, 2019 FINDINGS: The heart size and mediastinal contours are within normal limits. Minimal subsegmental atelectasis seen at both lung bases. The visualized skeletal structures are unremarkable. IMPRESSION: Minimal bibasilar subsegmental atelectasis. Electronically Signed   By: Jonna Clark M.D.   On: 09/13/2019 05:35    ROS:  As stated above in the HPI otherwise negative.  Blood pressure (!) 124/93, pulse (!) 42, temperature (!) 97.5 F (36.4 C), temperature source Oral, resp. rate 15, height 6\' 3"  (1.905 m), weight 77.3 kg, SpO2 100 %.    PE: Gen: NAD, Alert and Oriented HEENT:  [place]/AT, EOMI Neck: Supple, no LAD Lungs: CTA Bilaterally CV: RRR without M/G/R ABM: Soft, NTND, +BS Ext: No C/C/E  Assessment/Plan: 1) ? Hematemesis. 2) Anemia. 3) Recent PE. 4) DVT.   The patient was rather specific that he "coughed" up blood.  There are currently no overt GI symptoms such as dysphagia, nausea, vomiting, or GERD symptoms.  It is not always easy to discern the source of blood.  It is reasonable to perform an upper endoscopy to exclude any GI source.  Plan: 1) EGD tomorrow. Joe Cantu D 09/14/2019, 4:00 PM

## 2019-09-14 NOTE — Evaluation (Signed)
Physical Therapy Evaluation Patient Details Name: Joe Cantu MRN: [id number] DOB: 05-Dec-1950 Today's Date: 09/14/2019   History of Present Illness  Joe Cantu is a 69 y.o. male with medical history significant of frequent PVCs and aortic root dilation and admitted for syncopal event . CTA of the chest was significant for a massive PE with complete occlusion of the right main artery with absent arterial blood flow to right lung in addition to partially occlusive filling defects of left lower lobe pulmonary arteries; also evidence of significant right heart strain with associated right heart dilation.  Pt admitted for treatment of acute massive PE and left lower extremity deep vein thrombosis with significant right heart dysfunction  Clinical Impression  He was resting in bed when PT arrived. Agreed to participate, but stated he was "just so very weak, and anxious to try and get up as he has been in bed a few days". Of interest, he tended to answer differently to same or similar questions. He indicates he plans to live with fiancee' who "is in good health and drives". He wears glasses and also has glaucoma. Has one outside step to one level home, no railings. Walk in shower with no seat, no grab bars, and standard commode height. He is generally weak with reduced safety awareness and reduced insight into his medical situation. He did apologize saying" I am just never sick so I really do not know what to expect or how to answer". He should benefit from PT to address goals for optimal functional outcomes.    Follow Up Recommendations SNF    Equipment Recommendations  None recommended by PT    Recommendations for Other Services       Precautions / Restrictions Precautions Precautions: Fall Precaution Comments: He presents with reduced safety awareness and insight into current medical situation Restrictions Weight Bearing Restrictions: No      Mobility  Bed Mobility Overal bed mobility:  Needs Assistance Bed Mobility: Supine to Sit;Sit to Supine     Supine to sit: Min guard Sit to supine: Min guard   General bed mobility comments: Presents as generally weak  Transfers Overall transfer level: Needs assistance Equipment used: Rolling walker (2 wheeled) Transfers: Sit to/from Stand Sit to Stand: Min guard;Min assist Stand pivot transfers: Min guard;Min assist          Ambulation/Gait Ambulation/Gait assistance: Min assist;Min guard Gait Distance (Feet): 12 Feet(Fatigued very easily today)   Gait Pattern/deviations: Step-through pattern;Decreased stride length     General Gait Details: mild shortness of breath with minimal exertion.  Stairs            Wheelchair Mobility    Modified Rankin (Stroke Patients Only)       Balance Overall balance assessment: Mild deficits observed, not formally tested Sitting-balance support: Bilateral upper extremity supported Sitting balance-Leahy Scale: Fair     Standing balance support: No upper extremity supported Standing balance-Leahy Scale: Fair Standing balance comment: Fair standing balance, and presented as anxious when sit<>stand and pivoted into BS chair                             Pertinent Vitals/Pain Pain Assessment: No/denies pain    Home Living Family/patient expects to be discharged to:: Private residence Living Arrangements: Other relatives Available Help at Discharge: Family Type of Home: House Home Access: Stairs to enter   Technical brewer of Steps: 3 Home Layout: One level Home Equipment: Kasandra Knudsen -  single point;Walker - 2 wheels      Prior Function Level of Independence: Independent               Hand Dominance   Dominant Hand: Right    Extremity/Trunk Assessment        Lower Extremity Assessment Lower Extremity Assessment: Generalized weakness    Cervical / Trunk Assessment Cervical / Trunk Assessment: Normal  Communication   Communication: No  difficulties  Cognition Arousal/Alertness: Awake/alert Behavior During Therapy: WFL for tasks assessed/performed Overall Cognitive Status: Within Functional Limits for tasks assessed                                 General Comments: While pleasant, he was inconsistent in answers. States that he will plan to live with fiancee' who is able to drive. He does have glaucoma/wears glasses.      General Comments General comments (skin integrity, edema, etc.): Monitor skin and joint integrity. Poor tone and turgor    Exercises General Exercises - Lower Extremity Ankle Circles/Pumps: AROM;Seated Gluteal Sets: AROM;Seated Short Arc Quad: AROM;Seated Hip ABduction/ADduction: AROM;Seated Hip Flexion/Marching: AROM;Seated Other Exercises Other Exercises: Practiced pursed lip breathing Other Exercises: As mentioned for LE and reminded to perform same in bed and BS chair   Assessment/Plan    PT Assessment Patient needs continued PT services  PT Problem List Decreased strength;Decreased activity tolerance;Decreased mobility       PT Treatment Interventions      PT Goals (Current goals can be found in the Care Plan section)  Acute Rehab PT Goals Patient Stated Goal: wants to get home as soon as feasible PT Goal Formulation: With patient Time For Goal Achievement: 09/28/19 Potential to Achieve Goals: Good    Frequency Min 3X/week   Barriers to discharge        Co-evaluation               AM-PAC PT "6 Clicks" Mobility  Outcome Measure   Help needed moving from lying on your back to sitting on the side of a flat bed without using bedrails?: A Little Help needed moving to and from a bed to a chair (including a wheelchair)?: A Little Help needed standing up from a chair using your arms (e.g., wheelchair or bedside chair)?: A Little Help needed to walk in hospital room?: A Lot Help needed climbing 3-5 steps with a railing? : A Lot 6 Click Score: 13    End of  Session   Activity Tolerance: Patient limited by fatigue Patient left: with call bell/phone within reach;with bed alarm set;in chair Nurse Communication: Mobility status PT Visit Diagnosis: Difficulty in walking, not elsewhere classified (R26.2);Muscle weakness (generalized) (M62.81)    Time: 1100-1144 PT Time Calculation (min) (ACUTE ONLY): 44 min   Charges:   PT Evaluation $PT Eval Moderate Complexity: 1 Mod PT Treatments $Therapeutic Exercise: 8-22 mins $Therapeutic Activity: 8-22 mins        Harriett Rush, PT # [id number] CGV cell  Ephriam Jenkins 09/14/2019, 2:34 PM

## 2019-09-14 NOTE — Progress Notes (Signed)
PROGRESS NOTE    Joe Cantu  GBT:[id number] DOB: 1951-01-24 DOA: 09/10/2019 PCP: Iona Beard, MD   Brief Narrative: Joe Cantu is a 69 y.o. male with a history of frequent PVCs, massive PE, left lower extremity DVT s/p IVC filter, factor v Leiden (heterozygous). Patient presented secondary to hemoptysis in setting of Xarelto use for PE. Bronchoscopy performed which was not significant for acute bleed. Xarelto restarted and patient has recurrent hemoptysis. Hemoglobin appears to be stable at this time.   Assessment & Plan:   Principal Problem:   Hemoptysis Active Problems:   Aortic root dilatation (HCC)   Frequent unifocal PVCs   Acute massive pulmonary embolism (HCC)   DVT (deep venous thrombosis) (HCC)   Hemoptysis In setting of recent initiation of anticoagulation and coughing spells. Xarelto initially held and pulmonology consulted. No recurrent hemoptysis and hemoglobin has been stable. Pulmonology performed a bronchoscopy on 3/2 which was significant for no bleeding source. Oncology was consulted for recommendations on Xarelto dose in setting of acute PE (massive) in addition to extensive LE DVT s/p IVC filter and recommendation to decrease dose to Xarelto 15 mg daily to complete currently available prescription followed by reduction to Xarelto 10 mg. Patient with recurrent ?hemoptysis. Discussed with pulmonology with concern this may not be originating from airway; recommending GI consult. BAL without malignant cells. -Continue Xarelto 15 mg daily -PT eval pending -GI consult -Will obtain a speech consult  History of massive PE History of left LE DVT Factor V Leiden, heterozygous Patient was on Xarelto 15 mg BID prior to admission. IVC filter also placed prior to admission. Patient's Xarelto dose has been adjusted as mentioned above to Xarelto 15 mg daily followed by Xarelto 10 mg daily. Patient has follow up with hematology scheduled.  Acute transaminitis In setting  of massive PE and RV dysfunction. Improved.  Frequent PVCs -Continue metoprolol  Prolonged QTc Noted.  Hypokalemia Resolved.   DVT prophylaxis: Xarelto Code Status:   Code Status: Full Code Family Communication: None at bedside Disposition Plan: Discharge possibly tomorrow pending on GI/pulmonology recommendations in addition to PT recommendations and improvement of hemoptysis on anticoagulation   Consultants:   Pulmonology  Hematology  Procedures:   BRONCHOSCOPY (3/2) Impression:      - Hemoptysis      - The airway examination did not show endbronchial abnormalities or       active bleeding. Blood tinged mucus and old blood noted in the bilateral       airways.      - Bronchoalveolar lavage was performed.  Antimicrobials:  None    Subjective: Coughing up blood this morning. Unsure of the amount. Stable dyspnea.  Objective: Vitals:   09/13/19 1000 09/13/19 1257 09/13/19 2053 09/14/19 0512  BP: 135/88 111/79 115/83 (!) 121/91  Pulse: 83 80 83 85  Resp: _0 Temp:  97.7 F (36.5 C) 97.7 F (36.5 C) (!) 97.4 F (36.3 C)  TempSrc:  Oral Oral Oral  SpO2: 100% 98% 97% 95%  Weight:      Height:        Intake/Output Summary (Last 24 hours) at 09/14/2019 0944 Last data filed at 09/14/2019 0715 Gross per 24 hour  Intake 1570 ml  Output 3250 ml  Net -1680 ml   Filed Weights   09/10/19 1537 09/10/19 2054 09/12/19 0846  Weight: 77.1 kg 77.3 kg 77.3 kg    Examination:  General exam: Appears calm and comfortable Respiratory system: Rales. Respiratory effort  normal. Cardiovascular system: S1 & S2 heard, RRR. No murmurs, rubs, gallops or clicks. Gastrointestinal system: Abdomen is nondistended, soft and nontender. No organomegaly or masses felt. Normal bowel sounds heard. Central nervous system: Alert and oriented. No focal neurological deficits. Extremities: No edema. No calf tenderness Skin: No cyanosis. No rashes Psychiatry: Judgement and insight  appear normal. Mood & affect appropriate.     Data Reviewed: I have personally reviewed following labs and imaging studies  CBC: Recent Labs  Lab 09/10/19 1542 09/10/19 1542 09/11/19 0317 09/12/19 0148 09/13/19 0255 09/13/19 1453 09/14/19 0514  WBC 14.2*  --  16.7* 13.3* 12.2*  --  7.3  NEUTROABS 12.3*  --   --   --   --   --   --   HGB 10.4*   < > 11.0* 10.4* 9.8* 10.7* 10.6*  HCT 31.8*   < > 32.8* 32.4* 31.2* 33.5* 32.4*  MCV 93.8  --  92.7 95.9 95.4  --  94.7  PLT 312  --  328 325 308  --  358   < > = values in this interval not displayed.   Basic Metabolic Panel: Recent Labs  Lab 09/10/19 1542 09/10/19 2026 09/11/19 0317 09/12/19 0148 09/13/19 0255 09/14/19 0514  NA 132*  --  139 138 135 134*  K 3.3*  --  4.7 3.8 3.4* 3.9  CL 101  --  105 107 107 106  CO2 23  --  24 21* 19* 19*  GLUCOSE 124*  --  110* 113* 124* 101*  BUN 17  --  _0 CREATININE 0.92  --  0.88 0.83 0.76 0.72  CALCIUM 8.8*  --  8.9 9.3 8.8* 9.4  MG  --  1.9  --   --   --   --    GFR: Estimated Creatinine Clearance: 99.3 mL/min (by C-G formula based on SCr of 0.72 mg/dL). Liver Function Tests: Recent Labs  Lab 09/10/19 1542 09/11/19 0317 09/12/19 0643  AST 134* 70* 41  ALT 110* 89* 69*  ALKPHOS 211* 173* 152*  BILITOT 1.3* 1.3* 1.8*  PROT 6.5 6.0* 5.8*  ALBUMIN 2.3* 2.2* 2.0*   Recent Labs  Lab 09/10/19 1542  LIPASE 31   No results for input(s): AMMONIA in the last 168 hours. Coagulation Profile: Recent Labs  Lab 09/10/19 1542 09/12/19 0643  INR 2.7* 1.3*   Cardiac Enzymes: No results for input(s): CKTOTAL, CKMB, CKMBINDEX, TROPONINI in the last 168 hours. BNP (last 3 results) No results for input(s): PROBNP in the last 8760 hours. HbA1C: No results for input(s): HGBA1C in the last 72 hours. CBG: No results for input(s): GLUCAP in the last 168 hours. Lipid Profile: No results for input(s): CHOL, HDL, LDLCALC, TRIG, CHOLHDL, LDLDIRECT in the last 72  hours. Thyroid Function Tests: No results for input(s): TSH, T4TOTAL, FREET4, T3FREE, THYROIDAB in the last 72 hours. Anemia Panel: No results for input(s): VITAMINB12, FOLATE, FERRITIN, TIBC, IRON, RETICCTPCT in the last 72 hours. Sepsis Labs: Recent Labs  Lab 09/10/19 2026  PROCALCITON 1.53    Recent Results (from the past 240 hour(s))  SARS CORONAVIRUS 2 (TAT 6-24 HRS) Nasopharyngeal Nasopharyngeal Swab     Status: None   Collection Time: 09/10/19  5:00 PM   Specimen: Nasopharyngeal Swab  Result Value Ref Range Status   SARS Coronavirus 2 NEGATIVE NEGATIVE Final    Comment: (NOTE) SARS-CoV-2 target nucleic acids are NOT DETECTED. The SARS-CoV-2 RNA is generally detectable in upper and lower  respiratory specimens during the acute phase of infection. Negative results do not preclude SARS-CoV-2 infection, do not rule out co-infections with other pathogens, and should not be used as the sole basis for treatment or other patient management decisions. Negative results must be combined with clinical observations, patient history, and epidemiological information. The expected result is Negative. Fact Sheet for Patients: SugarRoll.be Fact Sheet for Healthcare Providers: https://www.woods-mathews.com/ This test is not yet approved or cleared by the [place] FDA and  has been authorized for detection and/or diagnosis of SARS-CoV-2 by FDA under an Emergency Use Authorization (EUA). This EUA will remain  in effect (meaning this test can be used) for the duration of the COVID-19 declaration under Section 56 4(b)(1) of the Act, 21 U.S.C. section 360bbb-3(b)(1), unless the authorization is terminated or revoked sooner. Performed at [facility], [place] [address] [place], [place] [address]   MRSA PCR Screening     Status: None   Collection Time: 09/10/19  8:05 PM   Specimen: Nasal Mucosa; Nasopharyngeal  Result Value Ref Range Status    MRSA by PCR NEGATIVE NEGATIVE Final    Comment:        The GeneXpert MRSA Assay (FDA approved for NASAL specimens only), is one component of a comprehensive MRSA colonization surveillance program. It is not intended to diagnose MRSA infection nor to guide or monitor treatment for MRSA infections. Performed at [facility], [place] [address], [place], [place] [address]   Culture, blood (Routine X 2) w Reflex to ID Panel     Status: Abnormal   Collection Time: 09/10/19  8:26 PM   Specimen: BLOOD  Result Value Ref Range Status   Specimen Description   Final    BLOOD RIGHT ANTECUBITAL Performed at [facility] [address], [place], [place] [address]    Special Requests   Final    BOTTLES DRAWN AEROBIC ONLY Blood Culture adequate volume Performed at [facility] [address], [place], [place] [address]    Culture  Setup Time   Final    AEROBIC BOTTLE ONLY GRAM NEGATIVE RODS CRITICAL RESULT CALLED TO, READ BACK BY AND VERIFIED WITH: L POINDEXTER PHARMD 09/11/19 2021 JDW Performed at [facility], [place] [address], [place], [place] [address]    Culture ESCHERICHIA COLI (A)  Final   Report Status 09/13/2019 FINAL  Final   Organism ID, Bacteria ESCHERICHIA COLI  Final      Susceptibility   Escherichia coli - MIC*    AMPICILLIN <=2 SENSITIVE Sensitive     CEFAZOLIN <=4 SENSITIVE Sensitive     CEFEPIME <=0.12 SENSITIVE Sensitive     CEFTAZIDIME <=1 SENSITIVE Sensitive     CEFTRIAXONE <=0.25 SENSITIVE Sensitive     CIPROFLOXACIN <=0.25 SENSITIVE Sensitive     GENTAMICIN <=1 SENSITIVE Sensitive     IMIPENEM <=0.25 SENSITIVE Sensitive     TRIMETH/SULFA <=20 SENSITIVE Sensitive     AMPICILLIN/SULBACTAM <=2 SENSITIVE Sensitive     PIP/TAZO <=4 SENSITIVE Sensitive     * ESCHERICHIA COLI  Culture, blood (Routine X 2) w Reflex to ID Panel     Status: Abnormal   Collection Time: 09/10/19  8:26 PM   Specimen: BLOOD  Result  Value Ref Range Status   Specimen Description   Final    BLOOD RIGHT PORTA CATH Performed at [facility] [address] [place], [place] [address]    Special Requests   Final  BOTTLES DRAWN AEROBIC ONLY Blood Culture results may not be optimal due to an inadequate volume of blood received in culture bottles Performed at [facility], [place] [address], [place], [place] [address]    Culture  Setup Time   Final    AEROBIC BOTTLE ONLY GRAM NEGATIVE RODS CRITICAL VALUE NOTED.  VALUE IS CONSISTENT WITH PREVIOUSLY REPORTED AND CALLED VALUE.    Culture (A)  Final    ESCHERICHIA COLI SUSCEPTIBILITIES PERFORMED ON PREVIOUS CULTURE WITHIN THE LAST 5 DAYS. Performed at [facility], [place] [address] [place], [place] [address]    Report Status 09/13/2019 FINAL  Final  Blood Culture ID Panel (Reflexed)     Status: Abnormal   Collection Time: 09/10/19  8:26 PM  Result Value Ref Range Status   Enterococcus species NOT DETECTED NOT DETECTED Final   Listeria monocytogenes NOT DETECTED NOT DETECTED Final   Staphylococcus species NOT DETECTED NOT DETECTED Final   Staphylococcus aureus (BCID) NOT DETECTED NOT DETECTED Final   Streptococcus species NOT DETECTED NOT DETECTED Final   Streptococcus agalactiae NOT DETECTED NOT DETECTED Final   Streptococcus pneumoniae NOT DETECTED NOT DETECTED Final   Streptococcus pyogenes NOT DETECTED NOT DETECTED Final   Acinetobacter baumannii NOT DETECTED NOT DETECTED Final   Enterobacteriaceae species DETECTED (A) NOT DETECTED Final    Comment: Enterobacteriaceae represent a large family of gram-negative bacteria, not a single organism. CRITICAL RESULT CALLED TO, READ BACK BY AND VERIFIED WITH: L POINDEXTER PHARMD 09/11/19 2021 JDW    Enterobacter cloacae complex NOT DETECTED NOT DETECTED Final   Escherichia coli DETECTED (A) NOT DETECTED Final    Comment: CRITICAL RESULT CALLED TO, READ BACK BY AND VERIFIED WITH: L  POINDEXTER PHARMD 09/11/19 2021 JDW    Klebsiella oxytoca NOT DETECTED NOT DETECTED Final   Klebsiella pneumoniae NOT DETECTED NOT DETECTED Final   Proteus species NOT DETECTED NOT DETECTED Final   Serratia marcescens NOT DETECTED NOT DETECTED Final   Carbapenem resistance NOT DETECTED NOT DETECTED Final   Haemophilus influenzae NOT DETECTED NOT DETECTED Final   Neisseria meningitidis NOT DETECTED NOT DETECTED Final   Pseudomonas aeruginosa NOT DETECTED NOT DETECTED Final   Candida albicans NOT DETECTED NOT DETECTED Final   Candida glabrata NOT DETECTED NOT DETECTED Final   Candida krusei NOT DETECTED NOT DETECTED Final   Candida parapsilosis NOT DETECTED NOT DETECTED Final   Candida tropicalis NOT DETECTED NOT DETECTED Final    Comment: Performed at [facility], [place] [address], [place], [place] [address]  Urine culture     Status: Abnormal   Collection Time: 09/10/19 10:32 PM   Specimen: Urine, Random  Result Value Ref Range Status   Specimen Description   Final    URINE, RANDOM Performed at [place] [address] [place], [place] [address]    Special Requests   Final    NONE Performed at [facility], [place] [address], [place], [place] [address]    Culture 80,000 COLONIES/mL ESCHERICHIA COLI (A)  Final   Report Status 09/13/2019 FINAL  Final   Organism ID, Bacteria ESCHERICHIA COLI (A)  Final      Susceptibility   Escherichia coli - MIC*    AMPICILLIN 8 SENSITIVE Sensitive     CEFAZOLIN <=4 SENSITIVE Sensitive     CEFTRIAXONE <=0.25 SENSITIVE Sensitive     CIPROFLOXACIN <=0.25 SENSITIVE Sensitive     GENTAMICIN <=1 SENSITIVE Sensitive     IMIPENEM <=0.25 SENSITIVE  Sensitive     NITROFURANTOIN <=16 SENSITIVE Sensitive     TRIMETH/SULFA <=20 SENSITIVE Sensitive     AMPICILLIN/SULBACTAM <=2 SENSITIVE Sensitive     PIP/TAZO <=4 SENSITIVE Sensitive     * 80,000 COLONIES/mL ESCHERICHIA COLI  Culture, blood (Routine X 2) w Reflex  to ID Panel     Status: None (Preliminary result)   Collection Time: 09/12/19  6:43 AM   Specimen: BLOOD RIGHT HAND  Result Value Ref Range Status   Specimen Description   Final    BLOOD RIGHT HAND Performed at [facility] [address] [place], [place] [address]    Special Requests   Final    BOTTLES DRAWN AEROBIC AND ANAEROBIC Blood Culture adequate volume Performed at [facility] [address] [place], [place] [address]    Culture   Final    NO GROWTH 2 DAYS Performed at [facility] [address] [place], [place] [address]    Report Status PENDING  Incomplete  Culture, blood (Routine X 2) w Reflex to ID Panel     Status: None (Preliminary result)   Collection Time: 09/12/19  6:43 AM   Specimen: BLOOD  Result Value Ref Range Status   Specimen Description   Final    BLOOD RIGHT ARM Performed at [facility] [address], [place], [place] [address]    Special Requests   Final    BOTTLES DRAWN AEROBIC ONLY Blood Culture adequate volume Performed at [place] [address], [place], [place] [address]    Culture   Final    NO GROWTH 2 DAYS Performed at [facility] [address], [place], [place] [address]    Report Status PENDING  Incomplete  Culture, respiratory     Status: None   Collection Time: 09/12/19 10:25 AM   Specimen: Bronchoalveolar Lavage; Respiratory  Result Value Ref Range Status   Specimen Description   Final    BRONCHIAL ALVEOLAR LAVAGE RLL Performed at [facility] [address], [place], [place] [address]    Special Requests   Final    NONE Performed at [facility], [place] [address], [place], [place] [address]    Gram Stain   Final    RARE WBC PRESENT, PREDOMINANTLY MONONUCLEAR NO SQUAMOUS EPITHELIAL CELLS PRESENT NO ORGANISMS SEEN    Culture   Final    Consistent with normal respiratory  flora. Performed at [facility], [place] [address], [place], [place] [address]    Report Status 09/14/2019 FINAL  Final  Respiratory Panel by PCR     Status: None   Collection Time: 09/12/19 10:25 AM   Specimen: Bronchial Alveolar Lavage; Respiratory  Result Value Ref Range Status   Adenovirus NOT DETECTED NOT DETECTED Final   Coronavirus 229E NOT DETECTED NOT DETECTED Final    Comment: (NOTE) The Coronavirus on the Respiratory Panel, DOES NOT test for the novel  Coronavirus (2019 nCoV)    Coronavirus HKU1 NOT DETECTED NOT DETECTED Final   Coronavirus NL63 NOT DETECTED NOT DETECTED Final   Coronavirus OC43 NOT DETECTED NOT DETECTED Final   Metapneumovirus NOT DETECTED NOT DETECTED Final   Rhinovirus / Enterovirus NOT DETECTED NOT DETECTED Final   Influenza A NOT DETECTED NOT DETECTED Final   Influenza B NOT DETECTED NOT DETECTED Final   Parainfluenza Virus 1 NOT DETECTED NOT DETECTED Final   Parainfluenza Virus 2 NOT DETECTED NOT DETECTED  Final   Parainfluenza Virus 3 NOT DETECTED NOT DETECTED Final   Parainfluenza Virus 4 NOT DETECTED NOT DETECTED Final   Respiratory Syncytial Virus NOT DETECTED NOT DETECTED Final   Bordetella pertussis NOT DETECTED NOT DETECTED Final   Chlamydophila pneumoniae NOT DETECTED NOT DETECTED Final   Mycoplasma pneumoniae NOT DETECTED NOT DETECTED Final    Comment: Performed at [facility], [place] [address], [place], [place] [address]  Pneumocystis smear by DFA     Status: None   Collection Time: 09/12/19 10:26 AM   Specimen: Bronchoalveolar Lavage; Respiratory  Result Value Ref Range Status   Specimen Source-[facility] BRONCHIAL ALVEOLAR LAVAGE  Final    Comment: RLL   Pneumocystis jiroveci Ag NEGATIVE  Final    Comment: Performed at [facility] Performed at [place] [address], [place], [place] [address]          Radiology Studies: DG CHEST PORT 1 VIEW  Result Date: 09/13/2019 CLINICAL DATA:   Respiratory failure EXAM: PORTABLE CHEST 1 VIEW COMPARISON:  September 11, 2019 FINDINGS: The heart size and mediastinal contours are within normal limits. Minimal subsegmental atelectasis seen at both lung bases. The visualized skeletal structures are unremarkable. IMPRESSION: Minimal bibasilar subsegmental atelectasis. Electronically Signed   By: Prudencio Pair M.D.   On: 09/13/2019 05:35        Scheduled Meds: . Chlorhexidine Gluconate Cloth  6 each Topical Daily  . docusate sodium  200 mg Oral QHS  . dorzolamide  1 drop Both Eyes BID  . latanoprost  1 drop Both Eyes Daily  . metoprolol tartrate  12.5 mg Oral BID  . pantoprazole (PROTONIX) IV  40 mg Intravenous Q12H  . polyethylene glycol  17 g Oral Daily  . rivaroxaban  15 mg Oral Daily  . tamsulosin  0.4 mg Oral QHS  . timolol  1 drop Both Eyes BID   Continuous Infusions: . cefTRIAXone (ROCEPHIN)  IV Stopped (09/13/19 1229)     LOS: 4 days     Cordelia Poche, MD [facility] 09/14/2019, 9:44 AM  If 7PM-7AM, please contact night-coverage [email or web address]

## 2019-09-14 NOTE — Progress Notes (Signed)
PHARMACIST - PHYSICIAN COMMUNICATION  DR:   Dr. Jacquelin Hawking   CONCERNING: IV to Oral Route Change Policy  RECOMMENDATION: This patient is receiving Protonix  by the intravenous route.  Based on criteria approved by the Pharmacy and Therapeutics Committee, the intravenous medication(s) is/are being converted to the equivalent oral dose form(s).   DESCRIPTION: These criteria include:  The patient is eating (either orally or via tube) and/or has been taking other orally administered medications for a least 24 hours  The patient has no evidence of active gastrointestinal bleeding or impaired GI absorption (gastrectomy, short bowel, patient on TNA or NPO).  If you have questions about this conversion, please contact the Pharmacy Department  []   [phone] )  ( [phone] []   [phone] )  [facility] []   [phone] )  [facility] []   [phone] )  [facility] [x]   [phone] )  [facility] Thurmond Hildebran, Student-PharmD 09/14/2019 11:44 AM

## 2019-09-15 ENCOUNTER — Encounter (HOSPITAL_COMMUNITY)
Admission: EM | Disposition: A | Discharge status: Home Health | Payer: Self-pay | Source: Home / Self Care | Attending: Family Medicine

## 2019-09-15 ENCOUNTER — Inpatient Hospital Stay (HOSPITAL_COMMUNITY): Payer: BC Managed Care – PPO | Admitting: Anesthesiology

## 2019-09-15 ENCOUNTER — Encounter (HOSPITAL_COMMUNITY): Payer: Self-pay | Admitting: Internal Medicine

## 2019-09-15 DIAGNOSIS — R7401 Elevation of levels of liver transaminase levels: Secondary | ICD-10-CM

## 2019-09-15 HISTORY — PX: ESOPHAGOGASTRODUODENOSCOPY (EGD) WITH PROPOFOL: SHX5813

## 2019-09-15 LAB — CBC
HCT: 32.8 % — ABNORMAL LOW (ref 39.0–52.0)
Hemoglobin: 10.8 g/dL — ABNORMAL LOW (ref 13.0–17.0)
MCH: 31.1 pg (ref 26.0–34.0)
MCHC: 32.9 g/dL (ref 30.0–36.0)
MCV: 94.5 fL (ref 80.0–100.0)
Platelets: 420 10*3/uL — ABNORMAL HIGH (ref 150–400)
RBC: 3.47 MIL/uL — ABNORMAL LOW (ref 4.22–5.81)
RDW: 13.6 % (ref 11.5–15.5)
WBC: 7.3 10*3/uL (ref 4.0–10.5)
nRBC: 0 % (ref 0.0–0.2)

## 2019-09-15 SURGERY — ESOPHAGOGASTRODUODENOSCOPY (EGD) WITH PROPOFOL
Anesthesia: Monitor Anesthesia Care

## 2019-09-15 MED ORDER — PROPOFOL 500 MG/50ML IV EMUL
INTRAVENOUS | Status: DC | PRN
  Administered 2019-09-15: 75 ug/kg/min via INTRAVENOUS

## 2019-09-15 MED ORDER — PHENYLEPHRINE 40 MCG/ML (10ML) SYRINGE FOR IV PUSH (FOR BLOOD PRESSURE SUPPORT)
PREFILLED_SYRINGE | INTRAVENOUS | Status: DC | PRN
  Administered 2019-09-15: 80 ug via INTRAVENOUS

## 2019-09-15 MED ORDER — SODIUM CHLORIDE 0.9 % IV SOLN: INTRAVENOUS | Status: DC

## 2019-09-15 MED ORDER — LACTATED RINGERS IV SOLN: INTRAVENOUS | Status: DC

## 2019-09-15 MED ORDER — PROPOFOL 10 MG/ML IV BOLUS
INTRAVENOUS | Status: AC
  Filled 2019-09-15: qty 40

## 2019-09-15 SURGICAL SUPPLY — 15 items

## 2019-09-15 NOTE — Progress Notes (Signed)
Pt had an episode of hemoptysis/hematemesis. He spit approx 50cc of blood with one blood clot present into the container, then it resolved. We saved this at the bedside for the MD to visualize this AM. Mick Sell RN

## 2019-09-15 NOTE — Progress Notes (Signed)
PROGRESS NOTE    Joe Cantu  TDV:[id number] DOB: 06-May-1951 DOA: 09/10/2019 PCP: Iona Beard, MD   Brief Narrative: Joe Cantu is a 68 y.o. male with a history of frequent PVCs, massive PE, left lower extremity DVT s/p IVC filter, factor v Leiden (heterozygous). Patient presented secondary to hemoptysis in setting of Xarelto use for PE. Bronchoscopy performed which was not significant for acute bleed. Xarelto restarted and patient has recurrent hemoptysis. Hemoglobin appears to be stable at this time.   Assessment & Plan:   Principal Problem:   Hemoptysis Active Problems:   Aortic root dilatation (HCC)   Frequent unifocal PVCs   Acute massive pulmonary embolism (HCC)   DVT (deep venous thrombosis) (HCC)   Hemoptysis In setting of recent initiation of anticoagulation and coughing spells. Xarelto initially held and pulmonology consulted. No recurrent hemoptysis and hemoglobin has been stable. Pulmonology performed a bronchoscopy on 3/2 which was significant for no bleeding source. Oncology was consulted for recommendations on Xarelto dose in setting of acute PE (massive) in addition to extensive LE DVT s/p IVC filter and recommendation to decrease dose to Xarelto 15 mg daily to complete currently available prescription followed by reduction to Xarelto 10 mg. Patient with recurrent ?hemoptysis. Discussed with pulmonology with concern this may not be originating from airway; recommending GI consult. GI planning EGD today. BAL without malignant cells. Hemoglobin is stable in setting of continued hemoptysis vs hematemesis. PT recommending SNF. -Will hold Xarelto this morning (on Xarelto 15 mg daily). Since he is not dropping his hemoglobin, inclined to continue anticoagulation, however. -GI recommendations: EGD on 3/5 -Speech therapy recommendations pending -OT eval  History of massive PE History of left LE DVT Factor V Leiden, heterozygous Patient was on Xarelto 15 mg BID prior to  admission. IVC filter also placed prior to admission. Patient's Xarelto dose has been adjusted as mentioned above to Xarelto 15 mg daily followed by Xarelto 10 mg daily. Patient has follow up with hematology scheduled.  Acute transaminitis In setting of massive PE and RV dysfunction. Improved.  Frequent PVCs -Continue metoprolol  Prolonged QTc Noted.  Hypokalemia Resolved.   DVT prophylaxis: Xarelto Code Status:   Code Status: Full Code Family Communication: None at bedside Disposition Plan: Discharge pending continued workup for etiology of hemoptysis vs hematemesis and likely SNF discharge   Consultants:   Pulmonology  Hematology  Procedures:   BRONCHOSCOPY (3/2) Impression:      - Hemoptysis      - The airway examination did not show endbronchial abnormalities or       active bleeding. Blood tinged mucus and old blood noted in the bilateral       airways.      - Bronchoalveolar lavage was performed.  Antimicrobials:  None    Subjective: Recurrent hemoptysis this morning.  Objective: Vitals:   09/14/19 1347 09/14/19 1420 09/14/19 2135 09/15/19 0626  BP: (!) 124/93  (!) 135/97 (!) 143/81  Pulse: (!) 42  82 76  Resp: _0 Temp: (!) 97.5 F (36.4 C)  (!) 97.3 F (36.3 C) 97.9 F (36.6 C)  TempSrc: Oral  Oral Oral  SpO2: 100% 100% 98% 98%  Weight:      Height:        Intake/Output Summary (Last 24 hours) at 09/15/2019 1009 Last data filed at 09/15/2019 0657 Gross per 24 hour  Intake 240 ml  Output 1325 ml  Net -1085 ml   Filed Weights   09/10/19 1537  09/10/19 2054 09/12/19 0846  Weight: 77.1 kg 77.3 kg 77.3 kg    Examination:  General exam: Appears calm and comfortable Respiratory system: Diminished with mild rales. Respiratory effort normal. Cardiovascular system: S1 & S2 heard, RRR. No murmurs, rubs, gallops or clicks. Gastrointestinal system: Abdomen is nondistended, soft and nontender. No organomegaly or masses felt. Normal bowel  sounds heard. Central nervous system: Alert and oriented. No focal neurological deficits. Extremities: No edema. No calf tenderness Skin: No cyanosis. No rashes Psychiatry: Judgement and insight appear normal. Mood & affect appropriate.    Data Reviewed: I have personally reviewed following labs and imaging studies  CBC: Recent Labs  Lab 09/10/19 1542 09/10/19 1542 09/11/19 0317 09/11/19 0317 09/12/19 0148 09/13/19 0255 09/13/19 1453 09/14/19 0514 09/15/19 0838  WBC 14.2*   < > 16.7*  --  13.3* 12.2*  --  7.3 7.3  NEUTROABS 12.3*  --   --   --   --   --   --   --   --   HGB 10.4*   < > 11.0*   < > 10.4* 9.8* 10.7* 10.6* 10.8*  HCT 31.8*   < > 32.8*   < > 32.4* 31.2* 33.5* 32.4* 32.8*  MCV 93.8   < > 92.7  --  95.9 95.4  --  94.7 94.5  PLT 312   < > 328  --  325 308  --  358 420*   < > = values in this interval not displayed.   Basic Metabolic Panel: Recent Labs  Lab 09/10/19 1542 09/10/19 2026 09/11/19 0317 09/12/19 0148 09/13/19 0255 09/14/19 0514  NA 132*  --  139 138 135 134*  K 3.3*  --  4.7 3.8 3.4* 3.9  CL 101  --  105 107 107 106  CO2 23  --  24 21* 19* 19*  GLUCOSE 124*  --  110* 113* 124* 101*  BUN 17  --  _0 CREATININE 0.92  --  0.88 0.83 0.76 0.72  CALCIUM 8.8*  --  8.9 9.3 8.8* 9.4  MG  --  1.9  --   --   --   --    GFR: Estimated Creatinine Clearance: 99.3 mL/min (by C-G formula based on SCr of 0.72 mg/dL). Liver Function Tests: Recent Labs  Lab 09/10/19 1542 09/11/19 0317 09/12/19 0643  AST 134* 70* 41  ALT 110* 89* 69*  ALKPHOS 211* 173* 152*  BILITOT 1.3* 1.3* 1.8*  PROT 6.5 6.0* 5.8*  ALBUMIN 2.3* 2.2* 2.0*   Recent Labs  Lab 09/10/19 1542  LIPASE 31   No results for input(s): AMMONIA in the last 168 hours. Coagulation Profile: Recent Labs  Lab 09/10/19 1542 09/12/19 0643  INR 2.7* 1.3*   Cardiac Enzymes: No results for input(s): CKTOTAL, CKMB, CKMBINDEX, TROPONINI in the last 168 hours. BNP (last 3 results) No  results for input(s): PROBNP in the last 8760 hours. HbA1C: No results for input(s): HGBA1C in the last 72 hours. CBG: No results for input(s): GLUCAP in the last 168 hours. Lipid Profile: No results for input(s): CHOL, HDL, LDLCALC, TRIG, CHOLHDL, LDLDIRECT in the last 72 hours. Thyroid Function Tests: No results for input(s): TSH, T4TOTAL, FREET4, T3FREE, THYROIDAB in the last 72 hours. Anemia Panel: No results for input(s): VITAMINB12, FOLATE, FERRITIN, TIBC, IRON, RETICCTPCT in the last 72 hours. Sepsis Labs: Recent Labs  Lab 09/10/19 2026  PROCALCITON 1.53    Recent Results (from the past 240 hour(s))  SARS CORONAVIRUS 2 (TAT 6-24 HRS) Nasopharyngeal Nasopharyngeal Swab     Status: None   Collection Time: 09/10/19  5:00 PM   Specimen: Nasopharyngeal Swab  Result Value Ref Range Status   SARS Coronavirus 2 NEGATIVE NEGATIVE Final    Comment: (NOTE) SARS-CoV-2 target nucleic acids are NOT DETECTED. The SARS-CoV-2 RNA is generally detectable in upper and lower respiratory specimens during the acute phase of infection. Negative results do not preclude SARS-CoV-2 infection, do not rule out co-infections with other pathogens, and should not be used as the sole basis for treatment or other patient management decisions. Negative results must be combined with clinical observations, patient history, and epidemiological information. The expected result is Negative. Fact Sheet for Patients: SugarRoll.be Fact Sheet for Healthcare Providers: https://www.woods-mathews.com/ This test is not yet approved or cleared by the [place] FDA and  has been authorized for detection and/or diagnosis of SARS-CoV-2 by FDA under an Emergency Use Authorization (EUA). This EUA will remain  in effect (meaning this test can be used) for the duration of the COVID-19 declaration under Section 56 4(b)(1) of the Act, 21 U.S.C. section 360bbb-3(b)(1), unless the  authorization is terminated or revoked sooner. Performed at [facility], [place] [address] [place], [place] [address]   MRSA PCR Screening     Status: None   Collection Time: 09/10/19  8:05 PM   Specimen: Nasal Mucosa; Nasopharyngeal  Result Value Ref Range Status   MRSA by PCR NEGATIVE NEGATIVE Final    Comment:        The GeneXpert MRSA Assay (FDA approved for NASAL specimens only), is one component of a comprehensive MRSA colonization surveillance program. It is not intended to diagnose MRSA infection nor to guide or monitor treatment for MRSA infections. Performed at [facility], [place] [address] [place], [place] [address]   Culture, blood (Routine X 2) w Reflex to ID Panel     Status: Abnormal   Collection Time: 09/10/19  8:26 PM   Specimen: BLOOD  Result Value Ref Range Status   Specimen Description   Final    BLOOD RIGHT ANTECUBITAL Performed at [place] [address] [place], [place] [address]    Special Requests   Final    BOTTLES DRAWN AEROBIC ONLY Blood Culture adequate volume Performed at [place] [address] [place], [place] [address]    Culture  Setup Time   Final    AEROBIC BOTTLE ONLY GRAM NEGATIVE RODS CRITICAL RESULT CALLED TO, READ BACK BY AND VERIFIED WITH: L POINDEXTER PHARMD 09/11/19 2021 JDW Performed at [facility], [place] [address] [place], [place] [address]    Culture ESCHERICHIA COLI (A)  Final   Report Status 09/13/2019 FINAL  Final   Organism ID, Bacteria ESCHERICHIA COLI  Final      Susceptibility   Escherichia coli - MIC*    AMPICILLIN <=2 SENSITIVE Sensitive     CEFAZOLIN <=4 SENSITIVE Sensitive     CEFEPIME <=0.12 SENSITIVE Sensitive     CEFTAZIDIME <=1 SENSITIVE Sensitive     CEFTRIAXONE <=0.25 SENSITIVE Sensitive     CIPROFLOXACIN <=0.25 SENSITIVE Sensitive     GENTAMICIN <=1 SENSITIVE Sensitive     IMIPENEM <=0.25 SENSITIVE Sensitive      TRIMETH/SULFA <=20 SENSITIVE Sensitive     AMPICILLIN/SULBACTAM <=2 SENSITIVE Sensitive     PIP/TAZO <=4 SENSITIVE Sensitive     * ESCHERICHIA COLI  Culture, blood (Routine X 2) w Reflex  to ID Panel     Status: Abnormal   Collection Time: 09/10/19  8:26 PM   Specimen: BLOOD  Result Value Ref Range Status   Specimen Description   Final    BLOOD RIGHT PORTA CATH Performed at [place] [address] [place], [place] [address]    Special Requests   Final    BOTTLES DRAWN AEROBIC ONLY Blood Culture results may not be optimal due to an inadequate volume of blood received in culture bottles Performed at [facility] [address], [place], [place] [address]    Culture  Setup Time   Final    AEROBIC BOTTLE ONLY GRAM NEGATIVE RODS CRITICAL VALUE NOTED.  VALUE IS CONSISTENT WITH PREVIOUSLY REPORTED AND CALLED VALUE.    Culture (A)  Final    ESCHERICHIA COLI SUSCEPTIBILITIES PERFORMED ON PREVIOUS CULTURE WITHIN THE LAST 5 DAYS. Performed at [facility], [place] [address] [place], [place] [address]    Report Status 09/13/2019 FINAL  Final  Blood Culture ID Panel (Reflexed)     Status: Abnormal   Collection Time: 09/10/19  8:26 PM  Result Value Ref Range Status   Enterococcus species NOT DETECTED NOT DETECTED Final   Listeria monocytogenes NOT DETECTED NOT DETECTED Final   Staphylococcus species NOT DETECTED NOT DETECTED Final   Staphylococcus aureus (BCID) NOT DETECTED NOT DETECTED Final   Streptococcus species NOT DETECTED NOT DETECTED Final   Streptococcus agalactiae NOT DETECTED NOT DETECTED Final   Streptococcus pneumoniae NOT DETECTED NOT DETECTED Final   Streptococcus pyogenes NOT DETECTED NOT DETECTED Final   Acinetobacter baumannii NOT DETECTED NOT DETECTED Final   Enterobacteriaceae species DETECTED (A) NOT DETECTED Final    Comment: Enterobacteriaceae represent a large family of gram-negative bacteria, not a single  organism. CRITICAL RESULT CALLED TO, READ BACK BY AND VERIFIED WITH: L POINDEXTER PHARMD 09/11/19 2021 JDW    Enterobacter cloacae complex NOT DETECTED NOT DETECTED Final   Escherichia coli DETECTED (A) NOT DETECTED Final    Comment: CRITICAL RESULT CALLED TO, READ BACK BY AND VERIFIED WITH: L POINDEXTER PHARMD 09/11/19 2021 JDW    Klebsiella oxytoca NOT DETECTED NOT DETECTED Final   Klebsiella pneumoniae NOT DETECTED NOT DETECTED Final   Proteus species NOT DETECTED NOT DETECTED Final   Serratia marcescens NOT DETECTED NOT DETECTED Final   Carbapenem resistance NOT DETECTED NOT DETECTED Final   Haemophilus influenzae NOT DETECTED NOT DETECTED Final   Neisseria meningitidis NOT DETECTED NOT DETECTED Final   Pseudomonas aeruginosa NOT DETECTED NOT DETECTED Final   Candida albicans NOT DETECTED NOT DETECTED Final   Candida glabrata NOT DETECTED NOT DETECTED Final   Candida krusei NOT DETECTED NOT DETECTED Final   Candida parapsilosis NOT DETECTED NOT DETECTED Final   Candida tropicalis NOT DETECTED NOT DETECTED Final    Comment: Performed at [facility], [place] [address] [place], [place] [address]  Urine culture     Status: Abnormal   Collection Time: 09/10/19 10:32 PM   Specimen: Urine, Random  Result Value Ref Range Status   Specimen Description   Final    URINE, RANDOM Performed at [place] [address] [place], [place] [address]    Special Requests   Final    NONE Performed at [facility], [place] [address], [place], [place] [address]    Culture 80,000 COLONIES/mL ESCHERICHIA COLI (A)  Final   Report Status 09/13/2019 FINAL  Final   Organism  ID, Bacteria ESCHERICHIA COLI (A)  Final      Susceptibility   Escherichia coli - MIC*    AMPICILLIN 8 SENSITIVE Sensitive     CEFAZOLIN <=4 SENSITIVE Sensitive     CEFTRIAXONE <=0.25 SENSITIVE Sensitive     CIPROFLOXACIN <=0.25 SENSITIVE Sensitive     GENTAMICIN <=1 SENSITIVE Sensitive      IMIPENEM <=0.25 SENSITIVE Sensitive     NITROFURANTOIN <=16 SENSITIVE Sensitive     TRIMETH/SULFA <=20 SENSITIVE Sensitive     AMPICILLIN/SULBACTAM <=2 SENSITIVE Sensitive     PIP/TAZO <=4 SENSITIVE Sensitive     * 80,000 COLONIES/mL ESCHERICHIA COLI  Culture, blood (Routine X 2) w Reflex to ID Panel     Status: None (Preliminary result)   Collection Time: 09/12/19  6:43 AM   Specimen: BLOOD RIGHT HAND  Result Value Ref Range Status   Specimen Description   Final    BLOOD RIGHT HAND Performed at [place] [address] [place], [place] [address]    Special Requests   Final    BOTTLES DRAWN AEROBIC AND ANAEROBIC Blood Culture adequate volume Performed at [place] [address], [place], [place] [address]    Culture   Final    NO GROWTH 2 DAYS Performed at [place] [address] [place], [place] [address]    Report Status PENDING  Incomplete  Culture, blood (Routine X 2) w Reflex to ID Panel     Status: None (Preliminary result)   Collection Time: 09/12/19  6:43 AM   Specimen: BLOOD  Result Value Ref Range Status   Specimen Description   Final    BLOOD RIGHT ARM Performed at [facility] [address], [place], [place] [address]    Special Requests   Final    BOTTLES DRAWN AEROBIC ONLY Blood Culture adequate volume Performed at [place] [facility] [address] [place], [place] [address]    Culture   Final    NO GROWTH 2 DAYS Performed at [place] [address] [place], [place] [address]    Report Status PENDING  Incomplete  Culture, respiratory     Status: None   Collection Time: 09/12/19 10:25 AM   Specimen: Bronchoalveolar Lavage; Respiratory  Result Value Ref Range Status   Specimen Description   Final    BRONCHIAL ALVEOLAR LAVAGE RLL Performed at [place] [address], [place], [place] [address]    Special Requests   Final     NONE Performed at [facility], [place] [address] [place], [place] [address]    Gram Stain   Final    RARE WBC PRESENT, PREDOMINANTLY MONONUCLEAR NO SQUAMOUS EPITHELIAL CELLS PRESENT NO ORGANISMS SEEN    Culture   Final    Consistent with normal respiratory flora. Performed at [facility], [place] [address] [place], [place] [address]    Report Status 09/14/2019 FINAL  Final  Fungus Culture With Stain     Status: None (Preliminary result)   Collection Time: 09/12/19 10:25 AM   Specimen: Bronchial Alveolar Lavage  Result Value Ref Range Status   Fungus Stain Final report  Final    Comment: (NOTE) Performed At: [facility] [place], [place] [id number] Rush Farmer MD QH:[id number]    Fungus (Mycology) Culture PENDING  Incomplete   Fungal Source BRONCHIAL ALVEOLAR LAVAGE  Final    Comment: RLL Performed at [facility], [place] [address] [place], [place] [address]   Respiratory Panel by PCR     Status: None   Collection Time: 09/12/19 10:25 AM   Specimen: Bronchial Alveolar Lavage; Respiratory  Result Value Ref Range Status   Adenovirus NOT DETECTED NOT DETECTED Final   Coronavirus 229E NOT DETECTED NOT DETECTED Final    Comment: (NOTE) The Coronavirus on the Respiratory Panel, DOES NOT test for the novel  Coronavirus (2019 nCoV)    Coronavirus HKU1 NOT DETECTED NOT DETECTED Final   Coronavirus NL63 NOT DETECTED NOT DETECTED Final   Coronavirus OC43 NOT DETECTED NOT DETECTED Final   Metapneumovirus NOT DETECTED NOT DETECTED Final   Rhinovirus / Enterovirus NOT DETECTED NOT DETECTED Final   Influenza A NOT DETECTED NOT DETECTED Final   Influenza B NOT DETECTED NOT DETECTED Final   Parainfluenza Virus 1 NOT DETECTED NOT DETECTED Final   Parainfluenza Virus 2 NOT DETECTED NOT DETECTED Final   Parainfluenza Virus 3 NOT DETECTED NOT DETECTED Final   Parainfluenza Virus 4 NOT DETECTED NOT DETECTED Final    Respiratory Syncytial Virus NOT DETECTED NOT DETECTED Final   Bordetella pertussis NOT DETECTED NOT DETECTED Final   Chlamydophila pneumoniae NOT DETECTED NOT DETECTED Final   Mycoplasma pneumoniae NOT DETECTED NOT DETECTED Final    Comment: Performed at [facility], [place] [address] [place], [place] [address]  Fungus Culture Result     Status: None   Collection Time: 09/12/19 10:25 AM  Result Value Ref Range Status   Result 1 Comment  Final    Comment: (NOTE) KOH/Calcofluor preparation:  no fungus observed. Performed At: [facility] [place], [place] [id number] Rush Farmer MD FU:[id number]   Pneumocystis smear by DFA     Status: None   Collection Time: 09/12/19 10:26 AM   Specimen: Bronchoalveolar Lavage; Respiratory  Result Value Ref Range Status   Specimen Source-PJ[facility] BRONCHIAL ALVEOLAR LAVAGE  Final    Comment: RLL   Pneumocystis jiroveci Ag NEGATIVE  Final    Comment: Performed at [facility] Performed at [place] [address] [place], [place] [address]          Radiology Studies: No results found.      Scheduled Meds: . Chlorhexidine Gluconate Cloth  6 each Topical Daily  . docusate sodium  200 mg Oral QHS  . dorzolamide  1 drop Both Eyes BID  . latanoprost  1 drop Both Eyes Daily  . metoprolol tartrate  12.5 mg Oral BID  . pantoprazole  40 mg Oral BID  . polyethylene glycol  17 g Oral Daily  . rivaroxaban  15 mg Oral Daily  . tamsulosin  0.4 mg Oral QHS  . timolol  1 drop Both Eyes BID   Continuous Infusions: . cefTRIAXone (ROCEPHIN)  IV 2 g (09/14/19 1303)     LOS: 5 days     Cordelia Poche, MD [facility] 09/15/2019, 10:09 AM  If 7PM-7AM, please contact night-coverage [email or web address]

## 2019-09-15 NOTE — Evaluation (Signed)
Clinical/Bedside Swallow Evaluation Patient Details  Name: Joe Cantu MRN: [id number] Date of Birth: February 09, 1951  Today's Date: 09/15/2019 Time: SLP Start Time (ACUTE ONLY): 4431 SLP Stop Time (ACUTE ONLY): 1819 SLP Time Calculation (min) (ACUTE ONLY): 27 min  Past Medical History:  Past Medical History:  Diagnosis Date  . Aortic root dilatation ([place]) 02/21/2019  . Dyspnea on exertion   . Frequent unifocal PVCs 02/21/2019  . Glaucoma   . Gonorrhea    55 & 57 yrs of age; negative for syphillis, herpes and HIV  . Left inguinal hernia   . Pneumonia   . Ventricular premature beats   . Wears dentures    Past Surgical History:  Past Surgical History:  Procedure Laterality Date  . BRONCHIAL WASHINGS  09/12/2019   Procedure: BRONCHIAL WASHINGS;  Surgeon: Marshell Garfinkel, MD;  Location: [facility];  Service: Cardiopulmonary;;  . COLONOSCOPY  07/2018   Benign polyps   . ESOPHAGOGASTRODUODENOSCOPY (EGD) WITH PROPOFOL N/A 09/15/2019   Procedure: ESOPHAGOGASTRODUODENOSCOPY (EGD) WITH PROPOFOL;  Surgeon: Carol Ada, MD;  Location: [facility];  Service: Endoscopy;  Laterality: N/A;  . HIATAL HERNIA REPAIR    . IR ANGIOGRAM PULMONARY BILATERAL SELECTIVE  08/30/2019  . IR INFUSION THROMBOL ARTERIAL INITIAL (MS)  08/30/2019  . IR INFUSION THROMBOL ARTERIAL INITIAL (MS)  08/30/2019  . IR IVC FILTER PLMT / S&I /IMG GUID/MOD SED  08/31/2019  . IR THROMB F/U EVAL ART/VEN FINAL DAY (MS)  08/31/2019  . IR US GUIDE VASC ACCESS LEFT  08/30/2019  . IR US GUIDE VASC ACCESS LEFT  08/30/2019  . VIDEO BRONCHOSCOPY N/A 09/12/2019   Procedure: VIDEO BRONCHOSCOPY WITHOUT FLUORO;  Surgeon: Marshell Garfinkel, MD;  Location: [facility];  Service: Cardiopulmonary;  Laterality: N/A;   HPI:  69 yo male adm to [facility] with hemoptysis.  Pt with recent h/o night sweats, weight loss, recent massive bilateral PE with right heart strain, DVT was on Xarelto, ? aspiration.  Pt is s/p bronch and today endoscopy.  Old blood noted at  larynx, esophagus was clear.  Swallow eval ordered by Dr Benson Norway.  Pt denies dysphagia.   Assessment / Plan / Recommendation Clinical Impression  Patients with negative CN exam and he easily passed the 3 ounce Yale water test.  His swallow appeared timely and strong without evidence of residuals.  Pt was noted to cough after intake of 3 ounces water and cake - expectorating thin blood - stating at times "sweet things" will make him cough.  No evidence of oropharyngeal dysphagia but given pt is edentulous (upper) and few lower teeth, recommend softer foods.  Advised pt and sister Katharine Look to recommendations using teach back.  Also demonstrated use of IS per sister's request.    Recommend continue diet as tolerated or as GI/MD recommends. SLP Visit Diagnosis: Dysphagia, unspecified (R13.10)    Aspiration Risk  Mild aspiration risk    Diet Recommendation Dysphagia 3 (Mech soft);Thin liquid;Regular(sister ordering soft foods for him)   Supervision: Patient able to self feed Compensations: Slow rate;Small sips/bites Postural Changes: Seated upright at 90 degrees;Remain upright for at least 30 minutes after po intake    Other  Recommendations Oral Care Recommendations: Oral care BID   Follow up Recommendations None      Frequency and Duration     n/a       Prognosis    n/a    Swallow Study   General Date of Onset: 09/15/19 HPI: 69 yo male adm to [facility] with hemoptysis.  Pt with recent h/o night sweats, weight loss, recent massive bilateral PE with right heart strain, DVT was on Xarelto, ? aspiration.  Pt is s/p bronch and today endoscopy.  Old blood noted at larynx, esophagus was clear.  Swallow eval ordered by Dr Elnoria Howard.  Pt denies dysphagia. Diet Prior to this Study: Regular;Thin liquids Temperature Spikes Noted: No Respiratory Status: Room air History of Recent Intubation: No Behavior/Cognition: Alert;Cooperative;Pleasant mood Oral Cavity Assessment: Within Functional Limits Oral Care  Completed by SLP: No Oral Cavity - Dentition: Other (Comment)(no upper teeth, lowers few present) Vision: Functional for self-feeding Self-Feeding Abilities: Able to feed self Patient Positioning: Upright in bed Baseline Vocal Quality: Normal Volitional Cough: Strong Volitional Swallow: Able to elicit    Oral/Motor/Sensory Function Overall Oral Motor/Sensory Function: Within functional limits   Ice Chips Ice chips: Not tested   Thin Liquid Thin Liquid: Within functional limits Presentation: Straw Other Comments: 3 ounce yale water test    Nectar Thick Nectar Thick Liquid: Not tested   Honey Thick Honey Thick Liquid: Not tested   Puree Puree: Not tested   Solid     Solid: Within functional limits Presentation: Self Fed      Chales Abrahams 09/15/2019,7:33 PM   Rolena Infante, MS [facility] SLP Acute Rehab Services Office [phone]

## 2019-09-15 NOTE — Transfer of Care (Signed)
Immediate Anesthesia Transfer of Care Note  Patient: Joe Cantu  Procedure(s) Performed: Procedure(s): ESOPHAGOGASTRODUODENOSCOPY (EGD) WITH PROPOFOL (N/A)  Patient Location: PACU  Anesthesia Type:MAC  Level of Consciousness:  sedated, patient cooperative and responds to stimulation  Airway & Oxygen Therapy:Patient Spontanous Breathing and Patient connected to face mask oxgen  Post-op Assessment:  Report given to PACU RN and Post -op Vital signs reviewed and stable  Post vital signs:  Reviewed and stable  Last Vitals:  Vitals:   09/15/19 1115 09/15/19 1309  BP: (!) 130/91 (!) 82/55  Pulse: 84 80  Resp: 17 (!) 21  Temp: 36.7 C   SpO2: 03% 55%    Complications: No apparent anesthesia complications

## 2019-09-15 NOTE — Progress Notes (Signed)
OT Cancellation Note  Patient Details Name: Joe Cantu MRN: [id number] DOB: [birth year]   Cancelled Treatment:    Reason Eval/Treat Not Completed: Patient at procedure or test/ unavailable Will attempt OT evaluation on different date and time.  Shann Lewellyn OTR/L  Erik Nessel 09/15/2019, 1:26 PM

## 2019-09-15 NOTE — TOC Initial Note (Signed)
Transition of Care [facility]) - Initial/Assessment Note    Patient Details  Name: Joe Cantu MRN: [id number] Date of Birth: [birth year]  [facility]) CM/SW Contact:    Bartholome Bill, RN Phone Number: 09/15/2019, 10:08 AM  Clinical Narrative:                 Spoke with pt at bedside and sister via speakerphone about dc planning. PT eval and recommendations gone over briefly. Pt and sister hope that pt can come back home at dc and will make the decision about that after pt has his procedure today. Pt states that his sister is home with him most of the time. Pt is requesting RW and 3in1. Anticipate need for HHPT as well. TOC will continue to follow  Expected Discharge Plan: Home w Home Health Services Barriers to Discharge: Continued Medical Work up  Expected Discharge Plan and Services Expected Discharge Plan: Home w Home Health Services   Discharge Planning Services: CM Consult   Living arrangements for the past 2 months: Single Family Home Expected Discharge Date: (unknown)                                    Prior Living Arrangements/Services Living arrangements for the past 2 months: Single Family Home Lives with:: Siblings                   Activities of Daily Living Home Assistive Devices/Equipment: Dentures (specify type), Cane (specify quad or straight), Walker (specify type), Other (Comment)(single point cane, front wheeled walker, upper/lower dentures, walk-in shower, tub/shower unit) ADL Screening (condition at time of admission) Patient's cognitive ability adequate to safely complete daily activities?: Yes Is the patient deaf or have difficulty hearing?: No Does the patient have difficulty seeing, even when wearing glasses/contacts?: No Does the patient have difficulty concentrating, remembering, or making decisions?: No Patient able to express need for assistance with ADLs?: Yes Does the patient have difficulty dressing or bathing?:  No Independently performs ADLs?: Yes (appropriate for developmental age) Does the patient have difficulty walking or climbing stairs?: No Weakness of Legs: None Weakness of Arms/Hands: None  Permission Sought/Granted                  Emotional Assessment Appearance:: Appears older than stated age Attitude/Demeanor/Rapport: Engaged Affect (typically observed): Appropriate Orientation: : Oriented to Self, Oriented to Place, Oriented to  Time, Oriented to Situation      Admission diagnosis:  Transaminitis [R74.01] Hemoptysis [R04.2] Patient Active Problem List   Diagnosis Date Noted  . Hemoptysis 09/10/2019  . DVT (deep venous thrombosis) (HCC)   . Syncope and collapse   . Acute massive pulmonary embolism (HCC) 08/30/2019  . Aortic root dilatation (HCC) 02/21/2019  . Frequent unifocal PVCs 02/21/2019  . Palpitations 09/19/2018  . Inguinal hernia without mention of obstruction or gangrene, unilateral or unspecified, (not specified as recurrent)-right 11/24/2012   PCP:  [name], MD Pharmacy:   [facility] [address] ([address]), [place] [address] [place] ([place]) [place] [address] Phone: [phone] Fax: [phone]     Social Determinants of Health (SDOH) Interventions    Readmission Risk Interventions No flowsheet data found.

## 2019-09-15 NOTE — Progress Notes (Signed)
NAME:  Joe Cantu, MRN:  [id number], DOB:  1951/03/10, LOS: 5 ADMISSION DATE:  09/10/2019, CONSULTATION DATE:  09/09/2018 REFERRING MD:  Rod Can MD, CHIEF COMPLAINT:  PE, Hemoptysis  Brief History   Consult for hemoptysis Hospitalized after syncopal episode and was found to have massive bilateral PE with right heart strain. Received catheter directed TPA on 2/17 and IVC filter was placed on 2/18. Patient experienced bleeding at groins site during TPA administration requiring throbin pad and sand bag placement.  Patient was initially placed on heparin drip, subsequently transitioned to Xarelto.  Hospital follow up with NP Clent Ridges  2/26 on room air no cp, hemoptysis then acute onset hemoptysis p eating lunch ? Gagged?  on day of admit x ? 150cc of dark blood to brought to ER where no drop in hgb and no acute change on cxr and PCCM service asked to consult. Note w/u also c/w ?liver dz with INR elevation.  Note pt denies any increase sob with onset of hemoptysis, also no cp, no sob over baseline, no assoc epistaxis. Has not had any recurrent bleeding since arrival with mostly dry cough.  Past Medical History    has a past medical history of Aortic root dilatation (HCC) (02/21/2019), Dyspnea on exertion, Frequent unifocal PVCs (02/21/2019), Glaucoma, Gonorrhea, Left inguinal hernia, Pneumonia, Ventricular premature beats, and Wears dentures.  Significant Hospital Events   2/28 Admit  Consults:  PCCM, hematology  Procedures:    Significant Diagnostic Tests:  Right upper quadrant ultrasound 09/10/2019- Normal study  CTA 09/11/2019-PEs which are slightly decreased in size.  Mild bibasal atelectasis/consolidation.  Echocardiogram 09/11/2019-EF 50-55%, grade 1 diastolic dysfunction edema overload, moderate pulmonary hypertension, RVSP 56.4. (RVSP was 76 on 2/17)  Micro Data:  Blood culture 2/28 >> E. coli  Antimicrobials:  Zosyn 2/28 >> 3/3   Interim history/subjective:  Sitting up in bed with  no acute complaints, reported one episode of coughing up bright red blood overnight   Objective   Blood pressure (!) 143/81, pulse 76, temperature 97.9 F (36.6 C), temperature source Oral, resp. rate 16, height 6\' 3"  (1.905 m), weight 77.3 kg, SpO2 98 %.        Intake/Output Summary (Last 24 hours) at 09/15/2019 0945 Last data filed at 09/15/2019 0657 Gross per 24 hour  Intake 240 ml  Output 1325 ml  Net -1085 ml   Filed Weights   09/10/19 1537 09/10/19 2054 09/12/19 0846  Weight: 77.1 kg 77.3 kg 77.3 kg    Examination: Today's Vitals   09/14/19 1420 09/14/19 2135 09/14/19 2154 09/15/19 0626  BP:  (!) 135/97  (!) 143/81  Pulse:  82  76  Resp:  17  16  Temp:  (!) 97.3 F (36.3 C)  97.9 F (36.6 C)  TempSrc:  Oral  Oral  SpO2: 100% 98%  98%  Weight:      Height:      PainSc:   0-No pain    Body mass index is 21.3 kg/m.  Physical exam  General: Thin middle aged male sitting up in bed , in NAD HEENT: [place]/AT, MM pink/moist, PERRL, no blood or swelling noted in oropharynx  Neuro: Alert and oriented x3, non-focal  CV: s1s2 regular rate and rhythm, no murmur, rubs, or gallops,  PULM:  Clear to ascultation bilaterally, no increased work of breathing no added breath sounds,  GI: soft, bowel sounds active in all 4 quadrants, non-tender, non-distended Extremities: warm/dry, no edema  Skin: no rashes or lesions  Resolved Hospital Problem list     Assessment & Plan:  Hemoptysis in the setting of anticoagulation -Imaging, history, and bronch less c/w pulmonary source -Approximately 5occ bright red blood observed in bedside container  P: Upper endoscopy scheduled today  Continue oral anticoagulation at this time HGB remains stable  Hemodynamicly stable   Recent submassive PE s/p cdTPA 2/17, IVC filter,  Factor 5 leiden P: Due for IVCF removal late May 2021 Xarelto dosing reduced per GI  Due for repeat ECHO in 3-4 months  Will require life long anticoagulation    E  Coli bacteremia -source urine, E.coli seen on urine culture  P: Continue ceftriaxone   Best practice:  Diet: NPO Pain/Anxiety/Delirium protocol (if indicated): PRns VAP protocol (if indicated): N/A DVT prophylaxis: Xarelto GI prophylaxis: PPI Glucose control: Monitor  Mobility: Up with assist  Code Status: Full Family Communication: Updated per primary  Disposition: Floor   Labs   CBC: Recent Labs  Lab 09/10/19 1542 09/10/19 1542 09/11/19 0317 09/11/19 0317 09/12/19 0148 09/13/19 0255 09/13/19 1453 09/14/19 0514 09/15/19 0838  WBC 14.2*   < > 16.7*  --  13.3* 12.2*  --  7.3 7.3  NEUTROABS 12.3*  --   --   --   --   --   --   --   --   HGB 10.4*   < > 11.0*   < > 10.4* 9.8* 10.7* 10.6* 10.8*  HCT 31.8*   < > 32.8*   < > 32.4* 31.2* 33.5* 32.4* 32.8*  MCV 93.8   < > 92.7  --  95.9 95.4  --  94.7 94.5  PLT 312   < > 328  --  325 308  --  358 420*   < > = values in this interval not displayed.    Basic Metabolic Panel: Recent Labs  Lab 09/10/19 1542 09/10/19 2026 09/11/19 0317 09/12/19 0148 09/13/19 0255 09/14/19 0514  NA 132*  --  139 138 135 134*  K 3.3*  --  4.7 3.8 3.4* 3.9  CL 101  --  105 107 107 106  CO2 23  --  24 21* 19* 19*  GLUCOSE 124*  --  110* 113* 124* 101*  BUN 17  --  15 15 14 12   CREATININE 0.92  --  0.88 0.83 0.76 0.72  CALCIUM 8.8*  --  8.9 9.3 8.8* 9.4  MG  --  1.9  --   --   --   --    GFR: Estimated Creatinine Clearance: 99.3 mL/min (by C-G formula based on SCr of 0.72 mg/dL). Recent Labs  Lab 09/10/19 2026 09/11/19 0317 09/12/19 0148 09/13/19 0255 09/14/19 0514 09/15/19 0838  PROCALCITON 1.53  --   --   --   --   --   WBC  --    < > 13.3* 12.2* 7.3 7.3   < > = values in this interval not displayed.    Liver Function Tests: Recent Labs  Lab 09/10/19 1542 09/11/19 0317 09/12/19 0643  AST 134* 70* 41  ALT 110* 89* 69*  ALKPHOS 211* 173* 152*  BILITOT 1.3* 1.3* 1.8*  PROT 6.5 6.0* 5.8*  ALBUMIN 2.3* 2.2* 2.0*    Recent Labs  Lab 09/10/19 1542  LIPASE 31   No results for input(s): AMMONIA in the last 168 hours.  ABG    Component Value Date/Time   TCO2 22 08/30/2019 0453     Coagulation Profile: Recent Labs  Lab 09/10/19  1542 09/12/19 0643  INR 2.7* 1.3*    Cardiac Enzymes: No results for input(s): CKTOTAL, CKMB, CKMBINDEX, TROPONINI in the last 168 hours.  HbA1C: No results found for: HGBA1C  CBG: No results for input(s): GLUCAP in the last 168 hours.  Signature:   Johnsie Cancel, NP-C [facility] Contact / Pager information can be found on Amion  09/15/2019, 10:07 AM

## 2019-09-15 NOTE — Op Note (Signed)
[facility] Patient Name: Joe Cantu Procedure Date: 09/15/2019 MRN: [id number] Attending MD: Jeani Hawking , MD Date of Birth: Oct 28, 1950 CSN: [id number] Age: 69 Admit Type: Inpatient Procedure:                Upper GI endoscopy Indications:              Hematemesis Providers:                Jeani Hawking, MD, Tillie Fantasia, RN, Wanita Chamberlain,                            Technician, Paris Lore CRNA, CRNA, Norman Clay, RN Referring MD:              Medicines:                Propofol per Anesthesia Complications:            No immediate complications. Estimated Blood Loss:     Estimated blood loss: none. Procedure:                Pre-Anesthesia Assessment:                           - Prior to the procedure, a History and Physical                            was performed, and patient medications and                            allergies were reviewed. The patient's tolerance of                            previous anesthesia was also reviewed. The risks                            and benefits of the procedure and the sedation                            options and risks were discussed with the patient.                            All questions were answered, and informed consent                            was obtained. Prior Anticoagulants: The patient has                            taken Xarelto (rivaroxaban), last dose was day of                            procedure. ASA Grade Assessment: III - A patient  with severe systemic disease. After reviewing the                            risks and benefits, the patient was deemed in                            satisfactory condition to undergo the procedure.                           - Sedation was administered by an anesthesia                            professional. Deep sedation was attained.                           After obtaining informed consent, the endoscope was                             passed under direct vision. Throughout the                            procedure, the patient's blood pressure, pulse, and                            oxygen saturations were monitored continuously. The                            GIF-H190 ([id number] Olympus gastroscope was                            introduced through the mouth, and advanced to the                            second part of duodenum. The upper GI endoscopy was                            accomplished without difficulty. The patient                            tolerated the procedure well. Scope In: Scope Out: Findings:      The esophagus was normal.      The stomach was normal.      The examined duodenum was normal.      Upon insertion of the upper endoscope, visualization of the vocal cords       and the proximal trachea showed old blood. There was NO evidence of any       bleeding from the upper GI tract. The bleeding can be isolated to a       pulmonary source. Impression:               - Normal esophagus.                           - Normal stomach.                           -  Normal examined duodenum.                           - No specimens collected. Moderate Sedation:      Not Applicable - Patient had care per Anesthesia. Recommendation:           - Return patient to hospital ward for ongoing care.                           - Resume regular diet.                           - Continue present medications.                           - Management per Pulmonary.                           - No further GI evaluation at this time. Signing                            off. Procedure Code(s):        --- Professional ---                           [phone], Esophagogastroduodenoscopy, flexible,                            transoral; diagnostic, including collection of                            specimen(s) by brushing or washing, when performed                            (separate procedure) Diagnosis  Code(s):        --- Professional ---                           K92.0, Hematemesis CPT copyright 2019 [employer]. All rights reserved. The codes documented in this report are preliminary and upon coder review may  be revised to meet current compliance requirements. Carol Ada, MD Carol Ada, MD 09/15/2019 1:13:33 PM This report has been signed electronically. Number of Addenda: 0

## 2019-09-15 NOTE — Interval H&P Note (Signed)
History and Physical Interval Note:  09/15/2019 12:40 PM  Joe Cantu  has presented today for surgery, with the diagnosis of Hematemesis.  The various methods of treatment have been discussed with the patient and family. After consideration of risks, benefits and other options for treatment, the patient has consented to  Procedure(s): ESOPHAGOGASTRODUODENOSCOPY (EGD) WITH PROPOFOL (N/A) as a surgical intervention.  The patient's history has been reviewed, patient examined, no change in status, stable for surgery.  I have reviewed the patient's chart and labs.  Questions were answered to the patient's satisfaction.     Virgie Kunda D

## 2019-09-15 NOTE — Progress Notes (Signed)
Joe Cantu   DOB:1950-09-30   WN#:[id number]   XFG#:[id number]  Subjective:  Joe Cantu again had some blood (about 30cc in the container). He feels weak and is not sure he can ambulate safely in halls. He is up for EGD today in case the source of bleeding is GI, as bronchoscopy was unrevealing. He denies other bleeding or bruising. He is otherwise tolerating rivaroxaban 15 mg/d well  Objective: middle aged [place] American man examined in bed Vitals:   09/15/19 0626 09/15/19 1115  BP: (!) 143/81 (!) 130/91  Pulse: 76 84  Resp: 16 17  Temp: 97.9 F (36.6 C) 98 F (36.7 C)  SpO2: 98% 97%    Body mass index is 21.3 kg/m.  Intake/Output Summary (Last 24 hours) at 09/15/2019 1211 Last data filed at 09/15/2019 0657 Gross per 24 hour  Intake 240 ml  Output 1325 ml  Net -1085 ml    CBG (last 3)  No results for input(s): GLUCAP in the last 72 hours.   Labs:  Lab Results  Component Value Date   WBC 7.3 09/15/2019   HGB 10.8 (L) 09/15/2019   HCT 32.8 (L) 09/15/2019   MCV 94.5 09/15/2019   PLT 420 (H) 09/15/2019   NEUTROABS 12.3 (H) 09/10/2019    _0 @  Urine Studies No results for input(s): UHGB, CRYS in the last 72 hours.  Invalid input(s): UACOL, UAPR, USPG, UPH, UTP, UGL, UKET, UBIL, UNIT, UROB, ULEU, UEPI, UWBC, URBC, UBAC, CAST, [place], [place]  Basic Metabolic Panel: Recent Labs  Lab 09/10/19 1542 09/10/19 1542 09/10/19 2026 09/11/19 0317 09/11/19 0317 09/12/19 0148 09/12/19 0148 09/13/19 0255 09/14/19 0514  NA 132*  --   --  139  --  138  --  135 134*  K 3.3*   < >  --  4.7   < > 3.8   < > 3.4* 3.9  CL 101  --   --  105  --  107  --  107 106  CO2 23  --   --  24  --  21*  --  19* 19*  GLUCOSE 124*  --   --  110*  --  113*  --  124* 101*  BUN 17  --   --  15  --  15  --  14 12  CREATININE 0.92  --   --  0.88  --  0.83  --  0.76 0.72  CALCIUM 8.8*  --   --  8.9  --  9.3  --  8.8* 9.4  MG  --   --  1.9  --   --   --   --   --   --    < > = values in this  interval not displayed.   GFR Estimated Creatinine Clearance: 99.3 mL/min (by C-G formula based on SCr of 0.72 mg/dL). Liver Function Tests: Recent Labs  Lab 09/10/19 1542 09/11/19 0317 09/12/19 0643  AST 134* 70* 41  ALT 110* 89* 69*  ALKPHOS 211* 173* 152*  BILITOT 1.3* 1.3* 1.8*  PROT 6.5 6.0* 5.8*  ALBUMIN 2.3* 2.2* 2.0*   Recent Labs  Lab 09/10/19 1542  LIPASE 31   No results for input(s): AMMONIA in the last 168 hours. Coagulation profile Recent Labs  Lab 09/10/19 1542 09/12/19 0643  INR 2.7* 1.3*    CBC: Recent Labs  Lab 09/10/19 1542 09/10/19 1542 09/11/19 9678 09/11/19 9381 09/12/19 0148 09/13/19 0255 09/13/19 1453 09/14/19 0175 09/15/19 1025  WBC 14.2*   < > 16.7*  --  13.3* 12.2*  --  7.3 7.3  NEUTROABS 12.3*  --   --   --   --   --   --   --   --   HGB 10.4*   < > 11.0*   < > 10.4* 9.8* 10.7* 10.6* 10.8*  HCT 31.8*   < > 32.8*   < > 32.4* 31.2* 33.5* 32.4* 32.8*  MCV 93.8   < > 92.7  --  95.9 95.4  --  94.7 94.5  PLT 312   < > 328  --  325 308  --  358 420*   < > = values in this interval not displayed.   Cardiac Enzymes: No results for input(s): CKTOTAL, CKMB, CKMBINDEX, TROPONINI in the last 168 hours. BNP: Invalid input(s): POCBNP CBG: No results for input(s): GLUCAP in the last 168 hours. D-Dimer No results for input(s): DDIMER in the last 72 hours. Hgb A1c No results for input(s): HGBA1C in the last 72 hours. Lipid Profile No results for input(s): CHOL, HDL, LDLCALC, TRIG, CHOLHDL, LDLDIRECT in the last 72 hours. Thyroid function studies No results for input(s): TSH, T4TOTAL, T3FREE, THYROIDAB in the last 72 hours.  Invalid input(s): FREET3 Anemia work up No results for input(s): VITAMINB12, FOLATE, FERRITIN, TIBC, IRON, RETICCTPCT in the last 72 hours. Microbiology Recent Results (from the past 240 hour(s))  SARS CORONAVIRUS 2 (TAT 6-24 HRS) Nasopharyngeal Nasopharyngeal Swab     Status: None   Collection Time: 09/10/19  5:00  PM   Specimen: Nasopharyngeal Swab  Result Value Ref Range Status   SARS Coronavirus 2 NEGATIVE NEGATIVE Final    Comment: (NOTE) SARS-CoV-2 target nucleic acids are NOT DETECTED. The SARS-CoV-2 RNA is generally detectable in upper and lower respiratory specimens during the acute phase of infection. Negative results do not preclude SARS-CoV-2 infection, do not rule out co-infections with other pathogens, and should not be used as the sole basis for treatment or other patient management decisions. Negative results must be combined with clinical observations, patient history, and epidemiological information. The expected result is Negative. Fact Sheet for Patients: SugarRoll.be Fact Sheet for Healthcare Providers: https://www.woods-mathews.com/ This test is not yet approved or cleared by the [place] FDA and  has been authorized for detection and/or diagnosis of SARS-CoV-2 by FDA under an Emergency Use Authorization (EUA). This EUA will remain  in effect (meaning this test can be used) for the duration of the COVID-19 declaration under Section 56 4(b)(1) of the Act, 21 U.S.C. section 360bbb-3(b)(1), unless the authorization is terminated or revoked sooner. Performed at [facility], [place] [address], [place], [place] [address]   MRSA PCR Screening     Status: None   Collection Time: 09/10/19  8:05 PM   Specimen: Nasal Mucosa; Nasopharyngeal  Result Value Ref Range Status   MRSA by PCR NEGATIVE NEGATIVE Final    Comment:        The GeneXpert MRSA Assay (FDA approved for NASAL specimens only), is one component of a comprehensive MRSA colonization surveillance program. It is not intended to diagnose MRSA infection nor to guide or monitor treatment for MRSA infections. Performed at [facility], [place] [address], [place], [place] [address]   Culture, blood (Routine X 2) w Reflex to ID Panel     Status: Abnormal    Collection Time: 09/10/19  8:26 PM   Specimen: BLOOD  Result Value Ref Range Status  Specimen Description   Final    BLOOD RIGHT ANTECUBITAL Performed at [place] [address] [place], [place] [address]    Special Requests   Final    BOTTLES DRAWN AEROBIC ONLY Blood Culture adequate volume Performed at [place] [address], [place], [place] [address]    Culture  Setup Time   Final    AEROBIC BOTTLE ONLY GRAM NEGATIVE RODS CRITICAL RESULT CALLED TO, READ BACK BY AND VERIFIED WITH: L POINDEXTER PHARMD 09/11/19 2021 JDW Performed at [facility], [place] [address] [place], [place] [address]    Culture ESCHERICHIA COLI (A)  Final   Report Status 09/13/2019 FINAL  Final   Organism ID, Bacteria ESCHERICHIA COLI  Final      Susceptibility   Escherichia coli - MIC*    AMPICILLIN <=2 SENSITIVE Sensitive     CEFAZOLIN <=4 SENSITIVE Sensitive     CEFEPIME <=0.12 SENSITIVE Sensitive     CEFTAZIDIME <=1 SENSITIVE Sensitive     CEFTRIAXONE <=0.25 SENSITIVE Sensitive     CIPROFLOXACIN <=0.25 SENSITIVE Sensitive     GENTAMICIN <=1 SENSITIVE Sensitive     IMIPENEM <=0.25 SENSITIVE Sensitive     TRIMETH/SULFA <=20 SENSITIVE Sensitive     AMPICILLIN/SULBACTAM <=2 SENSITIVE Sensitive     PIP/TAZO <=4 SENSITIVE Sensitive     * ESCHERICHIA COLI  Culture, blood (Routine X 2) w Reflex to ID Panel     Status: Abnormal   Collection Time: 09/10/19  8:26 PM   Specimen: BLOOD  Result Value Ref Range Status   Specimen Description   Final    BLOOD RIGHT PORTA CATH Performed at [facility] [address], [place], [place] [address]    Special Requests   Final    BOTTLES DRAWN AEROBIC ONLY Blood Culture results may not be optimal due to an inadequate volume of blood received in culture bottles Performed at [facility] [place] [address], [place], [place] [address]    Culture  Setup Time   Final    AEROBIC  BOTTLE ONLY GRAM NEGATIVE RODS CRITICAL VALUE NOTED.  VALUE IS CONSISTENT WITH PREVIOUSLY REPORTED AND CALLED VALUE.    Culture (A)  Final    ESCHERICHIA COLI SUSCEPTIBILITIES PERFORMED ON PREVIOUS CULTURE WITHIN THE LAST 5 DAYS. Performed at [facility], [place] [address] [place], [place] [address]    Report Status 09/13/2019 FINAL  Final  Blood Culture ID Panel (Reflexed)     Status: Abnormal   Collection Time: 09/10/19  8:26 PM  Result Value Ref Range Status   Enterococcus species NOT DETECTED NOT DETECTED Final   Listeria monocytogenes NOT DETECTED NOT DETECTED Final   Staphylococcus species NOT DETECTED NOT DETECTED Final   Staphylococcus aureus (BCID) NOT DETECTED NOT DETECTED Final   Streptococcus species NOT DETECTED NOT DETECTED Final   Streptococcus agalactiae NOT DETECTED NOT DETECTED Final   Streptococcus pneumoniae NOT DETECTED NOT DETECTED Final   Streptococcus pyogenes NOT DETECTED NOT DETECTED Final   Acinetobacter baumannii NOT DETECTED NOT DETECTED Final   Enterobacteriaceae species DETECTED (A) NOT DETECTED Final    Comment: Enterobacteriaceae represent a large family of gram-negative bacteria, not a single organism. CRITICAL RESULT CALLED TO, READ BACK BY AND VERIFIED WITH: L POINDEXTER PHARMD 09/11/19 2021 JDW    Enterobacter cloacae complex NOT DETECTED NOT DETECTED Final   Escherichia coli DETECTED (A) NOT DETECTED Final    Comment: CRITICAL RESULT CALLED TO, READ BACK  BY AND VERIFIED WITH: L POINDEXTER PHARMD 09/11/19 2021 JDW    Klebsiella oxytoca NOT DETECTED NOT DETECTED Final   Klebsiella pneumoniae NOT DETECTED NOT DETECTED Final   Proteus species NOT DETECTED NOT DETECTED Final   Serratia marcescens NOT DETECTED NOT DETECTED Final   Carbapenem resistance NOT DETECTED NOT DETECTED Final   Haemophilus influenzae NOT DETECTED NOT DETECTED Final   Neisseria meningitidis NOT DETECTED NOT DETECTED Final   Pseudomonas aeruginosa NOT DETECTED NOT DETECTED  Final   Candida albicans NOT DETECTED NOT DETECTED Final   Candida glabrata NOT DETECTED NOT DETECTED Final   Candida krusei NOT DETECTED NOT DETECTED Final   Candida parapsilosis NOT DETECTED NOT DETECTED Final   Candida tropicalis NOT DETECTED NOT DETECTED Final    Comment: Performed at [facility], [place] [address] [place], [place] [address]  Urine culture     Status: Abnormal   Collection Time: 09/10/19 10:32 PM   Specimen: Urine, Random  Result Value Ref Range Status   Specimen Description   Final    URINE, RANDOM Performed at [place] [address], [place], [place] [address]    Special Requests   Final    NONE Performed at [facility], [place] [address], [place], [place] [address]    Culture 80,000 COLONIES/mL ESCHERICHIA COLI (A)  Final   Report Status 09/13/2019 FINAL  Final   Organism ID, Bacteria ESCHERICHIA COLI (A)  Final      Susceptibility   Escherichia coli - MIC*    AMPICILLIN 8 SENSITIVE Sensitive     CEFAZOLIN <=4 SENSITIVE Sensitive     CEFTRIAXONE <=0.25 SENSITIVE Sensitive     CIPROFLOXACIN <=0.25 SENSITIVE Sensitive     GENTAMICIN <=1 SENSITIVE Sensitive     IMIPENEM <=0.25 SENSITIVE Sensitive     NITROFURANTOIN <=16 SENSITIVE Sensitive     TRIMETH/SULFA <=20 SENSITIVE Sensitive     AMPICILLIN/SULBACTAM <=2 SENSITIVE Sensitive     PIP/TAZO <=4 SENSITIVE Sensitive     * 80,000 COLONIES/mL ESCHERICHIA COLI  Culture, blood (Routine X 2) w Reflex to ID Panel     Status: None (Preliminary result)   Collection Time: 09/12/19  6:43 AM   Specimen: BLOOD RIGHT HAND  Result Value Ref Range Status   Specimen Description   Final    BLOOD RIGHT HAND Performed at [facility] [address] [place], [place] [address]    Special Requests   Final    BOTTLES DRAWN AEROBIC AND ANAEROBIC Blood Culture adequate volume Performed at [facility] [address], [place], [place]  [address]    Culture   Final    NO GROWTH 2 DAYS Performed at [place] [address] [place], [place] [address]    Report Status PENDING  Incomplete  Culture, blood (Routine X 2) w Reflex to ID Panel     Status: None (Preliminary result)   Collection Time: 09/12/19  6:43 AM   Specimen: BLOOD  Result Value Ref Range Status   Specimen Description   Final    BLOOD RIGHT ARM Performed at [facility] [address], [place], [place] [address]    Special Requests   Final    BOTTLES DRAWN AEROBIC ONLY Blood Culture adequate volume Performed at [place] [address] [place], [place] [address]    Culture   Final    NO GROWTH 2 DAYS Performed at [facility], [address], [place], [place] [address]    Report Status PENDING  Incomplete  Culture, respiratory     Status: None   Collection Time: 09/12/19 10:25 AM   Specimen: Bronchoalveolar Lavage; Respiratory  Result Value Ref Range Status   Specimen Description   Final    BRONCHIAL ALVEOLAR LAVAGE RLL Performed at [facility] [place] [address], [place], [place] [address]    Special Requests   Final    NONE Performed at [facility], [place] [address] [place], [place] [address]    Gram Stain   Final    RARE WBC PRESENT, PREDOMINANTLY MONONUCLEAR NO SQUAMOUS EPITHELIAL CELLS PRESENT NO ORGANISMS SEEN    Culture   Final    Consistent with normal respiratory flora. Performed at [facility], [place] [address], [place], [place] [address]    Report Status 09/14/2019 FINAL  Final  Fungus Culture With Stain     Status: None (Preliminary result)   Collection Time: 09/12/19 10:25 AM   Specimen: Bronchial Alveolar Lavage  Result Value Ref Range Status   Fungus Stain Final report  Final    Comment: (NOTE) Performed At: [facility] [place], [place] [id number] Rush Farmer MD RK:[id number]    Fungus  (Mycology) Culture PENDING  Incomplete   Fungal Source BRONCHIAL ALVEOLAR LAVAGE  Final    Comment: RLL Performed at [facility], [place] [address], [place], [place] [address]   Respiratory Panel by PCR     Status: None   Collection Time: 09/12/19 10:25 AM   Specimen: Bronchial Alveolar Lavage; Respiratory  Result Value Ref Range Status   Adenovirus NOT DETECTED NOT DETECTED Final   Coronavirus 229E NOT DETECTED NOT DETECTED Final    Comment: (NOTE) The Coronavirus on the Respiratory Panel, DOES NOT test for the novel  Coronavirus (2019 nCoV)    Coronavirus HKU1 NOT DETECTED NOT DETECTED Final   Coronavirus NL63 NOT DETECTED NOT DETECTED Final   Coronavirus OC43 NOT DETECTED NOT DETECTED Final   Metapneumovirus NOT DETECTED NOT DETECTED Final   Rhinovirus / Enterovirus NOT DETECTED NOT DETECTED Final   Influenza A NOT DETECTED NOT DETECTED Final   Influenza B NOT DETECTED NOT DETECTED Final   Parainfluenza Virus 1 NOT DETECTED NOT DETECTED Final   Parainfluenza Virus 2 NOT DETECTED NOT DETECTED Final   Parainfluenza Virus 3 NOT DETECTED NOT DETECTED Final   Parainfluenza Virus 4 NOT DETECTED NOT DETECTED Final   Respiratory Syncytial Virus NOT DETECTED NOT DETECTED Final   Bordetella pertussis NOT DETECTED NOT DETECTED Final   Chlamydophila pneumoniae NOT DETECTED NOT DETECTED Final   Mycoplasma pneumoniae NOT DETECTED NOT DETECTED Final    Comment: Performed at [facility], [place] [address], [place], [place] [address]  Fungus Culture Result     Status: None   Collection Time: 09/12/19 10:25 AM  Result Value Ref Range Status   Result 1 Comment  Final    Comment: (NOTE) KOH/Calcofluor preparation:  no fungus observed. Performed At: [facility] [place], [place] [id number] Rush Farmer MD WN:[id number]   Pneumocystis smear by DFA     Status: None   Collection Time: 09/12/19 10:26 AM   Specimen: Bronchoalveolar Lavage; Respiratory   Result Value Ref Range Status   Specimen Source-[facility] BRONCHIAL ALVEOLAR LAVAGE  Final    Comment: RLL   Pneumocystis jiroveci Ag NEGATIVE  Final    Comment: Performed at [facility] Performed at [facility], [place] [address] [place], [place] [address]       Studies:  DG Chest 2 View  Result Date: 09/10/2019 CLINICAL DATA:  Hemoptysis.  Recent PE. EXAM: CHEST - 2 VIEW COMPARISON:  08/30/2019 CT FINDINGS: Lateral view degraded by patient arm position. Midline trachea. Mild cardiomegaly. Pulmonary artery prominence. Right hemidiaphragm elevation. No pleural effusion or pneumothorax. Pulmonary interstitial prominence, favored to be due to AP portable technique. Subtle right lower lobe opacity is suspected, likely corresponding to infarct or atelectasis on prior CT. IMPRESSION: Cardiomegaly, without acute disease. Right base pulmonary opacity, similar to on the prior CT. Pulmonary artery enlargement, suggesting pulmonary arterial hypertension. Electronically Signed   By: Abigail Miyamoto M.D.   On: 09/10/2019 17:19   CT ANGIO CHEST PE W OR WO CONTRAST  Result Date: 09/11/2019 CLINICAL DATA:  History of pulmonary embolus. Hemoptysis. EXAM: CT ANGIOGRAPHY CHEST WITH CONTRAST TECHNIQUE: Multidetector CT imaging of the chest was performed using the standard protocol during bolus administration of intravenous contrast. Multiplanar CT image reconstructions and MIPs were obtained to evaluate the vascular anatomy. CONTRAST:  152m OMNIPAQUE IOHEXOL 350 MG/ML SOLN COMPARISON:  August 30, 2019. FINDINGS: Cardiovascular: There remains large filling defects in the main portions of both pulmonary arteries which may be slightly decreased compared to prior exam. Mild cardiomegaly is noted. No pericardial effusion is noted. Atherosclerosis of thoracic aorta is noted without aneurysm or dissection. Mediastinum/Nodes: No enlarged mediastinal, hilar, or axillary lymph nodes. Thyroid  gland, trachea, and esophagus demonstrate no significant findings. Lungs/Pleura: No pneumothorax or pleural effusion is noted. Minimal bilateral posterior basilar subsegmental atelectasis is noted. Upper Abdomen: No acute abnormality. Musculoskeletal: No chest wall abnormality. No acute or significant osseous findings. Review of the MIP images confirms the above findings. IMPRESSION: There remains large bilateral pulmonary emboli in the main portions of the pulmonary arteries which are slightly decreased in size compared to prior exam. Aortic Atherosclerosis (ICD10-I70.0). Electronically Signed   By: JMarijo ConceptionM.D.   On: 09/11/2019 10:34   CT Angio Chest PE W and/or Wo Contrast  Result Date: 08/30/2019 CLINICAL DATA:  Syncopal episode while using the bathroom. EXAM: CT ANGIOGRAPHY CHEST WITH CONTRAST TECHNIQUE: Multidetector CT imaging of the chest was performed using the standard protocol during bolus administration of intravenous contrast. Multiplanar CT image reconstructions and MIPs were obtained to evaluate the vascular anatomy. CONTRAST:  1042mOMNIPAQUE IOHEXOL 350 MG/ML SOLN COMPARISON:  None. FINDINGS: Cardiovascular: Massive bilateral pulmonary arterial filling defects with near complete occlusion of the right pulmonary artery, no pulmonary arterial blood flow noted to the right lung. Large thrombus in the left lower lobe pulmonary artery which is partially occlusive. Marked right heart dilatation with contrast refluxing into the hepatic veins and IVC. Elevated RV to LV ratio of 2.16. Aortic atherosclerosis. Cannot assess for dissection given phase of contrast. Mild dilatation of the aortic root at 3.6 cm without aortic aneurysm. No pericardial effusion. Mediastinum/Nodes: No adenopathy. No esophageal wall thickening. No suspicious thyroid nodule. Lungs/Pleura: Triangular subpleural opacities in the right upper and lower lobes likely pulmonary infarct. Diminished pulmonary vessels in the right  lung related to pulmonary embolus. Subpleural opacities in the left lower lobe favor atelectasis. No pulmonary mass. No pulmonary edema. Upper Abdomen: Contrast refluxes into the hepatic veins and IVC. No other acute findings. Musculoskeletal: There are no acute or suspicious osseous abnormalities. Few bone islands in the right proximal humerus. Review of the MIP images confirms  the above findings. IMPRESSION: 1. Massive pulmonary emboli with complete occlusion of the right main pulmonary artery and absent pulmonary arterial blood flow to the right lung. Large but partially occlusive filling defects in the left lower lobe pulmonary arteries. Significant right heart strain with RV to LV ratio of greater than 2, right heart dilatation and contrast refluxing into the hepatic veins and IVC. 2. Small pulmonary infarcts in the right upper and lower lobe. Critical Value/emergent results were called by telephone at the time of interpretation on 08/30/2019 at 6:24 am to Dr Addison Lank , who verbally acknowledged these results. Electronically Signed   By: Keith Rake M.D.   On: 08/30/2019 06:30   IR Angiogram Pulmonary Bilateral Selective  Result Date: 08/31/2019 INDICATION: Sub massive pulmonary embolism. Request made for initiation of bilateral catheter directed pulmonary arterial thrombolysis. EXAM: 1. ULTRASOUND GUIDANCE FOR VENOUS ACCESS X2 2. PULMONARY ARTERIOGRAPHY 3. FLUOROSCOPIC GUIDED PLACEMENT OF BILATERAL PULMONARY ARTERIAL LYTIC INFUSION CATHETERS COMPARISON:  Chest CTA-earlier same day MEDICATIONS: None ANESTHESIA/SEDATION: Moderate (conscious) sedation was employed during this procedure. A total of Versed 2.5 mg and Fentanyl 100 mcg was administered intravenously. Moderate Sedation Time: 33 minutes. The patient's level of consciousness and vital signs were monitored continuously by radiology nursing throughout the procedure under my direct supervision. CONTRAST:  77m OMNIPAQUE IOHEXOL 300 MG/ML  SOLN  FLUOROSCOPY TIME:  9 minutes, 18 seconds (1295mGy) COMPLICATIONS: None immediate. TECHNIQUE: Informed written consent was obtained from the patient after a discussion of the risks, benefits and alternatives to treatment. Questions regarding the procedure were encouraged and answered. A timeout was performed prior to the initiation of the procedure. Ultrasound scanning was performed of the right groin and demonstrated near occlusive thrombus within the right common femoral vein. Sonographic evaluation the left groin demonstrates wide patency of the left common femoral vein. As such, the left common femoral vein was selected venous access. The left groin was prepped and draped in the usual sterile fashion, and a sterile drape was applied covering the operative field. Maximum barrier sterile technique with sterile gowns and gloves were used for the procedure. A timeout was performed prior to the initiation of the procedure. Local anesthesia was provided with 1% lidocaine. Under direct ultrasound guidance, the right common femoral vein was accessed with a micro puncture kit ultimately allowing placement of a 6 French, 35 cm vascular sheath. Slightly cranial to this initial access, the right common femoral was again accessed with an additional micropuncture kit ultimately allowing placement of an additional 7 French, 35 cm vascular sheath. Ultrasound images were saved for procedural documentation purposes. With the use of a stiff glidewire, a vertebral catheter was advanced into the main pulmonary artery and a limited central pulmonary arteriogram was performed. Pressure measurements were then obtained from the main pulmonary artery. The vertebral catheter was advanced into the distal branch of the left lower lobe pulmonary artery. Limited contrast injection confirmed appropriate positioning. Over an exchange length Rosen wire, the vertebral catheter was exchanged for a 90/10 cm multi side-hole infusion catheter. Again,  with the use of a stiff glidewire, a vertebral catheter was advanced into a distal branch of the right lower lobe pulmonary artery. Limited contrast injection confirmed appropriate positioning. Over an exchange length Rosen wire, the pigtail catheter was exchanged for a 90/15 cm multi side-hole infusion catheter. A postprocedural fluoroscopic image was obtained to document final catheter positioning. Both vascular sheath were secured at the left groin. The external catheter tubing was secured at the  right thigh and the lytic therapy was initiated. The patient tolerated the procedure well without immediate postprocedural complication. FINDINGS: Central pulmonary arteriogram demonstrates significant near occlusive thrombus involving the peripheral aspects of both the right and left main pulmonary arteries with marked dilatation of the central pulmonary arterial system. Additionally, limited right main pulmonary arteriogram demonstrates near occlusive thrombus within the peripheral aspect of the right main pulmonary artery. Acquired pressure measurements: Main  pulmonary artery-61/16; mean-40 (normal: < 25/10) Following the procedure, both ultrasound assisted infusion catheter tips terminate within the distal aspects of the bilateral lower lobe sub segmental pulmonary arteries. IMPRESSION: 1. Successful fluoroscopic guided initiation of bilateral ultrasound assisted catheter directed pulmonary arterial lysis for sub massive pulmonary embolism and right-sided heart strain. 2. Elevated pressure measurements within the main pulmonary artery compatible with critical pulmonary arterial hypertension. 3. Near occlusive DVT noted within the right common femoral vein. PLAN: Above findings discussed with Dr. Hart Robinsons (critical care) at the time of procedure completion and the decision made to proceed with IVC filter placement for temporary caval interruption purposes at the time of post lysis pressure measurement acquisition.  Electronically Signed   By: Sandi Mariscal M.D.   On: 08/31/2019 08:24   IR IVC FILTER PLMT / S&I Burke Keels GUID/MOD SED  Result Date: 08/31/2019 CLINICAL DATA:  Bilateral submassive pulmonary embolism with cor pulmonale. Status post thrombo lytic infusion via each pulmonary artery for 12 hours. Request has also been made to place an IVC filter on completion thrombo lytic therapy given residual DVT in both lower extremities. EXAM: 1. FINAL DAY ARTERIAL THROMBOLYTIC THERAPY PRESSURE MEASUREMENT. 2. IVC VENOGRAM. 3. PERCUTANEOUS IVC FILTER PLACEMENT. ANESTHESIA/SEDATION: 50 mcg IV Fentanyl. The patient was just given fentanyl and not formally sedated. CONTRAST:  65m OMNIPAQUE IOHEXOL 300 MG/ML SOLN, 163mOMNIPAQUE IOHEXOL 300 MG/ML SOLN, 1035mMNIPAQUE IOHEXOL 300 MG/ML SOLN FLUOROSCOPY TIME:  1 minutes and 30 seconds.  146.6 mGy. PROCEDURE: The procedure, risks, benefits, and alternatives were explained to the patient. Questions regarding the procedure were encouraged and answered. The patient understands and consents to the procedure. A time-out was performed prior to initiating the procedure. Pulmonary artery pressures were directly measured through the pulmonary arterial infusion catheters. The catheters were then removed. Left femoral vein sheaths placed yesterday were prepped and draped. Local anesthesia was provided with 1% Lidocaine. A guidewire was advanced into the inferior vena cava via one of the sheaths. This sheath was removed and the venotomy dilated. A 10 French deployment sheath was advanced over the guidewire. This was utilized to perform IVC venography. The deployment sheath was further positioned in an appropriate location for filter deployment. A Bard Denali IVC filter was then advanced in the sheath. This was then fully deployed in the infrarenal IVC. Final filter position was confirmed with a fluoroscopic spot image. After the procedure the sheath was removed and hemostasis obtained with manual  compression. COMPLICATIONS: None. FINDINGS: Measure pulmonary artery pressure is 55/20, mean 28 mm Hg compared to 61/16, 40 mm Hg prior to lytic therapy. IVC venography demonstrates a normal caliber IVC with no evidence of thrombus. Renal veins are identified bilaterally. The IVC filter was successfully positioned below the level of the renal veins and is appropriately oriented. This IVC filter has both permanent and retrievable indications. IMPRESSION: 1. Decrease in measured pulmonary artery pressure after 12 hours of bilateral pulmonary arterial thrombolytic therapy. 2. Placement of percutaneous IVC filter in infrarenal IVC. IVC venogram shows no evidence of IVC thrombus and normal caliber of  the inferior vena cava. This filter does have both permanent and retrievable indications. PLAN: This IVC filter is potentially retrievable. The patient will be assessed for filter retrieval by Interventional Radiology in approximately 8-12 weeks. Further recommendations regarding filter retrieval, continued surveillance or declaration of device permanence, will be made at that time. Electronically Signed   By: Aletta Edouard M.D.   On: 08/31/2019 14:25   IR US Guide Vasc Access Left  Result Date: 08/31/2019 INDICATION: Sub massive pulmonary embolism. Request made for initiation of bilateral catheter directed pulmonary arterial thrombolysis. EXAM: 1. ULTRASOUND GUIDANCE FOR VENOUS ACCESS X2 2. PULMONARY ARTERIOGRAPHY 3. FLUOROSCOPIC GUIDED PLACEMENT OF BILATERAL PULMONARY ARTERIAL LYTIC INFUSION CATHETERS COMPARISON:  Chest CTA-earlier same day MEDICATIONS: None ANESTHESIA/SEDATION: Moderate (conscious) sedation was employed during this procedure. A total of Versed 2.5 mg and Fentanyl 100 mcg was administered intravenously. Moderate Sedation Time: 33 minutes. The patient's level of consciousness and vital signs were monitored continuously by radiology nursing throughout the procedure under my direct supervision.  CONTRAST:  11m OMNIPAQUE IOHEXOL 300 MG/ML  SOLN FLUOROSCOPY TIME:  9 minutes, 18 seconds (1453mGy) COMPLICATIONS: None immediate. TECHNIQUE: Informed written consent was obtained from the patient after a discussion of the risks, benefits and alternatives to treatment. Questions regarding the procedure were encouraged and answered. A timeout was performed prior to the initiation of the procedure. Ultrasound scanning was performed of the right groin and demonstrated near occlusive thrombus within the right common femoral vein. Sonographic evaluation the left groin demonstrates wide patency of the left common femoral vein. As such, the left common femoral vein was selected venous access. The left groin was prepped and draped in the usual sterile fashion, and a sterile drape was applied covering the operative field. Maximum barrier sterile technique with sterile gowns and gloves were used for the procedure. A timeout was performed prior to the initiation of the procedure. Local anesthesia was provided with 1% lidocaine. Under direct ultrasound guidance, the right common femoral vein was accessed with a micro puncture kit ultimately allowing placement of a 6 French, 35 cm vascular sheath. Slightly cranial to this initial access, the right common femoral was again accessed with an additional micropuncture kit ultimately allowing placement of an additional 7 French, 35 cm vascular sheath. Ultrasound images were saved for procedural documentation purposes. With the use of a stiff glidewire, a vertebral catheter was advanced into the main pulmonary artery and a limited central pulmonary arteriogram was performed. Pressure measurements were then obtained from the main pulmonary artery. The vertebral catheter was advanced into the distal branch of the left lower lobe pulmonary artery. Limited contrast injection confirmed appropriate positioning. Over an exchange length Rosen wire, the vertebral catheter was exchanged for a  90/10 cm multi side-hole infusion catheter. Again, with the use of a stiff glidewire, a vertebral catheter was advanced into a distal branch of the right lower lobe pulmonary artery. Limited contrast injection confirmed appropriate positioning. Over an exchange length Rosen wire, the pigtail catheter was exchanged for a 90/15 cm multi side-hole infusion catheter. A postprocedural fluoroscopic image was obtained to document final catheter positioning. Both vascular sheath were secured at the left groin. The external catheter tubing was secured at the right thigh and the lytic therapy was initiated. The patient tolerated the procedure well without immediate postprocedural complication. FINDINGS: Central pulmonary arteriogram demonstrates significant near occlusive thrombus involving the peripheral aspects of both the right and left main pulmonary arteries with marked dilatation of the central pulmonary arterial system.  Additionally, limited right main pulmonary arteriogram demonstrates near occlusive thrombus within the peripheral aspect of the right main pulmonary artery. Acquired pressure measurements: Main  pulmonary artery-61/16; mean-40 (normal: < 25/10) Following the procedure, both ultrasound assisted infusion catheter tips terminate within the distal aspects of the bilateral lower lobe sub segmental pulmonary arteries. IMPRESSION: 1. Successful fluoroscopic guided initiation of bilateral ultrasound assisted catheter directed pulmonary arterial lysis for sub massive pulmonary embolism and right-sided heart strain. 2. Elevated pressure measurements within the main pulmonary artery compatible with critical pulmonary arterial hypertension. 3. Near occlusive DVT noted within the right common femoral vein. PLAN: Above findings discussed with Dr. Hart Robinsons (critical care) at the time of procedure completion and the decision made to proceed with IVC filter placement for temporary caval interruption purposes at the time  of post lysis pressure measurement acquisition. Electronically Signed   By: Sandi Mariscal M.D.   On: 08/31/2019 08:24   IR US Guide Vasc Access Left  Result Date: 08/31/2019 INDICATION: Sub massive pulmonary embolism. Request made for initiation of bilateral catheter directed pulmonary arterial thrombolysis. EXAM: 1. ULTRASOUND GUIDANCE FOR VENOUS ACCESS X2 2. PULMONARY ARTERIOGRAPHY 3. FLUOROSCOPIC GUIDED PLACEMENT OF BILATERAL PULMONARY ARTERIAL LYTIC INFUSION CATHETERS COMPARISON:  Chest CTA-earlier same day MEDICATIONS: None ANESTHESIA/SEDATION: Moderate (conscious) sedation was employed during this procedure. A total of Versed 2.5 mg and Fentanyl 100 mcg was administered intravenously. Moderate Sedation Time: 33 minutes. The patient's level of consciousness and vital signs were monitored continuously by radiology nursing throughout the procedure under my direct supervision. CONTRAST:  35m OMNIPAQUE IOHEXOL 300 MG/ML  SOLN FLUOROSCOPY TIME:  9 minutes, 18 seconds (1962mGy) COMPLICATIONS: None immediate. TECHNIQUE: Informed written consent was obtained from the patient after a discussion of the risks, benefits and alternatives to treatment. Questions regarding the procedure were encouraged and answered. A timeout was performed prior to the initiation of the procedure. Ultrasound scanning was performed of the right groin and demonstrated near occlusive thrombus within the right common femoral vein. Sonographic evaluation the left groin demonstrates wide patency of the left common femoral vein. As such, the left common femoral vein was selected venous access. The left groin was prepped and draped in the usual sterile fashion, and a sterile drape was applied covering the operative field. Maximum barrier sterile technique with sterile gowns and gloves were used for the procedure. A timeout was performed prior to the initiation of the procedure. Local anesthesia was provided with 1% lidocaine. Under direct  ultrasound guidance, the right common femoral vein was accessed with a micro puncture kit ultimately allowing placement of a 6 French, 35 cm vascular sheath. Slightly cranial to this initial access, the right common femoral was again accessed with an additional micropuncture kit ultimately allowing placement of an additional 7 French, 35 cm vascular sheath. Ultrasound images were saved for procedural documentation purposes. With the use of a stiff glidewire, a vertebral catheter was advanced into the main pulmonary artery and a limited central pulmonary arteriogram was performed. Pressure measurements were then obtained from the main pulmonary artery. The vertebral catheter was advanced into the distal branch of the left lower lobe pulmonary artery. Limited contrast injection confirmed appropriate positioning. Over an exchange length Rosen wire, the vertebral catheter was exchanged for a 90/10 cm multi side-hole infusion catheter. Again, with the use of a stiff glidewire, a vertebral catheter was advanced into a distal branch of the right lower lobe pulmonary artery. Limited contrast injection confirmed appropriate positioning. Over an exchange length [employer]  the pigtail catheter was exchanged for a 90/15 cm multi side-hole infusion catheter. A postprocedural fluoroscopic image was obtained to document final catheter positioning. Both vascular sheath were secured at the left groin. The external catheter tubing was secured at the right thigh and the lytic therapy was initiated. The patient tolerated the procedure well without immediate postprocedural complication. FINDINGS: Central pulmonary arteriogram demonstrates significant near occlusive thrombus involving the peripheral aspects of both the right and left main pulmonary arteries with marked dilatation of the central pulmonary arterial system. Additionally, limited right main pulmonary arteriogram demonstrates near occlusive thrombus within the peripheral  aspect of the right main pulmonary artery. Acquired pressure measurements: Main  pulmonary artery-61/16; mean-40 (normal: < 25/10) Following the procedure, both ultrasound assisted infusion catheter tips terminate within the distal aspects of the bilateral lower lobe sub segmental pulmonary arteries. IMPRESSION: 1. Successful fluoroscopic guided initiation of bilateral ultrasound assisted catheter directed pulmonary arterial lysis for sub massive pulmonary embolism and right-sided heart strain. 2. Elevated pressure measurements within the main pulmonary artery compatible with critical pulmonary arterial hypertension. 3. Near occlusive DVT noted within the right common femoral vein. PLAN: Above findings discussed with Dr. Hart Robinsons (critical care) at the time of procedure completion and the decision made to proceed with IVC filter placement for temporary caval interruption purposes at the time of post lysis pressure measurement acquisition. Electronically Signed   By: Sandi Mariscal M.D.   On: 08/31/2019 08:24   DG CHEST PORT 1 VIEW  Result Date: 09/13/2019 CLINICAL DATA:  Respiratory failure EXAM: PORTABLE CHEST 1 VIEW COMPARISON:  September 11, 2019 FINDINGS: The heart size and mediastinal contours are within normal limits. Minimal subsegmental atelectasis seen at both lung bases. The visualized skeletal structures are unremarkable. IMPRESSION: Minimal bibasilar subsegmental atelectasis. Electronically Signed   By: Prudencio Pair M.D.   On: 09/13/2019 05:35   ECHOCARDIOGRAM COMPLETE  Result Date: 09/11/2019    ECHOCARDIOGRAM REPORT   Patient Name:   Joe Cantu Date of Exam: 09/11/2019 Medical Rec #:  [id number]     Height:       75.0 in Accession #:    [id number]    Weight:       170.4 lb Date of Birth:  Jul 25, 1950     BSA:          2.050 m Patient Age:    86 years      BP:           116/79 mmHg Patient Gender: M             HR:           77 bpm. Exam Location:  Inpatient Procedure: 2D Echo, Color Doppler and Cardiac  Doppler Indications:    I50.9* Heart failure (unspecified)  History:        Patient has prior history of Echocardiogram examinations, most                 recent 08/30/2019. CHF. Acute Massive Pulmonary Embolus on                 08/30/19.  Sonographer:    Raquel Sarna Senior RDCS Referring Phys: [id number] RIPUDEEP K RAI IMPRESSIONS  1. Left ventricular ejection fraction, by estimation, is 50 to 55%. The left ventricle has low normal function. The left ventricle has no regional wall motion abnormalities. Left ventricular diastolic parameters are consistent with Grade I diastolic dysfunction (impaired relaxation). There is the interventricular septum is flattened in diastole ('D' shaped  left ventricle), consistent with right ventricular volume overload.  2. Right ventricular systolic function is normal. The right ventricular size is moderately enlarged. There is moderately elevated pulmonary artery systolic pressure. The estimated right ventricular systolic pressure is 27.0 mmHg.  3. Right atrial size was moderately dilated.  4. The mitral valve is normal in structure and function. Trivial mitral valve regurgitation. No evidence of mitral stenosis.  5. The aortic valve is normal in structure and function. Aortic valve regurgitation is not visualized. No aortic stenosis is present.  6. Aortic dilatation noted. There is mild dilatation of the ascending aorta measuring 37 mm. FINDINGS  Left Ventricle: Left ventricular ejection fraction, by estimation, is 50 to 55%. The left ventricle has low normal function. The left ventricle has no regional wall motion abnormalities. The left ventricular internal cavity size was normal in size. There is no left ventricular hypertrophy. The interventricular septum is flattened in diastole ('D' shaped left ventricle), consistent with right ventricular volume overload. Left ventricular diastolic parameters are consistent with Grade I diastolic dysfunction (impaired relaxation). Right Ventricle: The  right ventricular size is moderately enlarged. No increase in right ventricular wall thickness. Right ventricular systolic function is normal. There is moderately elevated pulmonary artery systolic pressure. The tricuspid regurgitant  velocity is 3.48 m/s, and with an assumed right atrial pressure of 8 mmHg, the estimated right ventricular systolic pressure is 35.0 mmHg. Left Atrium: Left atrial size was normal in size. Right Atrium: Right atrial size was moderately dilated. Pericardium: There is no evidence of pericardial effusion. Mitral Valve: The mitral valve is normal in structure and function. Trivial mitral valve regurgitation. No evidence of mitral valve stenosis. Tricuspid Valve: The tricuspid valve is normal in structure. Tricuspid valve regurgitation is mild. Aortic Valve: The aortic valve is normal in structure and function. Aortic valve regurgitation is not visualized. No aortic stenosis is present. Pulmonic Valve: The pulmonic valve was normal in structure. Pulmonic valve regurgitation is trivial. Aorta: Aortic dilatation noted. There is mild dilatation of the ascending aorta measuring 37 mm. IAS/Shunts: The atrial septum is grossly normal.  LEFT VENTRICLE PLAX 2D LVIDd:         4.95 cm  Diastology LVIDs:         3.20 cm  LV e' lateral:   9.01 cm/s LV PW:         1.00 cm  LV E/e' lateral: 6.1 LV IVS:        1.02 cm  LV e' medial:    6.68 cm/s LVOT diam:     2.70 cm  LV E/e' medial:  8.3 LV SV:         85 LV SV Index:   42 LVOT Area:     5.73 cm  RIGHT VENTRICLE RV S prime:     13.70 cm/s TAPSE (M-mode): 2.2 cm LEFT ATRIUM             Index       RIGHT ATRIUM           Index LA diam:        3.10 cm 1.51 cm/m  RA Area:     26.00 cm LA Vol (A2C):   48.4 ml 23.61 ml/m RA Volume:   90.70 ml  44.24 ml/m LA Vol (A4C):   68.4 ml 33.36 ml/m LA Biplane Vol: 60.7 ml 29.61 ml/m  AORTIC VALVE LVOT Vmax:   96.60 cm/s LVOT Vmean:  61.900 cm/s LVOT VTI:    0.149 m  PULMONARY ARTERY AORTA                     MPA diam:        3.20 cm Ao Root diam: 3.90 cm Ao Asc diam:  3.70 cm MITRAL VALVE               TRICUSPID VALVE MV Area (PHT): 2.79 cm    TR Peak grad:   48.4 mmHg MV Decel Time: 272 msec    TR Vmax:        348.00 cm/s MV E velocity: 55.30 cm/s MV A velocity: 78.40 cm/s  SHUNTS MV E/A ratio:  0.71        Systemic VTI:  0.15 m                            Systemic Diam: 2.70 cm Mertie Moores MD Electronically signed by Mertie Moores MD Signature Date/Time: 09/11/2019/3:55:30 PM    Final    ECHOCARDIOGRAM COMPLETE  Result Date: 08/30/2019    ECHOCARDIOGRAM REPORT   Patient Name:   Joe Cantu Date of Exam: 08/30/2019 Medical Rec #:  [id number]     Height:       75.0 in Accession #:    [id number]    Weight:       172.0 lb Date of Birth:  [birth year]     BSA:          2.06 m Patient Age:    69 years      BP:           122/87 mmHg Patient Gender: M             HR:           98 bpm. Exam Location:  Inpatient Procedure: 2D Echo, Cardiac Doppler and Color Doppler STAT ECHO Indications:    R55 Syncope  History:        Patient has no prior history of Echocardiogram examinations.                 Aortic Rood Dilatation.  Sonographer:    Jonelle Sidle Dance Referring Phys: [id number] [place]  1. Severe RA/RV dilation with reduced RV function and severe pulmonary hypertension. LV hyperdynamic and borderline small cavity/underfilled. No hemodynamically significant valve disease.  2. Left ventricular ejection fraction, by estimation, is 65 to 70%. The left ventricle has hyperdynamic function. The left ventricle has no regional wall motion abnormalities. There is moderate concentric left ventricular hypertrophy. Left ventricular diastolic function could not be evaluated. There is the interventricular septum is flattened in systole and diastole, consistent with right ventricular pressure and volume overload.  3. Right ventricular systolic function is moderately reduced. The right ventricular size  is severely enlarged. There is severely elevated pulmonary artery systolic pressure. The estimated right ventricular systolic pressure is 23.9 mmHg.  4. Right atrial size was severely dilated.  5. The mitral valve is normal in structure and function. Trivial mitral valve regurgitation. No evidence of mitral stenosis.  6. Tricuspid valve regurgitation is moderate.  7. The aortic valve is tricuspid. Aortic valve regurgitation is trivial. No aortic stenosis is present.  8. Aortic dilatation noted. There is mild dilatation of the ascending aorta measuring 37 mm.  9. Mildly dilated pulmonary artery. 10. The inferior vena cava is dilated in size with <50% respiratory variability, suggesting right atrial pressure of 15 mmHg. 11. Cannot exclude IAS shunt. Comparison(s):  No prior Echocardiogram. FINDINGS  Left Ventricle: Left ventricular ejection fraction, by estimation, is 65 to 70%. The left ventricle has hyperdynamic function. The left ventricle has no regional wall motion abnormalities. The left ventricular internal cavity size was small. There is moderate concentric left ventricular hypertrophy. The interventricular septum is flattened in systole and diastole, consistent with right ventricular pressure and volume overload. Left ventricular diastolic function could not be evaluated. Right Ventricle: The right ventricular size is severely enlarged. Right vetricular wall thickness was not assessed. Right ventricular systolic function is moderately reduced. There is severely elevated pulmonary artery systolic pressure. The tricuspid regurgitant velocity is 3.91 m/s, and with an assumed right atrial pressure of 15 mmHg, the estimated right ventricular systolic pressure is 67.3 mmHg. Left Atrium: Left atrial size was normal in size. Right Atrium: Right atrial size was severely dilated. Pericardium: There is no evidence of pericardial effusion. Mitral Valve: The mitral valve is normal in structure and function. Trivial mitral  valve regurgitation. No evidence of mitral valve stenosis. Tricuspid Valve: The tricuspid valve is normal in structure. Tricuspid valve regurgitation is moderate . No evidence of tricuspid stenosis. Aortic Valve: The aortic valve is tricuspid. Aortic valve regurgitation is trivial. No aortic stenosis is present. Pulmonic Valve: The pulmonic valve was grossly normal. Pulmonic valve regurgitation is mild. No evidence of pulmonic stenosis. Aorta: Aortic dilatation noted. There is mild dilatation of the ascending aorta measuring 37 mm. Pulmonary Artery: The pulmonary artery is mildly dilated. Venous: The inferior vena cava is dilated in size with less than 50% respiratory variability, suggesting right atrial pressure of 15 mmHg. IAS/Shunts: Cannot exclude IAS shunt. Additional Comments: There is a small pleural effusion in both left and right lateral regions.  LEFT VENTRICLE PLAX 2D LVIDd:         3.33 cm LVIDs:         2.20 cm LV PW:         1.75 cm LV IVS:        2.02 cm LVOT diam:     2.40 cm LV SV:         72.61 ml LV SV Index:   14.28 LVOT Area:     4.52 cm  RIGHT VENTRICLE          IVC RV Basal diam:  4.20 cm  IVC diam: 2.20 cm RV Mid diam:    3.00 cm TAPSE (M-mode): 1.4 cm LEFT ATRIUM             Index       RIGHT ATRIUM           Index LA diam:        3.40 cm 1.65 cm/m  RA Area:     39.50 cm LA Vol (A2C):   42.1 ml 20.46 ml/m RA Volume:   182.50 ml 88.67 ml/m LA Vol (A4C):   43.0 ml 20.89 ml/m LA Biplane Vol: 42.4 ml 20.60 ml/m  AORTIC VALVE LVOT Vmax:   102.40 cm/s LVOT Vmean:  57.500 cm/s LVOT VTI:    0.160 m  AORTA Ao Root diam: 4.20 cm Ao Asc diam:  3.70 cm MV A velocity: 78.45 cm/s  TRICUSPID VALVE                            TR Peak grad:   61.2 mmHg  TR Vmax:        391.00 cm/s                             SHUNTS                            Systemic VTI:  0.16 m                            Systemic Diam: 2.40 cm Buford Dresser MD Electronically signed by Buford Dresser MD Signature Date/Time: 08/30/2019/12:48:37 PM    Final    VAS [place] LOWER EXTREMITY VENOUS (DVT)  Result Date: 08/30/2019  Lower Venous DVTStudy Indications: Pulmonary embolism, and SOB.  Comparison Study: No prior exam. Performing Technologist: Baldwin Crown ARDMS, RVT  Examination Guidelines: A complete evaluation includes B-mode imaging, spectral Doppler, color Doppler, and power Doppler as needed of all accessible portions of each vessel. Bilateral testing is considered an integral part of a complete examination. Limited examinations for reoccurring indications may be performed as noted. The reflux portion of the exam is performed with the patient in reverse Trendelenburg.  +---------+---------------+---------+-----------+----------+---------------+ RIGHT    CompressibilityPhasicitySpontaneityPropertiesThrombus Aging  +---------+---------------+---------+-----------+----------+---------------+ CFV      Partial        Yes      Yes                                  +---------+---------------+---------+-----------+----------+---------------+ SFJ      None                                                         +---------+---------------+---------+-----------+----------+---------------+ FV Prox  Full                                                         +---------+---------------+---------+-----------+----------+---------------+ FV Mid   Full                                                         +---------+---------------+---------+-----------+----------+---------------+ FV DistalFull                                                         +---------+---------------+---------+-----------+----------+---------------+ PFV      Full                                                         +---------+---------------+---------+-----------+----------+---------------+ POP      Full  Yes      Yes                                   +---------+---------------+---------+-----------+----------+---------------+ PTV      Full                                         seen with color +---------+---------------+---------+-----------+----------+---------------+ PERO     Full                                         seen with color +---------+---------------+---------+-----------+----------+---------------+ EIV      Full           Yes      Yes                                  +---------+---------------+---------+-----------+----------+---------------+   +---------+---------------+---------+-----------+----------+---------------+ LEFT     CompressibilityPhasicitySpontaneityPropertiesThrombus Aging  +---------+---------------+---------+-----------+----------+---------------+ CFV      None           No       No                                   +---------+---------------+---------+-----------+----------+---------------+ SFJ      Full                                                         +---------+---------------+---------+-----------+----------+---------------+ FV Prox  Full                                                         +---------+---------------+---------+-----------+----------+---------------+ FV Mid   Full                                                         +---------+---------------+---------+-----------+----------+---------------+ FV DistalNone                                                         +---------+---------------+---------+-----------+----------+---------------+ PFV      Full                                                         +---------+---------------+---------+-----------+----------+---------------+ POP      None  No       No                                   +---------+---------------+---------+-----------+----------+---------------+ PTV      Full                                         seen with color  +---------+---------------+---------+-----------+----------+---------------+ PERO     Full                                         seen with color +---------+---------------+---------+-----------+----------+---------------+ EIV      Full                                                         +---------+---------------+---------+-----------+----------+---------------+ Poorly visusalized bilteral calf veins, visualized with color flow.    Summary: RIGHT: - Findings consistent with acute deep vein thrombosis involving the SF junction, and right common femoral vein. - Findings consistent with acute superficial vein thrombosis involving the right great saphenous vein.  LEFT: - Findings consistent with acute deep vein thrombosis involving the left common femoral vein, left femoral vein, and left popliteal vein.  *See table(s) above for measurements and observations. Electronically signed by Harold Barban MD on 08/30/2019 at 6:38:23 PM.    Final    US Abdomen Limited RUQ  Result Date: 09/10/2019 CLINICAL DATA:  Transaminitis EXAM: ULTRASOUND ABDOMEN LIMITED RIGHT UPPER QUADRANT COMPARISON:  None. FINDINGS: Gallbladder: No gallstones or wall thickening visualized. No sonographic Murphy sign noted by sonographer. Common bile duct: Diameter: 3.5 mm Liver: No focal lesion identified. Within normal limits in parenchymal echogenicity. Portal vein is patent on color Doppler imaging with normal direction of blood flow towards the liver. Other: None. IMPRESSION: Normal study. Electronically Signed   By: Constance Holster M.D.   On: 09/10/2019 19:29   IR INFUSION THROMBOL ARTERIAL INITIAL (MS)  Result Date: 08/31/2019 INDICATION: Sub massive pulmonary embolism. Request made for initiation of bilateral catheter directed pulmonary arterial thrombolysis. EXAM: 1. ULTRASOUND GUIDANCE FOR VENOUS ACCESS X2 2. PULMONARY ARTERIOGRAPHY 3. FLUOROSCOPIC GUIDED PLACEMENT OF BILATERAL PULMONARY ARTERIAL LYTIC INFUSION  CATHETERS COMPARISON:  Chest CTA-earlier same day MEDICATIONS: None ANESTHESIA/SEDATION: Moderate (conscious) sedation was employed during this procedure. A total of Versed 2.5 mg and Fentanyl 100 mcg was administered intravenously. Moderate Sedation Time: 33 minutes. The patient's level of consciousness and vital signs were monitored continuously by radiology nursing throughout the procedure under my direct supervision. CONTRAST:  67m OMNIPAQUE IOHEXOL 300 MG/ML  SOLN FLUOROSCOPY TIME:  9 minutes, 18 seconds (1761mGy) COMPLICATIONS: None immediate. TECHNIQUE: Informed written consent was obtained from the patient after a discussion of the risks, benefits and alternatives to treatment. Questions regarding the procedure were encouraged and answered. A timeout was performed prior to the initiation of the procedure. Ultrasound scanning was performed of the right groin and demonstrated near occlusive thrombus within the right common femoral vein. Sonographic evaluation the left groin demonstrates wide patency of the left common femoral vein. As such, the left common femoral vein  was selected venous access. The left groin was prepped and draped in the usual sterile fashion, and a sterile drape was applied covering the operative field. Maximum barrier sterile technique with sterile gowns and gloves were used for the procedure. A timeout was performed prior to the initiation of the procedure. Local anesthesia was provided with 1% lidocaine. Under direct ultrasound guidance, the right common femoral vein was accessed with a micro puncture kit ultimately allowing placement of a 6 French, 35 cm vascular sheath. Slightly cranial to this initial access, the right common femoral was again accessed with an additional micropuncture kit ultimately allowing placement of an additional 7 French, 35 cm vascular sheath. Ultrasound images were saved for procedural documentation purposes. With the use of a stiff glidewire, a vertebral  catheter was advanced into the main pulmonary artery and a limited central pulmonary arteriogram was performed. Pressure measurements were then obtained from the main pulmonary artery. The vertebral catheter was advanced into the distal branch of the left lower lobe pulmonary artery. Limited contrast injection confirmed appropriate positioning. Over an exchange length Rosen wire, the vertebral catheter was exchanged for a 90/10 cm multi side-hole infusion catheter. Again, with the use of a stiff glidewire, a vertebral catheter was advanced into a distal branch of the right lower lobe pulmonary artery. Limited contrast injection confirmed appropriate positioning. Over an exchange length Rosen wire, the pigtail catheter was exchanged for a 90/15 cm multi side-hole infusion catheter. A postprocedural fluoroscopic image was obtained to document final catheter positioning. Both vascular sheath were secured at the left groin. The external catheter tubing was secured at the right thigh and the lytic therapy was initiated. The patient tolerated the procedure well without immediate postprocedural complication. FINDINGS: Central pulmonary arteriogram demonstrates significant near occlusive thrombus involving the peripheral aspects of both the right and left main pulmonary arteries with marked dilatation of the central pulmonary arterial system. Additionally, limited right main pulmonary arteriogram demonstrates near occlusive thrombus within the peripheral aspect of the right main pulmonary artery. Acquired pressure measurements: Main  pulmonary artery-61/16; mean-40 (normal: < 25/10) Following the procedure, both ultrasound assisted infusion catheter tips terminate within the distal aspects of the bilateral lower lobe sub segmental pulmonary arteries. IMPRESSION: 1. Successful fluoroscopic guided initiation of bilateral ultrasound assisted catheter directed pulmonary arterial lysis for sub massive pulmonary embolism and  right-sided heart strain. 2. Elevated pressure measurements within the main pulmonary artery compatible with critical pulmonary arterial hypertension. 3. Near occlusive DVT noted within the right common femoral vein. PLAN: Above findings discussed with Dr. Hart Robinsons (critical care) at the time of procedure completion and the decision made to proceed with IVC filter placement for temporary caval interruption purposes at the time of post lysis pressure measurement acquisition. Electronically Signed   By: Sandi Mariscal M.D.   On: 08/31/2019 08:24   IR INFUSION THROMBOL ARTERIAL INITIAL (MS)  Result Date: 08/31/2019 INDICATION: Sub massive pulmonary embolism. Request made for initiation of bilateral catheter directed pulmonary arterial thrombolysis. EXAM: 1. ULTRASOUND GUIDANCE FOR VENOUS ACCESS X2 2. PULMONARY ARTERIOGRAPHY 3. FLUOROSCOPIC GUIDED PLACEMENT OF BILATERAL PULMONARY ARTERIAL LYTIC INFUSION CATHETERS COMPARISON:  Chest CTA-earlier same day MEDICATIONS: None ANESTHESIA/SEDATION: Moderate (conscious) sedation was employed during this procedure. A total of Versed 2.5 mg and Fentanyl 100 mcg was administered intravenously. Moderate Sedation Time: 33 minutes. The patient's level of consciousness and vital signs were monitored continuously by radiology nursing throughout the procedure under my direct supervision. CONTRAST:  45m OMNIPAQUE IOHEXOL 300 MG/ML  SOLN FLUOROSCOPY  TIME:  9 minutes, 18 seconds (010 mGy) COMPLICATIONS: None immediate. TECHNIQUE: Informed written consent was obtained from the patient after a discussion of the risks, benefits and alternatives to treatment. Questions regarding the procedure were encouraged and answered. A timeout was performed prior to the initiation of the procedure. Ultrasound scanning was performed of the right groin and demonstrated near occlusive thrombus within the right common femoral vein. Sonographic evaluation the left groin demonstrates wide patency of the left  common femoral vein. As such, the left common femoral vein was selected venous access. The left groin was prepped and draped in the usual sterile fashion, and a sterile drape was applied covering the operative field. Maximum barrier sterile technique with sterile gowns and gloves were used for the procedure. A timeout was performed prior to the initiation of the procedure. Local anesthesia was provided with 1% lidocaine. Under direct ultrasound guidance, the right common femoral vein was accessed with a micro puncture kit ultimately allowing placement of a 6 French, 35 cm vascular sheath. Slightly cranial to this initial access, the right common femoral was again accessed with an additional micropuncture kit ultimately allowing placement of an additional 7 French, 35 cm vascular sheath. Ultrasound images were saved for procedural documentation purposes. With the use of a stiff glidewire, a vertebral catheter was advanced into the main pulmonary artery and a limited central pulmonary arteriogram was performed. Pressure measurements were then obtained from the main pulmonary artery. The vertebral catheter was advanced into the distal branch of the left lower lobe pulmonary artery. Limited contrast injection confirmed appropriate positioning. Over an exchange length Rosen wire, the vertebral catheter was exchanged for a 90/10 cm multi side-hole infusion catheter. Again, with the use of a stiff glidewire, a vertebral catheter was advanced into a distal branch of the right lower lobe pulmonary artery. Limited contrast injection confirmed appropriate positioning. Over an exchange length Rosen wire, the pigtail catheter was exchanged for a 90/15 cm multi side-hole infusion catheter. A postprocedural fluoroscopic image was obtained to document final catheter positioning. Both vascular sheath were secured at the left groin. The external catheter tubing was secured at the right thigh and the lytic therapy was initiated. The  patient tolerated the procedure well without immediate postprocedural complication. FINDINGS: Central pulmonary arteriogram demonstrates significant near occlusive thrombus involving the peripheral aspects of both the right and left main pulmonary arteries with marked dilatation of the central pulmonary arterial system. Additionally, limited right main pulmonary arteriogram demonstrates near occlusive thrombus within the peripheral aspect of the right main pulmonary artery. Acquired pressure measurements: Main  pulmonary artery-61/16; mean-40 (normal: < 25/10) Following the procedure, both ultrasound assisted infusion catheter tips terminate within the distal aspects of the bilateral lower lobe sub segmental pulmonary arteries. IMPRESSION: 1. Successful fluoroscopic guided initiation of bilateral ultrasound assisted catheter directed pulmonary arterial lysis for sub massive pulmonary embolism and right-sided heart strain. 2. Elevated pressure measurements within the main pulmonary artery compatible with critical pulmonary arterial hypertension. 3. Near occlusive DVT noted within the right common femoral vein. PLAN: Above findings discussed with Dr. Hart Robinsons (critical care) at the time of procedure completion and the decision made to proceed with IVC filter placement for temporary caval interruption purposes at the time of post lysis pressure measurement acquisition. Electronically Signed   By: Sandi Mariscal M.D.   On: 08/31/2019 08:24   IR THROMB F/U EVAL ART/VEN FINAL DAY (MS)  Result Date: 08/31/2019 CLINICAL DATA:  Bilateral submassive pulmonary embolism with cor pulmonale. Status post  thrombo lytic infusion via each pulmonary artery for 12 hours. Request has also been made to place an IVC filter on completion thrombo lytic therapy given residual DVT in both lower extremities. EXAM: 1. FINAL DAY ARTERIAL THROMBOLYTIC THERAPY PRESSURE MEASUREMENT. 2. IVC VENOGRAM. 3. PERCUTANEOUS IVC FILTER PLACEMENT.  ANESTHESIA/SEDATION: 50 mcg IV Fentanyl. The patient was just given fentanyl and not formally sedated. CONTRAST:  46m OMNIPAQUE IOHEXOL 300 MG/ML SOLN, 133mOMNIPAQUE IOHEXOL 300 MG/ML SOLN, 1079mMNIPAQUE IOHEXOL 300 MG/ML SOLN FLUOROSCOPY TIME:  1 minutes and 30 seconds.  146.6 mGy. PROCEDURE: The procedure, risks, benefits, and alternatives were explained to the patient. Questions regarding the procedure were encouraged and answered. The patient understands and consents to the procedure. A time-out was performed prior to initiating the procedure. Pulmonary artery pressures were directly measured through the pulmonary arterial infusion catheters. The catheters were then removed. Left femoral vein sheaths placed yesterday were prepped and draped. Local anesthesia was provided with 1% Lidocaine. A guidewire was advanced into the inferior vena cava via one of the sheaths. This sheath was removed and the venotomy dilated. A 10 French deployment sheath was advanced over the guidewire. This was utilized to perform IVC venography. The deployment sheath was further positioned in an appropriate location for filter deployment. A Bard Denali IVC filter was then advanced in the sheath. This was then fully deployed in the infrarenal IVC. Final filter position was confirmed with a fluoroscopic spot image. After the procedure the sheath was removed and hemostasis obtained with manual compression. COMPLICATIONS: None. FINDINGS: Measure pulmonary artery pressure is 55/20, mean 28 mm Hg compared to 61/16, 40 mm Hg prior to lytic therapy. IVC venography demonstrates a normal caliber IVC with no evidence of thrombus. Renal veins are identified bilaterally. The IVC filter was successfully positioned below the level of the renal veins and is appropriately oriented. This IVC filter has both permanent and retrievable indications. IMPRESSION: 1. Decrease in measured pulmonary artery pressure after 12 hours of bilateral pulmonary arterial  thrombolytic therapy. 2. Placement of percutaneous IVC filter in infrarenal IVC. IVC venogram shows no evidence of IVC thrombus and normal caliber of the inferior vena cava. This filter does have both permanent and retrievable indications. PLAN: This IVC filter is potentially retrievable. The patient will be assessed for filter retrieval by Interventional Radiology in approximately 8-12 weeks. Further recommendations regarding filter retrieval, continued surveillance or declaration of device permanence, will be made at that time. Electronically Signed   By: GleAletta EdouardD.   On: 08/31/2019 14:25     Assessment: [age]o. [place] man with unprovoked DVT and PE, on Rivaroxaban.  1. Unprovoked DVT and PE on 08/30/2019             (a) s/p catheter directed TPA 2/17 and removable IVC filter placement 08/31/2019             (b) Rivaroxaban BID at 15 mg             (c) hypercoagulable panel negative  2. Hemoptysis 09/10/2019 and subsequent             (a) Echo 09/10/2020, EF 50-55%              (b) Bronchoscopy on 09/12/2019 shows no active bleeding or etiology             (c) EGD 09/15/2019 pending   (d) Rivaroxaban at 61m42mily, to decrease to 10mg51mer 30 days  3. Positive blood and urine culture: E  Coli on 2/28-pan sensitive             4. Deconditioning             (a) PT evaluation pending    Plan:  For EGD today.  Still somewhat deconditioned.  Still having some hemoptysis or hematemesis although volume seems to have decreased considerably.  Otherwise tolerating rivaroxaban well at current dose  If he is still in the hospital Monday I will see him again otherwise he has an appointment with me 09/22/2019 at [facility] for follow-up of his coagulopathy.   Chauncey Cruel, MD 09/15/2019  12:11 PM Medical Oncology and Hematology [facility] [address] [place], [place] [address] Tel. [phone]    Fax. [phone]

## 2019-09-15 NOTE — Anesthesia Preprocedure Evaluation (Signed)
Anesthesia Evaluation  Patient identified by MRN, date of birth, ID band Patient awake    Reviewed: Allergy & Precautions, NPO status , Patient's Chart, lab work & pertinent test results, reviewed documented beta blocker date and time   Airway Mallampati: I       Dental no notable dental hx. (+) Teeth Intact   Pulmonary former smoker,    Pulmonary exam normal breath sounds clear to auscultation       Cardiovascular hypertension, Pt. on home beta blockers Normal cardiovascular exam Rhythm:Regular Rate:Normal     Neuro/Psych negative neurological ROS  negative psych ROS   GI/Hepatic Neg liver ROS,   Endo/Other    Renal/GU negative Renal ROS  negative genitourinary   Musculoskeletal negative musculoskeletal ROS (+)   Abdominal Normal abdominal exam  (+)   Peds  Hematology  (+) Blood dyscrasia, anemia ,   Anesthesia Other Findings   Reproductive/Obstetrics                             Anesthesia Physical Anesthesia Plan  ASA: II  Anesthesia Plan: MAC   Post-op Pain Management:    Induction:   PONV Risk Score and Plan: 1 and Propofol infusion  Airway Management Planned: Natural Airway and Mask  Additional Equipment: None  Intra-op Plan:   Post-operative Plan:   Informed Consent: I have reviewed the patients History and Physical, chart, labs and discussed the procedure including the risks, benefits and alternatives for the proposed anesthesia with the patient or authorized representative who has indicated his/her understanding and acceptance.     Dental advisory given  Plan Discussed with: CRNA  Anesthesia Plan Comments:         Anesthesia Quick Evaluation

## 2019-09-15 NOTE — Anesthesia Postprocedure Evaluation (Signed)
Anesthesia Post Note  Patient: Joe Cantu  Procedure(s) Performed: ESOPHAGOGASTRODUODENOSCOPY (EGD) WITH PROPOFOL (N/A )     Patient location during evaluation: Endoscopy Anesthesia Type: MAC Level of consciousness: awake and sedated Pain management: pain level controlled Vital Signs Assessment: post-procedure vital signs reviewed and stable Respiratory status: spontaneous breathing Cardiovascular status: stable Postop Assessment: no apparent nausea or vomiting Anesthetic complications: no    Last Vitals:  Vitals:   09/15/19 1320 09/15/19 1330  BP: 98/75 117/70  Pulse: 83 75  Resp: (!) (P) 21 19  Temp:    SpO2: 94% 97%    Last Pain:  Vitals:   09/15/19 1320  TempSrc:   PainSc: (P) 0-No pain   Pain Goal:                   [name]

## 2019-09-16 LAB — CBC
HCT: 31.6 % — ABNORMAL LOW (ref 39.0–52.0)
Hemoglobin: 10.1 g/dL — ABNORMAL LOW (ref 13.0–17.0)
MCH: 30.2 pg (ref 26.0–34.0)
MCHC: 32 g/dL (ref 30.0–36.0)
MCV: 94.6 fL (ref 80.0–100.0)
Platelets: 453 10*3/uL — ABNORMAL HIGH (ref 150–400)
RBC: 3.34 MIL/uL — ABNORMAL LOW (ref 4.22–5.81)
RDW: 13.7 % (ref 11.5–15.5)
WBC: 7.4 10*3/uL (ref 4.0–10.5)
nRBC: 0 % (ref 0.0–0.2)

## 2019-09-16 MED ORDER — RIVAROXABAN 15 MG PO TABS
15.0000 mg | ORAL_TABLET | Freq: Every day | ORAL | Status: DC
  Administered 2019-09-16 – 2019-09-18 (×3): 15 mg via ORAL
  Filled 2019-09-16 (×3): qty 1

## 2019-09-16 NOTE — Progress Notes (Signed)
PROGRESS NOTE    Joe Cantu  VVO:[id number] DOB: 1951/04/14 DOA: 09/10/2019 PCP: Iona Beard, MD   Brief Narrative: Joe Cantu is a 69 y.o. male with a history of frequent PVCs, massive PE, left lower extremity DVT s/p IVC filter, factor v Leiden (heterozygous). Patient presented secondary to hemoptysis in setting of Xarelto use for PE. Bronchoscopy performed which was not significant for acute bleed. Xarelto restarted and patient has recurrent hemoptysis. Hemoglobin appears to be stable at this time.   Assessment & Plan:   Principal Problem:   Hemoptysis Active Problems:   Aortic root dilatation (HCC)   Frequent unifocal PVCs   Acute massive pulmonary embolism (HCC)   DVT (deep venous thrombosis) (HCC)   Transaminitis   Hemoptysis In setting of recent initiation of anticoagulation and coughing spells. Xarelto initially held and pulmonology consulted. No recurrent hemoptysis and hemoglobin has been stable. Pulmonology performed a bronchoscopy on 3/2 which was significant for no bleeding source. Oncology was consulted for recommendations on Xarelto dose in setting of acute PE (massive) in addition to extensive LE DVT s/p IVC filter and recommendation to decrease dose to Xarelto 15 mg daily to complete currently available prescription followed by reduction to Xarelto 10 mg. Patient with recurrent ?hemoptysis. Discussed with pulmonology with concern this may not be originating from airway; recommending GI consult. GI planning EGD today. BAL without malignant cells. Hemoglobin is stable in setting of continued hemoptysis. PT recommending SNF however patient is declining. SLP recommending dysphagia 3 diet with thin liquids. -Continue Xarelto 15 mg daily -OT eval pending -CBC in AM  History of massive PE History of left LE DVT Factor V Leiden, heterozygous Patient was on Xarelto 15 mg BID prior to admission. IVC filter also placed prior to admission. Patient's Xarelto dose has been  adjusted as mentioned above to Xarelto 15 mg daily followed by Xarelto 10 mg daily. Patient has follow up with hematology scheduled.  Acute transaminitis In setting of massive PE and RV dysfunction. Improved.  Frequent PVCs -Continue metoprolol  Prolonged QTc Noted.  Hypokalemia Resolved.   DVT prophylaxis: Xarelto Code Status:   Code Status: Full Code Family Communication: None at bedside Disposition Plan: Discharge pending continued workup for etiology vs resolution of hemoptysis   Consultants:   Pulmonology  Hematology  Procedures:   BRONCHOSCOPY (3/2) Impression:      - Hemoptysis      - The airway examination did not show endbronchial abnormalities or       active bleeding. Blood tinged mucus and old blood noted in the bilateral       airways.      - Bronchoalveolar lavage was performed.   UPPER GI  ENDOSCOPY (3/5) Impression:               - Normal esophagus.                           - Normal stomach.                           - Normal examined duodenum.                           - No specimens collected.  Recommendation:           - Return patient to hospital ward for ongoing care.                           -  Resume regular diet.                           - Continue present medications.                           - Management per Pulmonary.                           - No further GI evaluation at this time. Signing                            off.  Antimicrobials:  None    Subjective: No hemoptysis overnight.  Objective: Vitals:   09/15/19 1330 09/15/19 1404 09/16/19 0611 09/16/19 1308  BP: 117/70 (!) 121/94 136/80 114/82  Pulse: 75 79 79 83  Resp: _0 Temp:  98.3 F (36.8 C) 98.6 F (37 C) 97.6 F (36.4 C)  TempSrc:  Oral Oral Oral  SpO2: 97% 100% 99% 99%  Weight:      Height:        Intake/Output Summary (Last 24 hours) at 09/16/2019 1405 Last data filed at 09/16/2019 1030 Gross per 24 hour  Intake --  Output 1925 ml  Net  -1925 ml   Filed Weights   09/10/19 2054 09/12/19 0846 09/15/19 1115  Weight: 77.3 kg 77.3 kg 77.3 kg    Examination:  General exam: Appears calm and comfortable Respiratory system: Clear to auscultation. Respiratory effort normal. Cardiovascular system: S1 & S2 heard, RRR. No murmurs, rubs, gallops or clicks. Gastrointestinal system: Abdomen is nondistended, soft and nontender. No organomegaly or masses felt. Normal bowel sounds heard. Central nervous system: Alert and oriented. No focal neurological deficits. Extremities: No edema. No calf tenderness Skin: No cyanosis. No rashes Psychiatry: Judgement and insight appear normal. Mood & affect appropriate.    Data Reviewed: I have personally reviewed following labs and imaging studies  CBC: Recent Labs  Lab 09/10/19 1542 09/11/19 0317 09/12/19 0148 09/12/19 0148 09/13/19 0255 09/13/19 1453 09/14/19 0514 09/15/19 0838 09/16/19 0532  WBC 14.2*   < > 13.3*  --  12.2*  --  7.3 7.3 7.4  NEUTROABS 12.3*  --   --   --   --   --   --   --   --   HGB 10.4*   < > 10.4*   < > 9.8* 10.7* 10.6* 10.8* 10.1*  HCT 31.8*   < > 32.4*   < > 31.2* 33.5* 32.4* 32.8* 31.6*  MCV 93.8   < > 95.9  --  95.4  --  94.7 94.5 94.6  PLT 312   < > 325  --  308  --  358 420* 453*   < > = values in this interval not displayed.   Basic Metabolic Panel: Recent Labs  Lab 09/10/19 1542 09/10/19 2026 09/11/19 0317 09/12/19 0148 09/13/19 0255 09/14/19 0514  NA 132*  --  139 138 135 134*  K 3.3*  --  4.7 3.8 3.4* 3.9  CL 101  --  105 107 107 106  CO2 23  --  24 21* 19* 19*  GLUCOSE 124*  --  110* 113* 124* 101*  BUN 17  --  _1 CREATININE 0.92  --  0.88 0.83 0.76 0.72  CALCIUM 8.8*  --  8.9 9.3 8.8* 9.4  MG  --  1.9  --   --   --   --    GFR: Estimated Creatinine Clearance: 99.3 mL/min (by C-G formula based on SCr of 0.72 mg/dL). Liver Function Tests: Recent Labs  Lab 09/10/19 1542 09/11/19 0317 09/12/19 0643  AST 134* 70* 41  ALT  110* 89* 69*  ALKPHOS 211* 173* 152*  BILITOT 1.3* 1.3* 1.8*  PROT 6.5 6.0* 5.8*  ALBUMIN 2.3* 2.2* 2.0*   Recent Labs  Lab 09/10/19 1542  LIPASE 31   No results for input(s): AMMONIA in the last 168 hours. Coagulation Profile: Recent Labs  Lab 09/10/19 1542 09/12/19 0643  INR 2.7* 1.3*   Cardiac Enzymes: No results for input(s): CKTOTAL, CKMB, CKMBINDEX, TROPONINI in the last 168 hours. BNP (last 3 results) No results for input(s): PROBNP in the last 8760 hours. HbA1C: No results for input(s): HGBA1C in the last 72 hours. CBG: No results for input(s): GLUCAP in the last 168 hours. Lipid Profile: No results for input(s): CHOL, HDL, LDLCALC, TRIG, CHOLHDL, LDLDIRECT in the last 72 hours. Thyroid Function Tests: No results for input(s): TSH, T4TOTAL, FREET4, T3FREE, THYROIDAB in the last 72 hours. Anemia Panel: No results for input(s): VITAMINB12, FOLATE, FERRITIN, TIBC, IRON, RETICCTPCT in the last 72 hours. Sepsis Labs: Recent Labs  Lab 09/10/19 2026  PROCALCITON 1.53    Recent Results (from the past 240 hour(s))  SARS CORONAVIRUS 2 (TAT 6-24 HRS) Nasopharyngeal Nasopharyngeal Swab     Status: None   Collection Time: 09/10/19  5:00 PM   Specimen: Nasopharyngeal Swab  Result Value Ref Range Status   SARS Coronavirus 2 NEGATIVE NEGATIVE Final    Comment: (NOTE) SARS-CoV-2 target nucleic acids are NOT DETECTED. The SARS-CoV-2 RNA is generally detectable in upper and lower respiratory specimens during the acute phase of infection. Negative results do not preclude SARS-CoV-2 infection, do not rule out co-infections with other pathogens, and should not be used as the sole basis for treatment or other patient management decisions. Negative results must be combined with clinical observations, patient history, and epidemiological information. The expected result is Negative. Fact Sheet for Patients: SugarRoll.be Fact Sheet for Healthcare  Providers: https://www.woods-mathews.com/ This test is not yet approved or cleared by the [place] FDA and  has been authorized for detection and/or diagnosis of SARS-CoV-2 by FDA under an Emergency Use Authorization (EUA). This EUA will remain  in effect (meaning this test can be used) for the duration of the COVID-19 declaration under Section 56 4(b)(1) of the Act, 21 U.S.C. section 360bbb-3(b)(1), unless the authorization is terminated or revoked sooner. Performed at [facility], [place] [address], [place], [place] [address]   MRSA PCR Screening     Status: None   Collection Time: 09/10/19  8:05 PM   Specimen: Nasal Mucosa; Nasopharyngeal  Result Value Ref Range Status   MRSA by PCR NEGATIVE NEGATIVE Final    Comment:        The GeneXpert MRSA Assay (FDA approved for NASAL specimens only), is one component of a comprehensive MRSA colonization surveillance program. It is not intended to diagnose MRSA infection nor to guide or monitor treatment for MRSA infections. Performed at [facility], [place] [address], [place], [place] [address]   Culture, blood (Routine X 2) w Reflex to ID Panel     Status: Abnormal   Collection Time: 09/10/19  8:26 PM   Specimen: BLOOD  Result Value Ref Range Status  Specimen Description   Final    BLOOD RIGHT ANTECUBITAL Performed at [facility] [address], [place], [place] [address]    Special Requests   Final    BOTTLES DRAWN AEROBIC ONLY Blood Culture adequate volume Performed at [place] [address], [place], [place] [address]    Culture  Setup Time   Final    AEROBIC BOTTLE ONLY GRAM NEGATIVE RODS CRITICAL RESULT CALLED TO, READ BACK BY AND VERIFIED WITH: L POINDEXTER PHARMD 09/11/19 2021 JDW Performed at [facility], [place] [address], [place], [place] [address]    Culture ESCHERICHIA COLI (A)  Final   Report Status 09/13/2019 FINAL  Final    Organism ID, Bacteria ESCHERICHIA COLI  Final      Susceptibility   Escherichia coli - MIC*    AMPICILLIN <=2 SENSITIVE Sensitive     CEFAZOLIN <=4 SENSITIVE Sensitive     CEFEPIME <=0.12 SENSITIVE Sensitive     CEFTAZIDIME <=1 SENSITIVE Sensitive     CEFTRIAXONE <=0.25 SENSITIVE Sensitive     CIPROFLOXACIN <=0.25 SENSITIVE Sensitive     GENTAMICIN <=1 SENSITIVE Sensitive     IMIPENEM <=0.25 SENSITIVE Sensitive     TRIMETH/SULFA <=20 SENSITIVE Sensitive     AMPICILLIN/SULBACTAM <=2 SENSITIVE Sensitive     PIP/TAZO <=4 SENSITIVE Sensitive     * ESCHERICHIA COLI  Culture, blood (Routine X 2) w Reflex to ID Panel     Status: Abnormal   Collection Time: 09/10/19  8:26 PM   Specimen: BLOOD  Result Value Ref Range Status   Specimen Description   Final    BLOOD RIGHT PORTA CATH Performed at [place] [address] [place], [place] [address]    Special Requests   Final    BOTTLES DRAWN AEROBIC ONLY Blood Culture results may not be optimal due to an inadequate volume of blood received in culture bottles Performed at [place] [address] [place], [place] [address]    Culture  Setup Time   Final    AEROBIC BOTTLE ONLY GRAM NEGATIVE RODS CRITICAL VALUE NOTED.  VALUE IS CONSISTENT WITH PREVIOUSLY REPORTED AND CALLED VALUE.    Culture (A)  Final    ESCHERICHIA COLI SUSCEPTIBILITIES PERFORMED ON PREVIOUS CULTURE WITHIN THE LAST 5 DAYS. Performed at [facility], [place] [address], [place], [place] [address]    Report Status 09/13/2019 FINAL  Final  Blood Culture ID Panel (Reflexed)     Status: Abnormal   Collection Time: 09/10/19  8:26 PM  Result Value Ref Range Status   Enterococcus species NOT DETECTED NOT DETECTED Final   Listeria monocytogenes NOT DETECTED NOT DETECTED Final   Staphylococcus species NOT DETECTED NOT DETECTED Final   Staphylococcus aureus (BCID) NOT DETECTED NOT DETECTED Final   Streptococcus species NOT DETECTED  NOT DETECTED Final   Streptococcus agalactiae NOT DETECTED NOT DETECTED Final   Streptococcus pneumoniae NOT DETECTED NOT DETECTED Final   Streptococcus pyogenes NOT DETECTED NOT DETECTED Final   Acinetobacter baumannii NOT DETECTED NOT DETECTED Final   Enterobacteriaceae species DETECTED (A) NOT DETECTED Final    Comment: Enterobacteriaceae represent a large family of gram-negative bacteria, not a single organism. CRITICAL RESULT CALLED TO, READ BACK BY AND VERIFIED WITH: L POINDEXTER PHARMD 09/11/19 2021 JDW    Enterobacter cloacae complex NOT DETECTED NOT DETECTED Final   Escherichia coli DETECTED (A) NOT DETECTED Final    Comment: CRITICAL RESULT CALLED TO, READ  BACK BY AND VERIFIED WITH: L POINDEXTER PHARMD 09/11/19 2021 JDW    Klebsiella oxytoca NOT DETECTED NOT DETECTED Final   Klebsiella pneumoniae NOT DETECTED NOT DETECTED Final   Proteus species NOT DETECTED NOT DETECTED Final   Serratia marcescens NOT DETECTED NOT DETECTED Final   Carbapenem resistance NOT DETECTED NOT DETECTED Final   Haemophilus influenzae NOT DETECTED NOT DETECTED Final   Neisseria meningitidis NOT DETECTED NOT DETECTED Final   Pseudomonas aeruginosa NOT DETECTED NOT DETECTED Final   Candida albicans NOT DETECTED NOT DETECTED Final   Candida glabrata NOT DETECTED NOT DETECTED Final   Candida krusei NOT DETECTED NOT DETECTED Final   Candida parapsilosis NOT DETECTED NOT DETECTED Final   Candida tropicalis NOT DETECTED NOT DETECTED Final    Comment: Performed at [facility], [place] [address], [place], [place] [address]  Urine culture     Status: Abnormal   Collection Time: 09/10/19 10:32 PM   Specimen: Urine, Random  Result Value Ref Range Status   Specimen Description   Final    URINE, RANDOM Performed at [place] [address], [place], [place] [address]    Special Requests   Final    NONE Performed at [facility], [place] [address] [place], [place]  [address]    Culture 80,000 COLONIES/mL ESCHERICHIA COLI (A)  Final   Report Status 09/13/2019 FINAL  Final   Organism ID, Bacteria ESCHERICHIA COLI (A)  Final      Susceptibility   Escherichia coli - MIC*    AMPICILLIN 8 SENSITIVE Sensitive     CEFAZOLIN <=4 SENSITIVE Sensitive     CEFTRIAXONE <=0.25 SENSITIVE Sensitive     CIPROFLOXACIN <=0.25 SENSITIVE Sensitive     GENTAMICIN <=1 SENSITIVE Sensitive     IMIPENEM <=0.25 SENSITIVE Sensitive     NITROFURANTOIN <=16 SENSITIVE Sensitive     TRIMETH/SULFA <=20 SENSITIVE Sensitive     AMPICILLIN/SULBACTAM <=2 SENSITIVE Sensitive     PIP/TAZO <=4 SENSITIVE Sensitive     * 80,000 COLONIES/mL ESCHERICHIA COLI  Culture, blood (Routine X 2) w Reflex to ID Panel     Status: None (Preliminary result)   Collection Time: 09/12/19  6:43 AM   Specimen: BLOOD RIGHT HAND  Result Value Ref Range Status   Specimen Description   Final    BLOOD RIGHT HAND Performed at [place] [address] [place], [place] [address]    Special Requests   Final    BOTTLES DRAWN AEROBIC AND ANAEROBIC Blood Culture adequate volume Performed at [place] [address], [place], [place] [address]    Culture   Final    NO GROWTH 4 DAYS Performed at [facility], [place] [address] [place], [place] [address]    Report Status PENDING  Incomplete  Culture, blood (Routine X 2) w Reflex to ID Panel     Status: None (Preliminary result)   Collection Time: 09/12/19  6:43 AM   Specimen: BLOOD  Result Value Ref Range Status   Specimen Description   Final    BLOOD RIGHT ARM Performed at [place] [address] [place], [place] [address]    Special Requests   Final    BOTTLES DRAWN AEROBIC ONLY Blood Culture adequate volume Performed at [place] [address], [place],  [address]    Culture   Final    NO GROWTH 4 DAYS Performed at [facility], [address] [place], [place] [address]    Report Status PENDING  Incomplete  Culture, respiratory     Status: None   Collection Time: 09/12/19 10:25 AM   Specimen: Bronchoalveolar Lavage; Respiratory  Result Value Ref Range Status   Specimen Description   Final    BRONCHIAL ALVEOLAR LAVAGE RLL Performed at [place] [address], [place], [place] [address]    Special Requests   Final    NONE Performed at [facility], [place] [address], [place], [place] [address]    Gram Stain   Final    RARE WBC PRESENT, PREDOMINANTLY MONONUCLEAR NO SQUAMOUS EPITHELIAL CELLS PRESENT NO ORGANISMS SEEN    Culture   Final    Consistent with normal respiratory flora. Performed at [facility], [place] [address], [place], [place] [address]    Report Status 09/14/2019 FINAL  Final  Fungus Culture With Stain     Status: None (Preliminary result)   Collection Time: 09/12/19 10:25 AM   Specimen: Bronchial Alveolar Lavage  Result Value Ref Range Status   Fungus Stain Final report  Final    Comment: (NOTE) Performed At: [facility] [place], [place] [id number] Rush Farmer MD SH:[id number]    Fungus (Mycology) Culture PENDING  Incomplete   Fungal Source BRONCHIAL ALVEOLAR LAVAGE  Final    Comment: RLL Performed at [facility], [place] [address], [place], [place] [address]   Respiratory Panel by PCR     Status: None   Collection Time: 09/12/19 10:25 AM   Specimen: Bronchial Alveolar Lavage; Respiratory  Result Value Ref Range Status   Adenovirus NOT DETECTED NOT DETECTED Final   Coronavirus 229E NOT DETECTED NOT DETECTED Final    Comment: (NOTE) The Coronavirus on the Respiratory Panel, DOES NOT test for the novel  Coronavirus (2019 nCoV)    Coronavirus HKU1 NOT DETECTED NOT DETECTED Final   Coronavirus NL63 NOT DETECTED NOT DETECTED Final   Coronavirus OC43 NOT DETECTED NOT DETECTED Final   Metapneumovirus NOT  DETECTED NOT DETECTED Final   Rhinovirus / Enterovirus NOT DETECTED NOT DETECTED Final   Influenza A NOT DETECTED NOT DETECTED Final   Influenza B NOT DETECTED NOT DETECTED Final   Parainfluenza Virus 1 NOT DETECTED NOT DETECTED Final   Parainfluenza Virus 2 NOT DETECTED NOT DETECTED Final   Parainfluenza Virus 3 NOT DETECTED NOT DETECTED Final   Parainfluenza Virus 4 NOT DETECTED NOT DETECTED Final   Respiratory Syncytial Virus NOT DETECTED NOT DETECTED Final   Bordetella pertussis NOT DETECTED NOT DETECTED Final   Chlamydophila pneumoniae NOT DETECTED NOT DETECTED Final   Mycoplasma pneumoniae NOT DETECTED NOT DETECTED Final    Comment: Performed at [facility], [place] [address] [place], [place] [address]  Fungus Culture Result     Status: None   Collection Time: 09/12/19 10:25 AM  Result Value Ref Range Status   Result 1 Comment  Final    Comment: (NOTE) KOH/Calcofluor preparation:  no fungus observed. Performed At: [facility] [place], [place] [id number] Rush Farmer MD BS:[id number]   Pneumocystis smear by DFA     Status: None   Collection Time: 09/12/19 10:26 AM   Specimen: Bronchoalveolar Lavage; Respiratory  Result Value Ref Range Status   Specimen Source-PJSRC BRONCHIAL ALVEOLAR LAVAGE  Final    Comment: RLL   Pneumocystis jiroveci Ag NEGATIVE  Final    Comment: Performed at [facility] Performed at [facility], [place] [address], [place],  [address]          Radiology Studies: No results found.      Scheduled Meds: . Chlorhexidine Gluconate Cloth  6 each Topical Daily  . docusate sodium  200 mg Oral QHS  . dorzolamide  1 drop Both Eyes BID  . latanoprost  1 drop Both Eyes Daily  . metoprolol tartrate  12.5 mg Oral BID  . pantoprazole  40 mg Oral BID  . polyethylene glycol  17 g Oral Daily  . rivaroxaban  15 mg Oral Q breakfast  . tamsulosin  0.4 mg Oral QHS  . timolol  1 drop Both  Eyes BID   Continuous Infusions: . cefTRIAXone (ROCEPHIN)  IV 2 g (09/16/19 1201)     LOS: 6 days     Cordelia Poche, MD [facility] 09/16/2019, 2:05 PM  If 7PM-7AM, please contact night-coverage [email or web address]

## 2019-09-16 NOTE — Progress Notes (Signed)
NAME:  Joe Cantu, MRN:  [id number], DOB:  08/05/1950, LOS: 6 ADMISSION DATE:  09/10/2019, CONSULTATION DATE:  09/09/2018 REFERRING MD:  Rod Can MD, CHIEF COMPLAINT:  PE, Hemoptysis  Brief History   Consult for hemoptysis Hospitalized after syncopal episode and was found to have massive bilateral PE with right heart strain. Received catheter directed TPA on 2/17 and IVC filter was placed on 2/18. Patient experienced bleeding at groins site during TPA administration requiring throbin pad and sand bag placement.  Patient was initially placed on heparin drip, subsequently transitioned to Xarelto.  Hospital follow up with NP Clent Ridges  2/26 on room air no cp, hemoptysis then acute onset hemoptysis p eating lunch ? Gagged?  on day of admit x ? 150cc of dark blood to brought to ER where no drop in hgb and no acute change on cxr and PCCM service asked to consult. Note w/u also c/w ?liver dz with INR elevation.  Note pt denies any increase sob with onset of hemoptysis, also no cp, no sob over baseline, no assoc epistaxis. Has not had any recurrent bleeding since arrival with mostly dry cough.  Past Medical History    has a past medical history of Aortic root dilatation (HCC) (02/21/2019), Dyspnea on exertion, Frequent unifocal PVCs (02/21/2019), Glaucoma, Gonorrhea, Left inguinal hernia, Pneumonia, Ventricular premature beats, and Wears dentures.  Significant Hospital Events   2/28 Admit  Consults:  PCCM, hematology  Procedures:    Significant Diagnostic Tests:  Right upper quadrant ultrasound 09/10/2019- Normal study  CTA 09/11/2019-PEs which are slightly decreased in size.  Mild bibasal atelectasis/consolidation.  Echocardiogram 09/11/2019-EF 50-55%, grade 1 diastolic dysfunction edema overload, moderate pulmonary hypertension, RVSP 56.4. (RVSP was 76 on 2/17)  Micro Data:  Blood culture 2/28 >> E. coli  Antimicrobials:  Zosyn 2/28 >> 3/3   Interim history/subjective:  No hemoptysis  overnight.  Objective   Blood pressure 136/80, pulse 79, temperature 98.6 F (37 C), temperature source Oral, resp. rate 18, height 6\' 3"  (1.905 m), weight 77.3 kg, SpO2 99 %.        Intake/Output Summary (Last 24 hours) at 09/16/2019 0950 Last data filed at 09/16/2019 0630 Gross per 24 hour  Intake 400 ml  Output 2375 ml  Net -1975 ml   Filed Weights   09/10/19 2054 09/12/19 0846 09/15/19 1115  Weight: 77.3 kg 77.3 kg 77.3 kg    Examination: Today's Vitals   09/15/19 1330 09/15/19 1404 09/15/19 2100 09/16/19 0611  BP: 117/70 (!) 121/94  136/80  Pulse: 75 79  79  Resp: 19 16  18   Temp:  98.3 F (36.8 C)  98.6 F (37 C)  TempSrc:  Oral  Oral  SpO2: 97% 100%  99%  Weight:      Height:      PainSc: 0-No pain  0-No pain    Body mass index is 21.3 kg/m.  Physical exam  General: Thin middle aged male sitting up in bed , in NAD HEENT: MMM, trachea midline Neuro: Moves ext to command, good strength CV: RRR, ext warm PULM:  Clear to ascultation bilaterally, no accessory muscle use GI: soft, bowel sounds active in all 4 quadrants, non-tender, non-distended Extremities: warm/dry, no edema  Skin: no rashes or lesions  Resolved Hospital Problem list     Assessment & Plan:  Hemoptysis in the setting of anticoagulation - No source found on bronch/EGD/nares exam  P: Restart reduced dose xarelto If recurrence, quantify and check CT chest (no contrast needed)  to help target repeat bronch if needed  Recent submassive PE s/p cdTPA 2/17, IVC filter,  Factor 5 leiden P: Due for IVCF removal late May 2021 Reduced dose xarelto  Will follow with you  Erskine Emery MD PCCM

## 2019-09-16 NOTE — Progress Notes (Signed)
Occupational Therapy Evaluation Patient Details Name: Joe Cantu MRN: [id number] DOB: Mar 04, 1951 Today's Date: 09/16/2019    History of Present Illness Joe Cantu is a 69 y.o. male with medical history significant of frequent PVCs and aortic root dilation and admitted for syncopal event . CTA of the chest was significant for a massive PE with complete occlusion of the right main artery with absent arterial blood flow to right lung in addition to partially occlusive filling defects of left lower lobe pulmonary arteries; also evidence of significant right heart strain with associated right heart dilation.  Pt admitted for treatment of acute massive PE and left lower extremity deep vein thrombosis with significant right heart dysfunction   Clinical Impression   Patient reports living home alone in single story house, but plans to discharge to fiance's home. Patient reports being Independent with all self-care tasks and functional mobility at PLOF. Overall patient performs self-care tasks at Modified Independence level to Minimal assist level, with Minimal assist for functional mobility using RW. Patient was able to complete grooming at sink with standing tolerance of about 2 minutes before requesting to sit. Recommend continued skilled acute services to decrease level of assist upon d/c with self-care and mobility.      Follow Up Recommendations  Home health OT;Supervision/Assistance - 24 hour    Equipment Recommendations  3 in 1 bedside commode    Recommendations for Other Services       Precautions / Restrictions Precautions Precautions: Fall Restrictions Weight Bearing Restrictions: No      Mobility Bed Mobility Overal bed mobility: Needs Assistance Bed Mobility: Supine to Sit     Supine to sit: Min guard        Transfers Overall transfer level: Needs assistance Equipment used: Rolling walker (2 wheeled)   Sit to Stand: Min guard         General transfer comment:  Cues for hand placement with the use of RW    Balance     Sitting balance-Leahy Scale: Fair       Standing balance-Leahy Scale: Fair                             ADL either performed or assessed with clinical judgement   ADL Overall ADL's : Needs assistance/impaired Eating/Feeding: Modified independent   Grooming: Min guard;Standing   Upper Body Bathing: Set up;Sitting   Lower Body Bathing: Minimal assistance;Sit to/from stand;Cueing for safety;Cueing for compensatory techniques;Cueing for sequencing   Upper Body Dressing : Set up;Sitting   Lower Body Dressing: Minimal assistance;Sit to/from stand;Cueing for safety;Cueing for sequencing   Toilet Transfer: Ambulation;Cueing for safety;Cueing for sequencing;Minimal assistance;RW   Toileting- Clothing Manipulation and Hygiene: Min guard;Cueing for safety;Sit to/from stand   Tub/ Shower Transfer: Minimal assistance;Rolling walker   Functional mobility during ADLs: Minimal assistance       Vision Baseline Vision/History: Wears glasses Patient Visual Report: No change from baseline       Perception     Praxis      Pertinent Vitals/Pain Pain Assessment: No/denies pain     Hand Dominance Right   Extremity/Trunk Assessment Upper Extremity Assessment Upper Extremity Assessment: RUE deficits/detail;LUE deficits/detail RUE: (Grossly 3+/5, AROM WFL) LUE: (Grossly 3+/5, AROM WFL)   Lower Extremity Assessment Lower Extremity Assessment: Defer to PT evaluation       Communication Communication Communication: No difficulties   Cognition Arousal/Alertness: Awake/alert Behavior During Therapy: WFL for tasks assessed/performed Overall Cognitive  Status: Within Functional Limits for tasks assessed                                     General Comments       Exercises     Shoulder Instructions      Home Living Family/patient expects to be discharged to:: Private residence Living  Arrangements: Other relatives Available Help at Discharge: Family Type of Home: House Home Access: Stairs to enter Technical brewer of Steps: [place]: One level     Bathroom Shower/Tub: Walk-in shower;Tub/shower unit   Bathroom Toilet: Standard Bathroom Accessibility: Yes   Home Equipment: Cane - single point;Walker - 2 wheels          Prior Functioning/Environment Level of Independence: Independent                 OT Problem List: Decreased activity tolerance;Impaired balance (sitting and/or standing)      OT Treatment/Interventions: Self-care/ADL training;Patient/family education;DME and/or AE instruction    OT Goals(Current goals can be found in the care plan section) Acute Rehab OT Goals Patient Stated Goal: wants to get home as soon as feasible OT Goal Formulation: With patient Time For Goal Achievement: 09/30/19 Potential to Achieve Goals: Good  OT Frequency: Min 2X/week   Barriers to D/C:            Co-evaluation              AM-PAC OT "6 Clicks" Daily Activity     Outcome Measure Help from another person eating meals?: None Help from another person taking care of personal grooming?: A Little Help from another person toileting, which includes using toliet, bedpan, or urinal?: A Little Help from another person bathing (including washing, rinsing, drying)?: A Little Help from another person to put on and taking off regular upper body clothing?: A Little Help from another person to put on and taking off regular lower body clothing?: A Little 6 Click Score: 19   End of Session Equipment Utilized During Treatment: Rolling walker Nurse Communication: Mobility status  Activity Tolerance: Patient limited by fatigue Patient left: in chair;with call bell/phone within reach;with chair alarm set  OT Visit Diagnosis: Unsteadiness on feet (R26.81);Muscle weakness (generalized) (M62.81);History of falling (Z91.81)                Time:  2979-8921 OT Time Calculation (min): 35 min Charges:  OT General Charges $OT Visit: 1 Visit OT Evaluation $OT Eval Moderate Complexity: 1 Mod OT Treatments $Self Care/Home Management : 8-22 mins  Joe Cantu OTR/L   Joe Cantu 09/16/2019, 2:12 PM

## 2019-09-17 LAB — CBC
HCT: 34.9 % — ABNORMAL LOW (ref 39.0–52.0)
Hemoglobin: 11.2 g/dL — ABNORMAL LOW (ref 13.0–17.0)
MCH: 30.3 pg (ref 26.0–34.0)
MCHC: 32.1 g/dL (ref 30.0–36.0)
MCV: 94.3 fL (ref 80.0–100.0)
Platelets: 514 10*3/uL — ABNORMAL HIGH (ref 150–400)
RBC: 3.7 MIL/uL — ABNORMAL LOW (ref 4.22–5.81)
RDW: 13.9 % (ref 11.5–15.5)
WBC: 7.3 10*3/uL (ref 4.0–10.5)
nRBC: 0 % (ref 0.0–0.2)

## 2019-09-17 LAB — CULTURE, BLOOD (ROUTINE X 2)
Culture: NO GROWTH
Culture: NO GROWTH
Special Requests: ADEQUATE
Special Requests: ADEQUATE

## 2019-09-17 MED ORDER — SODIUM CHLORIDE 0.9 % IV SOLN
2.0000 g | INTRAVENOUS | Status: AC
  Administered 2019-09-17: 2 g via INTRAVENOUS
  Filled 2019-09-17: qty 2

## 2019-09-17 NOTE — Progress Notes (Signed)
Pulm Note If patient starts coughing up blood again with [facility] challenge, repeat CT chest and make NPO at midnight. Will see tomorrow, call if issues today.

## 2019-09-17 NOTE — Progress Notes (Signed)
PROGRESS NOTE    Joe Cantu  ATF:[id number] DOB: 20-Apr-1951 DOA: 09/10/2019 PCP: Iona Beard, MD   Brief Narrative: Joe Cantu is a 69 y.o. male with a history of frequent PVCs, massive PE, left lower extremity DVT s/p IVC filter, factor v Leiden (heterozygous). Patient presented secondary to hemoptysis in setting of Xarelto use for PE. Bronchoscopy performed which was not significant for acute bleed. Xarelto restarted and patient has recurrent hemoptysis. Hemoglobin appears to be stable at this time.   Assessment & Plan:   Principal Problem:   Hemoptysis Active Problems:   Aortic root dilatation (HCC)   Frequent unifocal PVCs   Acute massive pulmonary embolism (HCC)   DVT (deep venous thrombosis) (HCC)   Transaminitis   Hemoptysis In setting of recent initiation of anticoagulation and coughing spells. Xarelto initially held and pulmonology consulted. No recurrent hemoptysis and hemoglobin has been stable. Pulmonology performed a bronchoscopy on 3/2 which was significant for no bleeding source. Oncology was consulted for recommendations on Xarelto dose in setting of acute PE (massive) in addition to extensive LE DVT s/p IVC filter and recommendation to decrease dose to Xarelto 15 mg daily to complete currently available prescription followed by reduction to Xarelto 10 mg. Patient with recurrent ?hemoptysis. Discussed with pulmonology with concern this may not be originating from airway; recommending GI consult. GI planning EGD today. BAL without malignant cells. Hemoglobin is stable in setting of continued hemoptysis. PT recommending SNF however patient is declining. OT recommending home health. SLP recommending dysphagia 3 diet with thin liquids. -Continue Xarelto 15 mg daily -Daily CBC  History of massive PE History of left LE DVT Factor V Leiden, heterozygous Patient was on Xarelto 15 mg BID prior to admission. IVC filter also placed prior to admission. Patient's Xarelto dose  has been adjusted as mentioned above to Xarelto 15 mg daily followed by Xarelto 10 mg daily. Patient has follow up with hematology scheduled.  Acute transaminitis In setting of massive PE and RV dysfunction. Improved.  Frequent PVCs -Continue metoprolol  Prolonged QTc Noted.  Hypokalemia Resolved.   DVT prophylaxis: Xarelto Code Status:   Code Status: Full Code Family Communication: None at bedside Disposition Plan: Discharge pending continued workup for etiology vs resolution of hemoptysis   Consultants:   Pulmonology  Hematology  Procedures:   BRONCHOSCOPY (3/2) Impression:      - Hemoptysis      - The airway examination did not show endbronchial abnormalities or       active bleeding. Blood tinged mucus and old blood noted in the bilateral       airways.      - Bronchoalveolar lavage was performed.   UPPER GI  ENDOSCOPY (3/5) Impression:               - Normal esophagus.                           - Normal stomach.                           - Normal examined duodenum.                           - No specimens collected.  Recommendation:           - Return patient to hospital ward for ongoing care.                           -  Resume regular diet.                           - Continue present medications.                           - Management per Pulmonary.                           - No further GI evaluation at this time. Signing                            off.  Antimicrobials:  None    Subjective: No hemoptysis  Objective: Vitals:   09/16/19 0611 09/16/19 1308 09/16/19 2208 09/17/19 0550  BP: 136/80 114/82 115/81 114/77  Pulse: 79 83 76 79  Resp: _0 Temp: 98.6 F (37 C) 97.6 F (36.4 C) 98.2 F (36.8 C) 98.1 F (36.7 C)  TempSrc: Oral Oral Oral Oral  SpO2: 99% 99% 100% 100%  Weight:      Height:        Intake/Output Summary (Last 24 hours) at 09/17/2019 1340 Last data filed at 09/17/2019 1100 Gross per 24 hour  Intake 780 ml    Output 1650 ml  Net -870 ml   Filed Weights   09/10/19 2054 09/12/19 0846 09/15/19 1115  Weight: 77.3 kg 77.3 kg 77.3 kg    Examination:  General exam: Appears calm and comfortable Respiratory system: Clear to auscultation. Respiratory effort normal. Cardiovascular system: S1 & S2 heard, RRR. No murmurs, rubs, gallops or clicks. Gastrointestinal system: Abdomen is nondistended, soft and nontender. No organomegaly or masses felt. Normal bowel sounds heard. Central nervous system: Alert and oriented. No focal neurological deficits. Extremities: No edema. No calf tenderness Skin: No cyanosis. No rashes Psychiatry: Judgement and insight appear normal. Mood & affect appropriate.    Data Reviewed: I have personally reviewed following labs and imaging studies  CBC: Recent Labs  Lab 09/10/19 1542 09/11/19 0317 09/13/19 0255 09/13/19 0255 09/13/19 1453 09/14/19 0514 09/15/19 0838 09/16/19 0532 09/17/19 0859  WBC 14.2*   < > 12.2*  --   --  7.3 7.3 7.4 7.3  NEUTROABS 12.3*  --   --   --   --   --   --   --   --   HGB 10.4*   < > 9.8*   < > 10.7* 10.6* 10.8* 10.1* 11.2*  HCT 31.8*   < > 31.2*   < > 33.5* 32.4* 32.8* 31.6* 34.9*  MCV 93.8   < > 95.4  --   --  94.7 94.5 94.6 94.3  PLT 312   < > 308  --   --  358 420* 453* 514*   < > = values in this interval not displayed.   Basic Metabolic Panel: Recent Labs  Lab 09/10/19 1542 09/10/19 2026 09/11/19 0317 09/12/19 0148 09/13/19 0255 09/14/19 0514  NA 132*  --  139 138 135 134*  K 3.3*  --  4.7 3.8 3.4* 3.9  CL 101  --  105 107 107 106  CO2 23  --  24 21* 19* 19*  GLUCOSE 124*  --  110* 113* 124* 101*  BUN 17  --  _1 CREATININE 0.92  --  0.88 0.83 0.76 0.72  CALCIUM 8.8*  --  8.9 9.3 8.8* 9.4  MG  --  1.9  --   --   --   --    GFR: Estimated Creatinine Clearance: 99.3 mL/min (by C-G formula based on SCr of 0.72 mg/dL). Liver Function Tests: Recent Labs  Lab 09/10/19 1542 09/11/19 0317 09/12/19 0643   AST 134* 70* 41  ALT 110* 89* 69*  ALKPHOS 211* 173* 152*  BILITOT 1.3* 1.3* 1.8*  PROT 6.5 6.0* 5.8*  ALBUMIN 2.3* 2.2* 2.0*   Recent Labs  Lab 09/10/19 1542  LIPASE 31   No results for input(s): AMMONIA in the last 168 hours. Coagulation Profile: Recent Labs  Lab 09/10/19 1542 09/12/19 0643  INR 2.7* 1.3*   Cardiac Enzymes: No results for input(s): CKTOTAL, CKMB, CKMBINDEX, TROPONINI in the last 168 hours. BNP (last 3 results) No results for input(s): PROBNP in the last 8760 hours. HbA1C: No results for input(s): HGBA1C in the last 72 hours. CBG: No results for input(s): GLUCAP in the last 168 hours. Lipid Profile: No results for input(s): CHOL, HDL, LDLCALC, TRIG, CHOLHDL, LDLDIRECT in the last 72 hours. Thyroid Function Tests: No results for input(s): TSH, T4TOTAL, FREET4, T3FREE, THYROIDAB in the last 72 hours. Anemia Panel: No results for input(s): VITAMINB12, FOLATE, FERRITIN, TIBC, IRON, RETICCTPCT in the last 72 hours. Sepsis Labs: Recent Labs  Lab 09/10/19 2026  PROCALCITON 1.53    Recent Results (from the past 240 hour(s))  SARS CORONAVIRUS 2 (TAT 6-24 HRS) Nasopharyngeal Nasopharyngeal Swab     Status: None   Collection Time: 09/10/19  5:00 PM   Specimen: Nasopharyngeal Swab  Result Value Ref Range Status   SARS Coronavirus 2 NEGATIVE NEGATIVE Final    Comment: (NOTE) SARS-CoV-2 target nucleic acids are NOT DETECTED. The SARS-CoV-2 RNA is generally detectable in upper and lower respiratory specimens during the acute phase of infection. Negative results do not preclude SARS-CoV-2 infection, do not rule out co-infections with other pathogens, and should not be used as the sole basis for treatment or other patient management decisions. Negative results must be combined with clinical observations, patient history, and epidemiological information. The expected result is Negative. Fact Sheet for  Patients: SugarRoll.be Fact Sheet for Healthcare Providers: https://www.woods-mathews.com/ This test is not yet approved or cleared by the [place] FDA and  has been authorized for detection and/or diagnosis of SARS-CoV-2 by FDA under an Emergency Use Authorization (EUA). This EUA will remain  in effect (meaning this test can be used) for the duration of the COVID-19 declaration under Section 56 4(b)(1) of the Act, 21 U.S.C. section 360bbb-3(b)(1), unless the authorization is terminated or revoked sooner. Performed at [facility], [place] [address], [place], [place] [address]   MRSA PCR Screening     Status: None   Collection Time: 09/10/19  8:05 PM   Specimen: Nasal Mucosa; Nasopharyngeal  Result Value Ref Range Status   MRSA by PCR NEGATIVE NEGATIVE Final    Comment:        The GeneXpert MRSA Assay (FDA approved for NASAL specimens only), is one component of a comprehensive MRSA colonization surveillance program. It is not intended to diagnose MRSA infection nor to guide or monitor treatment for MRSA infections. Performed at [facility], [place] [address], [place], [place] [address]   Culture, blood (Routine X 2) w Reflex to ID Panel     Status: Abnormal   Collection Time: 09/10/19  8:26 PM   Specimen: BLOOD  Result  Value Ref Range Status   Specimen Description   Final    BLOOD RIGHT ANTECUBITAL Performed at [facility] [address] [place], [place] [address]    Special Requests   Final    BOTTLES DRAWN AEROBIC ONLY Blood Culture adequate volume Performed at [facility] [address] [place], [place] [address]    Culture  Setup Time   Final    AEROBIC BOTTLE ONLY GRAM NEGATIVE RODS CRITICAL RESULT CALLED TO, READ BACK BY AND VERIFIED WITH: L POINDEXTER PHARMD 09/11/19 2021 JDW Performed at [facility], [place] [address], [place], [place] [address]     Culture ESCHERICHIA COLI (A)  Final   Report Status 09/13/2019 FINAL  Final   Organism ID, Bacteria ESCHERICHIA COLI  Final      Susceptibility   Escherichia coli - MIC*    AMPICILLIN <=2 SENSITIVE Sensitive     CEFAZOLIN <=4 SENSITIVE Sensitive     CEFEPIME <=0.12 SENSITIVE Sensitive     CEFTAZIDIME <=1 SENSITIVE Sensitive     CEFTRIAXONE <=0.25 SENSITIVE Sensitive     CIPROFLOXACIN <=0.25 SENSITIVE Sensitive     GENTAMICIN <=1 SENSITIVE Sensitive     IMIPENEM <=0.25 SENSITIVE Sensitive     TRIMETH/SULFA <=20 SENSITIVE Sensitive     AMPICILLIN/SULBACTAM <=2 SENSITIVE Sensitive     PIP/TAZO <=4 SENSITIVE Sensitive     * ESCHERICHIA COLI  Culture, blood (Routine X 2) w Reflex to ID Panel     Status: Abnormal   Collection Time: 09/10/19  8:26 PM   Specimen: BLOOD  Result Value Ref Range Status   Specimen Description   Final    BLOOD RIGHT PORTA CATH Performed at [facility] [address] [place], [place] [address]    Special Requests   Final    BOTTLES DRAWN AEROBIC ONLY Blood Culture results may not be optimal due to an inadequate volume of blood received in culture bottles Performed at [facility] [address], [place], [place] [address]    Culture  Setup Time   Final    AEROBIC BOTTLE ONLY GRAM NEGATIVE RODS CRITICAL VALUE NOTED.  VALUE IS CONSISTENT WITH PREVIOUSLY REPORTED AND CALLED VALUE.    Culture (A)  Final    ESCHERICHIA COLI SUSCEPTIBILITIES PERFORMED ON PREVIOUS CULTURE WITHIN THE LAST 5 DAYS. Performed at [facility], [place] [address] [place],  [address]    Report Status 09/13/2019 FINAL  Final  Blood Culture ID Panel (Reflexed)     Status: Abnormal   Collection Time: 09/10/19  8:26 PM  Result Value Ref Range Status   Enterococcus species NOT DETECTED NOT DETECTED Final   Listeria monocytogenes NOT DETECTED NOT DETECTED Final   Staphylococcus species NOT DETECTED NOT DETECTED Final   Staphylococcus  aureus (BCID) NOT DETECTED NOT DETECTED Final   Streptococcus species NOT DETECTED NOT DETECTED Final   Streptococcus agalactiae NOT DETECTED NOT DETECTED Final   Streptococcus pneumoniae NOT DETECTED NOT DETECTED Final   Streptococcus pyogenes NOT DETECTED NOT DETECTED Final   Acinetobacter baumannii NOT DETECTED NOT DETECTED Final   Enterobacteriaceae species DETECTED (A) NOT DETECTED Final    Comment: Enterobacteriaceae represent a large family of gram-negative bacteria, not a single organism. CRITICAL RESULT CALLED TO, READ BACK BY AND VERIFIED WITH: L POINDEXTER PHARMD 09/11/19 2021 JDW    Enterobacter cloacae complex NOT DETECTED NOT DETECTED Final   Escherichia coli DETECTED (A) NOT DETECTED Final  Comment: CRITICAL RESULT CALLED TO, READ BACK BY AND VERIFIED WITH: L POINDEXTER PHARMD 09/11/19 2021 JDW    Klebsiella oxytoca NOT DETECTED NOT DETECTED Final   Klebsiella pneumoniae NOT DETECTED NOT DETECTED Final   Proteus species NOT DETECTED NOT DETECTED Final   Serratia marcescens NOT DETECTED NOT DETECTED Final   Carbapenem resistance NOT DETECTED NOT DETECTED Final   Haemophilus influenzae NOT DETECTED NOT DETECTED Final   Neisseria meningitidis NOT DETECTED NOT DETECTED Final   Pseudomonas aeruginosa NOT DETECTED NOT DETECTED Final   Candida albicans NOT DETECTED NOT DETECTED Final   Candida glabrata NOT DETECTED NOT DETECTED Final   Candida krusei NOT DETECTED NOT DETECTED Final   Candida parapsilosis NOT DETECTED NOT DETECTED Final   Candida tropicalis NOT DETECTED NOT DETECTED Final    Comment: Performed at [facility], [place] [address], [place], [place] [address]  Urine culture     Status: Abnormal   Collection Time: 09/10/19 10:32 PM   Specimen: Urine, Random  Result Value Ref Range Status   Specimen Description   Final    URINE, RANDOM Performed at [facility] [address] [place], [place] [address]    Special Requests   Final     NONE Performed at [facility], [place] [address], [place], [place] [address]    Culture 80,000 COLONIES/mL ESCHERICHIA COLI (A)  Final   Report Status 09/13/2019 FINAL  Final   Organism ID, Bacteria ESCHERICHIA COLI (A)  Final      Susceptibility   Escherichia coli - MIC*    AMPICILLIN 8 SENSITIVE Sensitive     CEFAZOLIN <=4 SENSITIVE Sensitive     CEFTRIAXONE <=0.25 SENSITIVE Sensitive     CIPROFLOXACIN <=0.25 SENSITIVE Sensitive     GENTAMICIN <=1 SENSITIVE Sensitive     IMIPENEM <=0.25 SENSITIVE Sensitive     NITROFURANTOIN <=16 SENSITIVE Sensitive     TRIMETH/SULFA <=20 SENSITIVE Sensitive     AMPICILLIN/SULBACTAM <=2 SENSITIVE Sensitive     PIP/TAZO <=4 SENSITIVE Sensitive     * 80,000 COLONIES/mL ESCHERICHIA COLI  Culture, blood (Routine X 2) w Reflex to ID Panel     Status: None   Collection Time: 09/12/19  6:43 AM   Specimen: BLOOD RIGHT HAND  Result Value Ref Range Status   Specimen Description   Final    BLOOD RIGHT HAND Performed at [facility] [address], [place], [place] [address]    Special Requests   Final    BOTTLES DRAWN AEROBIC AND ANAEROBIC Blood Culture adequate volume Performed at [facility] [address], [place], [place] [address]    Culture   Final    NO GROWTH 5 DAYS Performed at [facility], [place] [address], [place], [place] [address]    Report Status 09/17/2019 FINAL  Final  Culture, blood (Routine X 2) w Reflex to ID Panel     Status: None   Collection Time: 09/12/19  6:43 AM   Specimen: BLOOD  Result Value Ref Range Status   Specimen Description   Final    BLOOD RIGHT ARM Performed at [facility] [address], [place], [place] [address]    Special Requests   Final    BOTTLES DRAWN AEROBIC ONLY Blood Culture adequate volume Performed at [facility] [address] [place],  [address]    Culture   Final    NO GROWTH 5  DAYS Performed  at [facility], [place] [address], [place], [place] [address]    Report Status 09/17/2019 FINAL  Final  Culture, respiratory     Status: None   Collection Time: 09/12/19 10:25 AM   Specimen: Bronchoalveolar Lavage; Respiratory  Result Value Ref Range Status   Specimen Description   Final    BRONCHIAL ALVEOLAR LAVAGE RLL Performed at [facility] [address], [place], [place] [address]    Special Requests   Final    NONE Performed at [facility], [place] [address], [place], [place] [address]    Gram Stain   Final    RARE WBC PRESENT, PREDOMINANTLY MONONUCLEAR NO SQUAMOUS EPITHELIAL CELLS PRESENT NO ORGANISMS SEEN    Culture   Final    Consistent with normal respiratory flora. Performed at [facility], [place] [address], [place], [place] [address]    Report Status 09/14/2019 FINAL  Final  Fungus Culture With Stain     Status: None (Preliminary result)   Collection Time: 09/12/19 10:25 AM   Specimen: Bronchial Alveolar Lavage  Result Value Ref Range Status   Fungus Stain Final report  Final    Comment: (NOTE) Performed At: [facility] [place], [place] [id number] Rush Farmer MD [id number]    Fungus (Mycology) Culture PENDING  Incomplete   Fungal Source BRONCHIAL ALVEOLAR LAVAGE  Final    Comment: RLL Performed at [facility], [place] [address] [place], [place] [address]   Respiratory Panel by PCR     Status: None   Collection Time: 09/12/19 10:25 AM   Specimen: Bronchial Alveolar Lavage; Respiratory  Result Value Ref Range Status   Adenovirus NOT DETECTED NOT DETECTED Final   Coronavirus 229E NOT DETECTED NOT DETECTED Final    Comment: (NOTE) The Coronavirus on the Respiratory Panel, DOES NOT test for the novel  Coronavirus (2019 nCoV)    Coronavirus HKU1 NOT DETECTED NOT DETECTED Final   Coronavirus NL63 NOT DETECTED NOT DETECTED Final   Coronavirus OC43 NOT  DETECTED NOT DETECTED Final   Metapneumovirus NOT DETECTED NOT DETECTED Final   Rhinovirus / Enterovirus NOT DETECTED NOT DETECTED Final   Influenza A NOT DETECTED NOT DETECTED Final   Influenza B NOT DETECTED NOT DETECTED Final   Parainfluenza Virus 1 NOT DETECTED NOT DETECTED Final   Parainfluenza Virus 2 NOT DETECTED NOT DETECTED Final   Parainfluenza Virus 3 NOT DETECTED NOT DETECTED Final   Parainfluenza Virus 4 NOT DETECTED NOT DETECTED Final   Respiratory Syncytial Virus NOT DETECTED NOT DETECTED Final   Bordetella pertussis NOT DETECTED NOT DETECTED Final   Chlamydophila pneumoniae NOT DETECTED NOT DETECTED Final   Mycoplasma pneumoniae NOT DETECTED NOT DETECTED Final    Comment: Performed at [facility], [place] [address], [place], [place] [address]  Fungus Culture Result     Status: None   Collection Time: 09/12/19 10:25 AM  Result Value Ref Range Status   Result 1 Comment  Final    Comment: (NOTE) KOH/Calcofluor preparation:  no fungus observed. Performed At: [facility] [place], [place] [id number] Rush Farmer MD [id number]   Pneumocystis smear by DFA     Status: None   Collection Time: 09/12/19 10:26 AM   Specimen: Bronchoalveolar Lavage; Respiratory  Result Value Ref Range Status   Specimen Source-PJSRC BRONCHIAL ALVEOLAR LAVAGE  Final    Comment: RLL   Pneumocystis jiroveci Ag NEGATIVE  Final    Comment:  Performed at [facility] Performed at [facility], [place] [address] [place], [place] [address]          Radiology Studies: No results found.      Scheduled Meds: . Chlorhexidine Gluconate Cloth  6 each Topical Daily  . docusate sodium  200 mg Oral QHS  . dorzolamide  1 drop Both Eyes BID  . latanoprost  1 drop Both Eyes Daily  . metoprolol tartrate  12.5 mg Oral BID  . pantoprazole  40 mg Oral BID  . polyethylene glycol  17 g Oral Daily  . rivaroxaban  15 mg Oral Q breakfast  .  tamsulosin  0.4 mg Oral QHS  . timolol  1 drop Both Eyes BID   Continuous Infusions:    LOS: 7 days     Cordelia Poche, MD [facility] 09/17/2019, 1:40 PM  If 7PM-7AM, please contact night-coverage [email or web address]

## 2019-09-17 NOTE — Progress Notes (Signed)
Physical Therapy Treatment Patient Details Name: Joe Cantu MRN: [id number] DOB: 16-Dec-1950 Today's Date: 09/17/2019    History of Present Illness SHANDELL JALLOW is a 69 y.o. male with medical history significant of frequent PVCs and aortic root dilation and admitted for syncopal event . CTA of the chest was significant for a massive PE with complete occlusion of the right main artery with absent arterial blood flow to right lung in addition to partially occlusive filling defects of left lower lobe pulmonary arteries; also evidence of significant right heart strain with associated right heart dilation.  Pt admitted for treatment of acute massive PE and left lower extremity deep vein thrombosis with significant right heart dysfunction    PT Comments    Patient was resting in bed when PT arrived, had just finished eating lunch. Encouraged him to get OOB to [facility] chair- initially wanted to do this later today, but explained the advantage of sitting up OOB more than once daily, and that he could set up again at dinner time. He does require assistance going from supine to sit on edge of bed- caution with urinary catheter and IV. He presents as anxious. Relates that "he just has never been sick and is so weak from not eating...having all the procedures while here in the hospital. Reviewed Ex format- UE and LE as on printed HEP sheet issued to him. Reminded to perform at least 2 x daily- and the ankle pumps, circles more frequently. He is generally weak, with reduced postural and safety awareness. Should benefit from PT to further address optimal functional outcomes.  Follow Up Recommendations  SNF     Equipment Recommendations  None recommended by PT    Recommendations for Other Services       Precautions / Restrictions Precautions Precautions: Fall Precaution Comments: He presents with reduced safety awareness and insight into current medical situation Restrictions Weight Bearing Restrictions: No     Mobility  Bed Mobility Overal bed mobility: Needs Assistance Bed Mobility: Supine to Sit     Supine to sit: Min guard Sit to supine: Min guard   General bed mobility comments: Presents as generally weak  Transfers Overall transfer level: Needs assistance Equipment used: 1 person hand held assist(bed to BS chair) Transfers: Sit to/from Stand Sit to Stand: Min guard Stand pivot transfers: Min guard;Min assist          Ambulation/Gait Ambulation/Gait assistance: Min assist;Min guard Gait Distance (Feet): 10 Feet(IV pole and has urinary catheter- caution with lines.)   Gait Pattern/deviations: Step-through pattern;Decreased stride length   Gait velocity interpretation: 1.31 - 2.62 ft/sec, indicative of limited community ambulator General Gait Details: mild shortness of breath with minimal exertion.   Stairs             Wheelchair Mobility    Modified Rankin (Stroke Patients Only)       Balance Overall balance assessment: Mild deficits observed, not formally tested Sitting-balance support: Bilateral upper extremity supported Sitting balance-Leahy Scale: Fair     Standing balance support: No upper extremity supported Standing balance-Leahy Scale: Fair Standing balance comment: Fair standing balance, and presented as anxious when sit<>stand and pivoted into BS chair                            Cognition Arousal/Alertness: Awake/alert Behavior During Therapy: WFL for tasks assessed/performed Overall Cognitive Status: Within Functional Limits for tasks assessed  General Comments: He presents as anxious, but pleasant and cooperative. Discussed the importance of being OOB to [facility] chair      Exercises General Exercises - Lower Extremity Ankle Circles/Pumps: AROM;Seated Gluteal Sets: AROM;Seated Short Arc Quad: AROM;Seated Hip ABduction/ADduction: AROM;Seated Other Exercises Other Exercises:  Practiced pursed lip breathing Other Exercises: As mentioned for LE and reminded to perform same in bed and BS chair    General Comments General comments (skin integrity, edema, etc.): Monitor skin and joint integrity. Has IV pole, urinary catheter, and a urinal is being kept at bedside for SPUTUM.      Pertinent Vitals/Pain Pain Assessment: No/denies pain    Home Living                      Prior Function            PT Goals (current goals can now be found in the care plan section) Acute Rehab PT Goals Patient Stated Goal: wants to get home as soon as feasible PT Goal Formulation: With patient Time For Goal Achievement: 09/28/19 Potential to Achieve Goals: Good Progress towards PT goals: Progressing toward goals    Frequency    Min 3X/week      PT Plan      Co-evaluation              AM-PAC PT "6 Clicks" Mobility   Outcome Measure  Help needed turning from your back to your side while in a flat bed without using bedrails?: A Little Help needed moving from lying on your back to sitting on the side of a flat bed without using bedrails?: A Little Help needed moving to and from a bed to a chair (including a wheelchair)?: A Little Help needed standing up from a chair using your arms (e.g., wheelchair or bedside chair)?: A Little Help needed to walk in hospital room?: A Lot Help needed climbing 3-5 steps with a railing? : A Lot 6 Click Score: 16    End of Session   Activity Tolerance: Patient limited by fatigue Patient left: in chair;with call bell/phone within reach;with bed alarm set   PT Visit Diagnosis: Difficulty in walking, not elsewhere classified (R26.2);Muscle weakness (generalized) (M62.81)     Time: 1240-1313 PT Time Calculation (min) (ACUTE ONLY): 33 min  Charges:  $Therapeutic Exercise: 8-22 mins $Therapeutic Activity: 8-22 mins                    Rollen Sox, PT # [phone] CGV cell  Casandra Doffing 09/17/2019, 2:20 PM

## 2019-09-18 ENCOUNTER — Encounter (HOSPITAL_COMMUNITY): Payer: Self-pay | Admitting: Internal Medicine

## 2019-09-18 DIAGNOSIS — I493 Ventricular premature depolarization: Secondary | ICD-10-CM

## 2019-09-18 DIAGNOSIS — I77810 Thoracic aortic ectasia: Secondary | ICD-10-CM

## 2019-09-18 LAB — ACID FAST SMEAR (AFB, MYCOBACTERIA): Acid Fast Smear: NEGATIVE

## 2019-09-18 MED ORDER — RIVAROXABAN (XARELTO) VTE STARTER PACK (15 & 20 MG): ORAL_TABLET | ORAL | Status: DC

## 2019-09-18 MED ORDER — METOPROLOL TARTRATE 25 MG PO TABS: 12.5000 mg | ORAL_TABLET | Freq: Two times a day (BID) | ORAL | 0 refills | Status: DC

## 2019-09-18 NOTE — Discharge Instructions (Signed)
Joe Cantu,  You were in the hospital because of coughing up blood. This is in the setting of using a blood thinner and your pulmonary embolisms. No source could be found but your bleeding improved. Please follow-up with your PCP and the lung doctor.

## 2019-09-18 NOTE — Progress Notes (Signed)
Occupational Therapy Treatment Patient Details Name: Joe Cantu MRN: [id number] DOB: 05/04/1951 Today's Date: 09/18/2019    History of present illness Joe Cantu is a 69 y.o. male with medical history significant of frequent PVCs and aortic root dilation and admitted for syncopal event . CTA of the chest was significant for a massive PE with complete occlusion of the right main artery with absent arterial blood flow to right lung in addition to partially occlusive filling defects of left lower lobe pulmonary arteries; also evidence of significant right heart strain with associated right heart dilation.  Pt admitted for treatment of acute massive PE and left lower extremity deep vein thrombosis with significant right heart dysfunction   OT comments  Pt agreeable to OOB with OT.  Follow Up Recommendations  Home health OT;Supervision/Assistance - 24 hour    Equipment Recommendations  3 in 1 bedside commode    Recommendations for Other Services      Precautions / Restrictions Precautions Precautions: Fall Precaution Comments: He presents with reduced safety awareness and insight into current medical situation Restrictions Weight Bearing Restrictions: No       Mobility Bed Mobility Overal bed mobility: Needs Assistance Bed Mobility: Supine to Sit     Supine to sit: Min guard        Transfers Overall transfer level: Needs assistance Equipment used: 1 person hand held assist(bed to BS chair) Transfers: Sit to/from Stand Sit to Stand: Min guard Stand pivot transfers: Min assist       General transfer comment: Cues for hand placement with the use of RW    Balance Overall balance assessment: Mild deficits observed, not formally tested Sitting-balance support: Bilateral upper extremity supported Sitting balance-Leahy Scale: Fair     Standing balance support: No upper extremity supported Standing balance-Leahy Scale: Fair Standing balance comment: Fair standing  balance, and presented as anxious when sit<>stand and pivoted into BS chair                           ADL either performed or assessed with clinical judgement   ADL Overall ADL's : Needs assistance/impaired Eating/Feeding: Modified independent;Sitting Eating/Feeding Details (indicate cue type and reason): in chair Grooming: Wash/dry face;Wash/dry hands;Oral care;Sitting;Set up Grooming Details (indicate cue type and reason): in chair                 Toilet Transfer: Cueing for safety;Cueing for sequencing;Minimal assistance;RW;Stand-pivot;BSC   Toileting- Clothing Manipulation and Hygiene: Minimal assistance;Sit to/from stand;Cueing for safety;Cueing for compensatory techniques;Cueing for sequencing         General ADL Comments: pt agreeable to OOB with OT.     Vision Baseline Vision/History: Wears glasses Patient Visual Report: No change from baseline            Cognition Arousal/Alertness: Awake/alert Behavior During Therapy: WFL for tasks assessed/performed Overall Cognitive Status: Within Functional Limits for tasks assessed                                                     Pertinent Vitals/ Pain       Pain Assessment: No/denies pain         Frequency  Min 2X/week        Progress Toward Goals  OT Goals(current goals can now be found in the  care plan section)  Progress towards OT goals: Progressing toward goals     Plan Discharge plan remains appropriate       AM-PAC OT "6 Clicks" Daily Activity     Outcome Measure   Help from another person eating meals?: None Help from another person taking care of personal grooming?: A Little Help from another person toileting, which includes using toliet, bedpan, or urinal?: A Little Help from another person bathing (including washing, rinsing, drying)?: A Little Help from another person to put on and taking off regular upper body clothing?: A Little Help from another  person to put on and taking off regular lower body clothing?: A Little 6 Click Score: 19    End of Session Equipment Utilized During Treatment: Rolling walker  OT Visit Diagnosis: Unsteadiness on feet (R26.81);Muscle weakness (generalized) (M62.81);History of falling (Z91.81)   Activity Tolerance Patient tolerated treatment well   Patient Left in chair;with call bell/phone within reach;with chair alarm set   Nurse Communication Mobility status        Time: 1225-8346 OT Time Calculation (min): 12 min  Charges: OT General Charges $OT Visit: 1 Visit OT Treatments $Self Care/Home Management : 8-22 mins  Lise Auer, OT Acute Rehabilitation Services [phone] Office- [phone]      Gabreil [place], Karin Golden D 09/18/2019, 11:38 AM

## 2019-09-18 NOTE — Progress Notes (Signed)
NAME:  Joe Cantu, MRN:  [id number], DOB:  12-18-1950, LOS: 8 ADMISSION DATE:  09/10/2019, CONSULTATION DATE:  09/09/2018 REFERRING MD:  Tyler Pita MD, CHIEF COMPLAINT:  PE, Hemoptysis  Brief History   Consult for hemoptysis Hospitalized after syncopal episode and was found to have massive bilateral PE with right heart strain. Received catheter directed TPA on 2/17 and IVC filter was placed on 2/18. Patient experienced bleeding at groins site during TPA administration requiring throbin pad and sand bag placement.  Patient was initially placed on heparin drip, subsequently transitioned to Xarelto.  Hospital follow up with NP Volanda Napoleon  2/26 on room air no cp, hemoptysis then acute onset hemoptysis p eating lunch ? Gagged?  on day of admit x ? 150cc of dark blood to brought to ER where no drop in hgb and no acute change on cxr and PCCM service asked to consult. Note w/u also c/w ?liver dz with INR elevation.  Note pt denies any increase sob with onset of hemoptysis, also no cp, no sob over baseline, no assoc epistaxis. Has not had any recurrent bleeding since arrival with mostly dry cough.  Past Medical History    has a past medical history of Aortic root dilatation ([place]) (02/21/2019), Dyspnea on exertion, Frequent unifocal PVCs (02/21/2019), Glaucoma, Gonorrhea, Left inguinal hernia, Pneumonia, Ventricular premature beats, and Wears dentures.  Significant Hospital Events   2/28 Admit  Consults:  PCCM, hematology  Procedures:    Significant Diagnostic Tests:  Right upper quadrant ultrasound 09/10/2019- Normal study  CTA 09/11/2019-PEs which are slightly decreased in size.  Mild bibasal atelectasis/consolidation.  Echocardiogram 09/11/2019-EF 40-08%, grade 1 diastolic dysfunction edema overload, moderate pulmonary hypertension, RVSP 56.4. (RVSP was 76 on 2/17)  Micro Data:  Blood culture 2/28 >> E. coli  Antimicrobials:  Zosyn 2/28 >> 3/3   Interim history/subjective:  Hemoptysis has  resolved. Feels well, ready to go home.  Objective   Blood pressure 115/90, pulse 79, temperature 98.1 F (36.7 C), temperature source Oral, resp. rate 18, height 6\' 3"  (1.905 m), weight 77.3 kg, SpO2 99 %.        Intake/Output Summary (Last 24 hours) at 09/18/2019 1219 Last data filed at 09/18/2019 1041 Gross per 24 hour  Intake 720 ml  Output 2925 ml  Net -2205 ml   Filed Weights   09/10/19 2054 09/12/19 0846 09/15/19 1115  Weight: 77.3 kg 77.3 kg 77.3 kg    Examination: Today's Vitals   09/17/19 2000 09/17/19 2048 09/18/19 0614 09/18/19 0725  BP:  118/81 115/90   Pulse:  84 79   Resp:  17 18   Temp:  (!) 97.3 F (36.3 C) 98.1 F (36.7 C)   TempSrc:  Oral Oral   SpO2:  99% 99%   Weight:      Height:      PainSc: 0-No pain   0-No pain   Body mass index is 21.3 kg/m.  Physical exam  General: Thin middle aged male sitting up in bed , in NAD HEENT: MMM, trachea midline Neuro: Moves ext to command, good strength CV: RRR, ext warm PULM:  Clear to ascultation bilaterally, no accessory muscle use GI: soft, +BS Extremities: warm/dry, no edema  Skin: no rashes or lesions  Resolved Hospital Problem list     Assessment & Plan:  Hemoptysis in the setting of anticoagulation- resolved - No source found on bronch/EGD/nares exam  P: Continue reduced dose xarelto OP f/u has been arranged with Dr. Vaughan Browner Call if any  further questions or concerns  Myrla Halsted MD PCCM

## 2019-09-18 NOTE — TOC Progression Note (Signed)
Transition of Care [facility]) - Progression Note    Patient Details  Name: JUNAID WURZER MRN: [id number] Date of Birth: 1951/05/21  Transition of Care [facility]) CM/SW Contact  Armanda Heritage, RN Phone Number: 09/18/2019, 2:20 PM  Clinical Narrative:    CM received notification that [facility] is unable to provide services to patient.  CM set patient up with [facility] for HHPT/OT.   Expected Discharge Plan: Home w Home Health Services Barriers to Discharge: No Barriers Identified  Expected Discharge Plan and Services Expected Discharge Plan: Home w Home Health Services   Discharge Planning Services: CM Consult Post Acute Care Choice: Home Health Living arrangements for the past 2 months: Single Family Home Expected Discharge Date: 09/18/19               DME Arranged: Dan Humphreys rolling DME Agency: [employer] Date DME Agency Contacted: 09/18/19 Time DME Agency Contacted: [phone] Representative spoke with at DME Agency: Ian Malkin HH Arranged: PT, OT HH Agency: [facility] Date [facility] Agency Contacted: 09/18/19 Time HH Agency Contacted: [phone] Representative spoke with at [facility] Agency: Denyse Amass   Social Determinants of Health (SDOH) Interventions    Readmission Risk Interventions No flowsheet data found.

## 2019-09-18 NOTE — TOC Progression Note (Signed)
Transition of Care [facility]) - Progression Note    Patient Details  Name: Joe Cantu MRN: [id number] Date of Birth: 11-11-1950  Transition of Care [facility]) CM/SW Contact  Armanda Heritage, RN Phone Number: 09/18/2019, 11:50 AM  Clinical Narrative:    CM spoke with patient. Patient referred to [facility] for HHPT/OT, referral given to rep Amy. [employer] to deliver rolling walker to bedside, patient declines 3in1.  Patient also reports he is going to stay with his fiance, Denyse Amass at [address] [place] [address].      Expected Discharge Plan: Home w Home Health Services Barriers to Discharge: No Barriers Identified  Expected Discharge Plan and Services Expected Discharge Plan: Home w Home Health Services   Discharge Planning Services: CM Consult Post Acute Care Choice: Home Health Living arrangements for the past 2 months: Single Family Home Expected Discharge Date: 09/18/19               DME Arranged: Dan Humphreys rolling DME Agency: [facility] Date DME Agency Contacted: 09/18/19 Time DME Agency Contacted: [phone] Representative spoke with at DME Agency: Ian Malkin HH Arranged: PT, OT HH Agency: [facility] Date HH Agency Contacted: 09/18/19 Time HH Agency Contacted: [phone] Representative spoke with at [facility] Agency: Amy   Social Determinants of Health (SDOH) Interventions    Readmission Risk Interventions No flowsheet data found.

## 2019-09-18 NOTE — Discharge Summary (Signed)
Physician Discharge Summary  [name]:[id number] DOB: [birth year] DOA: 09/10/2019  PCP: Iona Beard, MD  Admit date: 09/10/2019 Discharge date: 09/18/2019  Admitted From: Home Disposition: Home  Recommendations for Outpatient Follow-up:  1. Follow up with PCP in one week 2. Follow up with pulmonology 3. Please obtain CBC in one week 4. Please follow up on the following pending results: None  Home Health: SNF recommending but patient declined. Home health PT/OT on discharge Equipment/Devices: Rolling walker,   Discharge Condition: Stable CODE STATUS: Full code Diet recommendation: Dysphagia 3 diet   Brief/Interim Summary:  Admission HPI written by Joe Krystal Eaton, MD   Chief Complaint:  Coughing episode after lunch with hemoptysis today  HPI: Patient is a 69 year old male with history of frequent PVCs, aortic root dilatation, recently admitted 2/17-2/20/2021 and was found to have syncope with acute massive PE, left lower extremity DVT with significant right heart dysfunction.  Patient received catheter directed TPA, was placed on heparin drip then transitioned to Xarelto, lifelong.  Retrievable IVC filter was placed on 2/10, removable in 3 months. History was obtained from the patient who reported that he was in his baseline health, has chronic shortness of breath since discharge.  He was eating lunch, potato soup and started having coughing episode.  Patient reported that he started coughing of blood, somewhat poor historian with difficulty in quantifying how much.  Per EMS report, there was 300- 600 cc of blood in the juice jug.  States that he is compliant with Xarelto.  He denies any epigastric pain or any prior history of gastric ulcers or varices.  No prior history of GI work-up.  Denies any hematochezia or melena.  At baseline, he does have occasional coughing with white sputum, still has shortness of breath with exertion, fatigue and weakness.  States he is not on  any O2, needs occasionally walker or cane to ambulate.  Denies any worsening orthopnea PND, weight gain, abdominal distention or peripheral edema  Hospital course:  Hemoptysis In setting of recent initiation of anticoagulation and coughing spells. Xarelto initially held and pulmonology consulted. No recurrent hemoptysis and hemoglobin has been stable. Pulmonology performed a bronchoscopy on 3/2 which was significant for no bleeding source. Oncology was consulted for recommendations on Xarelto dose in setting of acute PE (massive) in addition to extensive LE DVT s/p IVC filter and recommendation to decrease dose to Xarelto 15 mg daily to complete currently available prescription followed by reduction to Xarelto 10 mg. Patient with recurrent ?hemoptysis. Discussed with pulmonology with concern this may not be originating from airway; recommended GI consult. GI performed EGD which was negative for bleeding source. BAL without malignant cells. Hemoglobin is stable in setting of continued hemoptysis and eventually hemoptysis resolved while on Xarelto. PT recommending SNF however patient is declining. OT recommending home health. SLP recommending dysphagia 3 diet with thin liquids.  History of massive PE History of left LE DVT Factor V Leiden, heterozygous Patient was on Xarelto 15 mg BID prior to admission. IVC filter also placed prior to admission. Patient's Xarelto dose has been adjusted as mentioned above to Xarelto 15 mg daily followed by Xarelto 10 mg daily. Patient has follow up with hematology scheduled.  Acute transaminitis In setting of massive PE and RV dysfunction. Improved.  Frequent PVCs Continue metoprolol  Prolonged QTc Noted.  Hypokalemia Resolved.  Discharge Diagnoses:  Principal Problem:   Hemoptysis Active Problems:   Aortic root dilatation (HCC)   Frequent unifocal PVCs  Acute massive pulmonary embolism (HCC)   DVT (deep venous thrombosis) ([place])    Transaminitis    Discharge Instructions  Discharge Instructions    Call MD for:  difficulty breathing, headache or visual disturbances   Complete by: As directed    Diet - low sodium heart healthy   Complete by: As directed    Increase activity slowly   Complete by: As directed      Allergies as of 09/18/2019   No Known Allergies     Medication List    TAKE these medications   acetaminophen 325 MG tablet Commonly known as: TYLENOL Take 650 mg by mouth every 6 (six) hours as needed for mild pain or moderate pain.   dorzolamide 2 % ophthalmic solution Commonly known as: TRUSOPT Place 1 drop into both eyes 2 (two) times daily.   metoprolol tartrate 25 MG tablet Commonly known as: LOPRESSOR Take 0.5 tablets (12.5 mg total) by mouth 2 (two) times daily. What changed:   medication strength  how much to take   polyethylene glycol 17 g packet Commonly known as: MIRALAX / GLYCOLAX Take 17 g by mouth daily.   Rivaroxaban 15 & 20 MG Tbpk Take one 15 mg tablets ONCE PER DAY. Once you have completed your 15 mg tablets, please take a HALF TABLET of your 20 mg tablet (10 mg daily) What changed:   additional instructions  Another medication with the same name was removed. Continue taking this medication, and follow the directions you see here.   tamsulosin 0.4 MG Caps capsule Commonly known as: FLOMAX Take 0.4 mg by mouth at bedtime.   timolol 0.5 % ophthalmic solution Commonly known as: TIMOPTIC Place 1 drop into both eyes 2 (two) times daily.   Vyzulta 0.024 % Soln Generic drug: Latanoprostene Bunod Apply 1 drop to eye daily.            Durable Medical Equipment  (From admission, onward)         Start     Ordered   09/18/19 1112  For home use only DME 3 n 1  Once     09/18/19 1111   09/18/19 1111  For home use only DME Walker rolling  Once    Question Answer Comment  Walker: With 5 Inch Wheels   Patient needs a walker to treat with the following  condition Acute pulmonary embolism ([place])      09/18/19 1111         Follow-up Information    Mannam, Praveen, MD Follow up in 4 week(s).   Specialty: Pulmonary Disease Why: we will call you with appt date/time Contact information: [place] [address] [phone]          No Known Allergies  Consultations:  Pulmonology  Gastroenterology   Procedures/Studies: DG Chest 2 View  Result Date: 09/10/2019 CLINICAL DATA:  Hemoptysis.  Recent PE. EXAM: CHEST - 2 VIEW COMPARISON:  08/30/2019 CT FINDINGS: Lateral view degraded by patient arm position. Midline trachea. Mild cardiomegaly. Pulmonary artery prominence. Right hemidiaphragm elevation. No pleural effusion or pneumothorax. Pulmonary interstitial prominence, favored to be due to AP portable technique. Subtle right lower lobe opacity is suspected, likely corresponding to infarct or atelectasis on prior CT. IMPRESSION: Cardiomegaly, without acute disease. Right base pulmonary opacity, similar to on the prior CT. Pulmonary artery enlargement, suggesting pulmonary arterial hypertension. Electronically Signed   By: Abigail Miyamoto M.D.   On: 09/10/2019 17:19   CT ANGIO CHEST  PE W OR WO CONTRAST  Result Date: 09/11/2019 CLINICAL DATA:  History of pulmonary embolus. Hemoptysis. EXAM: CT ANGIOGRAPHY CHEST WITH CONTRAST TECHNIQUE: Multidetector CT imaging of the chest was performed using the standard protocol during bolus administration of intravenous contrast. Multiplanar CT image reconstructions and MIPs were obtained to evaluate the vascular anatomy. CONTRAST:  153m OMNIPAQUE IOHEXOL 350 MG/ML SOLN COMPARISON:  August 30, 2019. FINDINGS: Cardiovascular: There remains large filling defects in the main portions of both pulmonary arteries which may be slightly decreased compared to prior exam. Mild cardiomegaly is noted. No pericardial effusion is noted. Atherosclerosis of thoracic aorta is noted without aneurysm or  dissection. Mediastinum/Nodes: No enlarged mediastinal, hilar, or axillary lymph nodes. Thyroid gland, trachea, and esophagus demonstrate no significant findings. Lungs/Pleura: No pneumothorax or pleural effusion is noted. Minimal bilateral posterior basilar subsegmental atelectasis is noted. Upper Abdomen: No acute abnormality. Musculoskeletal: No chest wall abnormality. No acute or significant osseous findings. Review of the MIP images confirms the above findings. IMPRESSION: There remains large bilateral pulmonary emboli in the main portions of the pulmonary arteries which are slightly decreased in size compared to prior exam. Aortic Atherosclerosis (ICD10-I70.0). Electronically Signed   By: JMarijo ConceptionM.D.   On: 09/11/2019 10:34   CT Angio Chest PE W and/or Wo Contrast  Result Date: 08/30/2019 CLINICAL DATA:  Syncopal episode while using the bathroom. EXAM: CT ANGIOGRAPHY CHEST WITH CONTRAST TECHNIQUE: Multidetector CT imaging of the chest was performed using the standard protocol during bolus administration of intravenous contrast. Multiplanar CT image reconstructions and MIPs were obtained to evaluate the vascular anatomy. CONTRAST:  1043mOMNIPAQUE IOHEXOL 350 MG/ML SOLN COMPARISON:  None. FINDINGS: Cardiovascular: Massive bilateral pulmonary arterial filling defects with near complete occlusion of the right pulmonary artery, no pulmonary arterial blood flow noted to the right lung. Large thrombus in the left lower lobe pulmonary artery which is partially occlusive. Marked right heart dilatation with contrast refluxing into the hepatic veins and IVC. Elevated RV to LV ratio of 2.16. Aortic atherosclerosis. Cannot assess for dissection given phase of contrast. Mild dilatation of the aortic root at 3.6 cm without aortic aneurysm. No pericardial effusion. Mediastinum/Nodes: No adenopathy. No esophageal wall thickening. No suspicious thyroid nodule. Lungs/Pleura: Triangular subpleural opacities in the  right upper and lower lobes likely pulmonary infarct. Diminished pulmonary vessels in the right lung related to pulmonary embolus. Subpleural opacities in the left lower lobe favor atelectasis. No pulmonary mass. No pulmonary edema. Upper Abdomen: Contrast refluxes into the hepatic veins and IVC. No other acute findings. Musculoskeletal: There are no acute or suspicious osseous abnormalities. Few bone islands in the right proximal humerus. Review of the MIP images confirms the above findings. IMPRESSION: 1. Massive pulmonary emboli with complete occlusion of the right main pulmonary artery and absent pulmonary arterial blood flow to the right lung. Large but partially occlusive filling defects in the left lower lobe pulmonary arteries. Significant right heart strain with RV to LV ratio of greater than 2, right heart dilatation and contrast refluxing into the hepatic veins and IVC. 2. Small pulmonary infarcts in the right upper and lower lobe. Critical Value/emergent results were called by telephone at the time of interpretation on 08/30/2019 at 6:24 am to Dr PEAddison Lank who verbally acknowledged these results. Electronically Signed   By: MeKeith Rake.D.   On: 08/30/2019 06:30   IR Angiogram Pulmonary Bilateral Selective  Result Date: 08/31/2019 INDICATION: Sub massive pulmonary embolism. Request made for initiation of bilateral catheter  directed pulmonary arterial thrombolysis. EXAM: 1. ULTRASOUND GUIDANCE FOR VENOUS ACCESS X2 2. PULMONARY ARTERIOGRAPHY 3. FLUOROSCOPIC GUIDED PLACEMENT OF BILATERAL PULMONARY ARTERIAL LYTIC INFUSION CATHETERS COMPARISON:  Chest CTA-earlier same day MEDICATIONS: None ANESTHESIA/SEDATION: Moderate (conscious) sedation was employed during this procedure. A total of Versed 2.5 mg and Fentanyl 100 mcg was administered intravenously. Moderate Sedation Time: 33 minutes. The patient's level of consciousness and vital signs were monitored continuously by radiology nursing  throughout the procedure under my direct supervision. CONTRAST:  44m OMNIPAQUE IOHEXOL 300 MG/ML  SOLN FLUOROSCOPY TIME:  9 minutes, 18 seconds (1676mGy) COMPLICATIONS: None immediate. TECHNIQUE: Informed written consent was obtained from the patient after a discussion of the risks, benefits and alternatives to treatment. Questions regarding the procedure were encouraged and answered. A timeout was performed prior to the initiation of the procedure. Ultrasound scanning was performed of the right groin and demonstrated near occlusive thrombus within the right common femoral vein. Sonographic evaluation the left groin demonstrates wide patency of the left common femoral vein. As such, the left common femoral vein was selected venous access. The left groin was prepped and draped in the usual sterile fashion, and a sterile drape was applied covering the operative field. Maximum barrier sterile technique with sterile gowns and gloves were used for the procedure. A timeout was performed prior to the initiation of the procedure. Local anesthesia was provided with 1% lidocaine. Under direct ultrasound guidance, the right common femoral vein was accessed with a micro puncture kit ultimately allowing placement of a 6 French, 35 cm vascular sheath. Slightly cranial to this initial access, the right common femoral was again accessed with an additional micropuncture kit ultimately allowing placement of an additional 7 French, 35 cm vascular sheath. Ultrasound images were saved for procedural documentation purposes. With the use of a stiff glidewire, a vertebral catheter was advanced into the main pulmonary artery and a limited central pulmonary arteriogram was performed. Pressure measurements were then obtained from the main pulmonary artery. The vertebral catheter was advanced into the distal branch of the left lower lobe pulmonary artery. Limited contrast injection confirmed appropriate positioning. Over an exchange length  Rosen wire, the vertebral catheter was exchanged for a 90/10 cm multi side-hole infusion catheter. Again, with the use of a stiff glidewire, a vertebral catheter was advanced into a distal branch of the right lower lobe pulmonary artery. Limited contrast injection confirmed appropriate positioning. Over an exchange length Rosen wire, the pigtail catheter was exchanged for a 90/15 cm multi side-hole infusion catheter. A postprocedural fluoroscopic image was obtained to document final catheter positioning. Both vascular sheath were secured at the left groin. The external catheter tubing was secured at the right thigh and the lytic therapy was initiated. The patient tolerated the procedure well without immediate postprocedural complication. FINDINGS: Central pulmonary arteriogram demonstrates significant near occlusive thrombus involving the peripheral aspects of both the right and left main pulmonary arteries with marked dilatation of the central pulmonary arterial system. Additionally, limited right main pulmonary arteriogram demonstrates near occlusive thrombus within the peripheral aspect of the right main pulmonary artery. Acquired pressure measurements: Main  pulmonary artery-61/16; mean-40 (normal: < 25/10) Following the procedure, both ultrasound assisted infusion catheter tips terminate within the distal aspects of the bilateral lower lobe sub segmental pulmonary arteries. IMPRESSION: 1. Successful fluoroscopic guided initiation of bilateral ultrasound assisted catheter directed pulmonary arterial lysis for sub massive pulmonary embolism and right-sided heart strain. 2. Elevated pressure measurements within the main pulmonary artery compatible with critical pulmonary  arterial hypertension. 3. Near occlusive DVT noted within the right common femoral vein. PLAN: Above findings discussed with Dr. Hart Robinsons (critical care) at the time of procedure completion and the decision made to proceed with IVC filter placement  for temporary caval interruption purposes at the time of post lysis pressure measurement acquisition. Electronically Signed   By: Sandi Mariscal M.D.   On: 08/31/2019 08:24   IR IVC FILTER PLMT / S&I Burke Keels GUID/MOD SED  Result Date: 08/31/2019 CLINICAL DATA:  Bilateral submassive pulmonary embolism with cor pulmonale. Status post thrombo lytic infusion via each pulmonary artery for 12 hours. Request has also been made to place an IVC filter on completion thrombo lytic therapy given residual DVT in both lower extremities. EXAM: 1. FINAL DAY ARTERIAL THROMBOLYTIC THERAPY PRESSURE MEASUREMENT. 2. IVC VENOGRAM. 3. PERCUTANEOUS IVC FILTER PLACEMENT. ANESTHESIA/SEDATION: 50 mcg IV Fentanyl. The patient was just given fentanyl and not formally sedated. CONTRAST:  42m OMNIPAQUE IOHEXOL 300 MG/ML SOLN, 167mOMNIPAQUE IOHEXOL 300 MG/ML SOLN, 10107mMNIPAQUE IOHEXOL 300 MG/ML SOLN FLUOROSCOPY TIME:  1 minutes and 30 seconds.  146.6 mGy. PROCEDURE: The procedure, risks, benefits, and alternatives were explained to the patient. Questions regarding the procedure were encouraged and answered. The patient understands and consents to the procedure. A time-out was performed prior to initiating the procedure. Pulmonary artery pressures were directly measured through the pulmonary arterial infusion catheters. The catheters were then removed. Left femoral vein sheaths placed yesterday were prepped and draped. Local anesthesia was provided with 1% Lidocaine. A guidewire was advanced into the inferior vena cava via one of the sheaths. This sheath was removed and the venotomy dilated. A 10 French deployment sheath was advanced over the guidewire. This was utilized to perform IVC venography. The deployment sheath was further positioned in an appropriate location for filter deployment. A Bard Denali IVC filter was then advanced in the sheath. This was then fully deployed in the infrarenal IVC. Final filter position was confirmed with a  fluoroscopic spot image. After the procedure the sheath was removed and hemostasis obtained with manual compression. COMPLICATIONS: None. FINDINGS: Measure pulmonary artery pressure is 55/20, mean 28 mm Hg compared to 61/16, 40 mm Hg prior to lytic therapy. IVC venography demonstrates a normal caliber IVC with no evidence of thrombus. Renal veins are identified bilaterally. The IVC filter was successfully positioned below the level of the renal veins and is appropriately oriented. This IVC filter has both permanent and retrievable indications. IMPRESSION: 1. Decrease in measured pulmonary artery pressure after 12 hours of bilateral pulmonary arterial thrombolytic therapy. 2. Placement of percutaneous IVC filter in infrarenal IVC. IVC venogram shows no evidence of IVC thrombus and normal caliber of the inferior vena cava. This filter does have both permanent and retrievable indications. PLAN: This IVC filter is potentially retrievable. The patient will be assessed for filter retrieval by Interventional Radiology in approximately 8-12 weeks. Further recommendations regarding filter retrieval, continued surveillance or declaration of device permanence, will be made at that time. Electronically Signed   By: GleAletta EdouardD.   On: 08/31/2019 14:25   IR [place] Vasc Access Left  Result Date: 08/31/2019 INDICATION: Sub massive pulmonary embolism. Request made for initiation of bilateral catheter directed pulmonary arterial thrombolysis. EXAM: 1. ULTRASOUND GUIDANCE FOR VENOUS ACCESS X2 2. PULMONARY ARTERIOGRAPHY 3. FLUOROSCOPIC GUIDED PLACEMENT OF BILATERAL PULMONARY ARTERIAL LYTIC INFUSION CATHETERS COMPARISON:  Chest CTA-earlier same day MEDICATIONS: None ANESTHESIA/SEDATION: Moderate (conscious) sedation was employed during this procedure. A total of Versed 2.5 mg  and Fentanyl 100 mcg was administered intravenously. Moderate Sedation Time: 33 minutes. The patient's level of consciousness and vital signs were  monitored continuously by radiology nursing throughout the procedure under my direct supervision. CONTRAST:  83m OMNIPAQUE IOHEXOL 300 MG/ML  SOLN FLUOROSCOPY TIME:  9 minutes, 18 seconds (1264mGy) COMPLICATIONS: None immediate. TECHNIQUE: Informed written consent was obtained from the patient after a discussion of the risks, benefits and alternatives to treatment. Questions regarding the procedure were encouraged and answered. A timeout was performed prior to the initiation of the procedure. Ultrasound scanning was performed of the right groin and demonstrated near occlusive thrombus within the right common femoral vein. Sonographic evaluation the left groin demonstrates wide patency of the left common femoral vein. As such, the left common femoral vein was selected venous access. The left groin was prepped and draped in the usual sterile fashion, and a sterile drape was applied covering the operative field. Maximum barrier sterile technique with sterile gowns and gloves were used for the procedure. A timeout was performed prior to the initiation of the procedure. Local anesthesia was provided with 1% lidocaine. Under direct ultrasound guidance, the right common femoral vein was accessed with a micro puncture kit ultimately allowing placement of a 6 French, 35 cm vascular sheath. Slightly cranial to this initial access, the right common femoral was again accessed with an additional micropuncture kit ultimately allowing placement of an additional 7 French, 35 cm vascular sheath. Ultrasound images were saved for procedural documentation purposes. With the use of a stiff glidewire, a vertebral catheter was advanced into the main pulmonary artery and a limited central pulmonary arteriogram was performed. Pressure measurements were then obtained from the main pulmonary artery. The vertebral catheter was advanced into the distal branch of the left lower lobe pulmonary artery. Limited contrast injection confirmed  appropriate positioning. Over an exchange length Rosen wire, the vertebral catheter was exchanged for a 90/10 cm multi side-hole infusion catheter. Again, with the use of a stiff glidewire, a vertebral catheter was advanced into a distal branch of the right lower lobe pulmonary artery. Limited contrast injection confirmed appropriate positioning. Over an exchange length Rosen wire, the pigtail catheter was exchanged for a 90/15 cm multi side-hole infusion catheter. A postprocedural fluoroscopic image was obtained to document final catheter positioning. Both vascular sheath were secured at the left groin. The external catheter tubing was secured at the right thigh and the lytic therapy was initiated. The patient tolerated the procedure well without immediate postprocedural complication. FINDINGS: Central pulmonary arteriogram demonstrates significant near occlusive thrombus involving the peripheral aspects of both the right and left main pulmonary arteries with marked dilatation of the central pulmonary arterial system. Additionally, limited right main pulmonary arteriogram demonstrates near occlusive thrombus within the peripheral aspect of the right main pulmonary artery. Acquired pressure measurements: Main  pulmonary artery-61/16; mean-40 (normal: < 25/10) Following the procedure, both ultrasound assisted infusion catheter tips terminate within the distal aspects of the bilateral lower lobe sub segmental pulmonary arteries. IMPRESSION: 1. Successful fluoroscopic guided initiation of bilateral ultrasound assisted catheter directed pulmonary arterial lysis for sub massive pulmonary embolism and right-sided heart strain. 2. Elevated pressure measurements within the main pulmonary artery compatible with critical pulmonary arterial hypertension. 3. Near occlusive DVT noted within the right common femoral vein. PLAN: Above findings discussed with Dr. PHart Robinsons(critical care) at the time of procedure completion and the  decision made to proceed with IVC filter placement for temporary caval interruption purposes at the time of post  lysis pressure measurement acquisition. Electronically Signed   By: Sandi Mariscal M.D.   On: 08/31/2019 08:24   IR US Guide Vasc Access Left  Result Date: 08/31/2019 INDICATION: Sub massive pulmonary embolism. Request made for initiation of bilateral catheter directed pulmonary arterial thrombolysis. EXAM: 1. ULTRASOUND GUIDANCE FOR VENOUS ACCESS X2 2. PULMONARY ARTERIOGRAPHY 3. FLUOROSCOPIC GUIDED PLACEMENT OF BILATERAL PULMONARY ARTERIAL LYTIC INFUSION CATHETERS COMPARISON:  Chest CTA-earlier same day MEDICATIONS: None ANESTHESIA/SEDATION: Moderate (conscious) sedation was employed during this procedure. A total of Versed 2.5 mg and Fentanyl 100 mcg was administered intravenously. Moderate Sedation Time: 33 minutes. The patient's level of consciousness and vital signs were monitored continuously by radiology nursing throughout the procedure under my direct supervision. CONTRAST:  57m OMNIPAQUE IOHEXOL 300 MG/ML  SOLN FLUOROSCOPY TIME:  9 minutes, 18 seconds (1235mGy) COMPLICATIONS: None immediate. TECHNIQUE: Informed written consent was obtained from the patient after a discussion of the risks, benefits and alternatives to treatment. Questions regarding the procedure were encouraged and answered. A timeout was performed prior to the initiation of the procedure. Ultrasound scanning was performed of the right groin and demonstrated near occlusive thrombus within the right common femoral vein. Sonographic evaluation the left groin demonstrates wide patency of the left common femoral vein. As such, the left common femoral vein was selected venous access. The left groin was prepped and draped in the usual sterile fashion, and a sterile drape was applied covering the operative field. Maximum barrier sterile technique with sterile gowns and gloves were used for the procedure. A timeout was performed prior  to the initiation of the procedure. Local anesthesia was provided with 1% lidocaine. Under direct ultrasound guidance, the right common femoral vein was accessed with a micro puncture kit ultimately allowing placement of a 6 French, 35 cm vascular sheath. Slightly cranial to this initial access, the right common femoral was again accessed with an additional micropuncture kit ultimately allowing placement of an additional 7 French, 35 cm vascular sheath. Ultrasound images were saved for procedural documentation purposes. With the use of a stiff glidewire, a vertebral catheter was advanced into the main pulmonary artery and a limited central pulmonary arteriogram was performed. Pressure measurements were then obtained from the main pulmonary artery. The vertebral catheter was advanced into the distal branch of the left lower lobe pulmonary artery. Limited contrast injection confirmed appropriate positioning. Over an exchange length Rosen wire, the vertebral catheter was exchanged for a 90/10 cm multi side-hole infusion catheter. Again, with the use of a stiff glidewire, a vertebral catheter was advanced into a distal branch of the right lower lobe pulmonary artery. Limited contrast injection confirmed appropriate positioning. Over an exchange length Rosen wire, the pigtail catheter was exchanged for a 90/15 cm multi side-hole infusion catheter. A postprocedural fluoroscopic image was obtained to document final catheter positioning. Both vascular sheath were secured at the left groin. The external catheter tubing was secured at the right thigh and the lytic therapy was initiated. The patient tolerated the procedure well without immediate postprocedural complication. FINDINGS: Central pulmonary arteriogram demonstrates significant near occlusive thrombus involving the peripheral aspects of both the right and left main pulmonary arteries with marked dilatation of the central pulmonary arterial system. Additionally,  limited right main pulmonary arteriogram demonstrates near occlusive thrombus within the peripheral aspect of the right main pulmonary artery. Acquired pressure measurements: Main  pulmonary artery-61/16; mean-40 (normal: < 25/10) Following the procedure, both ultrasound assisted infusion catheter tips terminate within the distal aspects of the bilateral lower  lobe sub segmental pulmonary arteries. IMPRESSION: 1. Successful fluoroscopic guided initiation of bilateral ultrasound assisted catheter directed pulmonary arterial lysis for sub massive pulmonary embolism and right-sided heart strain. 2. Elevated pressure measurements within the main pulmonary artery compatible with critical pulmonary arterial hypertension. 3. Near occlusive DVT noted within the right common femoral vein. PLAN: Above findings discussed with Dr. Hart Robinsons (critical care) at the time of procedure completion and the decision made to proceed with IVC filter placement for temporary caval interruption purposes at the time of post lysis pressure measurement acquisition. Electronically Signed   By: Sandi Mariscal M.D.   On: 08/31/2019 08:24   DG CHEST PORT 1 VIEW  Result Date: 09/13/2019 CLINICAL DATA:  Respiratory failure EXAM: PORTABLE CHEST 1 VIEW COMPARISON:  September 11, 2019 FINDINGS: The heart size and mediastinal contours are within normal limits. Minimal subsegmental atelectasis seen at both lung bases. The visualized skeletal structures are unremarkable. IMPRESSION: Minimal bibasilar subsegmental atelectasis. Electronically Signed   By: Prudencio Pair M.D.   On: 09/13/2019 05:35   ECHOCARDIOGRAM COMPLETE  Result Date: 09/11/2019    ECHOCARDIOGRAM REPORT   Patient Name:   Joe Cantu Date of Exam: 09/11/2019 Medical Rec #:  [id number]     Height:       75.0 in Accession #:    [id number]    Weight:       170.4 lb Date of Birth:  02-09-1951     BSA:          2.050 m Patient Age:    69 years      BP:           116/79 mmHg Patient Gender: M              HR:           77 bpm. Exam Location:  Inpatient Procedure: 2D Echo, Color Doppler and Cardiac Doppler Indications:    I50.9* Heart failure (unspecified)  History:        Patient has prior history of Echocardiogram examinations, most                 recent 08/30/2019. CHF. Acute Massive Pulmonary Embolus on                 08/30/19.  Sonographer:    Raquel Sarna Senior RDCS Referring Phys: [id number] Joe K RAI IMPRESSIONS  1. Left ventricular ejection fraction, by estimation, is 50 to 55%. The left ventricle has low normal function. The left ventricle has no regional wall motion abnormalities. Left ventricular diastolic parameters are consistent with Grade I diastolic dysfunction (impaired relaxation). There is the interventricular septum is flattened in diastole ('D' shaped left ventricle), consistent with right ventricular volume overload.  2. Right ventricular systolic function is normal. The right ventricular size is moderately enlarged. There is moderately elevated pulmonary artery systolic pressure. The estimated right ventricular systolic pressure is 88.8 mmHg.  3. Right atrial size was moderately dilated.  4. The mitral valve is normal in structure and function. Trivial mitral valve regurgitation. No evidence of mitral stenosis.  5. The aortic valve is normal in structure and function. Aortic valve regurgitation is not visualized. No aortic stenosis is present.  6. Aortic dilatation noted. There is mild dilatation of the ascending aorta measuring 37 mm. FINDINGS  Left Ventricle: Left ventricular ejection fraction, by estimation, is 50 to 55%. The left ventricle has low normal function. The left ventricle has no regional wall motion abnormalities. The left ventricular internal  cavity size was normal in size. There is no left ventricular hypertrophy. The interventricular septum is flattened in diastole ('D' shaped left ventricle), consistent with right ventricular volume overload. Left ventricular diastolic parameters  are consistent with Grade I diastolic dysfunction (impaired relaxation). Right Ventricle: The right ventricular size is moderately enlarged. No increase in right ventricular wall thickness. Right ventricular systolic function is normal. There is moderately elevated pulmonary artery systolic pressure. The tricuspid regurgitant  velocity is 3.48 m/s, and with an assumed right atrial pressure of 8 mmHg, the estimated right ventricular systolic pressure is 93.9 mmHg. Left Atrium: Left atrial size was normal in size. Right Atrium: Right atrial size was moderately dilated. Pericardium: There is no evidence of pericardial effusion. Mitral Valve: The mitral valve is normal in structure and function. Trivial mitral valve regurgitation. No evidence of mitral valve stenosis. Tricuspid Valve: The tricuspid valve is normal in structure. Tricuspid valve regurgitation is mild. Aortic Valve: The aortic valve is normal in structure and function. Aortic valve regurgitation is not visualized. No aortic stenosis is present. Pulmonic Valve: The pulmonic valve was normal in structure. Pulmonic valve regurgitation is trivial. Aorta: Aortic dilatation noted. There is mild dilatation of the ascending aorta measuring 37 mm. IAS/Shunts: The atrial septum is grossly normal.  LEFT VENTRICLE PLAX 2D LVIDd:         4.95 cm  Diastology LVIDs:         3.20 cm  LV e' lateral:   9.01 cm/s LV PW:         1.00 cm  LV E/e' lateral: 6.1 LV IVS:        1.02 cm  LV e' medial:    6.68 cm/s LVOT diam:     2.70 cm  LV E/e' medial:  8.3 LV SV:         85 LV SV Index:   42 LVOT Area:     5.73 cm  RIGHT VENTRICLE RV S prime:     13.70 cm/s TAPSE (M-mode): 2.2 cm LEFT ATRIUM             Index       RIGHT ATRIUM           Index LA diam:        3.10 cm 1.51 cm/m  RA Area:     26.00 cm LA Vol (A2C):   48.4 ml 23.61 ml/m RA Volume:   90.70 ml  44.24 ml/m LA Vol (A4C):   68.4 ml 33.36 ml/m LA Biplane Vol: 60.7 ml 29.61 ml/m  AORTIC VALVE LVOT Vmax:   96.60 cm/s  LVOT Vmean:  61.900 cm/s LVOT VTI:    0.149 m                          PULMONARY ARTERY AORTA                    MPA diam:        3.20 cm Ao Root diam: 3.90 cm Ao Asc diam:  3.70 cm MITRAL VALVE               TRICUSPID VALVE MV Area (PHT): 2.79 cm    TR Peak grad:   48.4 mmHg MV Decel Time: 272 msec    TR Vmax:        348.00 cm/s MV E velocity: 55.30 cm/s MV A velocity: 78.40 cm/s  SHUNTS MV E/A ratio:  0.71  Systemic VTI:  0.15 m                            Systemic Diam: 2.70 cm Mertie Moores MD Electronically signed by Mertie Moores MD Signature Date/Time: 09/11/2019/3:55:30 PM    Final    ECHOCARDIOGRAM COMPLETE  Result Date: 08/30/2019    ECHOCARDIOGRAM REPORT   Patient Name:   Joe Cantu Date of Exam: 08/30/2019 Medical Rec #:  [id number]     Height:       75.0 in Accession #:    [id number]    Weight:       172.0 lb Date of Birth:  [birth year]     BSA:          2.06 m Patient Age:    52 years      BP:           122/87 mmHg Patient Gender: M             HR:           98 bpm. Exam Location:  Inpatient Procedure: 2D Echo, Cardiac Doppler and Color Doppler STAT ECHO Indications:    R55 Syncope  History:        Patient has no prior history of Echocardiogram examinations.                 Aortic Rood Dilatation.  Sonographer:    Jonelle Sidle Dance Referring Phys: [id number] [place]  1. Severe RA/RV dilation with reduced RV function and severe pulmonary hypertension. LV hyperdynamic and borderline small cavity/underfilled. No hemodynamically significant valve disease.  2. Left ventricular ejection fraction, by estimation, is 65 to 70%. The left ventricle has hyperdynamic function. The left ventricle has no regional wall motion abnormalities. There is moderate concentric left ventricular hypertrophy. Left ventricular diastolic function could not be evaluated. There is the interventricular septum is flattened in systole and diastole, consistent with right ventricular pressure and volume  overload.  3. Right ventricular systolic function is moderately reduced. The right ventricular size is severely enlarged. There is severely elevated pulmonary artery systolic pressure. The estimated right ventricular systolic pressure is 30.8 mmHg.  4. Right atrial size was severely dilated.  5. The mitral valve is normal in structure and function. Trivial mitral valve regurgitation. No evidence of mitral stenosis.  6. Tricuspid valve regurgitation is moderate.  7. The aortic valve is tricuspid. Aortic valve regurgitation is trivial. No aortic stenosis is present.  8. Aortic dilatation noted. There is mild dilatation of the ascending aorta measuring 37 mm.  9. Mildly dilated pulmonary artery. 10. The inferior vena cava is dilated in size with <50% respiratory variability, suggesting right atrial pressure of 15 mmHg. 11. Cannot exclude IAS shunt. Comparison(s): No prior Echocardiogram. FINDINGS  Left Ventricle: Left ventricular ejection fraction, by estimation, is 65 to 70%. The left ventricle has hyperdynamic function. The left ventricle has no regional wall motion abnormalities. The left ventricular internal cavity size was small. There is moderate concentric left ventricular hypertrophy. The interventricular septum is flattened in systole and diastole, consistent with right ventricular pressure and volume overload. Left ventricular diastolic function could not be evaluated. Right Ventricle: The right ventricular size is severely enlarged. Right vetricular wall thickness was not assessed. Right ventricular systolic function is moderately reduced. There is severely elevated pulmonary artery systolic pressure. The tricuspid regurgitant velocity is 3.91 m/s, and with an assumed right atrial pressure of 15 mmHg, the  estimated right ventricular systolic pressure is 03.4 mmHg. Left Atrium: Left atrial size was normal in size. Right Atrium: Right atrial size was severely dilated. Pericardium: There is no evidence of  pericardial effusion. Mitral Valve: The mitral valve is normal in structure and function. Trivial mitral valve regurgitation. No evidence of mitral valve stenosis. Tricuspid Valve: The tricuspid valve is normal in structure. Tricuspid valve regurgitation is moderate . No evidence of tricuspid stenosis. Aortic Valve: The aortic valve is tricuspid. Aortic valve regurgitation is trivial. No aortic stenosis is present. Pulmonic Valve: The pulmonic valve was grossly normal. Pulmonic valve regurgitation is mild. No evidence of pulmonic stenosis. Aorta: Aortic dilatation noted. There is mild dilatation of the ascending aorta measuring 37 mm. Pulmonary Artery: The pulmonary artery is mildly dilated. Venous: The inferior vena cava is dilated in size with less than 50% respiratory variability, suggesting right atrial pressure of 15 mmHg. IAS/Shunts: Cannot exclude IAS shunt. Additional Comments: There is a small pleural effusion in both left and right lateral regions.  LEFT VENTRICLE PLAX 2D LVIDd:         3.33 cm LVIDs:         2.20 cm LV PW:         1.75 cm LV IVS:        2.02 cm LVOT diam:     2.40 cm LV SV:         72.61 ml LV SV Index:   14.28 LVOT Area:     4.52 cm  RIGHT VENTRICLE          IVC RV Basal diam:  4.20 cm  IVC diam: 2.20 cm RV Mid diam:    3.00 cm TAPSE (M-mode): 1.4 cm LEFT ATRIUM             Index       RIGHT ATRIUM           Index LA diam:        3.40 cm 1.65 cm/m  RA Area:     39.50 cm LA Vol (A2C):   42.1 ml 20.46 ml/m RA Volume:   182.50 ml 88.67 ml/m LA Vol (A4C):   43.0 ml 20.89 ml/m LA Biplane Vol: 42.4 ml 20.60 ml/m  AORTIC VALVE LVOT Vmax:   102.40 cm/s LVOT Vmean:  57.500 cm/s LVOT VTI:    0.160 m  AORTA Ao Root diam: 4.20 cm Ao Asc diam:  3.70 cm MV A velocity: 78.45 cm/s  TRICUSPID VALVE                            TR Peak grad:   61.2 mmHg                            TR Vmax:        391.00 cm/s                             SHUNTS                            Systemic VTI:  0.16 m                             Systemic Diam: 2.40 cm Buford Dresser MD Electronically signed by Buford Dresser MD  Signature Date/Time: 08/30/2019/12:48:37 PM    Final    VAS [place] LOWER EXTREMITY VENOUS (DVT)  Result Date: 08/30/2019  Lower Venous DVTStudy Indications: Pulmonary embolism, and SOB.  Comparison Study: No prior exam. Performing Technologist: Baldwin Crown ARDMS, RVT  Examination Guidelines: A complete evaluation includes B-mode imaging, spectral Doppler, color Doppler, and power Doppler as needed of all accessible portions of each vessel. Bilateral testing is considered an integral part of a complete examination. Limited examinations for reoccurring indications may be performed as noted. The reflux portion of the exam is performed with the patient in reverse Trendelenburg.  +---------+---------------+---------+-----------+----------+---------------+ RIGHT    CompressibilityPhasicitySpontaneityPropertiesThrombus Aging  +---------+---------------+---------+-----------+----------+---------------+ CFV      Partial        Yes      Yes                                  +---------+---------------+---------+-----------+----------+---------------+ SFJ      None                                                         +---------+---------------+---------+-----------+----------+---------------+ FV Prox  Full                                                         +---------+---------------+---------+-----------+----------+---------------+ FV Mid   Full                                                         +---------+---------------+---------+-----------+----------+---------------+ FV DistalFull                                                         +---------+---------------+---------+-----------+----------+---------------+ PFV      Full                                                          +---------+---------------+---------+-----------+----------+---------------+ POP      Full           Yes      Yes                                  +---------+---------------+---------+-----------+----------+---------------+ PTV      Full                                         seen with color +---------+---------------+---------+-----------+----------+---------------+ PERO     Full  seen with color +---------+---------------+---------+-----------+----------+---------------+ EIV      Full           Yes      Yes                                  +---------+---------------+---------+-----------+----------+---------------+   +---------+---------------+---------+-----------+----------+---------------+ LEFT     CompressibilityPhasicitySpontaneityPropertiesThrombus Aging  +---------+---------------+---------+-----------+----------+---------------+ CFV      None           No       No                                   +---------+---------------+---------+-----------+----------+---------------+ SFJ      Full                                                         +---------+---------------+---------+-----------+----------+---------------+ FV Prox  Full                                                         +---------+---------------+---------+-----------+----------+---------------+ FV Mid   Full                                                         +---------+---------------+---------+-----------+----------+---------------+ FV DistalNone                                                         +---------+---------------+---------+-----------+----------+---------------+ PFV      Full                                                         +---------+---------------+---------+-----------+----------+---------------+ POP      None           No       No                                    +---------+---------------+---------+-----------+----------+---------------+ PTV      Full                                         seen with color +---------+---------------+---------+-----------+----------+---------------+ PERO     Full  seen with color +---------+---------------+---------+-----------+----------+---------------+ EIV      Full                                                         +---------+---------------+---------+-----------+----------+---------------+ Poorly visusalized bilteral calf veins, visualized with color flow.    Summary: RIGHT: - Findings consistent with acute deep vein thrombosis involving the SF junction, and right common femoral vein. - Findings consistent with acute superficial vein thrombosis involving the right great saphenous vein.  LEFT: - Findings consistent with acute deep vein thrombosis involving the left common femoral vein, left femoral vein, and left popliteal vein.  *See table(s) above for measurements and observations. Electronically signed by Harold Barban MD on 08/30/2019 at 6:38:23 PM.    Final    US Abdomen Limited RUQ  Result Date: 09/10/2019 CLINICAL DATA:  Transaminitis EXAM: ULTRASOUND ABDOMEN LIMITED RIGHT UPPER QUADRANT COMPARISON:  None. FINDINGS: Gallbladder: No gallstones or wall thickening visualized. No sonographic Murphy sign noted by sonographer. Common bile duct: Diameter: 3.5 mm Liver: No focal lesion identified. Within normal limits in parenchymal echogenicity. Portal vein is patent on color Doppler imaging with normal direction of blood flow towards the liver. Other: None. IMPRESSION: Normal study. Electronically Signed   By: Constance Holster M.D.   On: 09/10/2019 19:29   IR INFUSION THROMBOL ARTERIAL INITIAL (MS)  Result Date: 08/31/2019 INDICATION: Sub massive pulmonary embolism. Request made for initiation of bilateral catheter directed pulmonary arterial thrombolysis. EXAM:  1. ULTRASOUND GUIDANCE FOR VENOUS ACCESS X2 2. PULMONARY ARTERIOGRAPHY 3. FLUOROSCOPIC GUIDED PLACEMENT OF BILATERAL PULMONARY ARTERIAL LYTIC INFUSION CATHETERS COMPARISON:  Chest CTA-earlier same day MEDICATIONS: None ANESTHESIA/SEDATION: Moderate (conscious) sedation was employed during this procedure. A total of Versed 2.5 mg and Fentanyl 100 mcg was administered intravenously. Moderate Sedation Time: 33 minutes. The patient's level of consciousness and vital signs were monitored continuously by radiology nursing throughout the procedure under my direct supervision. CONTRAST:  61m OMNIPAQUE IOHEXOL 300 MG/ML  SOLN FLUOROSCOPY TIME:  9 minutes, 18 seconds (1710mGy) COMPLICATIONS: None immediate. TECHNIQUE: Informed written consent was obtained from the patient after a discussion of the risks, benefits and alternatives to treatment. Questions regarding the procedure were encouraged and answered. A timeout was performed prior to the initiation of the procedure. Ultrasound scanning was performed of the right groin and demonstrated near occlusive thrombus within the right common femoral vein. Sonographic evaluation the left groin demonstrates wide patency of the left common femoral vein. As such, the left common femoral vein was selected venous access. The left groin was prepped and draped in the usual sterile fashion, and a sterile drape was applied covering the operative field. Maximum barrier sterile technique with sterile gowns and gloves were used for the procedure. A timeout was performed prior to the initiation of the procedure. Local anesthesia was provided with 1% lidocaine. Under direct ultrasound guidance, the right common femoral vein was accessed with a micro puncture kit ultimately allowing placement of a 6 French, 35 cm vascular sheath. Slightly cranial to this initial access, the right common femoral was again accessed with an additional micropuncture kit ultimately allowing placement of an additional  7 French, 35 cm vascular sheath. Ultrasound images were saved for procedural documentation purposes. With the use of a stiff glidewire, a vertebral catheter was advanced into the main pulmonary artery  and a limited central pulmonary arteriogram was performed. Pressure measurements were then obtained from the main pulmonary artery. The vertebral catheter was advanced into the distal branch of the left lower lobe pulmonary artery. Limited contrast injection confirmed appropriate positioning. Over an exchange length Rosen wire, the vertebral catheter was exchanged for a 90/10 cm multi side-hole infusion catheter. Again, with the use of a stiff glidewire, a vertebral catheter was advanced into a distal branch of the right lower lobe pulmonary artery. Limited contrast injection confirmed appropriate positioning. Over an exchange length Rosen wire, the pigtail catheter was exchanged for a 90/15 cm multi side-hole infusion catheter. A postprocedural fluoroscopic image was obtained to document final catheter positioning. Both vascular sheath were secured at the left groin. The external catheter tubing was secured at the right thigh and the lytic therapy was initiated. The patient tolerated the procedure well without immediate postprocedural complication. FINDINGS: Central pulmonary arteriogram demonstrates significant near occlusive thrombus involving the peripheral aspects of both the right and left main pulmonary arteries with marked dilatation of the central pulmonary arterial system. Additionally, limited right main pulmonary arteriogram demonstrates near occlusive thrombus within the peripheral aspect of the right main pulmonary artery. Acquired pressure measurements: Main  pulmonary artery-61/16; mean-40 (normal: < 25/10) Following the procedure, both ultrasound assisted infusion catheter tips terminate within the distal aspects of the bilateral lower lobe sub segmental pulmonary arteries. IMPRESSION: 1. Successful  fluoroscopic guided initiation of bilateral ultrasound assisted catheter directed pulmonary arterial lysis for sub massive pulmonary embolism and right-sided heart strain. 2. Elevated pressure measurements within the main pulmonary artery compatible with critical pulmonary arterial hypertension. 3. Near occlusive DVT noted within the right common femoral vein. PLAN: Above findings discussed with Dr. Hart Robinsons (critical care) at the time of procedure completion and the decision made to proceed with IVC filter placement for temporary caval interruption purposes at the time of post lysis pressure measurement acquisition. Electronically Signed   By: Sandi Mariscal M.D.   On: 08/31/2019 08:24   IR INFUSION THROMBOL ARTERIAL INITIAL (MS)  Result Date: 08/31/2019 INDICATION: Sub massive pulmonary embolism. Request made for initiation of bilateral catheter directed pulmonary arterial thrombolysis. EXAM: 1. ULTRASOUND GUIDANCE FOR VENOUS ACCESS X2 2. PULMONARY ARTERIOGRAPHY 3. FLUOROSCOPIC GUIDED PLACEMENT OF BILATERAL PULMONARY ARTERIAL LYTIC INFUSION CATHETERS COMPARISON:  Chest CTA-earlier same day MEDICATIONS: None ANESTHESIA/SEDATION: Moderate (conscious) sedation was employed during this procedure. A total of Versed 2.5 mg and Fentanyl 100 mcg was administered intravenously. Moderate Sedation Time: 33 minutes. The patient's level of consciousness and vital signs were monitored continuously by radiology nursing throughout the procedure under my direct supervision. CONTRAST:  55m OMNIPAQUE IOHEXOL 300 MG/ML  SOLN FLUOROSCOPY TIME:  9 minutes, 18 seconds (1161mGy) COMPLICATIONS: None immediate. TECHNIQUE: Informed written consent was obtained from the patient after a discussion of the risks, benefits and alternatives to treatment. Questions regarding the procedure were encouraged and answered. A timeout was performed prior to the initiation of the procedure. Ultrasound scanning was performed of the right groin and  demonstrated near occlusive thrombus within the right common femoral vein. Sonographic evaluation the left groin demonstrates wide patency of the left common femoral vein. As such, the left common femoral vein was selected venous access. The left groin was prepped and draped in the usual sterile fashion, and a sterile drape was applied covering the operative field. Maximum barrier sterile technique with sterile gowns and gloves were used for the procedure. A timeout was performed prior to the initiation of the  procedure. Local anesthesia was provided with 1% lidocaine. Under direct ultrasound guidance, the right common femoral vein was accessed with a micro puncture kit ultimately allowing placement of a 6 French, 35 cm vascular sheath. Slightly cranial to this initial access, the right common femoral was again accessed with an additional micropuncture kit ultimately allowing placement of an additional 7 French, 35 cm vascular sheath. Ultrasound images were saved for procedural documentation purposes. With the use of a stiff glidewire, a vertebral catheter was advanced into the main pulmonary artery and a limited central pulmonary arteriogram was performed. Pressure measurements were then obtained from the main pulmonary artery. The vertebral catheter was advanced into the distal branch of the left lower lobe pulmonary artery. Limited contrast injection confirmed appropriate positioning. Over an exchange length Rosen wire, the vertebral catheter was exchanged for a 90/10 cm multi side-hole infusion catheter. Again, with the use of a stiff glidewire, a vertebral catheter was advanced into a distal branch of the right lower lobe pulmonary artery. Limited contrast injection confirmed appropriate positioning. Over an exchange length Rosen wire, the pigtail catheter was exchanged for a 90/15 cm multi side-hole infusion catheter. A postprocedural fluoroscopic image was obtained to document final catheter positioning. Both  vascular sheath were secured at the left groin. The external catheter tubing was secured at the right thigh and the lytic therapy was initiated. The patient tolerated the procedure well without immediate postprocedural complication. FINDINGS: Central pulmonary arteriogram demonstrates significant near occlusive thrombus involving the peripheral aspects of both the right and left main pulmonary arteries with marked dilatation of the central pulmonary arterial system. Additionally, limited right main pulmonary arteriogram demonstrates near occlusive thrombus within the peripheral aspect of the right main pulmonary artery. Acquired pressure measurements: Main  pulmonary artery-61/16; mean-40 (normal: < 25/10) Following the procedure, both ultrasound assisted infusion catheter tips terminate within the distal aspects of the bilateral lower lobe sub segmental pulmonary arteries. IMPRESSION: 1. Successful fluoroscopic guided initiation of bilateral ultrasound assisted catheter directed pulmonary arterial lysis for sub massive pulmonary embolism and right-sided heart strain. 2. Elevated pressure measurements within the main pulmonary artery compatible with critical pulmonary arterial hypertension. 3. Near occlusive DVT noted within the right common femoral vein. PLAN: Above findings discussed with Dr. Hart Robinsons (critical care) at the time of procedure completion and the decision made to proceed with IVC filter placement for temporary caval interruption purposes at the time of post lysis pressure measurement acquisition. Electronically Signed   By: Sandi Mariscal M.D.   On: 08/31/2019 08:24   IR THROMB F/U EVAL ART/VEN FINAL DAY (MS)  Result Date: 08/31/2019 CLINICAL DATA:  Bilateral submassive pulmonary embolism with cor pulmonale. Status post thrombo lytic infusion via each pulmonary artery for 12 hours. Request has also been made to place an IVC filter on completion thrombo lytic therapy given residual DVT in both lower  extremities. EXAM: 1. FINAL DAY ARTERIAL THROMBOLYTIC THERAPY PRESSURE MEASUREMENT. 2. IVC VENOGRAM. 3. PERCUTANEOUS IVC FILTER PLACEMENT. ANESTHESIA/SEDATION: 50 mcg IV Fentanyl. The patient was just given fentanyl and not formally sedated. CONTRAST:  56m OMNIPAQUE IOHEXOL 300 MG/ML SOLN, 152mOMNIPAQUE IOHEXOL 300 MG/ML SOLN, 1041mMNIPAQUE IOHEXOL 300 MG/ML SOLN FLUOROSCOPY TIME:  1 minutes and 30 seconds.  146.6 mGy. PROCEDURE: The procedure, risks, benefits, and alternatives were explained to the patient. Questions regarding the procedure were encouraged and answered. The patient understands and consents to the procedure. A time-out was performed prior to initiating the procedure. Pulmonary artery pressures were directly measured through the  pulmonary arterial infusion catheters. The catheters were then removed. Left femoral vein sheaths placed yesterday were prepped and draped. Local anesthesia was provided with 1% Lidocaine. A guidewire was advanced into the inferior vena cava via one of the sheaths. This sheath was removed and the venotomy dilated. A 10 French deployment sheath was advanced over the guidewire. This was utilized to perform IVC venography. The deployment sheath was further positioned in an appropriate location for filter deployment. A Bard Denali IVC filter was then advanced in the sheath. This was then fully deployed in the infrarenal IVC. Final filter position was confirmed with a fluoroscopic spot image. After the procedure the sheath was removed and hemostasis obtained with manual compression. COMPLICATIONS: None. FINDINGS: Measure pulmonary artery pressure is 55/20, mean 28 mm Hg compared to 61/16, 40 mm Hg prior to lytic therapy. IVC venography demonstrates a normal caliber IVC with no evidence of thrombus. Renal veins are identified bilaterally. The IVC filter was successfully positioned below the level of the renal veins and is appropriately oriented. This IVC filter has both  permanent and retrievable indications. IMPRESSION: 1. Decrease in measured pulmonary artery pressure after 12 hours of bilateral pulmonary arterial thrombolytic therapy. 2. Placement of percutaneous IVC filter in infrarenal IVC. IVC venogram shows no evidence of IVC thrombus and normal caliber of the inferior vena cava. This filter does have both permanent and retrievable indications. PLAN: This IVC filter is potentially retrievable. The patient will be assessed for filter retrieval by Interventional Radiology in approximately 8-12 weeks. Further recommendations regarding filter retrieval, continued surveillance or declaration of device permanence, will be made at that time. Electronically Signed   By: Aletta Edouard M.D.   On: 08/31/2019 14:25      BRONCHOSCOPY (3/2) Impression: - Hemoptysis - The airway examination did not show endbronchial abnormalities or  active bleeding. Blood tinged mucus and old blood noted in the bilateral  airways. - Bronchoalveolar lavage was performed.   UPPER GI  ENDOSCOPY (3/5) Impression: - Normal esophagus. - Normal stomach. - Normal examined duodenum. - No specimens collected.  Recommendation: - Return patient to hospital ward for ongoing care. - Resume regular diet. - Continue present medications. - Management per Pulmonary. - No further GI evaluation at this time. Signing  off.   Subjective: No hemoptysis overnight. Feels weak but does not want to go to SNF.  Discharge Exam: Vitals:   09/17/19 2048 09/18/19 0614  BP: 118/81 115/90  Pulse: 84 79  Resp: 17 18  Temp: (!) 97.3 F (36.3 C) 98.1 F (36.7 C)  SpO2: 99% 99%   Vitals:   09/17/19 1412 09/17/19 1501 09/17/19 2048 09/18/19 0614    BP:  102/69 118/81 115/90  Pulse:  84 84 79  Resp:  _0 Temp:  (!) 97.5 F (36.4 C) (!) 97.3 F (36.3 C) 98.1 F (36.7 C)  TempSrc:  Oral Oral Oral  SpO2: 100% 100% 99% 99%  Weight:      Height:        General: Pt is alert, awake, not in acute distress Cardiovascular: RRR, S1/S2 +, no rubs, no gallops Respiratory: CTA bilaterally, no wheezing, no rhonchi Abdominal: Soft, NT, ND, bowel sounds + Extremities: no edema, no cyanosis    The results of significant diagnostics from this hospitalization (including imaging, microbiology, ancillary and laboratory) are listed below for reference.     Microbiology: Recent Results (from the past 240 hour(s))  SARS CORONAVIRUS 2 (TAT 6-24 HRS) Nasopharyngeal Nasopharyngeal Swab  Status: None   Collection Time: 09/10/19  5:00 PM   Specimen: Nasopharyngeal Swab  Result Value Ref Range Status   SARS Coronavirus 2 NEGATIVE NEGATIVE Final    Comment: (NOTE) SARS-CoV-2 target nucleic acids are NOT DETECTED. The SARS-CoV-2 RNA is generally detectable in upper and lower respiratory specimens during the acute phase of infection. Negative results do not preclude SARS-CoV-2 infection, do not rule out co-infections with other pathogens, and should not be used as the sole basis for treatment or other patient management decisions. Negative results must be combined with clinical observations, patient history, and epidemiological information. The expected result is Negative. Fact Sheet for Patients: SugarRoll.be Fact Sheet for Healthcare Providers: https://www.woods-mathews.com/ This test is not yet approved or cleared by the [place] FDA and  has been authorized for detection and/or diagnosis of SARS-CoV-2 by FDA under an Emergency Use Authorization (EUA). This EUA will remain  in effect (meaning this test can be used) for the duration of the COVID-19 declaration under Section 56 4(b)(1) of  the Act, 21 U.S.C. section 360bbb-3(b)(1), unless the authorization is terminated or revoked sooner. Performed at [facility], [place] [address], [place], [place] [address]   MRSA PCR Screening     Status: None   Collection Time: 09/10/19  8:05 PM   Specimen: Nasal Mucosa; Nasopharyngeal  Result Value Ref Range Status   MRSA by PCR NEGATIVE NEGATIVE Final    Comment:        The GeneXpert MRSA Assay (FDA approved for NASAL specimens only), is one component of a comprehensive MRSA colonization surveillance program. It is not intended to diagnose MRSA infection nor to guide or monitor treatment for MRSA infections. Performed at [facility], [place] [address], [place], [place] [address]   Culture, blood (Routine X 2) w Reflex to ID Panel     Status: Abnormal   Collection Time: 09/10/19  8:26 PM   Specimen: BLOOD  Result Value Ref Range Status   Specimen Description   Final    BLOOD RIGHT ANTECUBITAL Performed at [place] [address] [place], [place] [address]    Special Requests   Final    BOTTLES DRAWN AEROBIC ONLY Blood Culture adequate volume Performed at [place] [address], [place], [place] [address]    Culture  Setup Time   Final    AEROBIC BOTTLE ONLY GRAM NEGATIVE RODS CRITICAL RESULT CALLED TO, READ BACK BY AND VERIFIED WITH: L POINDEXTER PHARMD 09/11/19 2021 JDW Performed at [facility], [place] [address], [place], [place] [address]    Culture ESCHERICHIA COLI (A)  Final   Report Status 09/13/2019 FINAL  Final   Organism ID, Bacteria ESCHERICHIA COLI  Final      Susceptibility   Escherichia coli - MIC*    AMPICILLIN <=2 SENSITIVE Sensitive     CEFAZOLIN <=4 SENSITIVE Sensitive     CEFEPIME <=0.12 SENSITIVE Sensitive     CEFTAZIDIME <=1 SENSITIVE Sensitive     CEFTRIAXONE <=0.25 SENSITIVE Sensitive     CIPROFLOXACIN <=0.25 SENSITIVE Sensitive     GENTAMICIN <=1 SENSITIVE Sensitive       IMIPENEM <=0.25 SENSITIVE Sensitive     TRIMETH/SULFA <=20 SENSITIVE Sensitive     AMPICILLIN/SULBACTAM <=2 SENSITIVE Sensitive     PIP/TAZO <=4 SENSITIVE Sensitive     * ESCHERICHIA COLI  Culture, blood (Routine X 2) w Reflex to ID Panel     Status: Abnormal   Collection  Time: 09/10/19  8:26 PM   Specimen: BLOOD  Result Value Ref Range Status   Specimen Description   Final    BLOOD RIGHT PORTA CATH Performed at [facility], [place] [address], [place], [place] [address]    Special Requests   Final    BOTTLES DRAWN AEROBIC ONLY Blood Culture results may not be optimal due to an inadequate volume of blood received in culture bottles Performed at [place] [address] [place], [place] [address]    Culture  Setup Time   Final    AEROBIC BOTTLE ONLY GRAM NEGATIVE RODS CRITICAL VALUE NOTED.  VALUE IS CONSISTENT WITH PREVIOUSLY REPORTED AND CALLED VALUE.    Culture (A)  Final    ESCHERICHIA COLI SUSCEPTIBILITIES PERFORMED ON PREVIOUS CULTURE WITHIN THE LAST 5 DAYS. Performed at [facility], [place] [address] [place], [place] [address]    Report Status 09/13/2019 FINAL  Final  Blood Culture ID Panel (Reflexed)     Status: Abnormal   Collection Time: 09/10/19  8:26 PM  Result Value Ref Range Status   Enterococcus species NOT DETECTED NOT DETECTED Final   Listeria monocytogenes NOT DETECTED NOT DETECTED Final   Staphylococcus species NOT DETECTED NOT DETECTED Final   Staphylococcus aureus (BCID) NOT DETECTED NOT DETECTED Final   Streptococcus species NOT DETECTED NOT DETECTED Final   Streptococcus agalactiae NOT DETECTED NOT DETECTED Final   Streptococcus pneumoniae NOT DETECTED NOT DETECTED Final   Streptococcus pyogenes NOT DETECTED NOT DETECTED Final   Acinetobacter baumannii NOT DETECTED NOT DETECTED Final   Enterobacteriaceae species DETECTED (A) NOT DETECTED Final    Comment: Enterobacteriaceae represent a large family of  gram-negative bacteria, not a single organism. CRITICAL RESULT CALLED TO, READ BACK BY AND VERIFIED WITH: L POINDEXTER PHARMD 09/11/19 2021 JDW    Enterobacter cloacae complex NOT DETECTED NOT DETECTED Final   Escherichia coli DETECTED (A) NOT DETECTED Final    Comment: CRITICAL RESULT CALLED TO, READ BACK BY AND VERIFIED WITH: L POINDEXTER PHARMD 09/11/19 2021 JDW    Klebsiella oxytoca NOT DETECTED NOT DETECTED Final   Klebsiella pneumoniae NOT DETECTED NOT DETECTED Final   Proteus species NOT DETECTED NOT DETECTED Final   Serratia marcescens NOT DETECTED NOT DETECTED Final   Carbapenem resistance NOT DETECTED NOT DETECTED Final   Haemophilus influenzae NOT DETECTED NOT DETECTED Final   Neisseria meningitidis NOT DETECTED NOT DETECTED Final   Pseudomonas aeruginosa NOT DETECTED NOT DETECTED Final   Candida albicans NOT DETECTED NOT DETECTED Final   Candida glabrata NOT DETECTED NOT DETECTED Final   Candida krusei NOT DETECTED NOT DETECTED Final   Candida parapsilosis NOT DETECTED NOT DETECTED Final   Candida tropicalis NOT DETECTED NOT DETECTED Final    Comment: Performed at [facility], [place] [address] [place], [place] [address]  Urine culture     Status: Abnormal   Collection Time: 09/10/19 10:32 PM   Specimen: Urine, Random  Result Value Ref Range Status   Specimen Description   Final    URINE, RANDOM Performed at [place] [address] [place], [place] [address]    Special Requests   Final    NONE Performed at [facility], [place] [address], [place], [place] [address]    Culture 80,000 COLONIES/mL ESCHERICHIA COLI (A)  Final   Report Status 09/13/2019 FINAL  Final   Organism ID, Bacteria ESCHERICHIA COLI (A)  Final  Susceptibility   Escherichia coli - MIC*    AMPICILLIN 8 SENSITIVE Sensitive     CEFAZOLIN <=4 SENSITIVE Sensitive     CEFTRIAXONE <=0.25 SENSITIVE Sensitive     CIPROFLOXACIN <=0.25 SENSITIVE Sensitive      GENTAMICIN <=1 SENSITIVE Sensitive     IMIPENEM <=0.25 SENSITIVE Sensitive     NITROFURANTOIN <=16 SENSITIVE Sensitive     TRIMETH/SULFA <=20 SENSITIVE Sensitive     AMPICILLIN/SULBACTAM <=2 SENSITIVE Sensitive     PIP/TAZO <=4 SENSITIVE Sensitive     * 80,000 COLONIES/mL ESCHERICHIA COLI  Culture, blood (Routine X 2) w Reflex to ID Panel     Status: None   Collection Time: 09/12/19  6:43 AM   Specimen: BLOOD RIGHT HAND  Result Value Ref Range Status   Specimen Description   Final    BLOOD RIGHT HAND Performed at [place] [address], [place], [place] [address]    Special Requests   Final    BOTTLES DRAWN AEROBIC AND ANAEROBIC Blood Culture adequate volume Performed at [place] [address] [place], [place] [address]    Culture   Final    NO GROWTH 5 DAYS Performed at [facility], [place] [address], [place], [place] [address]    Report Status 09/17/2019 FINAL  Final  Culture, blood (Routine X 2) w Reflex to ID Panel     Status: None   Collection Time: 09/12/19  6:43 AM   Specimen: BLOOD  Result Value Ref Range Status   Specimen Description   Final    BLOOD RIGHT ARM Performed at [place] [address] [place], [place] [address]    Special Requests   Final    BOTTLES DRAWN AEROBIC ONLY Blood Culture adequate volume Performed at [place] [address] [place], [place] [address]    Culture   Final    NO GROWTH 5 DAYS Performed at [facility], [place] [address], [place], [place] [address]    Report Status 09/17/2019 FINAL  Final  Culture, respiratory     Status: None   Collection Time: 09/12/19 10:25 AM   Specimen: Bronchoalveolar Lavage; Respiratory  Result Value Ref Range Status   Specimen Description   Final    BRONCHIAL ALVEOLAR LAVAGE RLL Performed at [place] [address], [place], [place] [address]    Special Requests   Final      NONE Performed at [facility], [place] [address] [place], [place] [address]    Gram Stain   Final    RARE WBC PRESENT, PREDOMINANTLY MONONUCLEAR NO SQUAMOUS EPITHELIAL CELLS PRESENT NO ORGANISMS SEEN    Culture   Final    Consistent with normal respiratory flora. Performed at [facility], [place] [address] [place], [place] [address]    Report Status 09/14/2019 FINAL  Final  Acid Fast Smear (AFB)     Status: None   Collection Time: 09/12/19 10:25 AM   Specimen: Bronchial Alveolar Lavage  Result Value Ref Range Status   AFB Specimen Processing Concentration  Final   Acid Fast Smear Negative  Final    Comment: (NOTE) Performed At: [facility] [place], [place] [id number] Rush Farmer MD [id number]    Source (AFB) BRONCHIAL ALVEOLAR LAVAGE  Final    Comment: RLL Performed at [facility], [place] [address] [place], [place] [address]   Fungus Culture With Stain  Status: None (Preliminary result)   Collection Time: 09/12/19 10:25 AM   Specimen: Bronchial Alveolar Lavage  Result Value Ref Range Status   Fungus Stain Final report  Final    Comment: (NOTE) Performed At: [facility] [place], [place] [id number] Rush Farmer MD [id number]    Fungus (Mycology) Culture PENDING  Incomplete   Fungal Source BRONCHIAL ALVEOLAR LAVAGE  Final    Comment: RLL Performed at [facility], [place] [address], [place], [place] [address]   Respiratory Panel by PCR     Status: None   Collection Time: 09/12/19 10:25 AM   Specimen: Bronchial Alveolar Lavage; Respiratory  Result Value Ref Range Status   Adenovirus NOT DETECTED NOT DETECTED Final   Coronavirus 229E NOT DETECTED NOT DETECTED Final    Comment: (NOTE) The Coronavirus on the Respiratory Panel, DOES NOT test for the novel  Coronavirus (2019 nCoV)    Coronavirus HKU1 NOT DETECTED NOT DETECTED Final   Coronavirus NL63  NOT DETECTED NOT DETECTED Final   Coronavirus OC43 NOT DETECTED NOT DETECTED Final   Metapneumovirus NOT DETECTED NOT DETECTED Final   Rhinovirus / Enterovirus NOT DETECTED NOT DETECTED Final   Influenza A NOT DETECTED NOT DETECTED Final   Influenza B NOT DETECTED NOT DETECTED Final   Parainfluenza Virus 1 NOT DETECTED NOT DETECTED Final   Parainfluenza Virus 2 NOT DETECTED NOT DETECTED Final   Parainfluenza Virus 3 NOT DETECTED NOT DETECTED Final   Parainfluenza Virus 4 NOT DETECTED NOT DETECTED Final   Respiratory Syncytial Virus NOT DETECTED NOT DETECTED Final   Bordetella pertussis NOT DETECTED NOT DETECTED Final   Chlamydophila pneumoniae NOT DETECTED NOT DETECTED Final   Mycoplasma pneumoniae NOT DETECTED NOT DETECTED Final    Comment: Performed at [facility], [place] [address], [place], [place] [address]  Fungus Culture Result     Status: None   Collection Time: 09/12/19 10:25 AM  Result Value Ref Range Status   Result 1 Comment  Final    Comment: (NOTE) KOH/Calcofluor preparation:  no fungus observed. Performed At: [facility] [place], [place] [id number] Rush Farmer MD [id number]   Pneumocystis smear by DFA     Status: None   Collection Time: 09/12/19 10:26 AM   Specimen: Bronchoalveolar Lavage; Respiratory  Result Value Ref Range Status   Specimen Source-PJSRC BRONCHIAL ALVEOLAR LAVAGE  Final    Comment: RLL   Pneumocystis jiroveci Ag NEGATIVE  Final    Comment: Performed at [place] [facility] Performed at [place] [address] [place], [place] [address]      Labs: BNP (last 3 results) Recent Labs    08/30/19 0656  BNP 5,277.8*   Basic Metabolic Panel: Recent Labs  Lab 09/12/19 0148 09/13/19 0255 09/14/19 0514  NA 138 135 134*  K 3.8 3.4* 3.9  CL 107 107 106  CO2 21* 19* 19*  GLUCOSE 113* 124* 101*  BUN _0 CREATININE 0.83 0.76 0.72  CALCIUM 9.3 8.8* 9.4   Liver Function  Tests: Recent Labs  Lab 09/12/19 0643  AST 41  ALT 69*  ALKPHOS 152*  BILITOT 1.8*  PROT 5.8*  ALBUMIN 2.0*   No results for input(s): LIPASE, AMYLASE in the last 168 hours. No results for input(s): AMMONIA in the last 168 hours. CBC: Recent Labs  Lab 09/13/19 0255 09/13/19 0255 09/13/19 1453 09/14/19 0514 09/15/19 0838 09/16/19 0532 09/17/19 0859  WBC 12.2*  --   --  7.3 7.3 7.4 7.3  HGB 9.8*   < > 10.7* 10.6* 10.8* 10.1* 11.2*  HCT 31.2*   < > 33.5* 32.4* 32.8* 31.6* 34.9*  MCV 95.4  --   --  94.7 94.5 94.6 94.3  PLT 308  --   --  358 420* 453* 514*   < > = values in this interval not displayed.   Cardiac Enzymes: No results for input(s): CKTOTAL, CKMB, CKMBINDEX, TROPONINI in the last 168 hours. BNP: Invalid input(s): POCBNP CBG: No results for input(s): GLUCAP in the last 168 hours. D-Dimer No results for input(s): DDIMER in the last 72 hours. Hgb A1c No results for input(s): HGBA1C in the last 72 hours. Lipid Profile No results for input(s): CHOL, HDL, LDLCALC, TRIG, CHOLHDL, LDLDIRECT in the last 72 hours. Thyroid function studies No results for input(s): TSH, T4TOTAL, T3FREE, THYROIDAB in the last 72 hours.  Invalid input(s): FREET3 Anemia work up No results for input(s): VITAMINB12, FOLATE, FERRITIN, TIBC, IRON, RETICCTPCT in the last 72 hours. Urinalysis No results found for: COLORURINE, APPEARANCEUR, [place], [place], [place], [place], [place], [place], PROTEINUR, UROBILINOGEN, NITRITE, LEUKOCYTESUR Sepsis Labs Invalid input(s): PROCALCITONIN,  WBC,  LACTICIDVEN Microbiology Recent Results (from the past 240 hour(s))  SARS CORONAVIRUS 2 (TAT 6-24 HRS) Nasopharyngeal Nasopharyngeal Swab     Status: None   Collection Time: 09/10/19  5:00 PM   Specimen: Nasopharyngeal Swab  Result Value Ref Range Status   SARS Coronavirus 2 NEGATIVE NEGATIVE Final    Comment: (NOTE) SARS-CoV-2 target nucleic acids are NOT DETECTED. The SARS-CoV-2 RNA is  generally detectable in upper and lower respiratory specimens during the acute phase of infection. Negative results do not preclude SARS-CoV-2 infection, do not rule out co-infections with other pathogens, and should not be used as the sole basis for treatment or other patient management decisions. Negative results must be combined with clinical observations, patient history, and epidemiological information. The expected result is Negative. Fact Sheet for Patients: SugarRoll.be Fact Sheet for Healthcare Providers: https://www.woods-mathews.com/ This test is not yet approved or cleared by the [place] FDA and  has been authorized for detection and/or diagnosis of SARS-CoV-2 by FDA under an Emergency Use Authorization (EUA). This EUA will remain  in effect (meaning this test can be used) for the duration of the COVID-19 declaration under Section 56 4(b)(1) of the Act, 21 U.S.C. section 360bbb-3(b)(1), unless the authorization is terminated or revoked sooner. Performed at [facility], [place] [address], [place], [place] [address]   MRSA PCR Screening     Status: None   Collection Time: 09/10/19  8:05 PM   Specimen: Nasal Mucosa; Nasopharyngeal  Result Value Ref Range Status   MRSA by PCR NEGATIVE NEGATIVE Final    Comment:        The GeneXpert MRSA Assay (FDA approved for NASAL specimens only), is one component of a comprehensive MRSA colonization surveillance program. It is not intended to diagnose MRSA infection nor to guide or monitor treatment for MRSA infections. Performed at [facility], [place] [address], [place], [place] [address]   Culture, blood (Routine X 2) w Reflex to ID Panel     Status: Abnormal   Collection Time: 09/10/19  8:26 PM   Specimen: BLOOD  Result Value Ref Range Status   Specimen Description   Final    BLOOD RIGHT ANTECUBITAL Performed at [place]  [address] [place], [place] [address]    Special Requests   Final  BOTTLES DRAWN AEROBIC ONLY Blood Culture adequate volume Performed at [place] [address] [place], [place] [address]    Culture  Setup Time   Final    AEROBIC BOTTLE ONLY GRAM NEGATIVE RODS CRITICAL RESULT CALLED TO, READ BACK BY AND VERIFIED WITH: L POINDEXTER PHARMD 09/11/19 2021 JDW Performed at [facility], [place] [address], [place], [place] [address]    Culture ESCHERICHIA COLI (A)  Final   Report Status 09/13/2019 FINAL  Final   Organism ID, Bacteria ESCHERICHIA COLI  Final      Susceptibility   Escherichia coli - MIC*    AMPICILLIN <=2 SENSITIVE Sensitive     CEFAZOLIN <=4 SENSITIVE Sensitive     CEFEPIME <=0.12 SENSITIVE Sensitive     CEFTAZIDIME <=1 SENSITIVE Sensitive     CEFTRIAXONE <=0.25 SENSITIVE Sensitive     CIPROFLOXACIN <=0.25 SENSITIVE Sensitive     GENTAMICIN <=1 SENSITIVE Sensitive     IMIPENEM <=0.25 SENSITIVE Sensitive     TRIMETH/SULFA <=20 SENSITIVE Sensitive     AMPICILLIN/SULBACTAM <=2 SENSITIVE Sensitive     PIP/TAZO <=4 SENSITIVE Sensitive     * ESCHERICHIA COLI  Culture, blood (Routine X 2) w Reflex to ID Panel     Status: Abnormal   Collection Time: 09/10/19  8:26 PM   Specimen: BLOOD  Result Value Ref Range Status   Specimen Description   Final    BLOOD RIGHT PORTA CATH Performed at [place] [address], [place], [place] [address]    Special Requests   Final    BOTTLES DRAWN AEROBIC ONLY Blood Culture results may not be optimal due to an inadequate volume of blood received in culture bottles Performed at [place] [address], [place], [place] [address]    Culture  Setup Time   Final    AEROBIC BOTTLE ONLY GRAM NEGATIVE RODS CRITICAL VALUE NOTED.  VALUE IS CONSISTENT WITH PREVIOUSLY REPORTED AND CALLED VALUE.    Culture (A)  Final    ESCHERICHIA COLI SUSCEPTIBILITIES PERFORMED ON PREVIOUS  CULTURE WITHIN THE LAST 5 DAYS. Performed at [facility], [place] [address] [place], [place] [address]    Report Status 09/13/2019 FINAL  Final  Blood Culture ID Panel (Reflexed)     Status: Abnormal   Collection Time: 09/10/19  8:26 PM  Result Value Ref Range Status   Enterococcus species NOT DETECTED NOT DETECTED Final   Listeria monocytogenes NOT DETECTED NOT DETECTED Final   Staphylococcus species NOT DETECTED NOT DETECTED Final   Staphylococcus aureus (BCID) NOT DETECTED NOT DETECTED Final   Streptococcus species NOT DETECTED NOT DETECTED Final   Streptococcus agalactiae NOT DETECTED NOT DETECTED Final   Streptococcus pneumoniae NOT DETECTED NOT DETECTED Final   Streptococcus pyogenes NOT DETECTED NOT DETECTED Final   Acinetobacter baumannii NOT DETECTED NOT DETECTED Final   Enterobacteriaceae species DETECTED (A) NOT DETECTED Final    Comment: Enterobacteriaceae represent a large family of gram-negative bacteria, not a single organism. CRITICAL RESULT CALLED TO, READ BACK BY AND VERIFIED WITH: L POINDEXTER PHARMD 09/11/19 2021 JDW    Enterobacter cloacae complex NOT DETECTED NOT DETECTED Final   Escherichia coli DETECTED (A) NOT DETECTED Final    Comment: CRITICAL RESULT CALLED TO, READ BACK BY AND VERIFIED WITH: L POINDEXTER PHARMD 09/11/19 2021 JDW    Klebsiella oxytoca NOT DETECTED NOT DETECTED Final   Klebsiella pneumoniae NOT DETECTED NOT DETECTED Final   Proteus species NOT DETECTED  NOT DETECTED Final   Serratia marcescens NOT DETECTED NOT DETECTED Final   Carbapenem resistance NOT DETECTED NOT DETECTED Final   Haemophilus influenzae NOT DETECTED NOT DETECTED Final   Neisseria meningitidis NOT DETECTED NOT DETECTED Final   Pseudomonas aeruginosa NOT DETECTED NOT DETECTED Final   Candida albicans NOT DETECTED NOT DETECTED Final   Candida glabrata NOT DETECTED NOT DETECTED Final   Candida krusei NOT DETECTED NOT DETECTED Final   Candida parapsilosis NOT DETECTED NOT  DETECTED Final   Candida tropicalis NOT DETECTED NOT DETECTED Final    Comment: Performed at [facility], [place] [address] [place], [place] [address]  Urine culture     Status: Abnormal   Collection Time: 09/10/19 10:32 PM   Specimen: Urine, Random  Result Value Ref Range Status   Specimen Description   Final    URINE, RANDOM Performed at [place] [address], [place], [place] [address]    Special Requests   Final    NONE Performed at [facility], [place] [address] [place], [place] [address]    Culture 80,000 COLONIES/mL ESCHERICHIA COLI (A)  Final   Report Status 09/13/2019 FINAL  Final   Organism ID, Bacteria ESCHERICHIA COLI (A)  Final      Susceptibility   Escherichia coli - MIC*    AMPICILLIN 8 SENSITIVE Sensitive     CEFAZOLIN <=4 SENSITIVE Sensitive     CEFTRIAXONE <=0.25 SENSITIVE Sensitive     CIPROFLOXACIN <=0.25 SENSITIVE Sensitive     GENTAMICIN <=1 SENSITIVE Sensitive     IMIPENEM <=0.25 SENSITIVE Sensitive     NITROFURANTOIN <=16 SENSITIVE Sensitive     TRIMETH/SULFA <=20 SENSITIVE Sensitive     AMPICILLIN/SULBACTAM <=2 SENSITIVE Sensitive     PIP/TAZO <=4 SENSITIVE Sensitive     * 80,000 COLONIES/mL ESCHERICHIA COLI  Culture, blood (Routine X 2) w Reflex to ID Panel     Status: None   Collection Time: 09/12/19  6:43 AM   Specimen: BLOOD RIGHT HAND  Result Value Ref Range Status   Specimen Description   Final    BLOOD RIGHT HAND Performed at [place] [address], [place], [place] [address]    Special Requests   Final    BOTTLES DRAWN AEROBIC AND ANAEROBIC Blood Culture adequate volume Performed at [place] [address], [place], [place] [address]    Culture   Final    NO GROWTH 5 DAYS Performed at [facility], [place] [address], [place], [place] [address]    Report Status 09/17/2019 FINAL  Final  Culture, blood (Routine X 2) w Reflex to ID Panel      Status: None   Collection Time: 09/12/19  6:43 AM   Specimen: BLOOD  Result Value Ref Range Status   Specimen Description   Final    BLOOD RIGHT ARM Performed at [place] [address], [place], [place] [address]    Special Requests   Final    BOTTLES DRAWN AEROBIC ONLY Blood Culture adequate volume Performed at [place] [address] [place], [place] [address]    Culture   Final    NO GROWTH 5 DAYS Performed at [facility], [place] [address] [place], [place] [address]    Report Status 09/17/2019 FINAL  Final  Culture, respiratory     Status: None   Collection Time: 09/12/19 10:25 AM   Specimen: Bronchoalveolar Lavage;  Respiratory  Result Value Ref Range Status   Specimen Description   Final    BRONCHIAL ALVEOLAR LAVAGE RLL Performed at [place] [address], [place], [place] [address]    Special Requests   Final    NONE Performed at [facility], [place] [address], [place], [place] [address]    Gram Stain   Final    RARE WBC PRESENT, PREDOMINANTLY MONONUCLEAR NO SQUAMOUS EPITHELIAL CELLS PRESENT NO ORGANISMS SEEN    Culture   Final    Consistent with normal respiratory flora. Performed at [facility], [place] [address], [place], [place] [address]    Report Status 09/14/2019 FINAL  Final  Acid Fast Smear (AFB)     Status: None   Collection Time: 09/12/19 10:25 AM   Specimen: Bronchial Alveolar Lavage  Result Value Ref Range Status   AFB Specimen Processing Concentration  Final   Acid Fast Smear Negative  Final    Comment: (NOTE) Performed At: [facility] [place], [place] [id number] Rush Farmer MD NU:[id number]    Source (AFB) BRONCHIAL ALVEOLAR LAVAGE  Final    Comment: RLL Performed at [facility], [place] [address], [place], [place] [address]   Fungus Culture With Stain     Status: None (Preliminary result)    Collection Time: 09/12/19 10:25 AM   Specimen: Bronchial Alveolar Lavage  Result Value Ref Range Status   Fungus Stain Final report  Final    Comment: (NOTE) Performed At: [facility] [place], [place] [id number] Rush Farmer MD [id number]    Fungus (Mycology) Culture PENDING  Incomplete   Fungal Source BRONCHIAL ALVEOLAR LAVAGE  Final    Comment: RLL Performed at [facility], [place] [address], [place], [place] [address]   Respiratory Panel by PCR     Status: None   Collection Time: 09/12/19 10:25 AM   Specimen: Bronchial Alveolar Lavage; Respiratory  Result Value Ref Range Status   Adenovirus NOT DETECTED NOT DETECTED Final   Coronavirus 229E NOT DETECTED NOT DETECTED Final    Comment: (NOTE) The Coronavirus on the Respiratory Panel, DOES NOT test for the novel  Coronavirus (2019 nCoV)    Coronavirus HKU1 NOT DETECTED NOT DETECTED Final   Coronavirus NL63 NOT DETECTED NOT DETECTED Final   Coronavirus OC43 NOT DETECTED NOT DETECTED Final   Metapneumovirus NOT DETECTED NOT DETECTED Final   Rhinovirus / Enterovirus NOT DETECTED NOT DETECTED Final   Influenza A NOT DETECTED NOT DETECTED Final   Influenza B NOT DETECTED NOT DETECTED Final   Parainfluenza Virus 1 NOT DETECTED NOT DETECTED Final   Parainfluenza Virus 2 NOT DETECTED NOT DETECTED Final   Parainfluenza Virus 3 NOT DETECTED NOT DETECTED Final   Parainfluenza Virus 4 NOT DETECTED NOT DETECTED Final   Respiratory Syncytial Virus NOT DETECTED NOT DETECTED Final   Bordetella pertussis NOT DETECTED NOT DETECTED Final   Chlamydophila pneumoniae NOT DETECTED NOT DETECTED Final   Mycoplasma pneumoniae NOT DETECTED NOT DETECTED Final    Comment: Performed at [facility], [place] [address] [place] [address], [place], [place] [address]  Fungus Culture Result     Status: None   Collection Time: 09/12/19 10:25 AM  Result Value Ref Range Status   Result 1 Comment  Final    Comment:  (NOTE) KOH/Calcofluor preparation:  no fungus observed. Performed At: [facility] [address] [place], [place] [id number] Rush Farmer MD KG:[id number]   Pneumocystis  smear by DFA     Status: None   Collection Time: 09/12/19 10:26 AM   Specimen: Bronchoalveolar Lavage; Respiratory  Result Value Ref Range Status   Specimen Source-[facility] BRONCHIAL ALVEOLAR LAVAGE  Final    Comment: RLL   Pneumocystis jiroveci Ag NEGATIVE  Final    Comment: Performed at [place] [facility] Performed at [place] [address] [place],  [address]      Time coordinating discharge: 35 minutes  SIGNED:   Cordelia Poche, MD [facility] 09/18/2019, 11:18 AM

## 2019-09-18 NOTE — Progress Notes (Signed)
Physical Therapy Treatment Patient Details Name: Joe [place] MRN: [id number] DOB: December 24, 1950 Today's Date: 09/18/2019    History of Present Illness Joe [place] is a 69 y.o. male with medical history significant of frequent PVCs and aortic root dilation and admitted for syncopal event . CTA of the chest was significant for a massive PE with complete occlusion of the right main artery with absent arterial blood flow to right lung in addition to partially occlusive filling defects of left lower lobe pulmonary arteries; also evidence of significant right heart strain with associated right heart dilation.  Pt admitted for treatment of acute massive PE and left lower extremity deep vein thrombosis with significant right heart dysfunction    PT Comments    Pt feeling "better".  OOB in recliner.  On the phone "trying to take care of some business".  Assisted with amb using his new walker.  General transfer comment: good use of hands to steady self and good safety cognition.  General Gait Details: used walker correctly.  Mild fatigue but "improved" stated pt. Pt eager to D/C to home today.    Follow Up Recommendations  (pt declines SNF so D/C plan is now home with Fiance)     Equipment Recommendations  Rolling walker with 5" wheels    Recommendations for Other Services       Precautions / Restrictions Precautions Precautions: Fall Precaution Comments: He presents with reduced safety awareness and insight into current medical situation Restrictions Weight Bearing Restrictions: No    Mobility  Bed Mobility Overal bed mobility: Needs Assistance Bed Mobility: Supine to Sit     Supine to sit: Min guard     General bed mobility comments: OOB in recliner  Transfers Overall transfer level: Needs assistance Equipment used: Rolling walker (2 wheeled) Transfers: Sit to/from Stand Sit to Stand: Supervision Stand pivot transfers: Supervision       General transfer comment: good use of  hands to steady self and good safety cognition  Ambulation/Gait Ambulation/Gait assistance: Supervision Gait Distance (Feet): 225 Feet Assistive device: Rolling walker (2 wheeled) Gait Pattern/deviations: Step-through pattern Gait velocity: decreased   General Gait Details: used walker correctly.  Mild fatigue but "improved" stated pt.   Stairs             Wheelchair Mobility    Modified Rankin (Stroke Patients Only)       Balance Overall balance assessment: Mild deficits observed, not formally tested Sitting-balance support: Bilateral upper extremity supported Sitting balance-Leahy Scale: Fair     Standing balance support: No upper extremity supported Standing balance-Leahy Scale: Fair Standing balance comment: Fair standing balance, and presented as anxious when sit<>stand and pivoted into BS chair                            Cognition Arousal/Alertness: Awake/alert Behavior During Therapy: WFL for tasks assessed/performed Overall Cognitive Status: Within Functional Limits for tasks assessed                                 General Comments: pleasant      Exercises      General Comments        Pertinent Vitals/Pain Pain Assessment: No/denies pain    Home Living                      Prior Function  PT Goals (current goals can now be found in the care plan section) Progress towards PT goals: Progressing toward goals    Frequency    Min 3X/week      PT Plan Current plan remains appropriate    Co-evaluation              AM-PAC PT "6 Clicks" Mobility   Outcome Measure  Help needed turning from your back to your side while in a flat bed without using bedrails?: None Help needed moving from lying on your back to sitting on the side of a flat bed without using bedrails?: None Help needed moving to and from a bed to a chair (including a wheelchair)?: None Help needed standing up from a chair  using your arms (e.g., wheelchair or bedside chair)?: None Help needed to walk in hospital room?: None Help needed climbing 3-5 steps with a railing? : A Little 6 Click Score: 23    End of Session Equipment Utilized During Treatment: Gait belt Activity Tolerance: Patient tolerated treatment well Patient left: in chair;with call bell/phone within reach;with bed alarm set Nurse Communication: Mobility status(RN observed) PT Visit Diagnosis: Difficulty in walking, not elsewhere classified (R26.2);Muscle weakness (generalized) (M62.81)     Time: 5993-5701 PT Time Calculation (min) (ACUTE ONLY): 15 min  Charges:  $Gait Training: 8-22 mins                     Felecia Shelling  PTA Acute  Rehabilitation Services Pager      [phone] Office      [phone]

## 2019-09-19 ENCOUNTER — Telehealth: Payer: Self-pay | Admitting: Pulmonary Disease

## 2019-09-19 NOTE — Telephone Encounter (Signed)
error 

## 2019-09-21 ENCOUNTER — Inpatient Hospital Stay: Payer: BC Managed Care – PPO | Admitting: Pulmonary Disease

## 2019-09-21 NOTE — Progress Notes (Signed)
[facility]  [phone] [phone]     ID: Joe Cantu DOB: [birth year]  MR#: [id number]  FVC#:[id number]  Patient Care Team: Iona Beard, MD as PCP - General (Family Medicine) Chauncey Cruel, MD OTHER MD:  CHIEF COMPLAINT: Recent diagnosis of massive PE and left lower extremity DVT now with hemoptysis while on Xarelto  CURRENT TREATMENT: Rivaroxaban; reversible filter in place   INTERVAL HISTORY: Joe Cantu returns today for follow up of his pulmonary embolism and left leg DVT accompanied by his sister Katharine Look. He was discharged from the hospital on 09/18/2019.  To recap: He had a massive PE, was initially anticoagulated with heparin then switched to rivaroxaban and then developed hemoptysis.  He stabilized and was discharged on 15 mg daily instead of the standard 15 mg twice daily.  He does have a reversible filter in place.  Since I last saw him in the hospital he underwent upper endoscopy under Dr. Benson Norway, 09/19/2019.  This showed a normal esophagus stomach and duodenum.  No specimens were collected.  REVIEW OF SYSTEMS: Tobin tells me he has had no further hemoptysis since September 16, 2019.  He has a minimal cough at present.  He feels a little bit short of breath when he walks across the house but he feels quite secure walking across the house and is not using a cane or walker.  He tells me that the rehab folks "are not going to be coming back" because he is doing so well.  There have been no falls.  A detailed review of systems today was otherwise stable.   HISTORY OF CURRENT ILLNESS: From the original intake note:   MCKINNLEY Cantu is a 69 year-old [place], man with a past medical history significant for PVCs, aortic root dilatation, recent diagnosis of acute massive PE with significant right heart dysfunction and left lower extremity DVT in February 2021.    He was hospitalized 08/30/2019 through 09/02/2019 for the PE and DVT.  With extensive  questioning this appears to have been an unprovoked PE.  The right pulmonary artery was completely opacified, with no flow to the right lung, with significant clot in the left side as well.  He received catheter directed TPA and was placed on a heparin drip with transition to Xarelto.  He had a retrievable IVC filter placed on 08/31/2019 because there was residual clot in the legs  The patient did well at home after discharge although he was still "taking it slow".  On  09/09/2019 he developed a persistent cough and eventually hemoptysis.  He could not quantify how much blood he coughed up however per EMS report there was approximately 300 to 600 cc of blood in a juice jug.    In the ER, his alkaline phosphatase was 211, albumin 2.3, AST 134, ALT 110, WBC 14.2, hemoglobin 10.4, INR 2.7.  His Xarelto was placed on hold and pulmonology has been consulted.  They recommended a CT of the chest and possible bronchoscopy which was done today and shows improvement but persistence of the bilateral PEs, with no obvious cause of the bleeding.  Due to concern for possible aspiration during his coughing episodes, he is currently on IV antibiotics.    Note that during his last hospitalization, a hyper coag panel was sent which was unrevealing with the exception of low protein S activity at 49%, likely reactive, heterozygosity for factor V Leiden.  We are asked to see the patient to make recommendations regarding anticoagulation in  this patient with recent diagnosis of massive PE, left lower extremity DVT, and now with hemoptysis while on Xarelto.  The patient's subsequent history is as detailed below.   PAST MEDICAL HISTORY: Past Medical History:  Diagnosis Date  . Aortic root dilatation ([place]) 02/21/2019  . Dyspnea on exertion   . Frequent unifocal PVCs 02/21/2019  . Glaucoma   . Gonorrhea    70 & 53 yrs of age; negative for syphillis, herpes and HIV  . Left inguinal hernia   . Pneumonia   . Ventricular  premature beats   . Wears dentures     PAST SURGICAL HISTORY: Past Surgical History:  Procedure Laterality Date  . BRONCHIAL WASHINGS  09/12/2019   Procedure: BRONCHIAL WASHINGS;  Surgeon: Marshell Garfinkel, MD;  Location: [facility];  Service: Cardiopulmonary;;  . COLONOSCOPY  07/2018   Benign polyps   . ESOPHAGOGASTRODUODENOSCOPY (EGD) WITH PROPOFOL N/A 09/15/2019   Procedure: ESOPHAGOGASTRODUODENOSCOPY (EGD) WITH PROPOFOL;  Surgeon: Carol Ada, MD;  Location: [facility];  Service: Endoscopy;  Laterality: N/A;  . HIATAL HERNIA REPAIR    . IR ANGIOGRAM PULMONARY BILATERAL SELECTIVE  08/30/2019  . IR INFUSION THROMBOL ARTERIAL INITIAL (MS)  08/30/2019  . IR INFUSION THROMBOL ARTERIAL INITIAL (MS)  08/30/2019  . IR IVC FILTER PLMT / S&I /IMG GUID/MOD SED  08/31/2019  . IR THROMB F/U EVAL ART/VEN FINAL DAY (MS)  08/31/2019  . IR US GUIDE VASC ACCESS LEFT  08/30/2019  . IR US GUIDE VASC ACCESS LEFT  08/30/2019  . VIDEO BRONCHOSCOPY N/A 09/12/2019   Procedure: VIDEO BRONCHOSCOPY WITHOUT FLUORO;  Surgeon: Marshell Garfinkel, MD;  Location: [facility];  Service: Cardiopulmonary;  Laterality: N/A;    FAMILY HISTORY: Family History  Problem Relation Age of Onset  . Diabetes Mother   . Cancer Father        colon  . Diabetes Sister   . Diabetes Brother      SOCIAL HISTORY: (updated 09/2019)  Joe Cantu is single.  He reports that he has 6 children who live throughout [place].  He lives with his fiance Diane Page, who works as a bus attendant, and shepherds children to school buses.  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  Denies history of alcohol use.  He previously smoked 1/2 pack/day of cigarettes for approximately 10 years but quit back in about 1977.    ADVANCED DIRECTIVES: The patient tells me his sister is Tarri Jabril is his healthcare power of attorney.  She can be reached at [phone].   HEALTH MAINTENANCE: Social History   Tobacco Use    . Smoking status: Former Smoker    Packs/day: 0.50    Years: 10.00    Pack years: 5.00    Types: Cigarettes    Quit date: 1970    Years since quitting: 51.2  . Smokeless tobacco: Never Used  Substance Use Topics  . Alcohol use: Yes    Comment: occasional wine  . Drug use: No     Colonoscopy: Reports history of colonoscopy about 5 years ago.  States that he had a polyp which was benign.  He is uncertain of the exact date.  Bone density: never done   No Known Allergies  Current Outpatient Medications  Medication Sig Dispense Refill  . acetaminophen (TYLENOL) 325 MG tablet Take 650 mg by mouth every 6 (six) hours as needed for mild pain or moderate pain.    . dorzolamide (TRUSOPT) 2 % ophthalmic solution Place 1 drop  into both eyes 2 (two) times daily.    . Latanoprostene Bunod (VYZULTA) 0.024 % SOLN Apply 1 drop to eye daily.    . metoprolol tartrate (LOPRESSOR) 25 MG tablet Take 0.5 tablets (12.5 mg total) by mouth 2 (two) times daily. 30 tablet 0  . polyethylene glycol (MIRALAX / GLYCOLAX) 17 g packet Take 17 g by mouth daily. 14 each 0  . Rivaroxaban 15 & 20 MG TBPK Take one 15 mg tablets ONCE PER DAY. Once you have completed your 15 mg tablets, please take a HALF TABLET of your 20 mg tablet (10 mg daily)    . tamsulosin (FLOMAX) 0.4 MG CAPS capsule Take 0.4 mg by mouth at bedtime.    . timolol (TIMOPTIC) 0.5 % ophthalmic solution Place 1 drop into both eyes 2 (two) times daily.     No current facility-administered medications for this visit.    OBJECTIVE: Middle-aged African-American man examined in a wheelchair  Vitals:   09/22/19 0933  BP: 130/84  Pulse: 79  Resp: 16  Temp: 98.9 F (37.2 C)  SpO2: 100%     Body mass index is 19.67 kg/m.   Wt Readings from Last 3 Encounters:  09/22/19 157 lb 6.4 oz (71.4 kg)  09/15/19 170 lb 6.7 oz (77.3 kg)  09/08/19 172 lb 3.2 oz (78.1 kg)      ECOG FS:2 - Symptomatic, <50% confined to bed  Ocular: Sclerae  unicteric Ear-nose-throat: Wearing a mask Lymphatic: No cervical or supraclavicular adenopathy Lungs no rales or rhonchi, no wheezes Heart regular rate and rhythm, no murmur appreciated Abd soft, nontender, positive bowel sounds MSK no focal spinal tenderness Neuro: non-focal, well-oriented, appropriate affect   LAB RESULTS:  CMP     Component Value Date/Time   NA 136 09/22/2019 0915   K 3.8 09/22/2019 0915   CL 106 09/22/2019 0915   CO2 23 09/22/2019 0915   GLUCOSE 93 09/22/2019 0915   BUN 12 09/22/2019 0915   CREATININE 0.76 09/22/2019 0915   CALCIUM 10.3 09/22/2019 0915   PROT 7.2 09/22/2019 0915   ALBUMIN 2.8 (L) 09/22/2019 0915   AST 38 09/22/2019 0915   ALT 55 (H) 09/22/2019 0915   ALKPHOS 156 (H) 09/22/2019 0915   BILITOT 0.6 09/22/2019 0915   GFRNONAA >60 09/22/2019 0915   GFRAA >60 09/22/2019 0915    No results found for: TOTALPROTELP, ALBUMINELP, A1GS, A2GS, BETS, BETA2SER, GAMS, MSPIKE, SPEI  Lab Results  Component Value Date   WBC 6.4 09/22/2019   NEUTROABS 4.5 09/22/2019   HGB 9.9 (L) 09/22/2019   HCT 30.9 (L) 09/22/2019   MCV 92.5 09/22/2019   PLT 458 (H) 09/22/2019    No results found for: LABCA2  No components found for: [id number]  No results for input(s): INR in the last 168 hours.  No results found for: LABCA2  No results found for: HFG902  No results found for: XJD552  No results found for: CEY223  No results found for: CA2729  No components found for: HGQUANT  No results found for: CEA1 / No results found for: CEA1   No results found for: AFPTUMOR  No results found for: CHROMOGRNA  No results found for: KPAFRELGTCHN, LAMBDASER, KAPLAMBRATIO (kappa/lambda light chains)  No results found for: HGBA, HGBA2QUANT, HGBFQUANT, HGBSQUAN (Hemoglobinopathy evaluation)   No results found for: LDH  No results found for: IRON, TIBC, IRONPCTSAT (Iron and TIBC)  No results found for: FERRITIN  Urinalysis No results found for:  COLORURINE, APPEARANCEUR, [place], [place], GLUCOSEU,  HGBUR, BILIRUBINUR, KETONESUR, PROTEINUR, UROBILINOGEN, NITRITE, LEUKOCYTESUR   STUDIES: DG Chest 2 View  Result Date: 09/10/2019 CLINICAL DATA:  Hemoptysis.  Recent PE. EXAM: CHEST - 2 VIEW COMPARISON:  08/30/2019 CT FINDINGS: Lateral view degraded by patient arm position. Midline trachea. Mild cardiomegaly. Pulmonary artery prominence. Right hemidiaphragm elevation. No pleural effusion or pneumothorax. Pulmonary interstitial prominence, favored to be due to AP portable technique. Subtle right lower lobe opacity is suspected, likely corresponding to infarct or atelectasis on prior CT. IMPRESSION: Cardiomegaly, without acute disease. Right base pulmonary opacity, similar to on the prior CT. Pulmonary artery enlargement, suggesting pulmonary arterial hypertension. Electronically Signed   By: Abigail Miyamoto M.D.   On: 09/10/2019 17:19   CT ANGIO CHEST PE W OR WO CONTRAST  Result Date: 09/11/2019 CLINICAL DATA:  History of pulmonary embolus. Hemoptysis. EXAM: CT ANGIOGRAPHY CHEST WITH CONTRAST TECHNIQUE: Multidetector CT imaging of the chest was performed using the standard protocol during bolus administration of intravenous contrast. Multiplanar CT image reconstructions and MIPs were obtained to evaluate the vascular anatomy. CONTRAST:  175m OMNIPAQUE IOHEXOL 350 MG/ML SOLN COMPARISON:  August 30, 2019. FINDINGS: Cardiovascular: There remains large filling defects in the main portions of both pulmonary arteries which may be slightly decreased compared to prior exam. Mild cardiomegaly is noted. No pericardial effusion is noted. Atherosclerosis of thoracic aorta is noted without aneurysm or dissection. Mediastinum/Nodes: No enlarged mediastinal, hilar, or axillary lymph nodes. Thyroid gland, trachea, and esophagus demonstrate no significant findings. Lungs/Pleura: No pneumothorax or pleural effusion is noted. Minimal bilateral posterior basilar subsegmental  atelectasis is noted. Upper Abdomen: No acute abnormality. Musculoskeletal: No chest wall abnormality. No acute or significant osseous findings. Review of the MIP images confirms the above findings. IMPRESSION: There remains large bilateral pulmonary emboli in the main portions of the pulmonary arteries which are slightly decreased in size compared to prior exam. Aortic Atherosclerosis (ICD10-I70.0). Electronically Signed   By: JMarijo ConceptionM.D.   On: 09/11/2019 10:34   CT Angio Chest PE W and/or Wo Contrast  Result Date: 08/30/2019 CLINICAL DATA:  Syncopal episode while using the bathroom. EXAM: CT ANGIOGRAPHY CHEST WITH CONTRAST TECHNIQUE: Multidetector CT imaging of the chest was performed using the standard protocol during bolus administration of intravenous contrast. Multiplanar CT image reconstructions and MIPs were obtained to evaluate the vascular anatomy. CONTRAST:  1074mOMNIPAQUE IOHEXOL 350 MG/ML SOLN COMPARISON:  None. FINDINGS: Cardiovascular: Massive bilateral pulmonary arterial filling defects with near complete occlusion of the right pulmonary artery, no pulmonary arterial blood flow noted to the right lung. Large thrombus in the left lower lobe pulmonary artery which is partially occlusive. Marked right heart dilatation with contrast refluxing into the hepatic veins and IVC. Elevated RV to LV ratio of 2.16. Aortic atherosclerosis. Cannot assess for dissection given phase of contrast. Mild dilatation of the aortic root at 3.6 cm without aortic aneurysm. No pericardial effusion. Mediastinum/Nodes: No adenopathy. No esophageal wall thickening. No suspicious thyroid nodule. Lungs/Pleura: Triangular subpleural opacities in the right upper and lower lobes likely pulmonary infarct. Diminished pulmonary vessels in the right lung related to pulmonary embolus. Subpleural opacities in the left lower lobe favor atelectasis. No pulmonary mass. No pulmonary edema. Upper Abdomen: Contrast refluxes into the  hepatic veins and IVC. No other acute findings. Musculoskeletal: There are no acute or suspicious osseous abnormalities. Few bone islands in the right proximal humerus. Review of the MIP images confirms the above findings. IMPRESSION: 1. Massive pulmonary emboli with complete occlusion of the right main  pulmonary artery and absent pulmonary arterial blood flow to the right lung. Large but partially occlusive filling defects in the left lower lobe pulmonary arteries. Significant right heart strain with RV to LV ratio of greater than 2, right heart dilatation and contrast refluxing into the hepatic veins and IVC. 2. Small pulmonary infarcts in the right upper and lower lobe. Critical Value/emergent results were called by telephone at the time of interpretation on 08/30/2019 at 6:24 am to Dr Addison Lank , who verbally acknowledged these results. Electronically Signed   By: Keith Rake M.D.   On: 08/30/2019 06:30   IR Angiogram Pulmonary Bilateral Selective  Result Date: 08/31/2019 INDICATION: Sub massive pulmonary embolism. Request made for initiation of bilateral catheter directed pulmonary arterial thrombolysis. EXAM: 1. ULTRASOUND GUIDANCE FOR VENOUS ACCESS X2 2. PULMONARY ARTERIOGRAPHY 3. FLUOROSCOPIC GUIDED PLACEMENT OF BILATERAL PULMONARY ARTERIAL LYTIC INFUSION CATHETERS COMPARISON:  Chest CTA-earlier same day MEDICATIONS: None ANESTHESIA/SEDATION: Moderate (conscious) sedation was employed during this procedure. A total of Versed 2.5 mg and Fentanyl 100 mcg was administered intravenously. Moderate Sedation Time: 33 minutes. The patient's level of consciousness and vital signs were monitored continuously by radiology nursing throughout the procedure under my direct supervision. CONTRAST:  23m OMNIPAQUE IOHEXOL 300 MG/ML  SOLN FLUOROSCOPY TIME:  9 minutes, 18 seconds (1545mGy) COMPLICATIONS: None immediate. TECHNIQUE: Informed written consent was obtained from the patient after a discussion of the  risks, benefits and alternatives to treatment. Questions regarding the procedure were encouraged and answered. A timeout was performed prior to the initiation of the procedure. Ultrasound scanning was performed of the right groin and demonstrated near occlusive thrombus within the right common femoral vein. Sonographic evaluation the left groin demonstrates wide patency of the left common femoral vein. As such, the left common femoral vein was selected venous access. The left groin was prepped and draped in the usual sterile fashion, and a sterile drape was applied covering the operative field. Maximum barrier sterile technique with sterile gowns and gloves were used for the procedure. A timeout was performed prior to the initiation of the procedure. Local anesthesia was provided with 1% lidocaine. Under direct ultrasound guidance, the right common femoral vein was accessed with a micro puncture kit ultimately allowing placement of a 6 French, 35 cm vascular sheath. Slightly cranial to this initial access, the right common femoral was again accessed with an additional micropuncture kit ultimately allowing placement of an additional 7 French, 35 cm vascular sheath. Ultrasound images were saved for procedural documentation purposes. With the use of a stiff glidewire, a vertebral catheter was advanced into the main pulmonary artery and a limited central pulmonary arteriogram was performed. Pressure measurements were then obtained from the main pulmonary artery. The vertebral catheter was advanced into the distal branch of the left lower lobe pulmonary artery. Limited contrast injection confirmed appropriate positioning. Over an exchange length Rosen wire, the vertebral catheter was exchanged for a 90/10 cm multi side-hole infusion catheter. Again, with the use of a stiff glidewire, a vertebral catheter was advanced into a distal branch of the right lower lobe pulmonary artery. Limited contrast injection confirmed  appropriate positioning. Over an exchange length Rosen wire, the pigtail catheter was exchanged for a 90/15 cm multi side-hole infusion catheter. A postprocedural fluoroscopic image was obtained to document final catheter positioning. Both vascular sheath were secured at the left groin. The external catheter tubing was secured at the right thigh and the lytic therapy was initiated. The patient tolerated the procedure well without  immediate postprocedural complication. FINDINGS: Central pulmonary arteriogram demonstrates significant near occlusive thrombus involving the peripheral aspects of both the right and left main pulmonary arteries with marked dilatation of the central pulmonary arterial system. Additionally, limited right main pulmonary arteriogram demonstrates near occlusive thrombus within the peripheral aspect of the right main pulmonary artery. Acquired pressure measurements: Main  pulmonary artery-61/16; mean-40 (normal: < 25/10) Following the procedure, both ultrasound assisted infusion catheter tips terminate within the distal aspects of the bilateral lower lobe sub segmental pulmonary arteries. IMPRESSION: 1. Successful fluoroscopic guided initiation of bilateral ultrasound assisted catheter directed pulmonary arterial lysis for sub massive pulmonary embolism and right-sided heart strain. 2. Elevated pressure measurements within the main pulmonary artery compatible with critical pulmonary arterial hypertension. 3. Near occlusive DVT noted within the right common femoral vein. PLAN: Above findings discussed with Dr. Hart Robinsons (critical care) at the time of procedure completion and the decision made to proceed with IVC filter placement for temporary caval interruption purposes at the time of post lysis pressure measurement acquisition. Electronically Signed   By: Sandi Mariscal M.D.   On: 08/31/2019 08:24   IR IVC FILTER PLMT / S&I Burke Keels GUID/MOD SED  Result Date: 08/31/2019 CLINICAL DATA:  Bilateral  submassive pulmonary embolism with cor pulmonale. Status post thrombo lytic infusion via each pulmonary artery for 12 hours. Request has also been made to place an IVC filter on completion thrombo lytic therapy given residual DVT in both lower extremities. EXAM: 1. FINAL DAY ARTERIAL THROMBOLYTIC THERAPY PRESSURE MEASUREMENT. 2. IVC VENOGRAM. 3. PERCUTANEOUS IVC FILTER PLACEMENT. ANESTHESIA/SEDATION: 50 mcg IV Fentanyl. The patient was just given fentanyl and not formally sedated. CONTRAST:  62m OMNIPAQUE IOHEXOL 300 MG/ML SOLN, 143mOMNIPAQUE IOHEXOL 300 MG/ML SOLN, 106mMNIPAQUE IOHEXOL 300 MG/ML SOLN FLUOROSCOPY TIME:  1 minutes and 30 seconds.  146.6 mGy. PROCEDURE: The procedure, risks, benefits, and alternatives were explained to the patient. Questions regarding the procedure were encouraged and answered. The patient understands and consents to the procedure. A time-out was performed prior to initiating the procedure. Pulmonary artery pressures were directly measured through the pulmonary arterial infusion catheters. The catheters were then removed. Left femoral vein sheaths placed yesterday were prepped and draped. Local anesthesia was provided with 1% Lidocaine. A guidewire was advanced into the inferior vena cava via one of the sheaths. This sheath was removed and the venotomy dilated. A 10 French deployment sheath was advanced over the guidewire. This was utilized to perform IVC venography. The deployment sheath was further positioned in an appropriate location for filter deployment. A Bard Denali IVC filter was then advanced in the sheath. This was then fully deployed in the infrarenal IVC. Final filter position was confirmed with a fluoroscopic spot image. After the procedure the sheath was removed and hemostasis obtained with manual compression. COMPLICATIONS: None. FINDINGS: Measure pulmonary artery pressure is 55/20, mean 28 mm Hg compared to 61/16, 40 mm Hg prior to lytic therapy. IVC venography  demonstrates a normal caliber IVC with no evidence of thrombus. Renal veins are identified bilaterally. The IVC filter was successfully positioned below the level of the renal veins and is appropriately oriented. This IVC filter has both permanent and retrievable indications. IMPRESSION: 1. Decrease in measured pulmonary artery pressure after 12 hours of bilateral pulmonary arterial thrombolytic therapy. 2. Placement of percutaneous IVC filter in infrarenal IVC. IVC venogram shows no evidence of IVC thrombus and normal caliber of the inferior vena cava. This filter does have both permanent and retrievable indications. PLAN: This  IVC filter is potentially retrievable. The patient will be assessed for filter retrieval by Interventional Radiology in approximately 8-12 weeks. Further recommendations regarding filter retrieval, continued surveillance or declaration of device permanence, will be made at that time. Electronically Signed   By: Aletta Edouard M.D.   On: 08/31/2019 14:25   IR US Guide Vasc Access Left  Result Date: 08/31/2019 INDICATION: Sub massive pulmonary embolism. Request made for initiation of bilateral catheter directed pulmonary arterial thrombolysis. EXAM: 1. ULTRASOUND GUIDANCE FOR VENOUS ACCESS X2 2. PULMONARY ARTERIOGRAPHY 3. FLUOROSCOPIC GUIDED PLACEMENT OF BILATERAL PULMONARY ARTERIAL LYTIC INFUSION CATHETERS COMPARISON:  Chest CTA-earlier same day MEDICATIONS: None ANESTHESIA/SEDATION: Moderate (conscious) sedation was employed during this procedure. A total of Versed 2.5 mg and Fentanyl 100 mcg was administered intravenously. Moderate Sedation Time: 33 minutes. The patient's level of consciousness and vital signs were monitored continuously by radiology nursing throughout the procedure under my direct supervision. CONTRAST:  47m OMNIPAQUE IOHEXOL 300 MG/ML  SOLN FLUOROSCOPY TIME:  9 minutes, 18 seconds (1709mGy) COMPLICATIONS: None immediate. TECHNIQUE: Informed written consent was  obtained from the patient after a discussion of the risks, benefits and alternatives to treatment. Questions regarding the procedure were encouraged and answered. A timeout was performed prior to the initiation of the procedure. Ultrasound scanning was performed of the right groin and demonstrated near occlusive thrombus within the right common femoral vein. Sonographic evaluation the left groin demonstrates wide patency of the left common femoral vein. As such, the left common femoral vein was selected venous access. The left groin was prepped and draped in the usual sterile fashion, and a sterile drape was applied covering the operative field. Maximum barrier sterile technique with sterile gowns and gloves were used for the procedure. A timeout was performed prior to the initiation of the procedure. Local anesthesia was provided with 1% lidocaine. Under direct ultrasound guidance, the right common femoral vein was accessed with a micro puncture kit ultimately allowing placement of a 6 French, 35 cm vascular sheath. Slightly cranial to this initial access, the right common femoral was again accessed with an additional micropuncture kit ultimately allowing placement of an additional 7 French, 35 cm vascular sheath. Ultrasound images were saved for procedural documentation purposes. With the use of a stiff glidewire, a vertebral catheter was advanced into the main pulmonary artery and a limited central pulmonary arteriogram was performed. Pressure measurements were then obtained from the main pulmonary artery. The vertebral catheter was advanced into the distal branch of the left lower lobe pulmonary artery. Limited contrast injection confirmed appropriate positioning. Over an exchange length Rosen wire, the vertebral catheter was exchanged for a 90/10 cm multi side-hole infusion catheter. Again, with the use of a stiff glidewire, a vertebral catheter was advanced into a distal branch of the right lower lobe  pulmonary artery. Limited contrast injection confirmed appropriate positioning. Over an exchange length Rosen wire, the pigtail catheter was exchanged for a 90/15 cm multi side-hole infusion catheter. A postprocedural fluoroscopic image was obtained to document final catheter positioning. Both vascular sheath were secured at the left groin. The external catheter tubing was secured at the right thigh and the lytic therapy was initiated. The patient tolerated the procedure well without immediate postprocedural complication. FINDINGS: Central pulmonary arteriogram demonstrates significant near occlusive thrombus involving the peripheral aspects of both the right and left main pulmonary arteries with marked dilatation of the central pulmonary arterial system. Additionally, limited right main pulmonary arteriogram demonstrates near occlusive thrombus within the peripheral aspect of  the right main pulmonary artery. Acquired pressure measurements: Main  pulmonary artery-61/16; mean-40 (normal: < 25/10) Following the procedure, both ultrasound assisted infusion catheter tips terminate within the distal aspects of the bilateral lower lobe sub segmental pulmonary arteries. IMPRESSION: 1. Successful fluoroscopic guided initiation of bilateral ultrasound assisted catheter directed pulmonary arterial lysis for sub massive pulmonary embolism and right-sided heart strain. 2. Elevated pressure measurements within the main pulmonary artery compatible with critical pulmonary arterial hypertension. 3. Near occlusive DVT noted within the right common femoral vein. PLAN: Above findings discussed with Dr. Hart Robinsons (critical care) at the time of procedure completion and the decision made to proceed with IVC filter placement for temporary caval interruption purposes at the time of post lysis pressure measurement acquisition. Electronically Signed   By: Sandi Mariscal M.D.   On: 08/31/2019 08:24   IR US Guide Vasc Access Left  Result Date:  08/31/2019 INDICATION: Sub massive pulmonary embolism. Request made for initiation of bilateral catheter directed pulmonary arterial thrombolysis. EXAM: 1. ULTRASOUND GUIDANCE FOR VENOUS ACCESS X2 2. PULMONARY ARTERIOGRAPHY 3. FLUOROSCOPIC GUIDED PLACEMENT OF BILATERAL PULMONARY ARTERIAL LYTIC INFUSION CATHETERS COMPARISON:  Chest CTA-earlier same day MEDICATIONS: None ANESTHESIA/SEDATION: Moderate (conscious) sedation was employed during this procedure. A total of Versed 2.5 mg and Fentanyl 100 mcg was administered intravenously. Moderate Sedation Time: 33 minutes. The patient's level of consciousness and vital signs were monitored continuously by radiology nursing throughout the procedure under my direct supervision. CONTRAST:  59m OMNIPAQUE IOHEXOL 300 MG/ML  SOLN FLUOROSCOPY TIME:  9 minutes, 18 seconds (1656mGy) COMPLICATIONS: None immediate. TECHNIQUE: Informed written consent was obtained from the patient after a discussion of the risks, benefits and alternatives to treatment. Questions regarding the procedure were encouraged and answered. A timeout was performed prior to the initiation of the procedure. Ultrasound scanning was performed of the right groin and demonstrated near occlusive thrombus within the right common femoral vein. Sonographic evaluation the left groin demonstrates wide patency of the left common femoral vein. As such, the left common femoral vein was selected venous access. The left groin was prepped and draped in the usual sterile fashion, and a sterile drape was applied covering the operative field. Maximum barrier sterile technique with sterile gowns and gloves were used for the procedure. A timeout was performed prior to the initiation of the procedure. Local anesthesia was provided with 1% lidocaine. Under direct ultrasound guidance, the right common femoral vein was accessed with a micro puncture kit ultimately allowing placement of a 6 French, 35 cm vascular sheath. Slightly  cranial to this initial access, the right common femoral was again accessed with an additional micropuncture kit ultimately allowing placement of an additional 7 French, 35 cm vascular sheath. Ultrasound images were saved for procedural documentation purposes. With the use of a stiff glidewire, a vertebral catheter was advanced into the main pulmonary artery and a limited central pulmonary arteriogram was performed. Pressure measurements were then obtained from the main pulmonary artery. The vertebral catheter was advanced into the distal branch of the left lower lobe pulmonary artery. Limited contrast injection confirmed appropriate positioning. Over an exchange length Rosen wire, the vertebral catheter was exchanged for a 90/10 cm multi side-hole infusion catheter. Again, with the use of a stiff glidewire, a vertebral catheter was advanced into a distal branch of the right lower lobe pulmonary artery. Limited contrast injection confirmed appropriate positioning. Over an exchange length Rosen wire, the pigtail catheter was exchanged for a 90/15 cm multi side-hole infusion catheter. A postprocedural  fluoroscopic image was obtained to document final catheter positioning. Both vascular sheath were secured at the left groin. The external catheter tubing was secured at the right thigh and the lytic therapy was initiated. The patient tolerated the procedure well without immediate postprocedural complication. FINDINGS: Central pulmonary arteriogram demonstrates significant near occlusive thrombus involving the peripheral aspects of both the right and left main pulmonary arteries with marked dilatation of the central pulmonary arterial system. Additionally, limited right main pulmonary arteriogram demonstrates near occlusive thrombus within the peripheral aspect of the right main pulmonary artery. Acquired pressure measurements: Main  pulmonary artery-61/16; mean-40 (normal: < 25/10) Following the procedure, both  ultrasound assisted infusion catheter tips terminate within the distal aspects of the bilateral lower lobe sub segmental pulmonary arteries. IMPRESSION: 1. Successful fluoroscopic guided initiation of bilateral ultrasound assisted catheter directed pulmonary arterial lysis for sub massive pulmonary embolism and right-sided heart strain. 2. Elevated pressure measurements within the main pulmonary artery compatible with critical pulmonary arterial hypertension. 3. Near occlusive DVT noted within the right common femoral vein. PLAN: Above findings discussed with Dr. Hart Robinsons (critical care) at the time of procedure completion and the decision made to proceed with IVC filter placement for temporary caval interruption purposes at the time of post lysis pressure measurement acquisition. Electronically Signed   By: Sandi Mariscal M.D.   On: 08/31/2019 08:24   DG CHEST PORT 1 VIEW  Result Date: 09/13/2019 CLINICAL DATA:  Respiratory failure EXAM: PORTABLE CHEST 1 VIEW COMPARISON:  September 11, 2019 FINDINGS: The heart size and mediastinal contours are within normal limits. Minimal subsegmental atelectasis seen at both lung bases. The visualized skeletal structures are unremarkable. IMPRESSION: Minimal bibasilar subsegmental atelectasis. Electronically Signed   By: Prudencio Pair M.D.   On: 09/13/2019 05:35   ECHOCARDIOGRAM COMPLETE  Result Date: 09/11/2019    ECHOCARDIOGRAM REPORT   Patient Name:   EHAB HUMBER Date of Exam: 09/11/2019 Medical Rec #:  [id number]     Height:       75.0 in Accession #:    [id number]    Weight:       170.4 lb Date of Birth:  04/05/1951     BSA:          2.050 m Patient Age:    69 years      BP:           116/79 mmHg Patient Gender: M             HR:           77 bpm. Exam Location:  Inpatient Procedure: 2D Echo, Color Doppler and Cardiac Doppler Indications:    I50.9* Heart failure (unspecified)  History:        Patient has prior history of Echocardiogram examinations, most                 recent  08/30/2019. CHF. Acute Massive Pulmonary Embolus on                 08/30/19.  Sonographer:    Raquel Sarna Senior RDCS Referring Phys: [id number] RIPUDEEP K RAI IMPRESSIONS  1. Left ventricular ejection fraction, by estimation, is 50 to 55%. The left ventricle has low normal function. The left ventricle has no regional wall motion abnormalities. Left ventricular diastolic parameters are consistent with Grade I diastolic dysfunction (impaired relaxation). There is the interventricular septum is flattened in diastole ('D' shaped left ventricle), consistent with right ventricular volume overload.  2. Right ventricular systolic function is  normal. The right ventricular size is moderately enlarged. There is moderately elevated pulmonary artery systolic pressure. The estimated right ventricular systolic pressure is 42.3 mmHg.  3. Right atrial size was moderately dilated.  4. The mitral valve is normal in structure and function. Trivial mitral valve regurgitation. No evidence of mitral stenosis.  5. The aortic valve is normal in structure and function. Aortic valve regurgitation is not visualized. No aortic stenosis is present.  6. Aortic dilatation noted. There is mild dilatation of the ascending aorta measuring 37 mm. FINDINGS  Left Ventricle: Left ventricular ejection fraction, by estimation, is 50 to 55%. The left ventricle has low normal function. The left ventricle has no regional wall motion abnormalities. The left ventricular internal cavity size was normal in size. There is no left ventricular hypertrophy. The interventricular septum is flattened in diastole ('D' shaped left ventricle), consistent with right ventricular volume overload. Left ventricular diastolic parameters are consistent with Grade I diastolic dysfunction (impaired relaxation). Right Ventricle: The right ventricular size is moderately enlarged. No increase in right ventricular wall thickness. Right ventricular systolic function is normal. There is moderately  elevated pulmonary artery systolic pressure. The tricuspid regurgitant  velocity is 3.48 m/s, and with an assumed right atrial pressure of 8 mmHg, the estimated right ventricular systolic pressure is 53.6 mmHg. Left Atrium: Left atrial size was normal in size. Right Atrium: Right atrial size was moderately dilated. Pericardium: There is no evidence of pericardial effusion. Mitral Valve: The mitral valve is normal in structure and function. Trivial mitral valve regurgitation. No evidence of mitral valve stenosis. Tricuspid Valve: The tricuspid valve is normal in structure. Tricuspid valve regurgitation is mild. Aortic Valve: The aortic valve is normal in structure and function. Aortic valve regurgitation is not visualized. No aortic stenosis is present. Pulmonic Valve: The pulmonic valve was normal in structure. Pulmonic valve regurgitation is trivial. Aorta: Aortic dilatation noted. There is mild dilatation of the ascending aorta measuring 37 mm. IAS/Shunts: The atrial septum is grossly normal.  LEFT VENTRICLE PLAX 2D LVIDd:         4.95 cm  Diastology LVIDs:         3.20 cm  LV e' lateral:   9.01 cm/s LV PW:         1.00 cm  LV E/e' lateral: 6.1 LV IVS:        1.02 cm  LV e' medial:    6.68 cm/s LVOT diam:     2.70 cm  LV E/e' medial:  8.3 LV SV:         85 LV SV Index:   42 LVOT Area:     5.73 cm  RIGHT VENTRICLE RV S prime:     13.70 cm/s TAPSE (M-mode): 2.2 cm LEFT ATRIUM             Index       RIGHT ATRIUM           Index LA diam:        3.10 cm 1.51 cm/m  RA Area:     26.00 cm LA Vol (A2C):   48.4 ml 23.61 ml/m RA Volume:   90.70 ml  44.24 ml/m LA Vol (A4C):   68.4 ml 33.36 ml/m LA Biplane Vol: 60.7 ml 29.61 ml/m  AORTIC VALVE LVOT Vmax:   96.60 cm/s LVOT Vmean:  61.900 cm/s LVOT VTI:    0.149 m  PULMONARY ARTERY AORTA                    MPA diam:        3.20 cm Ao Root diam: 3.90 cm Ao Asc diam:  3.70 cm MITRAL VALVE               TRICUSPID VALVE MV Area (PHT): 2.79 cm    TR  Peak grad:   48.4 mmHg MV Decel Time: 272 msec    TR Vmax:        348.00 cm/s MV E velocity: 55.30 cm/s MV A velocity: 78.40 cm/s  SHUNTS MV E/A ratio:  0.71        Systemic VTI:  0.15 m                            Systemic Diam: 2.70 cm Mertie Moores MD Electronically signed by Mertie Moores MD Signature Date/Time: 09/11/2019/3:55:30 PM    Final    ECHOCARDIOGRAM COMPLETE  Result Date: 08/30/2019    ECHOCARDIOGRAM REPORT   Patient Name:   PHAROAH GOGGINS Date of Exam: 08/30/2019 Medical Rec #:  [id number]     Height:       75.0 in Accession #:    [id number]    Weight:       172.0 lb Date of Birth:  [birth year]     BSA:          2.06 m Patient Age:    12 years      BP:           122/87 mmHg Patient Gender: M             HR:           98 bpm. Exam Location:  Inpatient Procedure: 2D Echo, Cardiac Doppler and Color Doppler STAT ECHO Indications:    R55 Syncope  History:        Patient has no prior history of Echocardiogram examinations.                 Aortic Rood Dilatation.  Sonographer:    Jonelle Sidle Dance Referring Phys: [id number] [place]  1. Severe RA/RV dilation with reduced RV function and severe pulmonary hypertension. LV hyperdynamic and borderline small cavity/underfilled. No hemodynamically significant valve disease.  2. Left ventricular ejection fraction, by estimation, is 65 to 70%. The left ventricle has hyperdynamic function. The left ventricle has no regional wall motion abnormalities. There is moderate concentric left ventricular hypertrophy. Left ventricular diastolic function could not be evaluated. There is the interventricular septum is flattened in systole and diastole, consistent with right ventricular pressure and volume overload.  3. Right ventricular systolic function is moderately reduced. The right ventricular size is severely enlarged. There is severely elevated pulmonary artery systolic pressure. The estimated right ventricular systolic pressure is 97.4 mmHg.  4. Right  atrial size was severely dilated.  5. The mitral valve is normal in structure and function. Trivial mitral valve regurgitation. No evidence of mitral stenosis.  6. Tricuspid valve regurgitation is moderate.  7. The aortic valve is tricuspid. Aortic valve regurgitation is trivial. No aortic stenosis is present.  8. Aortic dilatation noted. There is mild dilatation of the ascending aorta measuring 37 mm.  9. Mildly dilated pulmonary artery. 10. The inferior vena cava is dilated in size with <50% respiratory variability, suggesting right atrial pressure of 15 mmHg. 11. Cannot exclude IAS shunt. Comparison(s):  No prior Echocardiogram. FINDINGS  Left Ventricle: Left ventricular ejection fraction, by estimation, is 65 to 70%. The left ventricle has hyperdynamic function. The left ventricle has no regional wall motion abnormalities. The left ventricular internal cavity size was small. There is moderate concentric left ventricular hypertrophy. The interventricular septum is flattened in systole and diastole, consistent with right ventricular pressure and volume overload. Left ventricular diastolic function could not be evaluated. Right Ventricle: The right ventricular size is severely enlarged. Right vetricular wall thickness was not assessed. Right ventricular systolic function is moderately reduced. There is severely elevated pulmonary artery systolic pressure. The tricuspid regurgitant velocity is 3.91 m/s, and with an assumed right atrial pressure of 15 mmHg, the estimated right ventricular systolic pressure is 07.1 mmHg. Left Atrium: Left atrial size was normal in size. Right Atrium: Right atrial size was severely dilated. Pericardium: There is no evidence of pericardial effusion. Mitral Valve: The mitral valve is normal in structure and function. Trivial mitral valve regurgitation. No evidence of mitral valve stenosis. Tricuspid Valve: The tricuspid valve is normal in structure. Tricuspid valve regurgitation is  moderate . No evidence of tricuspid stenosis. Aortic Valve: The aortic valve is tricuspid. Aortic valve regurgitation is trivial. No aortic stenosis is present. Pulmonic Valve: The pulmonic valve was grossly normal. Pulmonic valve regurgitation is mild. No evidence of pulmonic stenosis. Aorta: Aortic dilatation noted. There is mild dilatation of the ascending aorta measuring 37 mm. Pulmonary Artery: The pulmonary artery is mildly dilated. Venous: The inferior vena cava is dilated in size with less than 50% respiratory variability, suggesting right atrial pressure of 15 mmHg. IAS/Shunts: Cannot exclude IAS shunt. Additional Comments: There is a small pleural effusion in both left and right lateral regions.  LEFT VENTRICLE PLAX 2D LVIDd:         3.33 cm LVIDs:         2.20 cm LV PW:         1.75 cm LV IVS:        2.02 cm LVOT diam:     2.40 cm LV SV:         72.61 ml LV SV Index:   14.28 LVOT Area:     4.52 cm  RIGHT VENTRICLE          IVC RV Basal diam:  4.20 cm  IVC diam: 2.20 cm RV Mid diam:    3.00 cm TAPSE (M-mode): 1.4 cm LEFT ATRIUM             Index       RIGHT ATRIUM           Index LA diam:        3.40 cm 1.65 cm/m  RA Area:     39.50 cm LA Vol (A2C):   42.1 ml 20.46 ml/m RA Volume:   182.50 ml 88.67 ml/m LA Vol (A4C):   43.0 ml 20.89 ml/m LA Biplane Vol: 42.4 ml 20.60 ml/m  AORTIC VALVE LVOT Vmax:   102.40 cm/s LVOT Vmean:  57.500 cm/s LVOT VTI:    0.160 m  AORTA Ao Root diam: 4.20 cm Ao Asc diam:  3.70 cm MV A velocity: 78.45 cm/s  TRICUSPID VALVE                            TR Peak grad:   61.2 mmHg  TR Vmax:        391.00 cm/s                             SHUNTS                            Systemic VTI:  0.16 m                            Systemic Diam: 2.40 cm Buford Dresser MD Electronically signed by Buford Dresser MD Signature Date/Time: 08/30/2019/12:48:37 PM    Final    VAS [place] LOWER EXTREMITY VENOUS (DVT)  Result Date: 08/30/2019  Lower Venous DVTStudy  Indications: Pulmonary embolism, and SOB.  Comparison Study: No prior exam. Performing Technologist: Baldwin Crown ARDMS, RVT  Examination Guidelines: A complete evaluation includes B-mode imaging, spectral Doppler, color Doppler, and power Doppler as needed of all accessible portions of each vessel. Bilateral testing is considered an integral part of a complete examination. Limited examinations for reoccurring indications may be performed as noted. The reflux portion of the exam is performed with the patient in reverse Trendelenburg.  +---------+---------------+---------+-----------+----------+---------------+ RIGHT    CompressibilityPhasicitySpontaneityPropertiesThrombus Aging  +---------+---------------+---------+-----------+----------+---------------+ CFV      Partial        Yes      Yes                                  +---------+---------------+---------+-----------+----------+---------------+ SFJ      None                                                         +---------+---------------+---------+-----------+----------+---------------+ FV Prox  Full                                                         +---------+---------------+---------+-----------+----------+---------------+ FV Mid   Full                                                         +---------+---------------+---------+-----------+----------+---------------+ FV DistalFull                                                         +---------+---------------+---------+-----------+----------+---------------+ PFV      Full                                                         +---------+---------------+---------+-----------+----------+---------------+ POP      Full  Yes      Yes                                  +---------+---------------+---------+-----------+----------+---------------+ PTV      Full                                         seen with color  +---------+---------------+---------+-----------+----------+---------------+ PERO     Full                                         seen with color +---------+---------------+---------+-----------+----------+---------------+ EIV      Full           Yes      Yes                                  +---------+---------------+---------+-----------+----------+---------------+   +---------+---------------+---------+-----------+----------+---------------+ LEFT     CompressibilityPhasicitySpontaneityPropertiesThrombus Aging  +---------+---------------+---------+-----------+----------+---------------+ CFV      None           No       No                                   +---------+---------------+---------+-----------+----------+---------------+ SFJ      Full                                                         +---------+---------------+---------+-----------+----------+---------------+ FV Prox  Full                                                         +---------+---------------+---------+-----------+----------+---------------+ FV Mid   Full                                                         +---------+---------------+---------+-----------+----------+---------------+ FV DistalNone                                                         +---------+---------------+---------+-----------+----------+---------------+ PFV      Full                                                         +---------+---------------+---------+-----------+----------+---------------+ POP      None  No       No                                   +---------+---------------+---------+-----------+----------+---------------+ PTV      Full                                         seen with color +---------+---------------+---------+-----------+----------+---------------+ PERO     Full                                         seen with color  +---------+---------------+---------+-----------+----------+---------------+ EIV      Full                                                         +---------+---------------+---------+-----------+----------+---------------+ Poorly visusalized bilteral calf veins, visualized with color flow.    Summary: RIGHT: - Findings consistent with acute deep vein thrombosis involving the SF junction, and right common femoral vein. - Findings consistent with acute superficial vein thrombosis involving the right great saphenous vein.  LEFT: - Findings consistent with acute deep vein thrombosis involving the left common femoral vein, left femoral vein, and left popliteal vein.  *See table(s) above for measurements and observations. Electronically signed by Harold Barban MD on 08/30/2019 at 6:38:23 PM.    Final    US Abdomen Limited RUQ  Result Date: 09/10/2019 CLINICAL DATA:  Transaminitis EXAM: ULTRASOUND ABDOMEN LIMITED RIGHT UPPER QUADRANT COMPARISON:  None. FINDINGS: Gallbladder: No gallstones or wall thickening visualized. No sonographic Murphy sign noted by sonographer. Common bile duct: Diameter: 3.5 mm Liver: No focal lesion identified. Within normal limits in parenchymal echogenicity. Portal vein is patent on color Doppler imaging with normal direction of blood flow towards the liver. Other: None. IMPRESSION: Normal study. Electronically Signed   By: Constance Holster M.D.   On: 09/10/2019 19:29   IR INFUSION THROMBOL ARTERIAL INITIAL (MS)  Result Date: 08/31/2019 INDICATION: Sub massive pulmonary embolism. Request made for initiation of bilateral catheter directed pulmonary arterial thrombolysis. EXAM: 1. ULTRASOUND GUIDANCE FOR VENOUS ACCESS X2 2. PULMONARY ARTERIOGRAPHY 3. FLUOROSCOPIC GUIDED PLACEMENT OF BILATERAL PULMONARY ARTERIAL LYTIC INFUSION CATHETERS COMPARISON:  Chest CTA-earlier same day MEDICATIONS: None ANESTHESIA/SEDATION: Moderate (conscious) sedation was employed during this procedure. A  total of Versed 2.5 mg and Fentanyl 100 mcg was administered intravenously. Moderate Sedation Time: 33 minutes. The patient's level of consciousness and vital signs were monitored continuously by radiology nursing throughout the procedure under my direct supervision. CONTRAST:  53m OMNIPAQUE IOHEXOL 300 MG/ML  SOLN FLUOROSCOPY TIME:  9 minutes, 18 seconds (1094mGy) COMPLICATIONS: None immediate. TECHNIQUE: Informed written consent was obtained from the patient after a discussion of the risks, benefits and alternatives to treatment. Questions regarding the procedure were encouraged and answered. A timeout was performed prior to the initiation of the procedure. Ultrasound scanning was performed of the right groin and demonstrated near occlusive thrombus within the right common femoral vein. Sonographic evaluation the left groin demonstrates wide patency of the left common femoral vein. As such, the left common femoral vein  was selected venous access. The left groin was prepped and draped in the usual sterile fashion, and a sterile drape was applied covering the operative field. Maximum barrier sterile technique with sterile gowns and gloves were used for the procedure. A timeout was performed prior to the initiation of the procedure. Local anesthesia was provided with 1% lidocaine. Under direct ultrasound guidance, the right common femoral vein was accessed with a micro puncture kit ultimately allowing placement of a 6 French, 35 cm vascular sheath. Slightly cranial to this initial access, the right common femoral was again accessed with an additional micropuncture kit ultimately allowing placement of an additional 7 French, 35 cm vascular sheath. Ultrasound images were saved for procedural documentation purposes. With the use of a stiff glidewire, a vertebral catheter was advanced into the main pulmonary artery and a limited central pulmonary arteriogram was performed. Pressure measurements were then obtained from  the main pulmonary artery. The vertebral catheter was advanced into the distal branch of the left lower lobe pulmonary artery. Limited contrast injection confirmed appropriate positioning. Over an exchange length Rosen wire, the vertebral catheter was exchanged for a 90/10 cm multi side-hole infusion catheter. Again, with the use of a stiff glidewire, a vertebral catheter was advanced into a distal branch of the right lower lobe pulmonary artery. Limited contrast injection confirmed appropriate positioning. Over an exchange length Rosen wire, the pigtail catheter was exchanged for a 90/15 cm multi side-hole infusion catheter. A postprocedural fluoroscopic image was obtained to document final catheter positioning. Both vascular sheath were secured at the left groin. The external catheter tubing was secured at the right thigh and the lytic therapy was initiated. The patient tolerated the procedure well without immediate postprocedural complication. FINDINGS: Central pulmonary arteriogram demonstrates significant near occlusive thrombus involving the peripheral aspects of both the right and left main pulmonary arteries with marked dilatation of the central pulmonary arterial system. Additionally, limited right main pulmonary arteriogram demonstrates near occlusive thrombus within the peripheral aspect of the right main pulmonary artery. Acquired pressure measurements: Main  pulmonary artery-61/16; mean-40 (normal: < 25/10) Following the procedure, both ultrasound assisted infusion catheter tips terminate within the distal aspects of the bilateral lower lobe sub segmental pulmonary arteries. IMPRESSION: 1. Successful fluoroscopic guided initiation of bilateral ultrasound assisted catheter directed pulmonary arterial lysis for sub massive pulmonary embolism and right-sided heart strain. 2. Elevated pressure measurements within the main pulmonary artery compatible with critical pulmonary arterial hypertension. 3. Near  occlusive DVT noted within the right common femoral vein. PLAN: Above findings discussed with Dr. Hart Robinsons (critical care) at the time of procedure completion and the decision made to proceed with IVC filter placement for temporary caval interruption purposes at the time of post lysis pressure measurement acquisition. Electronically Signed   By: Sandi Mariscal M.D.   On: 08/31/2019 08:24   IR INFUSION THROMBOL ARTERIAL INITIAL (MS)  Result Date: 08/31/2019 INDICATION: Sub massive pulmonary embolism. Request made for initiation of bilateral catheter directed pulmonary arterial thrombolysis. EXAM: 1. ULTRASOUND GUIDANCE FOR VENOUS ACCESS X2 2. PULMONARY ARTERIOGRAPHY 3. FLUOROSCOPIC GUIDED PLACEMENT OF BILATERAL PULMONARY ARTERIAL LYTIC INFUSION CATHETERS COMPARISON:  Chest CTA-earlier same day MEDICATIONS: None ANESTHESIA/SEDATION: Moderate (conscious) sedation was employed during this procedure. A total of Versed 2.5 mg and Fentanyl 100 mcg was administered intravenously. Moderate Sedation Time: 33 minutes. The patient's level of consciousness and vital signs were monitored continuously by radiology nursing throughout the procedure under my direct supervision. CONTRAST:  34m OMNIPAQUE IOHEXOL 300 MG/ML  SOLN FLUOROSCOPY  TIME:  9 minutes, 18 seconds (295 mGy) COMPLICATIONS: None immediate. TECHNIQUE: Informed written consent was obtained from the patient after a discussion of the risks, benefits and alternatives to treatment. Questions regarding the procedure were encouraged and answered. A timeout was performed prior to the initiation of the procedure. Ultrasound scanning was performed of the right groin and demonstrated near occlusive thrombus within the right common femoral vein. Sonographic evaluation the left groin demonstrates wide patency of the left common femoral vein. As such, the left common femoral vein was selected venous access. The left groin was prepped and draped in the usual sterile fashion, and a  sterile drape was applied covering the operative field. Maximum barrier sterile technique with sterile gowns and gloves were used for the procedure. A timeout was performed prior to the initiation of the procedure. Local anesthesia was provided with 1% lidocaine. Under direct ultrasound guidance, the right common femoral vein was accessed with a micro puncture kit ultimately allowing placement of a 6 French, 35 cm vascular sheath. Slightly cranial to this initial access, the right common femoral was again accessed with an additional micropuncture kit ultimately allowing placement of an additional 7 French, 35 cm vascular sheath. Ultrasound images were saved for procedural documentation purposes. With the use of a stiff glidewire, a vertebral catheter was advanced into the main pulmonary artery and a limited central pulmonary arteriogram was performed. Pressure measurements were then obtained from the main pulmonary artery. The vertebral catheter was advanced into the distal branch of the left lower lobe pulmonary artery. Limited contrast injection confirmed appropriate positioning. Over an exchange length Rosen wire, the vertebral catheter was exchanged for a 90/10 cm multi side-hole infusion catheter. Again, with the use of a stiff glidewire, a vertebral catheter was advanced into a distal branch of the right lower lobe pulmonary artery. Limited contrast injection confirmed appropriate positioning. Over an exchange length Rosen wire, the pigtail catheter was exchanged for a 90/15 cm multi side-hole infusion catheter. A postprocedural fluoroscopic image was obtained to document final catheter positioning. Both vascular sheath were secured at the left groin. The external catheter tubing was secured at the right thigh and the lytic therapy was initiated. The patient tolerated the procedure well without immediate postprocedural complication. FINDINGS: Central pulmonary arteriogram demonstrates significant near  occlusive thrombus involving the peripheral aspects of both the right and left main pulmonary arteries with marked dilatation of the central pulmonary arterial system. Additionally, limited right main pulmonary arteriogram demonstrates near occlusive thrombus within the peripheral aspect of the right main pulmonary artery. Acquired pressure measurements: Main  pulmonary artery-61/16; mean-40 (normal: < 25/10) Following the procedure, both ultrasound assisted infusion catheter tips terminate within the distal aspects of the bilateral lower lobe sub segmental pulmonary arteries. IMPRESSION: 1. Successful fluoroscopic guided initiation of bilateral ultrasound assisted catheter directed pulmonary arterial lysis for sub massive pulmonary embolism and right-sided heart strain. 2. Elevated pressure measurements within the main pulmonary artery compatible with critical pulmonary arterial hypertension. 3. Near occlusive DVT noted within the right common femoral vein. PLAN: Above findings discussed with Dr. Hart Robinsons (critical care) at the time of procedure completion and the decision made to proceed with IVC filter placement for temporary caval interruption purposes at the time of post lysis pressure measurement acquisition. Electronically Signed   By: Sandi Mariscal M.D.   On: 08/31/2019 08:24   IR THROMB F/U EVAL ART/VEN FINAL DAY (MS)  Result Date: 08/31/2019 CLINICAL DATA:  Bilateral submassive pulmonary embolism with cor pulmonale. Status post  thrombo lytic infusion via each pulmonary artery for 12 hours. Request has also been made to place an IVC filter on completion thrombo lytic therapy given residual DVT in both lower extremities. EXAM: 1. FINAL DAY ARTERIAL THROMBOLYTIC THERAPY PRESSURE MEASUREMENT. 2. IVC VENOGRAM. 3. PERCUTANEOUS IVC FILTER PLACEMENT. ANESTHESIA/SEDATION: 50 mcg IV Fentanyl. The patient was just given fentanyl and not formally sedated. CONTRAST:  71m OMNIPAQUE IOHEXOL 300 MG/ML SOLN, 118m OMNIPAQUE IOHEXOL 300 MG/ML SOLN, 1041mMNIPAQUE IOHEXOL 300 MG/ML SOLN FLUOROSCOPY TIME:  1 minutes and 30 seconds.  146.6 mGy. PROCEDURE: The procedure, risks, benefits, and alternatives were explained to the patient. Questions regarding the procedure were encouraged and answered. The patient understands and consents to the procedure. A time-out was performed prior to initiating the procedure. Pulmonary artery pressures were directly measured through the pulmonary arterial infusion catheters. The catheters were then removed. Left femoral vein sheaths placed yesterday were prepped and draped. Local anesthesia was provided with 1% Lidocaine. A guidewire was advanced into the inferior vena cava via one of the sheaths. This sheath was removed and the venotomy dilated. A 10 French deployment sheath was advanced over the guidewire. This was utilized to perform IVC venography. The deployment sheath was further positioned in an appropriate location for filter deployment. A Bard Denali IVC filter was then advanced in the sheath. This was then fully deployed in the infrarenal IVC. Final filter position was confirmed with a fluoroscopic spot image. After the procedure the sheath was removed and hemostasis obtained with manual compression. COMPLICATIONS: None. FINDINGS: Measure pulmonary artery pressure is 55/20, mean 28 mm Hg compared to 61/16, 40 mm Hg prior to lytic therapy. IVC venography demonstrates a normal caliber IVC with no evidence of thrombus. Renal veins are identified bilaterally. The IVC filter was successfully positioned below the level of the renal veins and is appropriately oriented. This IVC filter has both permanent and retrievable indications. IMPRESSION: 1. Decrease in measured pulmonary artery pressure after 12 hours of bilateral pulmonary arterial thrombolytic therapy. 2. Placement of percutaneous IVC filter in infrarenal IVC. IVC venogram shows no evidence of IVC thrombus and normal caliber of the  inferior vena cava. This filter does have both permanent and retrievable indications. PLAN: This IVC filter is potentially retrievable. The patient will be assessed for filter retrieval by Interventional Radiology in approximately 8-12 weeks. Further recommendations regarding filter retrieval, continued surveillance or declaration of device permanence, will be made at that time. Electronically Signed   By: GleAletta EdouardD.   On: 08/31/2019 14:25     ELIGIBLE FOR AVAILABLE RESEARCH PROTOCOL: no  ASSESSMENT: [age]. 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  1. Unprovoked DVT and PE on 08/30/2019 (a) s/p catheter directed TPA 2/17 and removable IVC filter placement 08/31/2019 (b) Rivaroxaban BID at 15 mg (c) hypercoagulable panel negative  2. Hemoptysis 09/10/2019 and subsequent (a) Echo 09/10/2020, EF 50-55%  (b) Bronchoscopy on 09/12/2019 shows no active bleeding or etiology (c) EGD 09/15/2019 showed normal esophagus stomach and duodenum             (d) Rivaroxaban at 44m78mily, consider increasing to 20 mg daily beginning 10/13/2019 versus continuing at 10 mg daily  3. Positive blood and urine culture: E Coli on 2/28-pan sensitive: Treated, resolved  4. Deconditioning (a) status post physical therapy, with significant improvement  5. anemia: Moderate   PLAN: Mr. BogeWarehimerecovering uneventfully and we reviewed his situation from baseline today.  He understands he had a  life-threatening clot which moved from his legs to his lungs.  A filter was placed to keep further clots from moving up and he was started on blood thinners, which will need to continue lifelong.  Because he was having hemoptysis the blood thinner dose was cut in half from the standard dose.  He had extensive evaluation with bronchoscopy and EGD which showed no obvious source of bleeding.  The hemoptysis  appears to have stopped.  The question is where to go from here.  What we are going to do is see is going to continue 15 mg of rivaroxaban daily for the next 21 days.  Those are the pills that he has on hand.  On April 1, before he runs out of those, I will do a phone visit and if he has had no further hemoptysis I will switch him to rivaroxaban 20 mg daily, which is the standard dose, and which he also already has on hand.  I will then repeat Dopplers of the lower extremities and do a visit early May and if all looks well we will move to remove the IVC filter and continue him on 20 mg of rivaroxaban daily for a minimum of 6 months after which we can consider going to 10 mg daily  I have suggested he take 1 iron pill daily with food for the next few weeks so that over the next few weeks his hemoglobin can correct itself.  He has a good understanding of this plan.  He agrees with it.  He knows to call us if any other problem develops before his next visit.   Total encounter time 40 minutes.*. Virgie Dad. Khaylee Mcevoy, MD 09/22/2019 10:05 AM Medical Oncology and Hematology [facility] [address] [place], [place] [address] Tel. [phone]    Fax. [phone]   This document serves as a record of services personally performed by Lurline Del, MD. It was created on his behalf by Wilburn Mylar, a trained medical scribe. The creation of this record is based on the scribe's personal observations and the provider's statements to them.   I, Lurline Del MD, have reviewed the above documentation for accuracy and completeness, and I agree with the above.    *Total Encounter Time as defined by the Centers for Medicare and Medicaid Services includes, in addition to the face-to-face time of a patient visit (documented in the note above) non-face-to-face time: obtaining and reviewing outside history, ordering and reviewing medications, tests or procedures, care coordination  (communications with other health care professionals or caregivers) and documentation in the medical record.

## 2019-09-22 ENCOUNTER — Other Ambulatory Visit: Payer: Self-pay

## 2019-09-22 ENCOUNTER — Inpatient Hospital Stay: Payer: BC Managed Care – PPO

## 2019-09-22 ENCOUNTER — Inpatient Hospital Stay: Payer: BC Managed Care – PPO | Attending: Oncology | Admitting: Oncology

## 2019-09-22 VITALS — BP 130/84 | HR 79 | Temp 98.9°F | Resp 16 | Ht 75.0 in | Wt 157.4 lb

## 2019-09-22 DIAGNOSIS — I2699 Other pulmonary embolism without acute cor pulmonale: Secondary | ICD-10-CM

## 2019-09-22 DIAGNOSIS — R042 Hemoptysis: Secondary | ICD-10-CM

## 2019-09-22 DIAGNOSIS — Z7901 Long term (current) use of anticoagulants: Secondary | ICD-10-CM | POA: Diagnosis not present

## 2019-09-22 DIAGNOSIS — I82401 Acute embolism and thrombosis of unspecified deep veins of right lower extremity: Secondary | ICD-10-CM

## 2019-09-22 DIAGNOSIS — D649 Anemia, unspecified: Secondary | ICD-10-CM | POA: Insufficient documentation

## 2019-09-22 DIAGNOSIS — Z86718 Personal history of other venous thrombosis and embolism: Secondary | ICD-10-CM | POA: Insufficient documentation

## 2019-09-22 DIAGNOSIS — Z86711 Personal history of pulmonary embolism: Secondary | ICD-10-CM | POA: Diagnosis not present

## 2019-09-22 LAB — CBC WITH DIFFERENTIAL/PLATELET
Abs Immature Granulocytes: 0.06 10*3/uL (ref 0.00–0.07)
Basophils Absolute: 0.1 10*3/uL (ref 0.0–0.1)
Basophils Relative: 1 %
Eosinophils Absolute: 0.1 10*3/uL (ref 0.0–0.5)
Eosinophils Relative: 2 %
HCT: 30.9 % — ABNORMAL LOW (ref 39.0–52.0)
Hemoglobin: 9.9 g/dL — ABNORMAL LOW (ref 13.0–17.0)
Immature Granulocytes: 1 %
Lymphocytes Relative: 17 %
Lymphs Abs: 1.1 10*3/uL (ref 0.7–4.0)
MCH: 29.6 pg (ref 26.0–34.0)
MCHC: 32 g/dL (ref 30.0–36.0)
MCV: 92.5 fL (ref 80.0–100.0)
Monocytes Absolute: 0.6 10*3/uL (ref 0.1–1.0)
Monocytes Relative: 9 %
Neutro Abs: 4.5 10*3/uL (ref 1.7–7.7)
Neutrophils Relative %: 70 %
Platelets: 458 10*3/uL — ABNORMAL HIGH (ref 150–400)
RBC: 3.34 MIL/uL — ABNORMAL LOW (ref 4.22–5.81)
RDW: 13.8 % (ref 11.5–15.5)
WBC: 6.4 10*3/uL (ref 4.0–10.5)
nRBC: 0 % (ref 0.0–0.2)

## 2019-09-22 LAB — COMPREHENSIVE METABOLIC PANEL
ALT: 55 U/L — ABNORMAL HIGH (ref 0–44)
AST: 38 U/L (ref 15–41)
Albumin: 2.8 g/dL — ABNORMAL LOW (ref 3.5–5.0)
Alkaline Phosphatase: 156 U/L — ABNORMAL HIGH (ref 38–126)
Anion gap: 7 (ref 5–15)
BUN: 12 mg/dL (ref 8–23)
CO2: 23 mmol/L (ref 22–32)
Calcium: 10.3 mg/dL (ref 8.9–10.3)
Chloride: 106 mmol/L (ref 98–111)
Creatinine, Ser: 0.76 mg/dL (ref 0.61–1.24)
GFR calc Af Amer: >60 mL/min (ref >60)
GFR calc non Af Amer: >60 mL/min (ref >60)
Glucose, Bld: 93 mg/dL (ref 70–99)
Potassium: 3.8 mmol/L (ref 3.5–5.1)
Sodium: 136 mmol/L (ref 135–145)
Total Bilirubin: 0.6 mg/dL (ref 0.3–1.2)
Total Protein: 7.2 g/dL (ref 6.5–8.1)

## 2019-09-28 ENCOUNTER — Telehealth: Payer: Self-pay | Admitting: Oncology

## 2019-09-28 NOTE — Telephone Encounter (Signed)
Scheduled per 03/12 los, patient has been called and notified. 

## 2019-10-03 ENCOUNTER — Ambulatory Visit: Payer: BC Managed Care – PPO | Attending: Family

## 2019-10-03 DIAGNOSIS — Z23 Encounter for immunization: Secondary | ICD-10-CM

## 2019-10-03 NOTE — Progress Notes (Signed)
   Covid-19 Vaccination Clinic  Name:  KEANE MARTELLI    MRN: [id number] DOB: Apr 07, 1951  10/03/2019  Mr. Bowersox was observed post Covid-19 immunization for 15 minutes without incident. He was provided with Vaccine Information Sheet and instruction to access the V-Safe system.   Mr. Barrack was instructed to call 911 with any severe reactions post vaccine: Marland Kitchen Difficulty breathing  . Swelling of face and throat  . A fast heartbeat  . A bad rash all over body  . Dizziness and weakness   Immunizations Administered    Name Date Dose VIS Date Route   Moderna COVID-19 Vaccine 10/03/2019  2:52 PM 0.5 mL 06/13/2019 Intramuscular   Manufacturer: Moderna   Lot: [id number]   NDC: 70929-574-73

## 2019-10-11 LAB — FUNGUS CULTURE WITH STAIN

## 2019-10-11 LAB — FUNGUS CULTURE RESULT

## 2019-10-11 LAB — FUNGAL ORGANISM REFLEX

## 2019-10-11 NOTE — Progress Notes (Signed)
[place]  Telephone:([phone]) [phone] Fax:(336) [phone]     ID: Joe Cantu DOB: 05-31-1951  MR#: [id number]  BSW#:[id number]  Patient Care Team: Joe Beard, MD as PCP - General (Family Medicine) Joe [place], Joe Dad, MD as Consulting Physician (Oncology) Joe Cruel, MD OTHER MD:  I connected with Joe Cantu on 10/12/19 at 11:00 AM EDT by telephone visit and verified that I am speaking with the correct person using two identifiers.   I discussed the limitations, risks, security and privacy concerns of performing an evaluation and management service by telemedicine and the availability of in-person appointments. I also discussed with the patient that there may be a patient responsible charge related to this service. The patient expressed understanding and agreed to proceed.   Other persons participating in the visit and their role in the encounter: none  Patient's location: home  Provider's location: [place]: massive PE and left lower extremity DVT complicated by hemoptysis while on Xarelto  CURRENT TREATMENT: Rivaroxaban; reversible filter in place   INTERVAL HISTORY: Joe Cantu was contacted today for follow up of his pulmonary embolism and left leg DVT.  To recap: He had a massive PE, was initially anticoagulated with heparin then switched to rivaroxaban and then developed hemoptysis.  He stabilized and was discharged on 15 mg daily instead of the standard 15 mg twice daily.  He does have a reversible filter in place.  He tells me that he has had no further episodes of hemoptysis or any bleeding except if he blows his nose very vigorously there may be a little bit of dried blood in the tissue.  He is trying to walk, up to 2 blocks at a time and he also walks inside his house.  He does not have significant breathing problems, no pleurisy, and only a minimal cough in the morning when he wakes up.  He has no leg swelling.  He  has had both his vaccine doses already.   REVIEW OF SYSTEMS: A detailed review of systems today was otherwise noncontributory   HISTORY OF CURRENT ILLNESS: From the original intake note:   Joe [place] is a 69 year-old [place], [place] with a past medical history significant for PVCs, aortic root dilatation, recent diagnosis of acute massive PE with significant right heart dysfunction and left lower extremity DVT in February 2021.    He was hospitalized 08/30/2019 through 09/02/2019 for the PE and DVT.  With extensive questioning this appears to have been an unprovoked PE.  The right pulmonary artery was completely opacified, with no flow to the right lung, with significant clot in the left side as well.  He received catheter directed TPA and was placed on a heparin drip with transition to Xarelto.  He had a retrievable IVC filter placed on 08/31/2019 because there was residual clot in the legs  The patient did well at home after discharge although he was still "taking it slow".  On  09/09/2019 he developed a persistent cough and eventually hemoptysis.  He could not quantify how much blood he coughed up however per EMS report there was approximately 300 to 600 cc of blood in a juice jug.    In the ER, his alkaline phosphatase was 211, albumin 2.3, AST 134, ALT 110, WBC 14.2, hemoglobin 10.4, INR 2.7.  His Xarelto was placed on hold and pulmonology has been consulted.  They recommended a CT of the chest and possible bronchoscopy which was  done today and shows improvement but persistence of the bilateral PEs, with no obvious cause of the bleeding.  Due to concern for possible aspiration during his coughing episodes, he is currently on IV antibiotics.    Note that during his last hospitalization, a hyper coag panel was sent which was unrevealing with the exception of low protein S activity at 49%, likely reactive, heterozygosity for factor V Leiden.  We are asked to see the patient to make  recommendations regarding anticoagulation in this patient with recent diagnosis of massive PE, left lower extremity DVT, and now with hemoptysis while on Xarelto.  The patient's subsequent history is as detailed below.   PAST MEDICAL HISTORY: Past Medical History:  Diagnosis Date  . Aortic root dilatation (HCC) 02/21/2019  . Dyspnea on exertion   . Frequent unifocal PVCs 02/21/2019  . Glaucoma   . Gonorrhea    18 & 19 yrs of age; negative for syphillis, herpes and HIV  . Left inguinal hernia   . Pneumonia   . Ventricular premature beats   . Wears dentures     PAST SURGICAL HISTORY: Past Surgical History:  Procedure Laterality Date  . BRONCHIAL WASHINGS  09/12/2019   Procedure: BRONCHIAL WASHINGS;  Surgeon: Joe Greathouse, MD;  Location: [facility];  Service: Cardiopulmonary;;  . COLONOSCOPY  07/2018   Benign polyps   . ESOPHAGOGASTRODUODENOSCOPY (EGD) WITH PROPOFOL N/A 09/15/2019   Procedure: ESOPHAGOGASTRODUODENOSCOPY (EGD) WITH PROPOFOL;  Surgeon: Joe Hawking, MD;  Location: [facility];  Service: Endoscopy;  Laterality: N/A;  . HIATAL HERNIA REPAIR    . IR ANGIOGRAM PULMONARY BILATERAL SELECTIVE  08/30/2019  . IR INFUSION THROMBOL ARTERIAL INITIAL (MS)  08/30/2019  . IR INFUSION THROMBOL ARTERIAL INITIAL (MS)  08/30/2019  . IR IVC FILTER PLMT / S&I /IMG GUID/MOD SED  08/31/2019  . IR THROMB F/U EVAL ART/VEN FINAL DAY (MS)  08/31/2019  . IR US GUIDE VASC ACCESS LEFT  08/30/2019  . IR US GUIDE VASC ACCESS LEFT  08/30/2019  . VIDEO BRONCHOSCOPY N/A 09/12/2019   Procedure: VIDEO BRONCHOSCOPY WITHOUT FLUORO;  Surgeon: Joe Greathouse, MD;  Location: [facility];  Service: Cardiopulmonary;  Laterality: N/A;    FAMILY HISTORY: Family History  Problem Relation Age of Onset  . Diabetes Mother   . Cancer Father        colon  . Diabetes Sister   . Diabetes Brother      SOCIAL HISTORY: (updated 09/2019)  Joe [place] is single.  He reports that he has 6 children who live throughout [place].  He lives with his fiance Joe [place], who works as a bus attendant, and shepherds children to school buses.  The patient himself works in Production designer, theatre/television/film at [employer].  Denies history of alcohol use.  He previously smoked 1/2 pack/day of cigarettes for approximately 10 years but quit back in about 1977.    ADVANCED DIRECTIVES: The patient tells me his sister is [name] is his healthcare power of attorney.  She can be reached at [phone].   HEALTH MAINTENANCE: Social History   Tobacco Use  . Smoking status: Former Smoker    Packs/day: 0.50    Years: 10.00    Pack years: 5.00    Types: Cigarettes    Quit date: 1970    Years since quitting: 51.2  . Smokeless tobacco: Never Used  Substance Use Topics  . Alcohol use: Yes    Comment: occasional wine  . Drug use: No  Colonoscopy: Reports history of colonoscopy about 5 years ago.  States that he had a polyp which was benign.  He is uncertain of the exact date.  Bone density: never done   No Known Allergies  Current Outpatient Medications  Medication Sig Dispense Refill  . acetaminophen (TYLENOL) 325 MG tablet Take 650 mg by mouth every 6 (six) hours as needed for mild pain or moderate pain.    . dorzolamide (TRUSOPT) 2 % ophthalmic solution Place 1 drop into both eyes 2 (two) times daily.    . Latanoprostene Bunod (VYZULTA) 0.024 % SOLN Apply 1 drop to eye daily.    . metoprolol tartrate (LOPRESSOR) 25 MG tablet Take 0.5 tablets (12.5 mg total) by mouth 2 (two) times daily. 30 tablet 0  . polyethylene glycol (MIRALAX / GLYCOLAX) 17 g packet Take 17 g by mouth daily. 14 each 0  . Rivaroxaban 15 & 20 MG TBPK Take one 15 mg tablets ONCE PER DAY. Once you have completed your 15 mg tablets, please take a HALF TABLET of your 20 mg tablet (10 mg daily)    . tamsulosin (FLOMAX) 0.4 MG CAPS capsule Take 0.4 mg by mouth at bedtime.    . timolol (TIMOPTIC) 0.5 % ophthalmic solution Place 1 drop into both  eyes 2 (two) times daily.     No current facility-administered medications for this visit.    OBJECTIVE: Middle-aged African-American [place]   There were no vitals filed for this visit.   There is no height or weight on file to calculate BMI.   Wt Readings from Last 3 Encounters:  09/22/19 157 lb 6.4 oz (71.4 kg)  09/15/19 170 lb 6.7 oz (77.3 kg)  09/08/19 172 lb 3.2 oz (78.1 kg)      ECOG FS  Televisit  LAB RESULTS:  CMP     Component Value Date/Time   NA 136 09/22/2019 0915   K 3.8 09/22/2019 0915   CL 106 09/22/2019 0915   CO2 23 09/22/2019 0915   GLUCOSE 93 09/22/2019 0915   BUN 12 09/22/2019 0915   CREATININE 0.76 09/22/2019 0915   CALCIUM 10.3 09/22/2019 0915   PROT 7.2 09/22/2019 0915   ALBUMIN 2.8 (L) 09/22/2019 0915   AST 38 09/22/2019 0915   ALT 55 (H) 09/22/2019 0915   ALKPHOS 156 (H) 09/22/2019 0915   BILITOT 0.6 09/22/2019 0915   GFRNONAA >60 09/22/2019 0915   GFRAA >60 09/22/2019 0915    No results found for: TOTALPROTELP, ALBUMINELP, A1GS, A2GS, BETS, BETA2SER, GAMS, MSPIKE, SPEI  Lab Results  Component Value Date   WBC 6.4 09/22/2019   NEUTROABS 4.5 09/22/2019   HGB 9.9 (L) 09/22/2019   HCT 30.9 (L) 09/22/2019   MCV 92.5 09/22/2019   PLT 458 (H) 09/22/2019    No results found for: LABCA2  No components found for: [id number]  No results for input(s): INR in the last 168 hours.  No results found for: LABCA2  No results found for: JYN829  No results found for: FAO130  No results found for: QMV784  No results found for: CA2729  No components found for: HGQUANT  No results found for: CEA1 / No results found for: CEA1   No results found for: AFPTUMOR  No results found for: CHROMOGRNA  No results found for: KPAFRELGTCHN, LAMBDASER, KAPLAMBRATIO (kappa/lambda light chains)  No results found for: HGBA, HGBA2QUANT, HGBFQUANT, HGBSQUAN (Hemoglobinopathy evaluation)   No results found for: LDH  No results found for: IRON, TIBC,  IRONPCTSAT (Iron and  TIBC)  No results found for: FERRITIN  Urinalysis No results found for: COLORURINE, APPEARANCEUR, LABSPEC, PHURINE, GLUCOSEU, HGBUR, BILIRUBINUR, KETONESUR, PROTEINUR, UROBILINOGEN, NITRITE, LEUKOCYTESUR   STUDIES: DG CHEST PORT 1 VIEW  Result Date: 09/13/2019 CLINICAL DATA:  Respiratory failure EXAM: PORTABLE CHEST 1 VIEW COMPARISON:  September 11, 2019 FINDINGS: The heart size and mediastinal contours are within normal limits. Minimal subsegmental atelectasis seen at both lung bases. The visualized skeletal structures are unremarkable. IMPRESSION: Minimal bibasilar subsegmental atelectasis. Electronically Signed   By: Jonna Clark M.D.   On: 09/13/2019 05:35     ELIGIBLE FOR AVAILABLE RESEARCH PROTOCOL: no  ASSESSMENT: Joe [place] with unprovoked DVT and PE, on Rivaroxaban.  1. Unprovoked DVT and PE on 08/30/2019 (a) s/p catheter directed TPA 2/17 and removable IVC filter placement 08/31/2019 (b) Rivaroxaban BID at 15 mg (c) hypercoagulable panel negative  2. Hemoptysis 09/10/2019 and subsequent, resolved (a) Echo 09/10/2020, EF 50-55%  (b) Bronchoscopy on 09/12/2019 shows no active bleeding or etiology (c) EGD 09/15/2019 showed normal esophagus stomach and duodenum             (d) Rivaroxaban at 15mg  daily, increase to 20 mg daily beginning 10/13/2019   3. Positive blood and urine culture: E Coli on 2/28-pan sensitive: Treated, resolved  4. Deconditioning (a) status post physical therapy, with significant improvement  5. anemia: Moderate   PLAN: Joe [place] has had no further hemoptysis episodes and has tolerated his 15 mg of rivaroxaban quite well.  Accordingly at this point we are going to 20 mg daily which is the standard dose at this point.  He already has appointments with Dr. Willette Brace next week and with Dr. Isaiah Serge in 3 weeks.  He is going to return to  see me 2 weeks after that.  We will do lab work and consider removing the IVC filter assuming he continues to do well with the rivaroxaban at that time.  I commended him on getting his vaccines and on continuing to exercise  He knows to call for any other issue that may develop before the next visit.   Joe Halim. Garrison Michie, MD 10/12/2019 11:01 AM Medical Oncology and Hematology [facility] [address] [place], [place] Tel. [phone]    Fax. [phone]   This document serves as a record of services personally performed by [phone], MD. It was created on his behalf by Ruthann Cancer, a trained medical scribe. The creation of this record is based on the scribe's personal observations and the provider's statements to them.   I, Mickie Bail MD, have reviewed the above documentation for accuracy and completeness, and I agree with the above.   *Total Encounter Time as defined by the Centers for Medicare and Medicaid Services includes, in addition to the face-to-face time of a patient visit (documented in the note above) non-face-to-face time: obtaining and reviewing outside history, ordering and reviewing medications, tests or procedures, care coordination (communications with other health care professionals or caregivers) and documentation in the medical record.

## 2019-10-12 ENCOUNTER — Inpatient Hospital Stay: Payer: BC Managed Care – PPO | Attending: Oncology | Admitting: Oncology

## 2019-10-12 DIAGNOSIS — I2699 Other pulmonary embolism without acute cor pulmonale: Secondary | ICD-10-CM | POA: Diagnosis not present

## 2019-10-12 DIAGNOSIS — R042 Hemoptysis: Secondary | ICD-10-CM

## 2019-10-13 ENCOUNTER — Telehealth: Payer: Self-pay | Admitting: Oncology

## 2019-10-13 NOTE — Telephone Encounter (Signed)
No 4/1 los. No changes made to pt's schedule.  

## 2019-10-17 ENCOUNTER — Other Ambulatory Visit: Payer: Self-pay

## 2019-10-17 MED ORDER — METOPROLOL TARTRATE 25 MG PO TABS: 12.5000 mg | ORAL_TABLET | Freq: Two times a day (BID) | ORAL | 0 refills | Status: DC

## 2019-10-19 ENCOUNTER — Telehealth (HOSPITAL_COMMUNITY): Payer: Self-pay

## 2019-10-19 ENCOUNTER — Encounter: Payer: Self-pay | Admitting: Pulmonary Disease

## 2019-10-19 ENCOUNTER — Ambulatory Visit: Payer: BC Managed Care – PPO | Admitting: Pulmonary Disease

## 2019-10-19 ENCOUNTER — Other Ambulatory Visit: Payer: Self-pay

## 2019-10-19 VITALS — BP 108/64 | HR 78 | Temp 98.0°F | Ht 75.0 in | Wt 157.6 lb

## 2019-10-19 DIAGNOSIS — I2609 Other pulmonary embolism with acute cor pulmonale: Secondary | ICD-10-CM

## 2019-10-19 NOTE — Patient Instructions (Signed)
I am glad you are doing well with your breathing with no recurrence of bleed I will see you back in clinic in 2 months time.

## 2019-10-19 NOTE — Progress Notes (Signed)
Joe [place]    [id number]    April 18, 1951  Primary Care Physician:Hill, Earvin Hansen, MD  Referring Physician: [name], MD [address] [place],  [place] [address]  Chief complaint: Follow-up for submassive PE, hemoptysis, factor V Leyden heterozygous  HPI: 69 year old former smoker.  Quit in 1970.  History of PVCs, aortic root dilatation, pneumonia Admitted on 08/30/19 with submassive PE, cor pulmonale, syncope.  Underwent catheter directed lytics, IVC filter placement on 2/18 He later developed hemoptysis and underwent bronchoscopy (09/12/2019) and EGD with no evidence of active bleed.  Xarelto was briefly held and started at a lower dose.  During that admission he also had E. coli bacteremia and was treated with antibiotics.  Post discharge he continues to feel well.  He has not had a recurrence of hemoptysis.  He has followed up with oncology who has increased his Xarelto to full dose anticoagulation Still has some dyspnea on exertion.  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 acetaminophen (TYLENOL) 325 MG tablet Take 650 mg by mouth every 6 (six) hours as needed for mild pain or moderate pain.  . dorzolamide (TRUSOPT) 2 % ophthalmic solution Place 1 drop into both eyes 2 (two) times daily.  . Latanoprostene Bunod (VYZULTA) 0.024 % SOLN Apply 1 drop to eye daily.  . metoprolol tartrate (LOPRESSOR) 25 MG tablet Take 0.5 tablets (12.5 mg total) by mouth 2 (two) times daily.  . polyethylene glycol (MIRALAX / GLYCOLAX) 17 g packet Take 17 g by mouth daily.  . Rivaroxaban 15 & 20 MG TBPK Take one 15 mg tablets ONCE PER DAY. Once you have completed your 15 mg tablets, please take a HALF TABLET of your 20 mg tablet (10 mg daily)  . tamsulosin (FLOMAX) 0.4 MG CAPS capsule Take 0.4 mg by mouth at bedtime.  . timolol (TIMOPTIC) 0.5 % ophthalmic solution Place 1 drop into both eyes 2 (two)  times daily.   No facility-administered encounter medications on file as of 10/19/2019.   Physical Exam: Blood pressure 108/64, pulse 78, temperature 98 F (36.7 C), temperature source Temporal, height 6\' 3"  (1.905 m), weight 157 lb 9.6 oz (71.5 kg), SpO2 99 %. Gen:      No acute distress HEENT:  EOMI, sclera anicteric Neck:     No masses; no thyromegaly Lungs:    Clear to auscultation bilaterally; normal respiratory effort CV:         Regular rate and rhythm; no murmurs Abd:      + bowel sounds; soft, non-tender; no palpable masses, no distension Ext:    No edema; adequate peripheral perfusion Skin:      Warm and dry; no rash Neuro: alert and oriented x 3 Psych: normal mood and affect  Data Reviewed: Imaging: CTA 09/11/2019-PEs which are slightly decreased in size.  Mild bibasal atelectasis/consolidation.  Cardiac: Echocardiogram 09/11/2019-EF 50-55%, grade 1 diastolic dysfunction edema overload, moderate pulmonary hypertension, RVSP 56.4. (RVSP was 76 on 2/17)  Assessment:  Submassive PE Factor V Leyden heterozygote S/p catheter directed lytics, IVC filter placement He had a brief episode of hemoptysis last month but now is back on full dose Xarelto with no more recurrence of bleed.  He will likely need indefinite anticoagulation Still has a IVC filter in place.  We will reevaluate him in 1-2 months time and if no more episodes  of bleeding then refer back to IR for IVC filter removal  Hemoptysis Stable with no recurrence  Cor pulmonale, RV dysfunction Follows with Dr. Einar Gip.  He will likely need repeat echo down the line.  Plan/Recommendations: Continue anticoagulation Follow-up in 2 months.  Marshell Garfinkel MD [facility] 10/19/2019, 10:51 AM  CC: Iona Beard, MD

## 2019-10-19 NOTE — Telephone Encounter (Signed)
Closed pt Pulmonary Rehab referral per copy/paste note below from EP [place]:  "Pt declined pulm rehab. Pt states he recently completed therapy and is exercising on his own. Will close referral."

## 2019-10-20 ENCOUNTER — Telehealth: Payer: Self-pay | Admitting: Pulmonary Disease

## 2019-10-20 NOTE — Telephone Encounter (Signed)
Called and spoke with pt. Pt stated during his visit with Dr. Isaiah Serge 4/8, it was mentioned to him about being referred to therapy. Pt stated at first he thought it wasn't necessary but then decided that he would take any help he could get.  Dr. Isaiah Serge, please advise if you had mentioned pt beginning pulmonary rehab.

## 2019-10-25 LAB — ACID FAST CULTURE WITH REFLEXED SENSITIVITIES (MYCOBACTERIA): Acid Fast Culture: NEGATIVE

## 2019-10-27 NOTE — Telephone Encounter (Signed)
Spoke with patient. He is aware of Dr. Shirlee More recommendations.   Nothing further needed at time of call.

## 2019-10-27 NOTE — Telephone Encounter (Signed)
Hold off on pulmonary rehab till he gets to see Dr. Jacinto Halim and reassess echo.

## 2019-11-01 NOTE — Progress Notes (Signed)
Primary Physician/Referring:  [name], MD  Patient ID: Joe Cantu, male    DOB: [birth year], 69 y.o.   MRN: [id number]  Chief Complaint  Patient presents with  . PVC  . Shortness of Breath  . Follow-up    6 month   HPI:    Joe Cantu  is a 69 y.o.  Black or African American [2]   male patient with remote history of tobacco use, abnormal EKG frequent PVCs, aortic root dilatation, small abdominal aortic aneurysm, mild hyperlipidemia presents for 6 month follow up. There is no family history of sudden cardiac death.   He was admitted to [facility] on 08/30/19 with submassive PE, acute cor pulmonale, syncope. Underwent catheter directed lytics, IVC filter placement on 08/31/2019.  He later developed hemoptysis and underwent bronchoscopy on 09/12/2019 and EGD with no evidence of an active bleed.   He is here on a 53-month office visit follow-up.  C/O mild  Dyspnea since PE but improving gradually. Denies palpitations or chest pain and is tolerating metoprolol without any side effects.  Past Medical History:  Diagnosis Date  . Aortic root dilatation (HCC) 02/21/2019  . Dyspnea on exertion   . Frequent unifocal PVCs 02/21/2019  . Glaucoma   . Gonorrhea    18 & 19 yrs of age; negative for syphillis, herpes and HIV  . Left inguinal hernia   . Pneumonia   . Ventricular premature beats   . Wears dentures    Past Surgical History:  Procedure Laterality Date  . BRONCHIAL WASHINGS  09/12/2019   Procedure: BRONCHIAL WASHINGS;  Surgeon: Chilton Greathouse, MD;  Location: [facility];  Service: Cardiopulmonary;;  . COLONOSCOPY  07/2018   Benign polyps   . ESOPHAGOGASTRODUODENOSCOPY (EGD) WITH PROPOFOL N/A 09/15/2019   Procedure: ESOPHAGOGASTRODUODENOSCOPY (EGD) WITH PROPOFOL;  Surgeon: Jeani Hawking, MD;  Location: [facility];  Service: Endoscopy;  Laterality: N/A;  . HIATAL HERNIA REPAIR    . IR ANGIOGRAM PULMONARY BILATERAL SELECTIVE  08/30/2019  . IR INFUSION THROMBOL ARTERIAL  INITIAL (MS)  08/30/2019  . IR INFUSION THROMBOL ARTERIAL INITIAL (MS)  08/30/2019  . IR IVC FILTER PLMT / S&I /IMG GUID/MOD SED  08/31/2019  . IR THROMB F/U EVAL ART/VEN FINAL DAY (MS)  08/31/2019  . IR US GUIDE VASC ACCESS LEFT  08/30/2019  . IR US GUIDE VASC ACCESS LEFT  08/30/2019  . VIDEO BRONCHOSCOPY N/A 09/12/2019   Procedure: VIDEO BRONCHOSCOPY WITHOUT FLUORO;  Surgeon: Chilton Greathouse, MD;  Location: [facility];  Service: Cardiopulmonary;  Laterality: N/A;   Social History   Tobacco Use  . Smoking status: Former Smoker    Packs/day: 0.50    Years: 10.00    Pack years: 5.00    Types: Cigarettes    Quit date: 1970    Years since quitting: 51.3  . Smokeless tobacco: Never Used  Substance Use Topics  . Alcohol use: Yes    Comment: occasional wine   Marital Status: Single  ROS  Review of Systems  Cardiovascular: Positive for dyspnea on exertion and leg swelling (left leg). Negative for chest pain.  Gastrointestinal: Negative for melena.   Objective  Blood pressure 124/80, pulse 65, temperature (!) 95.4 F (35.2 C), temperature source Temporal, resp. rate 16, height 6\' 3"  (1.905 m), weight 158 lb (71.7 kg), SpO2 97 %.  Vitals with BMI 11/02/2019 10/19/2019 09/22/2019  Height 6\' 3"  6\' 3"  6\' 3"   Weight 158 lbs 157 lbs 10 oz 157 lbs 6 oz  BMI 19.75 19.7 19.67  Systolic 124 108 786  Diastolic 80 64 84  Pulse 65 78 79     Physical Exam  Cardiovascular: Normal rate, regular rhythm, normal heart sounds and intact distal pulses. Frequent extrasystoles are present. Exam reveals no gallop.  No murmur heard. 2+ left leg below knee leg edema. Non tender. No JVD.  Pulmonary/Chest: Effort normal and breath sounds normal.  Abdominal: Soft. Bowel sounds are normal.   Laboratory examination:   Recent Labs    09/13/19 0255 09/14/19 0514 09/22/19 0915  NA 135 134* 136  K 3.4* 3.9 3.8  CL 107 106 106  CO2 19* 19* 23  GLUCOSE 124* 101* 93  BUN 14 12 12   CREATININE 0.76 0.72 0.76    CALCIUM 8.8* 9.4 10.3  GFRNONAA >60 >60 >60  GFRAA >60 >60 >60   CrCl cannot be calculated (Patient's most recent lab result is older than the maximum 21 days allowed.).  CMP Latest Ref Rng & Units 09/22/2019 09/14/2019 09/13/2019  Glucose 70 - 99 mg/dL 93 11/13/2019) 767(M)  BUN 8 - 23 mg/dL 12 12 14   Creatinine 0.61 - 1.24 mg/dL 094(B 0.96  Sodium 135 - 145 mmol/L 136 134(L) 135  Potassium 3.5 - 5.1 mmol/L 3.8 3.9 3.4(L)  Chloride 98 - 111 mmol/L 106 106 107  CO2 22 - 32 mmol/L 23 19(L) 19(L)  Calcium 8.9 - 10.3 mg/dL 2.83 9.4 6.62)  Total Protein 6.5 - 8.1 g/dL 7.2 - -  Total Bilirubin 0.3 - 1.2 mg/dL 0.6 - -  Alkaline Phos 38 - 126 U/L 156(H) - -  AST 15 - 41 U/L 38 - -  ALT 0 - 44 U/L 55(H) - -   CBC Latest Ref Rng & Units 09/22/2019 09/17/2019 09/16/2019  WBC 4.0 - 10.5 K/uL 6.4 7.3 7.4  Hemoglobin 13.0 - 17.0 g/dL 11/17/2019) 11.2(L) 10.1(L)  Hematocrit 39.0 - 52.0 % 30.9(L) 34.9(L) 31.6(L)  Platelets 150 - 400 K/uL 458(H) 514(H) 453(H)   Lipid Panel  No results found for: CHOL, TRIG, HDL, CHOLHDL, VLDL, LDLCALC, LDLDIRECT HEMOGLOBIN A1C No results found for: HGBA1C, MPG TSH No results for input(s): TSH in the last 8760 hours.  External labs :   08/01/2018: TSH 1.11  04/19/2018: Total cholesterol 93, triglycerides 70, HDL 57, LDL 110.  Non-HDL cholesterol 126. A1c 5.4%, PSA normal.  Medications and allergies  No Known Allergies   Current Outpatient Medications  Medication Instructions  . dorzolamide (TRUSOPT) 2 % ophthalmic solution 1 drop, Both Eyes, 2 times daily  . Latanoprostene Bunod (VYZULTA) 0.024 % SOLN 1 drop, Ophthalmic, Daily  . metoprolol tartrate (LOPRESSOR) 12.5 mg, Oral, 2 times daily  . polyethylene glycol (MIRALAX / GLYCOLAX) 17 g, Oral, Daily  . Rivaroxaban 15 & 20 MG TBPK Take one 15 mg tablets ONCE PER DAY. Once you have completed your 15 mg tablets, please take a HALF TABLET of your 20 mg tablet (10 mg daily)  . tamsulosin (FLOMAX) 0.4 mg, Oral, Daily  at bedtime  . timolol (TIMOPTIC) 0.5 % ophthalmic solution 1 drop, Both Eyes, 2 times daily    Radiology:   Pulmonary Arteriography/Bilateral Pulm arterial lytic infusion cath 08/30/2019: IMPRESSION: 1. Successful fluoroscopic guided initiation of bilateral ultrasound assisted catheter directed pulmonary arterial lysis for sub massive pulmonary embolism and right-sided heart strain. 2. Elevated pressure measurements within the main pulmonary artery compatible with critical pulmonary arterial hypertension. 3. Near occlusive DVT noted within the right common femoral vein.  Vasc 06/19/2018 lower  Extremity Venous 08/30/2019: Summary:  RIGHT: - Findings consistent with acute deep vein thrombosis involving the SF  junction, and right common femoral vein.  - Findings consistent with acute superficial vein thrombosis involving the  right great saphenous vein.  LEFT: - Findings consistent with acute deep vein thrombosis involving the left  common femoral vein, left femoral vein, and left popliteal vein.   Percutaneous IVC Filter Placement 08/31/2019: FINDINGS: IVC venography demonstrates a normal caliber IVC with no evidence of thrombus. Renal veins are identified bilaterally. The IVC filter was successfully positioned below the level of the renal veins and is appropriately oriented. This IVC filter has both permanent and retrievable indications. IMPRESSION: 1. Decrease in measured pulmonary artery pressure after 12 hours of bilateral pulmonary arterial thrombolytic therapy. 2. Placement of percutaneous IVC filter in infrarenal IVC. IVC venogram shows no evidence of IVC thrombus and normal caliber of the inferior vena cava. This filter does have both permanent and retrievable indications.  Cardiac Studies:   Echocardiogram 08/09/2018: Left ventricle cavity is normal in size. Mild concentric hypertrophy of the left ventricle. Normal global wall motion. Normal diastolic filling pattern.  Calculated EF 56%. The aortic root is mildly dilated at 4.0 cm. Trileaflet aortic valve with mild (Grade I) regurgitation. Mild (Grade I) mitral regurgitation. Mild tricuspid regurgitation. No evidence of pulmonary hypertension.  Lexiscan Sestamibi stress test 08/08/2018: 1. Lexiscan stress test was performed. Exercise capacity was not assessed. No stress symptoms reported. Resting blood pressure was 142/82 mmHg and peak effect blood pressure was 108/56 mmHg. The resting and stress electrocardiogram demonstrated normal sinus rhythm, normal resting conduction, frequent PVC's and normal rest repolarization. Stress EKG is non diagnostic for ischemia as it is a pharmacologic stress. 2. Left ventricular cavity is noted to be enlarged on the rest and stress studies. Gated SPECT images mild decrease in myocardial thickening and wall motion. The left ventricular ejection fraction was calculated or visually estimated to be 46%. Small sized, mild intensity, fixed inferior perfusion defect likely related to tissue attenuation artifact. Findings likely represent nonischemic cardiomyopathy. 3. High risk study due to reduced LVEF and frequent PVC's.  Holter Monitor for 24 hours  09/08/2018:  Minimum heart rate 47, maximum heart rate 118 BPM, normal sinus.  PVC but this 18.3%.  20,600 beats PVCs noted 8 wearing couplets 48 wearing triplets and 435 wearing bigeminy, 135 in trigeminy.  Longest run 4 beats.  There were 102 PACs noted.  Abdominal Aortic Duplex 03/30/2019:  Moderate dilatation of the abdominal aorta is noted in the proximal aorta. An abdominal aortic aneurysm measuring 3.63 x 3.63 x 3.63 cm is seen.   Moderate heterogeneous plaque seen throughout the abdominal aorta.  Normal iliac artery velocity. Recheck in 1 year for stability in size.   Echocardiogram [place] 09/11/2019: 1. Left ventricular ejection fraction, by estimation, is 50 to 55%. The  left ventricle has low normal function. The  left ventricle has no regional  wall motion abnormalities. Left ventricular diastolic parameters are  consistent with Grade I diastolic  dysfunction (impaired relaxation). There is the interventricular septum is  flattened in diastole ('D' shaped left ventricle), consistent with right  ventricular volume overload.  2. Right ventricular systolic function is normal. The right ventricular size is moderately enlarged. There is moderately elevated pulmonary artery  systolic pressure. The estimated right ventricular systolic pressure is  95.2 mmHg.  3. Right atrial size was moderately dilated.  4. The mitral valve is normal in structure and function. Trivial mitral  valve regurgitation. No evidence of mitral stenosis.  5. The aortic valve is normal in structure and function. Aortic valve  regurgitation is not visualized. No aortic stenosis is present.  6. Aortic dilatation noted. There is mild dilatation of the ascending  aorta measuring 37 mm.   EKG:  EKG 11/02/2019: Normal sinus rhythm with rate of 72 bpm, borderline criteria for left atrial enlargement.  Left axis deviation, left anterior fascicular block.  Poor R progression, cannot exclude anteroseptal infarct old.  Nonspecific T abnormality.   Compared to 02/21/2019, frequent PVCs no longer present.  Assessment     ICD-10-CM   1. Aortic root dilatation (HCC)  I77.810 PCV ECHOCARDIOGRAM COMPLETE  2. History of pulmonary embolism  Z86.711 EKG 12-Lead   08/30/2019  3. PVC (premature ventricular contraction)  I49.3   4. 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 Mild hyperlipidemia  E78.5    No orders of the defined types were placed in this encounter.   Medications Discontinued During This Encounter  Medication Reason  . acetaminophen (TYLENOL) 325 MG tablet No longer needed (for PRN medications)     Recommendations:   Joe Cantu  is a 69 y.o. Black or African American  [2]   male patient with remote history of tobacco use, abnormal EKG frequent PVCs, aortic root dilatation, small abdominal aortic aneurysm, mild hyperlipidemia presents for 6 month follow up. There is no family history of sudden cardiac death.   He was admitted to [facility] on 08/30/19 with bilateral lower extremity DVT and submassive PE, acute cor pulmonale, syncope. Underwent catheter directed lytics, IVC filter placement on 08/31/2019. He is here on a 60-month office visit follow-up.  C/O mild  Dyspnea since PE but improving gradually.   He was evaluated by Dr. Marikay Alar Magrinot, hematology and hypercoagulable work-up was negative.  In view of unprovoked DVT and massive PE, would probably continue anticoagulation for at least 6 months to a year in view of chronic cor pulmonale.  I will repeat echocardiogram in 3 months and I would like to see him back then.  As he is tolerating Xarelto without further hemoptysis or bleeding diathesis, I would support taking the IVC filter out, if treatment team agrees, I can set this up.  With regard to PVCs, he is asymptomatic and his LVEF is normal, continue low-dose beta-blocker.  He does have aortic atherosclerosis and small abdominal aortic aneurysm, and aortic root dilatation, has mildly abnormal lipids, he would benefit from being on high intensity low-dose statins, Lipitor 10 mg.  I would consider this on his next office visit as he is recently has had major health issues.  He also states that he has had mild retinal detachment involving his right eye and also has glaucoma and he has seen Dr. Allyne Gee (retina specialist) who plans to do laser surgery.  Slightly complex issue in view of patient being on anticoagulation but I can certainly try to coordinate bridging prior to the procedure if necessary and I have given him my cell phone number to pass on to Dr. Allyne Gee.  60 min encounter for evaluation of all records and coordination of care.  Yates Decamp, MD,  [facility] 11/02/2019, 7:06 PM [facility] [place] Office: [phone]   CC: Drs. Praveen Mannam, Gus Magrinat, Nicholos Johns

## 2019-11-02 ENCOUNTER — Encounter: Payer: Self-pay | Admitting: Cardiology

## 2019-11-02 ENCOUNTER — Ambulatory Visit: Payer: BC Managed Care – PPO | Admitting: Cardiology

## 2019-11-02 ENCOUNTER — Other Ambulatory Visit: Payer: Self-pay

## 2019-11-02 VITALS — BP 124/80 | HR 65 | Temp 95.4°F | Resp 16 | Ht 75.0 in | Wt 158.0 lb

## 2019-11-02 DIAGNOSIS — I2602 Saddle embolus of pulmonary artery with acute cor pulmonale: Secondary | ICD-10-CM

## 2019-11-02 DIAGNOSIS — Z86711 Personal history of pulmonary embolism: Secondary | ICD-10-CM

## 2019-11-02 DIAGNOSIS — I493 Ventricular premature depolarization: Secondary | ICD-10-CM

## 2019-11-02 DIAGNOSIS — E785 Hyperlipidemia, unspecified: Secondary | ICD-10-CM

## 2019-11-02 DIAGNOSIS — I77810 Thoracic aortic ectasia: Secondary | ICD-10-CM

## 2019-11-02 DIAGNOSIS — I2782 Chronic pulmonary embolism: Secondary | ICD-10-CM

## 2019-11-11 ENCOUNTER — Other Ambulatory Visit: Payer: Self-pay | Admitting: Cardiology

## 2019-11-11 ENCOUNTER — Other Ambulatory Visit: Payer: Self-pay | Admitting: Internal Medicine

## 2019-11-13 ENCOUNTER — Ambulatory Visit (HOSPITAL_COMMUNITY)
Admission: RE | Admit: 2019-11-13 | Discharge: 2019-11-13 | Disposition: A | Discharge status: Home / Self Care | Payer: BC Managed Care – PPO | Source: Ambulatory Visit | Attending: Oncology | Admitting: Oncology

## 2019-11-13 ENCOUNTER — Other Ambulatory Visit: Payer: Self-pay

## 2019-11-13 DIAGNOSIS — I82401 Acute embolism and thrombosis of unspecified deep veins of right lower extremity: Secondary | ICD-10-CM | POA: Diagnosis present

## 2019-11-13 DIAGNOSIS — I2699 Other pulmonary embolism without acute cor pulmonale: Secondary | ICD-10-CM

## 2019-11-13 DIAGNOSIS — R042 Hemoptysis: Secondary | ICD-10-CM | POA: Diagnosis present

## 2019-11-13 NOTE — Progress Notes (Signed)
Bilateral lower extremity venous duplex completed. Refer to "CV Proc" under chart review to view preliminary results.  11/13/2019 10:18 AM Eula Fried., MHA, RVT, RDCS, RDMS

## 2019-11-15 NOTE — Progress Notes (Signed)
[facility]  Telephone:([phone]) [phone] Fax:(336) [phone]     ID: Joe Cantu DOB: Mar 02, 1951  MR#: [id number]  JYN#:[id number]  Patient Care Team: Joe Mires, MD as PCP - General (Family Medicine) Joe Cantu, Joe Hue, MD as Consulting Physician (Oncology) Joe Cantu OTHER MD:   CHIEF COMPLAINT: massive PE and left lower extremity DVT complicated by hemoptysis while on Xarelto  CURRENT TREATMENT: Rivaroxaban; reversible filter in place   INTERVAL HISTORY: Joe Cantu returns today for follow up of his pulmonary embolism and left leg DVT.  To recap: He had a massive PE, was initially anticoagulated with heparin then switched to rivaroxaban and then developed hemoptysis.  He stabilized and was discharged on 15 mg daily instead of the standard 15 mg twice daily.  He does have a reversible filter in place.  Since his last visit, he underwent lower extremity venous ultrasound on 11/13/2019, which again showed left leg DVT with partial resolution when compared to prior study in 08/2019.  The right-sided clot appeared chronic as well  REVIEW OF SYSTEMS: Joe Cantu is doing much better.  He mowed most of his yard this morning before coming here.  He does not have the appetite he used to have he says.  He is somewhat constipated which bothersome.  He takes MiraLAX irregularly.  He has had no further issues with hemoptysis and has had no bright red blood per rectum, melena, bruising or any evidence of bleeding.  Sometimes once he takes a deep breath he feels a little discomfort in his right chest but this is intermittent.  He does have a little bit of a dry cough at times.  A detailed review of systems today was otherwise stable   HISTORY OF CURRENT ILLNESS: From the original intake note:   Joe Cantu is a 69 year-old [place] man with a past medical history significant for PVCs, aortic root dilatation, recent diagnosis of acute massive PE with significant right heart  dysfunction and left lower extremity DVT in February 2021.    He was hospitalized 08/30/2019 through 09/02/2019 for the PE and DVT.  With extensive questioning this appears to have been an unprovoked PE.  The right pulmonary artery was completely opacified, with no flow to the right lung, with significant clot in the left side as well.  He received catheter directed TPA and was placed on a heparin drip with transition to Xarelto.  He had a retrievable IVC filter placed on 08/31/2019 because there was residual clot in the legs  The patient did well at home after discharge although he was still "taking it slow".  On  09/09/2019 he developed a persistent cough and eventually hemoptysis.  He could not quantify how much blood he coughed up however per EMS report there was approximately 300 to 600 cc of blood in a juice jug.    In the ER, his alkaline phosphatase was 211, albumin 2.3, AST 134, ALT 110, WBC 14.2, hemoglobin 10.4, INR 2.7.  His Xarelto was placed on hold and pulmonology has been consulted.  They recommended a CT of the chest and possible bronchoscopy which was done today and shows improvement but persistence of the bilateral PEs, with no obvious cause of the bleeding.  Due to concern for possible aspiration during his coughing episodes, he is currently on IV antibiotics.    Note that during his last hospitalization, a hyper coag panel was sent which was unrevealing with the exception of low protein S activity at 49%, likely reactive,  heterozygosity for factor V Leiden.  We are asked to see the patient to make recommendations regarding anticoagulation in this patient with recent diagnosis of massive PE, left lower extremity DVT, and now with hemoptysis while on Xarelto.  The patient's subsequent history is as detailed below.   PAST MEDICAL HISTORY: Past Medical History:  Diagnosis Date  . Aortic root dilatation (HCC) 02/21/2019  . Dyspnea on exertion   . Frequent unifocal PVCs 02/21/2019  .  Glaucoma   . Gonorrhea    18 & 19 yrs of age; negative for syphillis, herpes and HIV  . Left inguinal hernia   . Pneumonia   . Ventricular premature beats   . Wears dentures     PAST SURGICAL HISTORY: Past Surgical History:  Procedure Laterality Date  . BRONCHIAL WASHINGS  09/12/2019   Procedure: BRONCHIAL WASHINGS;  Surgeon: Joe Greathouse, MD;  Location: [facility];  Service: Cardiopulmonary;;  . COLONOSCOPY  07/2018   Benign polyps   . ESOPHAGOGASTRODUODENOSCOPY (EGD) WITH PROPOFOL N/A 09/15/2019   Procedure: ESOPHAGOGASTRODUODENOSCOPY (EGD) WITH PROPOFOL;  Surgeon: Joe Hawking, MD;  Location: [facility];  Service: Endoscopy;  Laterality: N/A;  . HIATAL HERNIA REPAIR    . IR ANGIOGRAM PULMONARY BILATERAL SELECTIVE  08/30/2019  . IR INFUSION THROMBOL ARTERIAL INITIAL (MS)  08/30/2019  . IR INFUSION THROMBOL ARTERIAL INITIAL (MS)  08/30/2019  . IR IVC FILTER PLMT / S&I /IMG GUID/MOD SED  08/31/2019  . IR THROMB F/U EVAL ART/VEN FINAL DAY (MS)  08/31/2019  . IR US GUIDE VASC ACCESS LEFT  08/30/2019  . IR US GUIDE VASC ACCESS LEFT  08/30/2019  . VIDEO BRONCHOSCOPY N/A 09/12/2019   Procedure: VIDEO BRONCHOSCOPY WITHOUT FLUORO;  Surgeon: Joe Greathouse, MD;  Location: [facility];  Service: Cardiopulmonary;  Laterality: N/A;    FAMILY HISTORY: Family History  Problem Relation Age of Onset  . Diabetes Mother   . Cancer Father        colon  . Diabetes Sister   . Diabetes Brother      SOCIAL HISTORY: (updated 09/2019)  Joe Cantu is single.  He reports that he has 6 children who live throughout [place].  He lives with his fiance Joe Cantu, who works as a bus attendant, and shepherds children to school buses.  The patient himself works in Production designer, theatre/television/film at [employer].  Denies history of alcohol use.  He previously smoked 1/2 pack/day of cigarettes for approximately 10 years but quit back in about 1977.    ADVANCED DIRECTIVES: The patient tells me his sister is  Joe Cantu is his healthcare power of attorney.  She can be reached at [phone].   HEALTH MAINTENANCE: Social History   Tobacco Use  . Smoking status: Former Smoker    Packs/day: 0.50    Years: 10.00    Pack years: 5.00    Types: Cigarettes    Quit date: 1970    Years since quitting: 51.3  . Smokeless tobacco: Never Used  Substance Use Topics  . Alcohol use: Yes    Comment: occasional wine  . Drug use: No     Colonoscopy: Reports history of colonoscopy about 5 years ago.  States that he had a polyp which was benign.  He is uncertain of the exact date.  Bone density: never done   No Known Allergies  Current Outpatient Medications  Medication Sig Dispense Refill  . dorzolamide (TRUSOPT) 2 % ophthalmic solution Place 1 drop into both eyes 2 (two) times daily.    Marland Kitchen  Latanoprostene Bunod (VYZULTA) 0.024 % SOLN Apply 1 drop to eye daily.    . metoprolol tartrate (LOPRESSOR) 25 MG tablet Take 1/2 (one-half) tablet by mouth twice daily 30 tablet 0  . polyethylene glycol (MIRALAX / GLYCOLAX) 17 g packet Take 17 g by mouth daily. 14 each 0  . Rivaroxaban 15 & 20 MG TBPK Take one 15 mg tablets ONCE PER DAY. Once you have completed your 15 mg tablets, please take a HALF TABLET of your 20 mg tablet (10 mg daily)    . tamsulosin (FLOMAX) 0.4 MG CAPS capsule Take 0.4 mg by mouth at bedtime.    . timolol (TIMOPTIC) 0.5 % ophthalmic solution Place 1 drop into both eyes 2 (two) times daily.     No current facility-administered medications for this visit.    OBJECTIVE:  African-American man in no acute distress  There were no vitals filed for this visit.   There is no height or weight on file to calculate BMI.   Wt Readings from Last 3 Encounters:  11/02/19 158 lb (71.7 kg)  10/19/19 157 lb 9.6 oz (71.5 kg)  09/22/19 157 lb 6.4 oz (71.4 kg)      ECOG FS  Sclerae unicteric, EOMs intact Wearing a mask No cervical or supraclavicular adenopathy Lungs no rales or rhonchi Heart  regular rate with occasional premature beats  Abd soft, nontender, positive bowel sounds MSK no focal spinal tenderness, no upper extremity lymphedema Neuro: nonfocal, well oriented, appropriate affect Breasts: deferred   LAB RESULTS:  CMP     Component Value Date/Time   NA 136 09/22/2019 0915   K 3.8 09/22/2019 0915   CL 106 09/22/2019 0915   CO2 23 09/22/2019 0915   GLUCOSE 93 09/22/2019 0915   BUN 12 09/22/2019 0915   CREATININE 0.76 09/22/2019 0915   CALCIUM 10.3 09/22/2019 0915   PROT 7.2 09/22/2019 0915   ALBUMIN 2.8 (L) 09/22/2019 0915   AST 38 09/22/2019 0915   ALT 55 (H) 09/22/2019 0915   ALKPHOS 156 (H) 09/22/2019 0915   BILITOT 0.6 09/22/2019 0915   GFRNONAA >60 09/22/2019 0915   GFRAA >60 09/22/2019 0915    No results found for: TOTALPROTELP, ALBUMINELP, A1GS, A2GS, BETS, BETA2SER, GAMS, MSPIKE, SPEI  Lab Results  Component Value Date   WBC 6.4 09/22/2019   NEUTROABS 4.5 09/22/2019   HGB 9.9 (L) 09/22/2019   HCT 30.9 (L) 09/22/2019   MCV 92.5 09/22/2019   PLT 458 (H) 09/22/2019    No results found for: LABCA2  No components found for: MGQQPY195  No results for input(s): INR in the last 168 hours.  No results found for: LABCA2  No results found for: KDT267  No results found for: TIW580  No results found for: DXI338  No results found for: CA2729  No components found for: HGQUANT  No results found for: CEA1 / No results found for: CEA1   No results found for: AFPTUMOR  No results found for: CHROMOGRNA  No results found for: KPAFRELGTCHN, LAMBDASER, KAPLAMBRATIO (kappa/lambda light chains)  No results found for: HGBA, HGBA2QUANT, HGBFQUANT, HGBSQUAN (Hemoglobinopathy evaluation)   No results found for: LDH  No results found for: IRON, TIBC, IRONPCTSAT (Iron and TIBC)  No results found for: FERRITIN  Urinalysis No results found for: COLORURINE, APPEARANCEUR, LABSPEC, PHURINE, GLUCOSEU, HGBUR, BILIRUBINUR, KETONESUR, PROTEINUR,  UROBILINOGEN, NITRITE, LEUKOCYTESUR   STUDIES: VAS [place] LOWER EXTREMITY VENOUS (DVT)  Result Date: 11/13/2019  Lower Venous DVTStudy Indications: Follow up DVT.  Comparison Study:  08/30/2019- acute DVT right SFJ, right CFV, left CFV, left                   femoral vein, left popliteal vein Performing Technologist: Gertie FeyMichelle Simonetti MHA, RDMS, RVT, RDCS  Examination Guidelines: A complete evaluation includes B-mode imaging, spectral Doppler, color Doppler, and power Doppler as needed of all accessible portions of each vessel. Bilateral testing is considered an integral part of a complete examination. Limited examinations for reoccurring indications may be performed as noted. The reflux portion of the exam is performed with the patient in reverse Trendelenburg.  +---------+---------------+---------+-----------+----------+--------------+ RIGHT    CompressibilityPhasicitySpontaneityPropertiesThrombus Aging +---------+---------------+---------+-----------+----------+--------------+ CFV      Partial        Yes      Yes                  Chronic        +---------+---------------+---------+-----------+----------+--------------+ SFJ      Partial                                      Chronic        +---------+---------------+---------+-----------+----------+--------------+ FV Prox  Full                                                        +---------+---------------+---------+-----------+----------+--------------+ FV Mid   Full                                                        +---------+---------------+---------+-----------+----------+--------------+ FV DistalFull                                                        +---------+---------------+---------+-----------+----------+--------------+ PFV      Full                                                        +---------+---------------+---------+-----------+----------+--------------+ POP      Full           Yes       Yes                                 +---------+---------------+---------+-----------+----------+--------------+ PTV      Full                                                        +---------+---------------+---------+-----------+----------+--------------+ PERO     Full                                                        +---------+---------------+---------+-----------+----------+--------------+  EIV                              Yes                                 +---------+---------------+---------+-----------+----------+--------------+ CIV                              Yes                                 +---------+---------------+---------+-----------+----------+--------------+   +---------+---------------+---------+-----------+----------+-----------------+ LEFT     CompressibilityPhasicitySpontaneityPropertiesThrombus Aging    +---------+---------------+---------+-----------+----------+-----------------+ CFV      Full           Yes      Yes                                    +---------+---------------+---------+-----------+----------+-----------------+ SFJ      Full                                                           +---------+---------------+---------+-----------+----------+-----------------+ FV Prox  Full                                                           +---------+---------------+---------+-----------+----------+-----------------+ FV Mid   Full                                                           +---------+---------------+---------+-----------+----------+-----------------+ FV DistalPartial                                      Age Indeterminate +---------+---------------+---------+-----------+----------+-----------------+ PFV      Full                                                           +---------+---------------+---------+-----------+----------+-----------------+ POP      Partial        Yes       Yes                  Age Indeterminate +---------+---------------+---------+-----------+----------+-----------------+ PTV      Full                                                           +---------+---------------+---------+-----------+----------+-----------------+  PERO     Full                                                           +---------+---------------+---------+-----------+----------+-----------------+ EIV                              Yes                                    +---------+---------------+---------+-----------+----------+-----------------+ CIV                              Yes                                    +---------+---------------+---------+-----------+----------+-----------------+    Summary: RIGHT: - Findings consistent with chronic deep vein thrombosis involving the right common femoral vein, and SF junction. - No cystic structure found in the popliteal fossa.  LEFT: - Findings consistent with age indeterminate deep vein thrombosis involving the left femoral vein, and left popliteal vein. - No cystic structure found in the popliteal fossa.  When compared to prior study 08/30/2019, there is no evidence of thrombus extension. Partial resolution is noted in left lower extremity.  *See table(s) above for measurements and observations. Electronically signed by Fabienne Bruns MD on 11/13/2019 at 3:44:21 PM.    Final      ELIGIBLE FOR AVAILABLE RESEARCH PROTOCOL: no  ASSESSMENT: 69 y.o. [place] man with unprovoked DVT and PE, on Rivaroxaban.  1. Unprovoked DVT and PE on 08/30/2019 (a) s/p catheter directed TPA 2/17 and removable IVC filter placement 08/31/2019 (b) Rivaroxaban BID at 15 mg (c) hypercoagulable panel negative  (d) repeat CT angio 09/11/2019 shows slightly decreased bilateral large pulmonary emboli  (e) repeat bilateral lower extremity Dopplers 11/13/2019 show bilateral chronic DVTs with partial  resolution on the left  (f) removal of IVC filter  2. Hemoptysis 09/10/2019 and subsequent, resolved (a) Echo 09/10/2020, EF 50-55%  (b) Bronchoscopy on 09/12/2019 shows no active bleeding or etiology (c) EGD 09/15/2019 showed normal esophagus stomach and duodenum             (d) Rivaroxaban at 15mg  daily, increased to 20 mg daily beginning 10/13/2019   3. Positive blood and urine culture: E Coli on 2/28-pan sensitive: Treated, resolved  4. Deconditioning (a) status post physical therapy, with significant improvement  5. anemia: Moderate   PLAN: Joe Cantu is tolerating the rivaroxaban well, full dose, with no bleeding complications.  We again reinforced the importance of him avoiding falls or blows to the head or similar trauma.  The plan is to continue rivaroxaban indefinitely.  He has had a little bit of improvement in his left lower extremity DVT, not so much on the right.  I think it might be prudent to wait a couple more months before removing the IVC filter.  He will have a repeat Doppler test in July and if all goes well we will remove the filter in August.  He will return to see me in September and at that time we will probably  start seeing him every 6 months for 1 year and then yearly.  We discussed his using MiraLAX daily for relief of constipation  Total encounter time 25 minutes.Raymond Gurney C. Naomie Crow, MD 11/15/2019 8:18 PM Medical Oncology and Hematology [facility] [address] [place], [place] [address] Tel. [phone]    Fax. [phone]   This document serves as a record of services personally performed by Ruthann Cancer, MD. It was created on his behalf by Mickie Bail, a trained medical scribe. The creation of this record is based on the scribe's personal observations and the provider's statements to them.   I, Ruthann Cancer MD, have reviewed the above documentation for accuracy and  completeness, and I agree with the above.   *Total Encounter Time as defined by the Centers for Medicare and Medicaid Services includes, in addition to the face-to-face time of a patient visit (documented in the note above) non-face-to-face time: obtaining and reviewing outside history, ordering and reviewing medications, tests or procedures, care coordination (communications with other health care professionals or caregivers) and documentation in the medical record.

## 2019-11-16 ENCOUNTER — Inpatient Hospital Stay: Payer: BC Managed Care – PPO | Attending: Oncology

## 2019-11-16 ENCOUNTER — Other Ambulatory Visit: Payer: Self-pay

## 2019-11-16 ENCOUNTER — Inpatient Hospital Stay (HOSPITAL_BASED_OUTPATIENT_CLINIC_OR_DEPARTMENT_OTHER): Payer: BC Managed Care – PPO | Admitting: Oncology

## 2019-11-16 VITALS — BP 140/71 | HR 87 | Temp 98.0°F | Resp 18 | Ht 75.0 in | Wt 156.8 lb

## 2019-11-16 DIAGNOSIS — I2699 Other pulmonary embolism without acute cor pulmonale: Secondary | ICD-10-CM | POA: Diagnosis present

## 2019-11-16 DIAGNOSIS — D649 Anemia, unspecified: Secondary | ICD-10-CM | POA: Insufficient documentation

## 2019-11-16 DIAGNOSIS — R042 Hemoptysis: Secondary | ICD-10-CM

## 2019-11-16 DIAGNOSIS — Z79899 Other long term (current) drug therapy: Secondary | ICD-10-CM | POA: Insufficient documentation

## 2019-11-16 DIAGNOSIS — I82402 Acute embolism and thrombosis of unspecified deep veins of left lower extremity: Secondary | ICD-10-CM

## 2019-11-16 DIAGNOSIS — I82401 Acute embolism and thrombosis of unspecified deep veins of right lower extremity: Secondary | ICD-10-CM

## 2019-11-16 LAB — CBC WITH DIFFERENTIAL/PLATELET
Abs Immature Granulocytes: 0.03 10*3/uL (ref 0.00–0.07)
Basophils Absolute: 0 10*3/uL (ref 0.0–0.1)
Basophils Relative: 0 %
Eosinophils Absolute: 0 10*3/uL (ref 0.0–0.5)
Eosinophils Relative: 0 %
HCT: 37.8 % — ABNORMAL LOW (ref 39.0–52.0)
Hemoglobin: 12.5 g/dL — ABNORMAL LOW (ref 13.0–17.0)
Immature Granulocytes: 0 %
Lymphocytes Relative: 11 %
Lymphs Abs: 0.9 10*3/uL (ref 0.7–4.0)
MCH: 30.4 pg (ref 26.0–34.0)
MCHC: 33.1 g/dL (ref 30.0–36.0)
MCV: 92 fL (ref 80.0–100.0)
Monocytes Absolute: 0.6 10*3/uL (ref 0.1–1.0)
Monocytes Relative: 8 %
Neutro Abs: 6.2 10*3/uL (ref 1.7–7.7)
Neutrophils Relative %: 81 %
Platelets: 308 10*3/uL (ref 150–400)
RBC: 4.11 MIL/uL — ABNORMAL LOW (ref 4.22–5.81)
RDW: 13.3 % (ref 11.5–15.5)
WBC: 7.7 10*3/uL (ref 4.0–10.5)
nRBC: 0 % (ref 0.0–0.2)

## 2019-11-16 LAB — COMPREHENSIVE METABOLIC PANEL
ALT: 14 U/L (ref 0–44)
AST: 16 U/L (ref 15–41)
Albumin: 3.4 g/dL — ABNORMAL LOW (ref 3.5–5.0)
Alkaline Phosphatase: 67 U/L (ref 38–126)
Anion gap: 9 (ref 5–15)
BUN: 20 mg/dL (ref 8–23)
CO2: 22 mmol/L (ref 22–32)
Calcium: 11.3 mg/dL — ABNORMAL HIGH (ref 8.9–10.3)
Chloride: 110 mmol/L (ref 98–111)
Creatinine, Ser: 0.87 mg/dL (ref 0.61–1.24)
GFR calc Af Amer: >60 mL/min (ref >60)
GFR calc non Af Amer: >60 mL/min (ref >60)
Glucose, Bld: 135 mg/dL — ABNORMAL HIGH (ref 70–99)
Potassium: 3.9 mmol/L (ref 3.5–5.1)
Sodium: 141 mmol/L (ref 135–145)
Total Bilirubin: 0.5 mg/dL (ref 0.3–1.2)
Total Protein: 7.9 g/dL (ref 6.5–8.1)

## 2019-11-16 LAB — RETICULOCYTES
Immature Retic Fract: 9.6 % (ref 2.3–15.9)
RBC.: 4.09 MIL/uL — ABNORMAL LOW (ref 4.22–5.81)
Retic Count, Absolute: 52.8 10*3/uL (ref 19.0–186.0)
Retic Ct Pct: 1.3 % (ref 0.4–3.1)

## 2019-11-16 LAB — D-DIMER, QUANTITATIVE: D-Dimer, Quant: 1.12 ug/mL-FEU — ABNORMAL HIGH (ref 0.00–0.50)

## 2019-11-16 LAB — FERRITIN: Ferritin: 581 ng/mL — ABNORMAL HIGH (ref 24–336)

## 2019-11-16 MED ORDER — RIVAROXABAN (XARELTO) VTE STARTER PACK (15 & 20 MG): ORAL_TABLET | ORAL | 6 refills | Status: DC

## 2019-11-20 ENCOUNTER — Telehealth: Payer: Self-pay | Admitting: Oncology

## 2019-11-20 NOTE — Telephone Encounter (Signed)
Scheduled appts per 5/6 los. Pt confirmed appt date and time.  

## 2019-11-25 NOTE — Progress Notes (Signed)
Primary Physician/Referring:  Iona Beard, MD  Patient ID: Joe Cantu, male    DOB: Jan 27, 1951, 69 y.o.   MRN: [id number]  Chief Complaint  Patient presents with  . Aortic Root dilation   HPI:    Joe Cantu  is a 69 y.o.  African American male patient with remote history of tobacco use, abnormal EKG frequent PVCs, aortic root dilatation, small abdominal aortic aneurysm, mild hyperlipidemia, no family history of sudden cardiac death. He was admitted to [facility] on 08/30/19 with submassive PE, acute cor pulmonale, syncope. Underwent catheter directed lytics, IVC filter placement on 08/31/2019.  He later developed hemoptysis and underwent bronchoscopy on 09/12/2019 and EGD with no evidence of an active bleed. He is now on Eliquis, hypercoagulable work-up negative and recommended continuation of anticoagulants for 6 to 12 months in view of spontaneous DVT-PE.  Is presently having mild exertional dyspnea as he has increased his physical activity but no further leg pain or leg edema, tolerating anticoagulation.  I had seen him 1 month ago and after discussions/communication with hematology, brought him in to discuss retrieval of IVC filter placed during hospitalization as he is now well anticoagulated.  Past Medical History:  Diagnosis Date  . Aortic root dilatation ([place]) 02/21/2019  . Dyspnea on exertion   . Frequent unifocal PVCs 02/21/2019  . Glaucoma   . Gonorrhea    87 & 65 yrs of age; negative for syphillis, herpes and HIV  . Left inguinal hernia   . Pneumonia   . Ventricular premature beats   . Wears dentures    Past Surgical History:  Procedure Laterality Date  . BRONCHIAL WASHINGS  09/12/2019   Procedure: BRONCHIAL WASHINGS;  Surgeon: Marshell Garfinkel, MD;  Location: [facility];  Service: Cardiopulmonary;;  . COLONOSCOPY  07/2018   Benign polyps   . ESOPHAGOGASTRODUODENOSCOPY (EGD) WITH PROPOFOL N/A 09/15/2019   Procedure: ESOPHAGOGASTRODUODENOSCOPY (EGD) WITH  PROPOFOL;  Surgeon: Carol Ada, MD;  Location: [facility];  Service: Endoscopy;  Laterality: N/A;  . HIATAL HERNIA REPAIR    . IR ANGIOGRAM PULMONARY BILATERAL SELECTIVE  08/30/2019  . IR INFUSION THROMBOL ARTERIAL INITIAL (MS)  08/30/2019  . IR INFUSION THROMBOL ARTERIAL INITIAL (MS)  08/30/2019  . IR IVC FILTER PLMT / S&I /IMG GUID/MOD SED  08/31/2019  . IR THROMB F/U EVAL ART/VEN FINAL DAY (MS)  08/31/2019  . IR US GUIDE VASC ACCESS LEFT  08/30/2019  . IR US GUIDE VASC ACCESS LEFT  08/30/2019  . VIDEO BRONCHOSCOPY N/A 09/12/2019   Procedure: VIDEO BRONCHOSCOPY WITHOUT FLUORO;  Surgeon: Marshell Garfinkel, MD;  Location: [facility];  Service: Cardiopulmonary;  Laterality: N/A;   Social History   Tobacco Use  . Smoking status: Former Smoker    Packs/day: 0.50    Years: 10.00    Pack years: 5.00    Types: Cigarettes    Quit date: 1970    Years since quitting: 51.4  . Smokeless tobacco: Never Used  Substance Use Topics  . Alcohol use: Yes    Comment: occasional wine   Marital Status: Single  ROS  Review of Systems  Cardiovascular: Positive for dyspnea on exertion and leg swelling (left leg). Negative for chest pain.  Gastrointestinal: Negative for melena.   Objective  Blood pressure 115/65, pulse 60, temperature 97.7 F (36.5 C), height 6\' 3"  (1.905 m), weight 159 lb 12.8 oz (72.5 kg), SpO2 97 %.  Vitals with BMI 11/27/2019 11/16/2019 11/02/2019  Height 6\' 3"  6\' 3"  6\' 3"   Weight 159 lbs 13 oz 156 lbs 13 oz 158 lbs  BMI 19.97 19.6 19.75  Systolic 115 140 469  Diastolic 65 71 80  Pulse 60 87 65     Physical Exam  Cardiovascular: Normal rate, regular rhythm, normal heart sounds and intact distal pulses. Frequent extrasystoles are present. Exam reveals no gallop.  No murmur heard. 1-2+ left leg below knee leg edema. Non tender. No JVD.  Pulmonary/Chest: Effort normal and breath sounds normal.  Abdominal: Soft. Bowel sounds are normal.   Laboratory examination:   Recent Labs      09/14/19 0514 09/22/19 0915 11/16/19 1330  NA 134* 136 141  K 3.9 3.8 3.9  CL 106 106 110  CO2 19* 23 22  GLUCOSE 101* 93 135*  BUN 12 12 20   CREATININE 0.72 0.76 0.87  CALCIUM 9.4 10.3 11.3*  GFRNONAA >60 >60 >60  GFRAA >60 >60 >60   estimated creatinine clearance is 85.6 mL/min (by C-G formula based on SCr of 0.87 mg/dL).  CMP Latest Ref Rng & Units 11/16/2019 09/22/2019 09/14/2019  Glucose 70 - 99 mg/dL 11/14/2019) 93 629(B)  BUN 8 - 23 mg/dL 20 12 12   Creatinine 0.61 - 1.24 mg/dL 284(X 3.24  Sodium 135 - 145 mmol/L 141 136 134(L)  Potassium 3.5 - 5.1 mmol/L 3.9 3.8 3.9  Chloride 98 - 111 mmol/L 110 106 106  CO2 22 - 32 mmol/L 22 23 19(L)  Calcium 8.9 - 10.3 mg/dL 11.3(H) 10.3 9.4  Total Protein 6.5 - 8.1 g/dL 7.9 7.2 -  Total Bilirubin 0.3 - 1.2 mg/dL 0.5 0.6 -  Alkaline Phos 38 - 126 U/L 67 156(H) -  AST 15 - 41 U/L 16 38 -  ALT 0 - 44 U/L 14 55(H) -   CBC Latest Ref Rng & Units 11/16/2019 09/22/2019 09/17/2019  WBC 4.0 - 10.5 K/uL 7.7 6.4 7.3  Hemoglobin 13.0 - 17.0 g/dL 12.5(L) 9.9(L) 11.2(L)  Hematocrit 39.0 - 52.0 % 37.8(L) 30.9(L) 34.9(L)  Platelets 150 - 400 K/uL 308 458(H) 514(H)   Iron/TIBC/Ferritin/ %Sat    Component Value Date/Time   FERRITIN 581 (H) 11/16/2019 1330   Lab Results  Component Value Date   DDIMER 1.12 (H) 11/16/2019    Lipid Panel  No results found for: CHOL, TRIG, HDL, CHOLHDL, VLDL, LDLCALC, LDLDIRECT HEMOGLOBIN A1C No results found for: HGBA1C, MPG TSH No results for input(s): TSH in the last 8760 hours.  External labs:  06/26/2020: TSH 1.78  08/28/2019: HDL 39, LDL 82, Cholesterol, total 137, Triglycerides 78.  04/19/2018: Total cholesterol 93, triglycerides 70, HDL 57, LDL 110.  Non-HDL cholesterol 126. A1c 5.4%, PSA normal.  Medications and allergies  No Known Allergies   Current Outpatient Medications  Medication Instructions  . dorzolamide (TRUSOPT) 2 % ophthalmic solution 1 drop, Both Eyes, 2 times daily  .  Latanoprostene Bunod (VYZULTA) 0.024 % SOLN 1 drop, Ophthalmic, Daily  . metoprolol tartrate (LOPRESSOR) 25 MG tablet Take 1/2 (one-half) tablet by mouth twice daily  . polyethylene glycol (MIRALAX / GLYCOLAX) 17 g, Oral, Daily  . tamsulosin (FLOMAX) 0.4 mg, Oral, Daily at bedtime  . timolol (TIMOPTIC) 0.5 % ophthalmic solution 1 drop, Both Eyes, 2 times daily  . Xarelto 20 mg, Oral, Daily with supper    Radiology:   Pulmonary Arteriography/Bilateral Pulm arterial lytic infusion cath 08/30/2019: IMPRESSION: 1. Successful fluoroscopic guided initiation of bilateral ultrasound assisted catheter directed pulmonary arterial lysis for sub massivepulmonary embolism and right-sided heart strain. 2. Elevated pressure  measurements within the main pulmonary artery compatible with critical pulmonary arterial hypertension. 3. Near occlusive DVT noted within the right common femoral vein.  Vasc US lower Extremity Venous 08/30/2019: Summary:  RIGHT: - Findings consistent with acute deep vein thrombosis involving the SF junction, and right common femoral vein.  - Findings consistent with acute superficial vein thrombosis involving the right great saphenous vein.  LEFT: - Findings consistent with acute deep vein thrombosis involving the left common femoral vein, left femoral vein, and left popliteal vein.   Percutaneous IVC Filter Placement 08/31/2019: IVC venography demonstrates a normal caliber IVC with no evidence of thrombus. Renal veins are identified bilaterally. The IVC filter was successfully positioned below the level of the renal veins and is appropriately oriented. This IVC filter has both permanent and retrievable indications. IMPRESSION: 1. Decrease in measured pulmonary artery pressure after 12 hours of bilateral pulmonary arterial thrombolytic therapy. 2. Placement of percutaneous IVC filter in infrarenal IVC. IVC venogram shows no evidence of IVC thrombus and normal caliber of the inferior  vena cava. This filter does have both permanent and retrievable indications.  Cardiac Studies:   Lexiscan Sestamibi stress test 08/08/2018: 1. Lexiscan stress test was performed. Exercise capacity was not assessed. No stress symptoms reported. Resting blood pressure was 142/82 mmHg and peak effect blood pressure was 108/56 mmHg. The resting and stress electrocardiogram demonstrated normal sinus rhythm, normal resting conduction, frequent PVC's and normal rest repolarization. Stress EKG is non diagnostic for ischemia as it is a pharmacologic stress. 2. Left ventricular cavity is noted to be enlarged on the rest and stress studies. Gated SPECT images mild decrease in myocardial thickening and wall motion. The left ventricular ejection fraction was calculated or visually estimated to be 46%. Small sized, mild intensity, fixed inferior perfusion defect likely related to tissue attenuation artifact. Findings likely represent nonischemic cardiomyopathy. 3. High risk study due to reduced LVEF and frequent PVC's.  Holter Monitor for 24 hours  09/08/2018:  Minimum heart rate 47, maximum heart rate 118 BPM, normal sinus.  PVC but this 18.3%.  20,600 beats PVCs noted 8 wearing couplets 48 wearing triplets and 435 wearing bigeminy, 135 in trigeminy.  Longest run 4 beats.  There were 102 PACs noted.  Abdominal Aortic Duplex 03/30/2019:  Moderate dilatation of the abdominal aorta is noted in the proximal aorta. An abdominal aortic aneurysm measuring 3.63 x 3.63 x 3.63 cm is seen.   Moderate heterogeneous plaque seen throughout the abdominal aorta.  Normal iliac artery velocity. Recheck in 1 year for stability in size.   Echocardiogram [place] [facility] 09/11/2019: 1. Left ventricular ejection fraction, by estimation, is 50 to 55%. The  left ventricle has low normal function. The left ventricle has no regional wall motion abnormalities.  Left ventricular diastolic parameters are  consistent with Grade I diastolic   dysfunction (impaired relaxation).  There is the interventricular septum is flattened in diastole ('D' shaped left ventricle), consistent with right  ventricular volume overload.  2. Right ventricular systolic function is normal. The right ventricular size is moderately enlarged. There is moderately elevated pulmonary artery systolic pressure.  The estimated right ventricular systolic pressure is  56.4 mmHg.  3. Right atrial size was moderately dilated.  4. The mitral valve is normal in structure and function. Trivial mitral  valve regurgitation. No evidence of mitral stenosis.  5. The aortic valve is normal in structure and function. Aortic valve  regurgitation is not visualized. No aortic stenosis is present.  6. Aortic dilatation noted.  There is mild dilatation of the ascending  aorta measuring 37 mm.  Compared to 08/09/2018, pulmonary tension and RV dilatation is new.  Lower Extremity Venous Duplex  11/28/19:  RIGHT:  - Findings consistent with chronic deep vein thrombosis involving the  right common femoral vein, and SF junction.  - No cystic structure found in the popliteal fossa.    LEFT:  - Findings consistent with age indeterminate deep vein thrombosis  involving the left femoral vein, and left popliteal vein.  - No cystic structure found in the popliteal fossa.    No significant change when compared to prior study 11/13/2019.  EKG:  11/02/2019: Normal sinus rhythm with rate of 72 bpm, borderline criteria for left atrial enlargement.  Left axis deviation, left anterior fascicular block.  Poor R progression, cannot exclude anteroseptal infarct old.  Nonspecific T abnormality.   Compared to 02/21/2019, frequent PVCs no longer present.  Assessment     ICD-10-CM   1. Presence of IVC filter (Bard Denali IVC filter) 08/31/2019  Z95.828   2. History of pulmonary embolism 08/30/2019  Z86.711   3. Frequent unifocal PVCs  I49.3   4. Dyspnea on exertion  R06.00    No orders of the  defined types were placed in this encounter.   Medications Discontinued During This Encounter  Medication Reason  . RIVAROXABAN (XARELTO) VTE STARTER PACK (15 & 20 MG TABLETS)     Recommendations:   Joe Cantu  is a 69 y.o. Black or African American [2] male patient with remote history of tobacco use, abnormal EKG frequent PVCs, aortic root dilatation, small abdominal aortic aneurysm, mild hyperlipidemia, no family history of sudden cardiac death. He was admitted to [facility] on 08/30/19 with submassive PE, acute cor pulmonale, syncope. Underwent catheter directed lytics, IVC filter placement on 08/31/2019.  He later developed hemoptysis and underwent bronchoscopy on 09/12/2019 and EGD with no evidence of an active bleed. He is now on Eliquis, hypercoagulable work-up negative and recommended continuation of anticoagulants for 6 to 12 months in view of spontaneous DVT-PE.  I discussed with him regarding IVC filter retrieval, risk of procedure including contrast nephropathy, perforation requiring emergent surgical revascularization, bleeding complications were discussed in detail.  Patient has been scheduled for lower extremity venous duplex tomorrow and if there is no significant thrombus burden we will set it up. As he is tolerating Xarelto without further hemoptysis or bleeding diathesis, I would support taking the IVC filter out, but it is not urgent and can wait, but final decision rests on all care teams.   With regard to chronic cor pulmonale and pulmonary hypertension, I will repeat echocardiogram in 3 months and I would like to see him back then.  He also has frequent PVCs so we can follow-up on his LV systolic function. He has had mild retinal detachment involving his right eye and also has glaucoma and he has seen Dr. Allyne Gee (retina specialist) who plans to do laser surgery.  Slightly complex issue in view of patient being on anticoagulation but I can certainly try to coordinate  bridging prior to the procedure if necessary.  Yates Decamp, MD, [facility] 11/27/2019, 8:42 PM [facility] Pager: [phone] Office: [phone]  CC: Dr. Ruthann Cancer.

## 2019-11-27 ENCOUNTER — Other Ambulatory Visit: Payer: Self-pay

## 2019-11-27 ENCOUNTER — Encounter: Payer: Self-pay | Admitting: Cardiology

## 2019-11-27 ENCOUNTER — Ambulatory Visit: Payer: BC Managed Care – PPO | Admitting: Cardiology

## 2019-11-27 VITALS — BP 115/65 | HR 60 | Temp 97.7°F | Ht 75.0 in | Wt 159.8 lb

## 2019-11-27 DIAGNOSIS — Z86711 Personal history of pulmonary embolism: Secondary | ICD-10-CM

## 2019-11-27 DIAGNOSIS — R0609 Other forms of dyspnea: Secondary | ICD-10-CM

## 2019-11-27 DIAGNOSIS — Z95828 Presence of other vascular implants and grafts: Secondary | ICD-10-CM

## 2019-11-27 DIAGNOSIS — R0600 Dyspnea, unspecified: Secondary | ICD-10-CM

## 2019-11-27 DIAGNOSIS — I493 Ventricular premature depolarization: Secondary | ICD-10-CM

## 2019-11-28 ENCOUNTER — Ambulatory Visit (HOSPITAL_COMMUNITY)
Admission: RE | Admit: 2019-11-28 | Discharge: 2019-11-28 | Disposition: A | Discharge status: Home / Self Care | Payer: BC Managed Care – PPO | Source: Ambulatory Visit | Attending: Oncology | Admitting: Oncology

## 2019-11-28 ENCOUNTER — Other Ambulatory Visit: Payer: Self-pay | Admitting: *Deleted

## 2019-11-28 ENCOUNTER — Telehealth: Payer: Self-pay | Admitting: *Deleted

## 2019-11-28 DIAGNOSIS — I2699 Other pulmonary embolism without acute cor pulmonale: Secondary | ICD-10-CM | POA: Diagnosis present

## 2019-11-28 DIAGNOSIS — I82402 Acute embolism and thrombosis of unspecified deep veins of left lower extremity: Secondary | ICD-10-CM | POA: Diagnosis present

## 2019-11-28 NOTE — Telephone Encounter (Signed)
I reviewed the venous duplex. I do not expect resolution of the chronic DVT with anticoagulation. My recommendation is to keep oral anticoagulation probably long term ? Lifelong, in view of bilateral DVT and chronic DVT and spontaneous PE. Filter can be taken our anytime whether now or July. Let me know. GM is probably the best judge regarding anticoagulation.  Liz Malady: [phone]

## 2019-11-28 NOTE — Telephone Encounter (Signed)
This RN was informed by Vascular that pt coming in today for scheduled doppler - may have been scheduled inappropriately.  Pt had doppler 11/13/2019 showing chronic DVT.  Pt was seen by Dr Thea Silversmith the following week with advice to keep IVF filter, continue anticoag medication and have repeat doppler in late July for further evaluation and recommendations.  MD order states expected date for doppler as 02/06/2020.  This RN called pt to inform him of above - with explanation that doppler is not needed at this time.  Joe Cantu states he was seen by Dr Jacinto Halim and they discussed obtaining the doppler today.  Joe Cantu stated concerns with keeping the filter and it "getting stuck in there and then I could bleed a lot when they take it out "  This RN reviewed with him need for IVF filter at this time due to current know DVT not yet resolved.  Joe Cantu was adamant that he would like to get the doppler for further evaluation.  This RN contacted the vascular lab and informed them of the above.

## 2019-11-28 NOTE — Progress Notes (Signed)
Bilateral lower extremity venous duplex completed. Refer to "CV Proc" under chart review to view preliminary results.  11/28/2019 11:57 AM Eula Fried., MHA, RVT, RDCS, RDMS

## 2019-11-30 ENCOUNTER — Other Ambulatory Visit: Payer: Self-pay | Admitting: Oncology

## 2019-11-30 ENCOUNTER — Telehealth: Payer: Self-pay | Admitting: Oncology

## 2019-11-30 NOTE — Telephone Encounter (Signed)
Scheduled appt per 5/20 sch message - pt aware of appt date and time

## 2019-11-30 NOTE — Progress Notes (Signed)
I called Mr. Joe Cantu to discuss removing the IVC filter.  Dr. Erlene Quan does not think continuing anticoagulation is going to really make much difference.  However Mr. Joe Cantu tells me he is not ready to have it removed.  He would like to discuss it some more.  He is going to see me in a second week in June.  He knows to call for any other issue that may develop before then.

## 2019-12-14 ENCOUNTER — Other Ambulatory Visit: Payer: Self-pay

## 2019-12-14 MED ORDER — METOPROLOL TARTRATE 25 MG PO TABS: ORAL_TABLET | ORAL | 3 refills | Status: DC

## 2019-12-17 NOTE — Progress Notes (Signed)
[facility]  Telephone:(336) [phone] Fax:(336) [phone]     ID: Joe MorganGlenn C Hymon DOB: 1951-01-28  MR#: [id number]  ZHY#:[id number]#:[id number]  Patient Care Team: Mirna MiresHill, Gerald, MD as PCP - General (Family Medicine) San Lohmeyer, Valentino HueGustav C, MD as Consulting Physician (Oncology) Lowella DellGustav C Wahid Holley, MD OTHER MD:   CHIEF COMPLAINT: massive PE and left lower extremity DVT complicated by hemoptysis while on Xarelto  CURRENT TREATMENT: Rivaroxaban; reversible IVC filter to be removed   INTERVAL HISTORY: Joe Cantu returns today for follow up of his pulmonary embolism and  DVTs.  Since his last visit, he underwent repeat lower extremity venous ultrasound on 11/28/2019, which again showed left leg DVT with no significant change from prior on 11/13/2019.  The right-sided clot appeared chronic as well.   REVIEW OF SYSTEMS: Joe Cantu has completed his physical therapy and continues to do some exercises that he was taught for his legs and arms.  He does a little walking in the house.  He is not on disability and does not feel ready to get back to work.  He does not know if he is going to be able to do his old job considering all that has been going on and how he feels right now.  He denies hemoptysis hematuria or bright red blood per rectum.  He denies balance problems or falls.  He has minimal bilateral ankle edema.  A detailed review of systems today was otherwise stable   HISTORY OF CURRENT ILLNESS: From the original intake note:   Joe Cantu is a 69 year-old [place] with a past medical history significant for PVCs, aortic root dilatation, recent diagnosis of acute massive PE with significant right heart dysfunction and left lower extremity DVT in February 2021.    He was hospitalized 08/30/2019 through 09/02/2019 for the PE and DVT.  With extensive questioning this appears to have been an unprovoked PE.  The right pulmonary artery was completely opacified, with no flow to the right lung, with  significant clot in the left side as well.  He received catheter directed TPA and was placed on a heparin drip with transition to Xarelto.  He had a retrievable IVC filter placed on 08/31/2019 because there was residual clot in the legs  The patient did well at home after discharge although he was still "taking it slow".  On  09/09/2019 he developed a persistent cough and eventually hemoptysis.  He could not quantify how much blood he coughed up however per EMS report there was approximately 300 to 600 cc of blood in a juice jug.    In the ER, his alkaline phosphatase was 211, albumin 2.3, AST 134, ALT 110, WBC 14.2, hemoglobin 10.4, INR 2.7.  His Xarelto was placed on hold and pulmonology has been consulted.  They recommended a CT of the chest and possible bronchoscopy which was done today and shows improvement but persistence of the bilateral PEs, with no obvious cause of the bleeding.  Due to concern for possible aspiration during his coughing episodes, he is currently on IV antibiotics.    Note that during his last hospitalization, a hyper coag panel was sent which was unrevealing with the exception of low protein S activity at 49%, likely reactive, heterozygosity for factor V Leiden.  We are asked to see the patient to make recommendations regarding anticoagulation in this patient with recent diagnosis of massive PE, left lower extremity DVT, and now with hemoptysis while on Xarelto.  The patient's subsequent history is as detailed  below.   PAST MEDICAL HISTORY: Past Medical History:  Diagnosis Date  . Aortic root dilatation (HCC) 02/21/2019  . Dyspnea on exertion   . Frequent unifocal PVCs 02/21/2019  . Glaucoma   . Gonorrhea    18 & 19 yrs of age; negative for syphillis, herpes and HIV  . Left inguinal hernia   . Pneumonia   . Ventricular premature beats   . Wears dentures     PAST SURGICAL HISTORY: Past Surgical History:  Procedure Laterality Date  . BRONCHIAL WASHINGS  09/12/2019     Procedure: BRONCHIAL WASHINGS;  Surgeon: Chilton Greathouse, MD;  Location: [facility];  Service: Cardiopulmonary;;  . COLONOSCOPY  07/2018   Benign polyps   . ESOPHAGOGASTRODUODENOSCOPY (EGD) WITH PROPOFOL N/A 09/15/2019   Procedure: ESOPHAGOGASTRODUODENOSCOPY (EGD) WITH PROPOFOL;  Surgeon: Jeani Hawking, MD;  Location: [facility];  Service: Endoscopy;  Laterality: N/A;  . HIATAL HERNIA REPAIR    . IR ANGIOGRAM PULMONARY BILATERAL SELECTIVE  08/30/2019  . IR INFUSION THROMBOL ARTERIAL INITIAL (MS)  08/30/2019  . IR INFUSION THROMBOL ARTERIAL INITIAL (MS)  08/30/2019  . IR IVC FILTER PLMT / S&I /IMG GUID/MOD SED  08/31/2019  . IR THROMB F/U EVAL ART/VEN FINAL DAY (MS)  08/31/2019  . IR US GUIDE VASC ACCESS LEFT  08/30/2019  . IR US GUIDE VASC ACCESS LEFT  08/30/2019  . VIDEO BRONCHOSCOPY N/A 09/12/2019   Procedure: VIDEO BRONCHOSCOPY WITHOUT FLUORO;  Surgeon: Chilton Greathouse, MD;  Location: [facility];  Service: Cardiopulmonary;  Laterality: N/A;    FAMILY HISTORY: Family History  Problem Relation Age of Onset  . Diabetes Mother   . Cancer Father        colon  . Diabetes Sister   . Diabetes Brother      SOCIAL HISTORY: (updated 09/2019)  Sahmir is single.  He reports that he has 6 children who live throughout [place].  He lives with his fiance Joe Cantu, who works as a bus attendant, and shepherds children to school buses.  The patient himself works in Production designer, theatre/television/film at [employer].  Denies history of alcohol use.  He previously smoked 1/2 pack/day of cigarettes for approximately 10 years but quit back in about 1977.    ADVANCED DIRECTIVES: The patient tells me his sister is Joe Cantu is his healthcare power of attorney.  She can be reached at [phone].   HEALTH MAINTENANCE: Social History   Tobacco Use  . Smoking status: Former Smoker    Packs/day: 0.50    Years: 10.00    Pack years: 5.00    Types: Cigarettes    Quit date: 1970    Years since  quitting: 51.4  . Smokeless tobacco: Never Used  Substance Use Topics  . Alcohol use: Yes    Comment: occasional wine  . Drug use: No     Colonoscopy: Reports history of colonoscopy about 5 years ago.  States that he had a polyp which was benign.  He is uncertain of the exact date.  Bone density: never done   No Known Allergies  Current Outpatient Medications  Medication Sig Dispense Refill  . dorzolamide (TRUSOPT) 2 % ophthalmic solution Place 1 drop into both eyes 2 (two) times daily.    . Latanoprostene Bunod (VYZULTA) 0.024 % SOLN Apply 1 drop to eye daily.    . metoprolol tartrate (LOPRESSOR) 25 MG tablet Take 1/2 (one-half) tablet by mouth twice daily 60 tablet 3  . polyethylene glycol (MIRALAX / GLYCOLAX) 17 g  packet Take 17 g by mouth daily. 14 each 0  . tamsulosin (FLOMAX) 0.4 MG CAPS capsule Take 0.4 mg by mouth at bedtime.    . timolol (TIMOPTIC) 0.5 % ophthalmic solution Place 1 drop into both eyes 2 (two) times daily.    Carlena Hurl 20 MG TABS tablet Take 20 mg by mouth daily with supper.      No current facility-administered medications for this visit.    OBJECTIVE:  African-American man who appears stated age  Vitals:   12/18/19 1405  BP: (!) 105/53  Pulse: 68  Resp: 18  Temp: 98.9 F (37.2 C)  SpO2: 100%     Body mass index is 19.5 kg/m.   Wt Readings from Last 3 Encounters:  12/18/19 156 lb (70.8 kg)  11/27/19 159 lb 12.8 oz (72.5 kg)  11/16/19 156 lb 12.8 oz (71.1 kg)      ECOG FS  Sclerae unicteric, EOMs intact Wearing a mask No cervical or supraclavicular adenopathy Lungs no rales or rhonchi Heart shows frequent premature beats Abd soft, nontender, positive bowel sounds MSK no focal spinal tenderness, minimal bilateral ankle edema Neuro: nonfocal, well oriented, positive for affect   LAB RESULTS:  CMP     Component Value Date/Time   NA 140 12/18/2019 1350   K 4.2 12/18/2019 1350   CL 109 12/18/2019 1350   CO2 23 12/18/2019 1350    GLUCOSE 107 (H) 12/18/2019 1350   BUN 12 12/18/2019 1350   CREATININE 0.91 12/18/2019 1350   CALCIUM 10.5 (H) 12/18/2019 1350   PROT 7.4 12/18/2019 1350   ALBUMIN 3.7 12/18/2019 1350   AST 15 12/18/2019 1350   ALT 12 12/18/2019 1350   ALKPHOS 67 12/18/2019 1350   BILITOT 0.7 12/18/2019 1350   GFRNONAA >60 12/18/2019 1350   GFRAA >60 12/18/2019 1350    No results found for: TOTALPROTELP, ALBUMINELP, A1GS, A2GS, BETS, BETA2SER, GAMS, MSPIKE, SPEI  Lab Results  Component Value Date   WBC 6.1 12/18/2019   NEUTROABS 3.9 12/18/2019   HGB 12.8 (L) 12/18/2019   HCT 38.2 (L) 12/18/2019   MCV 92.7 12/18/2019   PLT 259 12/18/2019    No results found for: LABCA2  No components found for: [id number]  No results for input(s): INR in the last 168 hours.  No results found for: LABCA2  No results found for: WUJ811  No results found for: BJY782  No results found for: NFA213  No results found for: CA2729  No components found for: HGQUANT  No results found for: CEA1 / No results found for: CEA1   No results found for: AFPTUMOR  No results found for: CHROMOGRNA  No results found for: KPAFRELGTCHN, LAMBDASER, KAPLAMBRATIO (kappa/lambda light chains)  No results found for: HGBA, HGBA2QUANT, HGBFQUANT, HGBSQUAN (Hemoglobinopathy evaluation)   No results found for: LDH  No results found for: IRON, TIBC, IRONPCTSAT (Iron and TIBC)  Lab Results  Component Value Date   FERRITIN 581 (H) 11/16/2019    Urinalysis No results found for: COLORURINE, APPEARANCEUR, LABSPEC, PHURINE, GLUCOSEU, HGBUR, BILIRUBINUR, KETONESUR, PROTEINUR, UROBILINOGEN, NITRITE, LEUKOCYTESUR   STUDIES: VAS [place] LOWER EXTREMITY VENOUS (DVT)  Result Date: 11/28/2019  Lower Venous DVTStudy Indications: Follow up previous DVT.  Comparison Study: 11/13/2019- right SFJ and right CFV acute DVT. Left distal                   femoral vein and left popliteal vein age indeterminate DVT. Performing Technologist:  Gertie Fey MHA, RDMS, RVT, RDCS  Examination Guidelines: A complete evaluation includes B-mode imaging, spectral Doppler, color Doppler, and power Doppler as needed of all accessible portions of each vessel. Bilateral testing is considered an integral part of a complete examination. Limited examinations for reoccurring indications may be performed as noted. The reflux portion of the exam is performed with the patient in reverse Trendelenburg.  +---------+---------------+---------+-----------+----------+--------------+ RIGHT    CompressibilityPhasicitySpontaneityPropertiesThrombus Aging +---------+---------------+---------+-----------+----------+--------------+ CFV      Partial        Yes      Yes                  Chronic        +---------+---------------+---------+-----------+----------+--------------+ SFJ      Partial                                      Chronic        +---------+---------------+---------+-----------+----------+--------------+ FV Prox  Full                                                        +---------+---------------+---------+-----------+----------+--------------+ FV Mid   Full                                                        +---------+---------------+---------+-----------+----------+--------------+ FV DistalFull                                                        +---------+---------------+---------+-----------+----------+--------------+ PFV      Full                                                        +---------+---------------+---------+-----------+----------+--------------+ POP      Full           Yes      Yes                                 +---------+---------------+---------+-----------+----------+--------------+ PTV      Full                                                        +---------+---------------+---------+-----------+----------+--------------+ PERO     Full                                                         +---------+---------------+---------+-----------+----------+--------------+   +---------+---------------+---------+-----------+----------+-----------------+ LEFT  CompressibilityPhasicitySpontaneityPropertiesThrombus Aging    +---------+---------------+---------+-----------+----------+-----------------+ CFV      Full           Yes      Yes                                    +---------+---------------+---------+-----------+----------+-----------------+ SFJ      Full                                                           +---------+---------------+---------+-----------+----------+-----------------+ FV Prox  Full                                                           +---------+---------------+---------+-----------+----------+-----------------+ FV Mid   Full                                                           +---------+---------------+---------+-----------+----------+-----------------+ FV DistalPartial                                      Age Indeterminate +---------+---------------+---------+-----------+----------+-----------------+ PFV      Full                                                           +---------+---------------+---------+-----------+----------+-----------------+ POP      Partial        Yes      Yes                  Age Indeterminate +---------+---------------+---------+-----------+----------+-----------------+ PTV      Full                                                           +---------+---------------+---------+-----------+----------+-----------------+ PERO     Full                                                           +---------+---------------+---------+-----------+----------+-----------------+     Summary: RIGHT: - Findings consistent with chronic deep vein thrombosis involving the right common femoral vein, and SF junction. - No cystic structure found in the popliteal fossa.   LEFT: - Findings consistent with age indeterminate deep vein thrombosis involving the left femoral vein, and left popliteal vein. - No cystic structure found in the popliteal  fossa.  No significant change when compared to prior study 11/13/2019.  *See table(s) above for measurements and observations. Electronically signed by Deitra Mayo MD on 11/28/2019 at 12:55:55 PM.    Final      ELIGIBLE FOR AVAILABLE RESEARCH PROTOCOL: no  ASSESSMENT: 69 y.o. [place] man with unprovoked DVT and PE, on Rivaroxaban.  1. Unprovoked DVT and PE on 08/30/2019 (a) s/p catheter directed TPA 2/17 and removable IVC filter placement 08/31/2019 (b) Rivaroxaban BID at 15 mg (c) hypercoagulable panel negative  (d) repeat CT angio 09/11/2019 shows slightly decreased bilateral large pulmonary emboli  (e) repeat bilateral lower extremity Dopplers 11/13/2019 show bilateral chronic DVTs with partial resolution on the left  (f) removal of IVC filter  2. Hemoptysis 09/10/2019 and subsequent, resolved (a) Echo 09/10/2020, EF 50-55%  (b) Bronchoscopy on 09/12/2019 shows no active bleeding or etiology (c) EGD 09/15/2019 showed normal esophagus stomach and duodenum             (d) Rivaroxaban at 15mg  daily, increased to 20 mg daily beginning 10/13/2019   3. Positive blood and urine culture: E Coli on 2/28-pan sensitive: treated, resolved  4. Deconditioning (a) status post physical therapy, with significant improvement  5. anemia: Nearly resolved   PLAN: Kadeen is continue to recover from his life-threatening episode.  His hemoglobin is almost back to normal.  He is still a bit weak but he is no longer using a walker or cane.  He is tolerating rivaroxaban well.  He is obtaining it at no cost at present.  He has no bruising or bleeding symptoms.  Given all this I think we are ready to proceed to IVC filter retrieval.  He  is agreeable.  I have gone ahead and placed the order for him to get that done within the next week or so.  He tells me he has an appointment with Dr. Einar Gip next week but he is not sure exactly why.  I told him that so long as he is under the care of Dr. Einar Gip he really does not need to see [place] except for exceptional circumstances.  I will call him once the IVC filter has been retrieved just to make sure he did well with that procedure, but I am not planning on long-term follow-up here.  I do recommend lifelong anticoagulation and he is tolerating the rivaroxaban at normal doses well.  Total encounter time 30 minutes.Sarajane Jews C. Prathik Aman, MD 12/18/2019 2:29 PM Medical Oncology and Hematology [facility] [place], [place] [address] Tel. [phone]    Fax. [phone]   This document serves as a record of services personally performed by Lurline Del, MD. It was created on his behalf by Wilburn Mylar, a trained medical scribe. The creation of this record is based on the scribe's personal observations and the provider's statements to them.   I, Lurline Del MD, have reviewed the above documentation for accuracy and completeness, and I agree with the above.   *Total Encounter Time as defined by the Centers for Medicare and Medicaid Services includes, in addition to the face-to-face time of a patient visit (documented in the note above) non-face-to-face time: obtaining and reviewing outside history, ordering and reviewing medications, tests or procedures, care coordination (communications with other health care professionals or caregivers) and documentation in the medical record.

## 2019-12-18 ENCOUNTER — Other Ambulatory Visit: Payer: Self-pay

## 2019-12-18 ENCOUNTER — Inpatient Hospital Stay: Payer: BC Managed Care – PPO | Attending: Oncology | Admitting: Oncology

## 2019-12-18 ENCOUNTER — Inpatient Hospital Stay: Payer: BC Managed Care – PPO

## 2019-12-18 VITALS — BP 105/53 | HR 68 | Temp 98.9°F | Resp 18 | Ht 75.0 in | Wt 156.0 lb

## 2019-12-18 DIAGNOSIS — R042 Hemoptysis: Secondary | ICD-10-CM | POA: Diagnosis not present

## 2019-12-18 DIAGNOSIS — Z7901 Long term (current) use of anticoagulants: Secondary | ICD-10-CM | POA: Diagnosis not present

## 2019-12-18 DIAGNOSIS — I82401 Acute embolism and thrombosis of unspecified deep veins of right lower extremity: Secondary | ICD-10-CM

## 2019-12-18 DIAGNOSIS — I2699 Other pulmonary embolism without acute cor pulmonale: Secondary | ICD-10-CM | POA: Diagnosis present

## 2019-12-18 DIAGNOSIS — I82402 Acute embolism and thrombosis of unspecified deep veins of left lower extremity: Secondary | ICD-10-CM | POA: Diagnosis not present

## 2019-12-18 LAB — COMPREHENSIVE METABOLIC PANEL
ALT: 12 U/L (ref 0–44)
AST: 15 U/L (ref 15–41)
Albumin: 3.7 g/dL (ref 3.5–5.0)
Alkaline Phosphatase: 67 U/L (ref 38–126)
Anion gap: 8 (ref 5–15)
BUN: 12 mg/dL (ref 8–23)
CO2: 23 mmol/L (ref 22–32)
Calcium: 10.5 mg/dL — ABNORMAL HIGH (ref 8.9–10.3)
Chloride: 109 mmol/L (ref 98–111)
Creatinine, Ser: 0.91 mg/dL (ref 0.61–1.24)
GFR calc Af Amer: >60 mL/min (ref >60)
GFR calc non Af Amer: >60 mL/min (ref >60)
Glucose, Bld: 107 mg/dL — ABNORMAL HIGH (ref 70–99)
Potassium: 4.2 mmol/L (ref 3.5–5.1)
Sodium: 140 mmol/L (ref 135–145)
Total Bilirubin: 0.7 mg/dL (ref 0.3–1.2)
Total Protein: 7.4 g/dL (ref 6.5–8.1)

## 2019-12-18 LAB — CBC WITH DIFFERENTIAL/PLATELET
Abs Immature Granulocytes: 0.01 10*3/uL (ref 0.00–0.07)
Basophils Absolute: 0 10*3/uL (ref 0.0–0.1)
Basophils Relative: 0 %
Eosinophils Absolute: 0 10*3/uL (ref 0.0–0.5)
Eosinophils Relative: 1 %
HCT: 38.2 % — ABNORMAL LOW (ref 39.0–52.0)
Hemoglobin: 12.8 g/dL — ABNORMAL LOW (ref 13.0–17.0)
Immature Granulocytes: 0 %
Lymphocytes Relative: 26 %
Lymphs Abs: 1.6 10*3/uL (ref 0.7–4.0)
MCH: 31.1 pg (ref 26.0–34.0)
MCHC: 33.5 g/dL (ref 30.0–36.0)
MCV: 92.7 fL (ref 80.0–100.0)
Monocytes Absolute: 0.5 10*3/uL (ref 0.1–1.0)
Monocytes Relative: 9 %
Neutro Abs: 3.9 10*3/uL (ref 1.7–7.7)
Neutrophils Relative %: 64 %
Platelets: 259 10*3/uL (ref 150–400)
RBC: 4.12 MIL/uL — ABNORMAL LOW (ref 4.22–5.81)
RDW: 13.2 % (ref 11.5–15.5)
WBC: 6.1 10*3/uL (ref 4.0–10.5)
nRBC: 0 % (ref 0.0–0.2)

## 2019-12-19 ENCOUNTER — Telehealth: Payer: Self-pay | Admitting: Oncology

## 2019-12-19 NOTE — Telephone Encounter (Signed)
Made no changes to pt's schedule based off of 6/7 los.

## 2020-01-01 ENCOUNTER — Telehealth: Payer: Self-pay

## 2020-01-09 ENCOUNTER — Encounter (HOSPITAL_COMMUNITY): Payer: Self-pay | Admitting: Cardiology

## 2020-01-09 ENCOUNTER — Ambulatory Visit (HOSPITAL_COMMUNITY)
Admission: RE | Admit: 2020-01-09 | Discharge: 2020-01-09 | Disposition: A | Discharge status: Home / Self Care | Payer: BC Managed Care – PPO | Attending: Cardiology | Admitting: Cardiology

## 2020-01-09 ENCOUNTER — Other Ambulatory Visit: Payer: Self-pay

## 2020-01-09 ENCOUNTER — Encounter (HOSPITAL_COMMUNITY)
Admission: RE | Disposition: A | Discharge status: Home / Self Care | Payer: Self-pay | Source: Home / Self Care | Attending: Cardiology

## 2020-01-09 DIAGNOSIS — Z79899 Other long term (current) drug therapy: Secondary | ICD-10-CM | POA: Insufficient documentation

## 2020-01-09 DIAGNOSIS — Z95828 Presence of other vascular implants and grafts: Secondary | ICD-10-CM

## 2020-01-09 DIAGNOSIS — I2729 Other secondary pulmonary hypertension: Secondary | ICD-10-CM | POA: Insufficient documentation

## 2020-01-09 DIAGNOSIS — Z87891 Personal history of nicotine dependence: Secondary | ICD-10-CM | POA: Insufficient documentation

## 2020-01-09 DIAGNOSIS — I2781 Cor pulmonale (chronic): Secondary | ICD-10-CM | POA: Insufficient documentation

## 2020-01-09 DIAGNOSIS — R0602 Shortness of breath: Secondary | ICD-10-CM

## 2020-01-09 DIAGNOSIS — Z7901 Long term (current) use of anticoagulants: Secondary | ICD-10-CM | POA: Insufficient documentation

## 2020-01-09 DIAGNOSIS — R0609 Other forms of dyspnea: Secondary | ICD-10-CM | POA: Diagnosis not present

## 2020-01-09 DIAGNOSIS — E785 Hyperlipidemia, unspecified: Secondary | ICD-10-CM | POA: Insufficient documentation

## 2020-01-09 HISTORY — DX: Presence of other vascular implants and grafts: Z95.828

## 2020-01-09 HISTORY — PX: RIGHT HEART CATH: CATH118263

## 2020-01-09 HISTORY — PX: IVC FILTER REMOVAL: CATH118246

## 2020-01-09 LAB — POCT I-STAT EG7
Acid-base deficit: 4 mmol/L — ABNORMAL HIGH (ref 0.0–2.0)
Bicarbonate: 23 mmol/L (ref 20.0–28.0)
Calcium, Ion: 1.6 mmol/L (ref 1.15–1.40)
HCT: 38 % — ABNORMAL LOW (ref 39.0–52.0)
Hemoglobin: 12.9 g/dL — ABNORMAL LOW (ref 13.0–17.0)
O2 Saturation: 75 %
Potassium: 4 mmol/L (ref 3.5–5.1)
Sodium: 143 mmol/L (ref 135–145)
TCO2: 24 mmol/L (ref 22–32)
pCO2, Ven: 46.8 mmHg (ref 44.0–60.0)
pH, Ven: 7.299 (ref 7.250–7.430)
pO2, Ven: 44 mmHg (ref 32.0–45.0)

## 2020-01-09 SURGERY — IVC FILTER REMOVAL
Anesthesia: LOCAL

## 2020-01-09 MED ORDER — MIDAZOLAM HCL 2 MG/2ML IJ SOLN
INTRAMUSCULAR | Status: DC | PRN
  Administered 2020-01-09: 2 mg via INTRAVENOUS

## 2020-01-09 MED ORDER — HEPARIN (PORCINE) IN NACL 1000-0.9 UT/500ML-% IV SOLN
INTRAVENOUS | Status: DC | PRN
  Administered 2020-01-09: 500 mL

## 2020-01-09 MED ORDER — IODIXANOL 320 MG/ML IV SOLN
INTRAVENOUS | Status: DC | PRN
  Administered 2020-01-09: 30 mL

## 2020-01-09 MED ORDER — FENTANYL CITRATE (PF) 100 MCG/2ML IJ SOLN
INTRAMUSCULAR | Status: DC | PRN
  Administered 2020-01-09: 25 ug via INTRAVENOUS

## 2020-01-09 MED ORDER — SODIUM CHLORIDE 0.9 % IV SOLN: INTRAVENOUS | Status: DC

## 2020-01-09 MED ORDER — FENTANYL CITRATE (PF) 100 MCG/2ML IJ SOLN
INTRAMUSCULAR | Status: AC
  Filled 2020-01-09: qty 2

## 2020-01-09 MED ORDER — LIDOCAINE HCL (PF) 1 % IJ SOLN
INTRAMUSCULAR | Status: AC
  Filled 2020-01-09: qty 30

## 2020-01-09 MED ORDER — HEPARIN (PORCINE) IN NACL 1000-0.9 UT/500ML-% IV SOLN
INTRAVENOUS | Status: AC
  Filled 2020-01-09: qty 500

## 2020-01-09 MED ORDER — LIDOCAINE HCL (PF) 1 % IJ SOLN
INTRAMUSCULAR | Status: DC | PRN
  Administered 2020-01-09: 5 mL

## 2020-01-09 MED ORDER — MIDAZOLAM HCL 2 MG/2ML IJ SOLN
INTRAMUSCULAR | Status: AC
  Filled 2020-01-09: qty 4

## 2020-01-09 SURGICAL SUPPLY — 17 items
BAG SNAP BAND KOVER 36X36 (MISCELLANEOUS) ×2 IMPLANT
CATH ANGIO 5F PIGTAIL 65CM (CATHETERS) ×1 IMPLANT
CATH SWAN GANZ 7F STRAIGHT (CATHETERS) ×1 IMPLANT
COVER DOME SNAP 22 D (MISCELLANEOUS) ×1 IMPLANT
KIT MICROPUNCTURE NIT STIFF (SHEATH) ×1 IMPLANT
KIT PV (KITS) ×2 IMPLANT
SET VENACAVA FILTER RETRIEVAL (MISCELLANEOUS) ×1 IMPLANT
SHEATH PINNACLE 7F 10CM (SHEATH) ×1 IMPLANT
SHEATH PROBE COVER 6X72 (BAG) ×1 IMPLANT
STOPCOCK MORSE 400PSI 3WAY (MISCELLANEOUS) ×1 IMPLANT
SYR MEDRAD MARK 7 150ML (SYRINGE) ×2 IMPLANT
TRANSDUCER W/STOPCOCK (MISCELLANEOUS) ×2 IMPLANT
TRAY PV CATH (CUSTOM PROCEDURE TRAY) ×2 IMPLANT
TUBING ART PRESS 72  MALE/FEM (TUBING) ×2
TUBING ART PRESS 72 MALE/FEM (TUBING) IMPLANT
TUBING INJECTOR 48 (MISCELLANEOUS) ×1 IMPLANT
WIRE EMERALD 3MM-J .035X150CM (WIRE) ×1 IMPLANT

## 2020-01-09 NOTE — H&P (Signed)
Primary Physician/Referring:  [name], MD  Patient ID: Joe Cantu, male    DOB: 05-24-1951, 69 y.o.   MRN: [id number]  No chief complaint on file.  HPI:    Joe Cantu  is a 69 y.o.  African American male patient with remote history of tobacco use, abnormal EKG frequent PVCs, aortic root dilatation, small abdominal aortic aneurysm, mild hyperlipidemia, no family history of sudden cardiac death. He was admitted to [facility] on 08/30/19 with submassive PE, acute cor pulmonale, syncope. Underwent catheter directed lytics, IVC filter placement on 08/31/2019.  He later developed hemoptysis and underwent bronchoscopy on 09/12/2019 and EGD with no evidence of an active bleed. He is now on Eliquis, hypercoagulable work-up negative and recommended continuation of anticoagulants for 6 to 12 months in view of spontaneous DVT-PE.  Is presently having mild exertional dyspnea as he has increased his physical activity but no further leg pain or leg edema, tolerating anticoagulation.  I had seen him 1 month ago and after discussions/communication with hematology, brought him in to discuss retrieval of IVC filter.   No other specific complaints. He is otherwise tolerating anticoagulation. No bleeding diathesis.   Past Medical History:  Diagnosis Date  . Aortic root dilatation (HCC) 02/21/2019  . Dyspnea on exertion   . Frequent unifocal PVCs 02/21/2019  . Glaucoma   . Gonorrhea    18 & 19 yrs of age; negative for syphillis, herpes and HIV  . Left inguinal hernia   . Pneumonia   . Ventricular premature beats   . Wears dentures    Past Surgical History:  Procedure Laterality Date  . BRONCHIAL WASHINGS  09/12/2019   Procedure: BRONCHIAL WASHINGS;  Surgeon: Chilton Greathouse, MD;  Location: [facility];  Service: Cardiopulmonary;;  . COLONOSCOPY  07/2018   Benign polyps   . ESOPHAGOGASTRODUODENOSCOPY (EGD) WITH PROPOFOL N/A 09/15/2019   Procedure: ESOPHAGOGASTRODUODENOSCOPY (EGD) WITH  PROPOFOL;  Surgeon: Jeani Hawking, MD;  Location: [facility];  Service: Endoscopy;  Laterality: N/A;  . HIATAL HERNIA REPAIR    . IR ANGIOGRAM PULMONARY BILATERAL SELECTIVE  08/30/2019  . IR INFUSION THROMBOL ARTERIAL INITIAL (MS)  08/30/2019  . IR INFUSION THROMBOL ARTERIAL INITIAL (MS)  08/30/2019  . IR IVC FILTER PLMT / S&I /IMG GUID/MOD SED  08/31/2019  . IR THROMB F/U EVAL ART/VEN FINAL DAY (MS)  08/31/2019  . IR US GUIDE VASC ACCESS LEFT  08/30/2019  . IR US GUIDE VASC ACCESS LEFT  08/30/2019  . VIDEO BRONCHOSCOPY N/A 09/12/2019   Procedure: VIDEO BRONCHOSCOPY WITHOUT FLUORO;  Surgeon: Chilton Greathouse, MD;  Location: [facility];  Service: Cardiopulmonary;  Laterality: N/A;   Social History   Tobacco Use  . Smoking status: Former Smoker    Packs/day: 0.50    Years: 10.00    Pack years: 5.00    Types: Cigarettes    Quit date: 1970    Years since quitting: 51.5  . Smokeless tobacco: Never Used  Substance Use Topics  . Alcohol use: Yes    Comment: occasional wine   Marital Status: Single  ROS  Review of Systems  Cardiovascular: Positive for dyspnea on exertion and leg swelling (left leg). Negative for chest pain.  Gastrointestinal: Negative for melena.   Objective  Blood pressure 133/80, pulse (!) 50, temperature (!) 97.5 F (36.4 C), temperature source Skin, height 6\' 3"  (1.905 m), weight 72.6 kg, SpO2 100 %.  Vitals with BMI 01/09/2020 12/18/2019 11/27/2019  Height 6\' 3"  6\' 3"  6\' 3"   Weight 160 lbs 156 lbs 159 lbs 13 oz  BMI 20 19.5 19.97  Systolic 133 105 700  Diastolic 80 53 65  Pulse 50 68 60     Physical Exam Cardiovascular:     Rate and Rhythm: Normal rate and regular rhythm. Frequent extrasystoles are present.    Pulses: Intact distal pulses.     Heart sounds: Normal heart sounds. No murmur heard.  No gallop.      Comments: 1-2+ left leg below knee leg edema. Non tender. No JVD. Pulmonary:     Effort: Pulmonary effort is normal.     Breath sounds: Normal breath  sounds.  Abdominal:     General: Bowel sounds are normal.     Palpations: Abdomen is soft.    Laboratory examination:   Recent Labs    09/22/19 0915 11/16/19 1330 12/18/19 1350  NA 136 141 140  K 3.8 3.9 4.2  CL 106 110 109  CO2 23 22 23   GLUCOSE 93 135* 107*  BUN 12 20 12   CREATININE 0.76 0.87 0.91  CALCIUM 10.3 11.3* 10.5*  GFRNONAA >60 >60 >60  GFRAA >60 >60 >60   CrCl cannot be calculated (Patient's most recent lab result is older than the maximum 21 days allowed.).  CMP Latest Ref Rng & Units 12/18/2019 11/16/2019 09/22/2019  Glucose 70 - 99 mg/dL 01/16/2020) 11/22/2019) 93  BUN 8 - 23 mg/dL 12 20 12   Creatinine 0.61 - 1.24 mg/dL 174(B 449(Q  Sodium 135 - 145 mmol/L 140 141 136  Potassium 3.5 - 5.1 mmol/L 4.2 3.9 3.8  Chloride 98 - 111 mmol/L 109 110 106  CO2 22 - 32 mmol/L 23 22 23   Calcium 8.9 - 10.3 mg/dL 10.5(H) 11.3(H) 10.3  Total Protein 6.5 - 8.1 g/dL 7.4 7.9 7.2  Total Bilirubin 0.3 - 1.2 mg/dL 0.7 0.5 0.6  Alkaline Phos 38 - 126 U/L 67 67 156(H)  AST 15 - 41 U/L 15 16 38  ALT 0 - 44 U/L 12 14 55(H)   CBC Latest Ref Rng & Units 12/18/2019 11/16/2019 09/22/2019  WBC 4.0 - 10.5 K/uL 6.1 7.7 6.4  Hemoglobin 13.0 - 17.0 g/dL 12.8(L) 12.5(L) 9.9(L)  Hematocrit 39 - 52 % 38.2(L) 37.8(L) 30.9(L)  Platelets 150 - 400 K/uL 259 308 458(H)   Iron/TIBC/Ferritin/ %Sat    Component Value Date/Time   FERRITIN 581 (H) 11/16/2019 1330   Lab Results  Component Value Date   DDIMER 1.12 (H) 11/16/2019    Lipid Panel  No results found for: CHOL, TRIG, HDL, CHOLHDL, VLDL, LDLCALC, LDLDIRECT HEMOGLOBIN A1C No results found for: HGBA1C, MPG TSH No results for input(s): TSH in the last 8760 hours.  External labs:  06/26/2020: TSH 1.78  08/28/2019: HDL 39, LDL 82, Cholesterol, total 137, Triglycerides 78.  04/19/2018: Total cholesterol 93, triglycerides 70, HDL 57, LDL 110.  Non-HDL cholesterol 126. A1c 5.4%, PSA normal.  Medications and allergies  No Known Allergies    Current Outpatient Medications  Medication Instructions  . acetaminophen (TYLENOL) 325 mg, Oral, Every 6 hours PRN  . dorzolamide (TRUSOPT) 2 % ophthalmic solution 1 drop, Both Eyes, 2 times daily  . Latanoprostene Bunod (VYZULTA) 0.024 % SOLN 1 drop, Both Eyes, Daily at bedtime  . metoprolol tartrate (LOPRESSOR) 25 MG tablet Take 1/2 (one-half) tablet by mouth twice daily  . polyethylene glycol (MIRALAX / GLYCOLAX) 17 g, Oral, Daily  . tamsulosin (FLOMAX) 0.4 mg, Oral, Daily at bedtime  . timolol (TIMOPTIC) 0.5 % ophthalmic  solution 1 drop, Both Eyes, 2 times daily  . Xarelto 20 mg, Oral, Daily with supper    Radiology:   Pulmonary Arteriography/Bilateral Pulm arterial lytic infusion cath 08/30/2019: IMPRESSION: 1. Successful fluoroscopic guided initiation of bilateral ultrasound assisted catheter directed pulmonary arterial lysis for sub massivepulmonary embolism and right-sided heart strain. 2. Elevated pressure measurements within the main pulmonary artery compatible with critical pulmonary arterial hypertension. 3. Near occlusive DVT noted within the right common femoral vein.  Vasc US lower Extremity Venous 08/30/2019: Summary:  RIGHT: - Findings consistent with acute deep vein thrombosis involving the SF junction, and right common femoral vein.  - Findings consistent with acute superficial vein thrombosis involving the right great saphenous vein.  LEFT: - Findings consistent with acute deep vein thrombosis involving the left common femoral vein, left femoral vein, and left popliteal vein.   Percutaneous IVC Filter Placement 08/31/2019: IVC venography demonstrates a normal caliber IVC with no evidence of thrombus. Renal veins are identified bilaterally. The IVC filter was successfully positioned below the level of the renal veins and is appropriately oriented. This IVC filter has both permanent and retrievable indications. IMPRESSION: 1. Decrease in measured pulmonary artery  pressure after 12 hours of bilateral pulmonary arterial thrombolytic therapy. 2. Placement of percutaneous IVC filter in infrarenal IVC. IVC venogram shows no evidence of IVC thrombus and normal caliber of the inferior vena cava. This filter does have both permanent and retrievable indications.  Cardiac Studies:   Lexiscan Sestamibi stress test 08/08/2018: 1. Lexiscan stress test was performed. Exercise capacity was not assessed. No stress symptoms reported. Resting blood pressure was 142/82 mmHg and peak effect blood pressure was 108/56 mmHg. The resting and stress electrocardiogram demonstrated normal sinus rhythm, normal resting conduction, frequent PVC's and normal rest repolarization. Stress EKG is non diagnostic for ischemia as it is a pharmacologic stress. 2. Left ventricular cavity is noted to be enlarged on the rest and stress studies. Gated SPECT images mild decrease in myocardial thickening and wall motion. The left ventricular ejection fraction was calculated or visually estimated to be 46%. Small sized, mild intensity, fixed inferior perfusion defect likely related to tissue attenuation artifact. Findings likely represent nonischemic cardiomyopathy. 3. High risk study due to reduced LVEF and frequent PVC's.  Holter Monitor for 24 hours  09/08/2018:  Minimum heart rate 47, maximum heart rate 118 BPM, normal sinus.  PVC but this 18.3%.  20,600 beats PVCs noted 8 wearing couplets 48 wearing triplets and 435 wearing bigeminy, 135 in trigeminy.  Longest run 4 beats.  There were 102 PACs noted.  Abdominal Aortic Duplex 03/30/2019:  Moderate dilatation of the abdominal aorta is noted in the proximal aorta. An abdominal aortic aneurysm measuring 3.63 x 3.63 x 3.63 cm is seen.   Moderate heterogeneous plaque seen throughout the abdominal aorta.  Normal iliac artery velocity. Recheck in 1 year for stability in size.   Echocardiogram [facility] 09/11/2019: 1. Left ventricular ejection  fraction, by estimation, is 50 to 55%. The  left ventricle has low normal function. The left ventricle has no regional wall motion abnormalities.  Left ventricular diastolic parameters are  consistent with Grade I diastolic  dysfunction (impaired relaxation).  There is the interventricular septum is flattened in diastole ('D' shaped left ventricle), consistent with right  ventricular volume overload.  2. Right ventricular systolic function is normal. The right ventricular size is moderately enlarged. There is moderately elevated pulmonary artery systolic pressure.  The estimated right ventricular systolic pressure is  56.4 mmHg.  3. Right atrial size was moderately dilated.  4. The mitral valve is normal in structure and function. Trivial mitral  valve regurgitation. No evidence of mitral stenosis.  5. The aortic valve is normal in structure and function. Aortic valve  regurgitation is not visualized. No aortic stenosis is present.  6. Aortic dilatation noted. There is mild dilatation of the ascending  aorta measuring 37 mm.  Compared to 08/09/2018, pulmonary tension and RV dilatation is new.  Lower Extremity Venous Duplex  11/28/19:  RIGHT:  - Findings consistent with chronic deep vein thrombosis involving the  right common femoral vein, and SF junction.  - No cystic structure found in the popliteal fossa.    LEFT:  - Findings consistent with age indeterminate deep vein thrombosis  involving the left femoral vein, and left popliteal vein.  - No cystic structure found in the popliteal fossa.    No significant change when compared to prior study 11/13/2019.  EKG:  11/02/2019: Normal sinus rhythm with rate of 72 bpm, borderline criteria for left atrial enlargement.  Left axis deviation, left anterior fascicular block.  Poor R progression, cannot exclude anteroseptal infarct old.  Nonspecific T abnormality.   Compared to 02/21/2019, frequent PVCs no longer present.  Assessment  1. IVC filter  in situ 2. Dyspnea on exertion 3. Long term anticoagulation status due to DVT and PE 4. Other pulmonary hypertension  Recommendations:   Joe Cantu  is a 69 y.o. Black or African American [2] male patient with remote history of tobacco use, abnormal EKG frequent PVCs, aortic root dilatation, small abdominal aortic aneurysm, mild hyperlipidemia, no family history of sudden cardiac death. He was admitted to [facility] on 08/30/19 with submassive PE, acute cor pulmonale, syncope. Underwent catheter directed lytics, IVC filter placement on 08/31/2019.  He later developed hemoptysis and underwent bronchoscopy on 09/12/2019 and EGD with no evidence of an active bleed. He is now on Eliquis, hypercoagulable work-up negative and recommended continuation of anticoagulants for 6 to 12 months in view of spontaneous DVT-PE.  I discussed with him regarding IVC filter retrieval, risk of procedure including contrast nephropathy, perforation requiring emergent surgical revascularization, bleeding complications were discussed in detail. He is tolerating Xarelto without further hemoptysis or bleeding diathesis.  I will also perform right heart cath to evaluate pulmonary hypertension in view of persistent dyspnea and  chronic cor pulmonale and pulmonary hypertension, I will also repeat echocardiogram in 3 months and I would like to see him back then.  He also has frequent PVCs so we can follow-up on his LV systolic function.   I have discussed with the patient advantages of right heart catheterization as we already have central line access. Patient is agreeable.    Yates Decamp, MD, [facility] 01/09/2020, 6:39 AM [facility] [place] Pager: [phone] Office: [phone]

## 2020-01-09 NOTE — Interval H&P Note (Signed)
History and Physical Interval Note:  01/09/2020 7:47 AM  Joe Cantu  has presented today for surgery, with the diagnosis of history of pulmonaary embolism.  The various methods of treatment have been discussed with the patient and family. After consideration of risks, benefits and other options for treatment, the patient has consented to  Procedure(s): IVC FILTER REMOVAL (N/A) RIGHT HEART CATH (N/A) as a surgical intervention.  The patient's history has been reviewed, patient examined, no change in status, stable for surgery.  I have reviewed the patient's chart and labs.  Questions were answered to the patient's satisfaction.     Yates Decamp

## 2020-01-09 NOTE — Discharge Instructions (Signed)
Venogram/ FILTER REMOVAL  A venogram, or venography, is a procedure that uses an X-ray and dye (contrast) to examine how well the veins work and how blood flows through them. Contrast helps the veins show up on X-rays. A venogram may be done:  To evaluate abnormalities in the vein.  To identify clots within veins, such as deep vein thrombosis (DVT).  To map out the veins that might be needed for another procedure. Tell a health care provider about:  Any allergies you have, especially to medicines, shellfish, iodine, and contrast.  All medicines you are taking, including vitamins, herbs, eye drops, creams, and over-the-counter medicines.  Any problems you or family members have had with anesthetic medicines.  Any blood disorders you have.  Any surgeries you have had and any complications that occurred.  Any medical conditions you have.  Whether you are pregnant, may be pregnant, or are breastfeeding.  Any history of smoking or tobacco use. What are the risks? Generally, this is a safe procedure. However, problems may occur, including:  Infection.  Bleeding.  Blood clots.  Allergic reaction to medicines or contrast.  Damage to other structures or organs.  Kidney problems.  Increased risk of cancer. Being exposed to too much radiation over a lifetime can increase the risk of cancer. The risk is small. What happens before the procedure? Medicines Ask your health care provider about:  Changing or stopping your regular medicines. This is especially important if you are taking diabetes medicines or blood thinners.  Taking medicines such as aspirin and ibuprofen. These medicines can thin your blood. Do not take these medicines unless your health care provider tells you to take them.  Taking over-the-counter medicines, vitamins, herbs, and supplements. General instructions  Follow instructions from your health care provider about eating or drinking restrictions.  You may  have blood tests to check how well your kidneys and liver are working and how well your blood can clot.  Plan to have someone take you home from the hospital or clinic. What happens during the procedure?   An IV will be inserted into one of your veins.  You may be given a medicine to help you relax (sedative).  You will lie down on an X-ray table. The table may be tilted in different directions during the procedure to help the contrast move throughout your body. Safety straps will keep you secure if the table is tilted.  If veins in your arm or leg will be examined, a band may be wrapped around that arm or leg to keep the veins full of blood. This may cause your arm or leg to feel numb.  The contrast will be injected into your IV. You may have a hot, flushed feeling as it moves throughout your body. You may also have a metallic taste in your mouth. Both of these sensations will go away after the test is complete.  You may be asked to lie in different positions or place your legs or arms in different positions.  At the end of the procedure, you may be given IV fluids to help wash or flush the contrast out of your veins.  The IV will be removed, and pressure will be applied to the IV site to prevent bleeding. A bandage (dressing) may be applied to the IV site. The exact procedure may vary among health care providers and hospitals. What can I expect after the procedure?  Your blood pressure, heart rate, breathing rate, and blood oxygen level will be  monitored until you leave the hospital or clinic.  You may be given something to eat and drink.  You may have bruising or mild discomfort in the area where the IV was inserted. Follow these instructions at home: Eating and drinking   Follow instructions from your health care provider about eating or drinking restrictions.  Drink a lot of water for the first several days after the procedure, as directed by your health care provider. This  helps to flush the contrast out of your body. Activity  Rest as told by your health care provider.  Return to your normal activities as told by your health care provider. Ask your health care provider what activities are safe for you.  If you were given a sedative during your procedure, do not drive for 24 hours or until your health care provider approves. General instructions  Check your IV insertion area every day for signs of infection. Check for: ? Redness, swelling, or pain. ? Fluid or blood. ? Warmth. ? Pus or a bad smell.  Take over-the-counter and prescription medicines only as told by your health care provider.  Keep all follow-up visits as told by your health care provider. This is important. Contact a health care provider if:  Your skin becomes itchy or you develop a rash or hives.  You have a fever that does not get better with medicine.  You feel nauseous or you vomit.  You have redness, swelling, or pain around the insertion site.  You have fluid or blood coming from the insertion site.  Your insertion area feels warm to the touch.  You have pus or a bad smell coming from the insertion site. Get help right away if you:  Have shortness of breath or difficulty breathing.  Develop chest pain.  Faint.  Feel very dizzy. These symptoms may represent a serious problem that is an emergency. Do not wait to see if the symptoms will go away. Get medical help right away. Call your local emergency services (911 in the [place] Do not drive yourself to the hospital. Summary  A venogram, or venography, is a procedure that uses an X-ray and contrast dye to check how well the veins work and how blood flows through them.  An IV will be inserted into one of your veins in order to inject the contrast.  During the exam, you will lie on an X-ray table. The table may be tilted in different directions during the procedure to help the contrast move throughout your body. Safety  straps will keep you secure.  After the procedure, you will need to drink a lot of water to help wash or flush the contrast out of your body. This information is not intended to replace advice given to you by your health care provider. Make sure you discuss any questions you have with your health care provider. Document Revised: 02/04/2019 Document Reviewed: 02/04/2019 Elsevier Patient Education  2020 [employer].

## 2020-01-22 ENCOUNTER — Ambulatory Visit: Payer: BC Managed Care – PPO

## 2020-01-22 ENCOUNTER — Other Ambulatory Visit: Payer: Self-pay

## 2020-01-22 DIAGNOSIS — I2782 Chronic pulmonary embolism: Secondary | ICD-10-CM

## 2020-01-22 DIAGNOSIS — I77810 Thoracic aortic ectasia: Secondary | ICD-10-CM

## 2020-01-22 DIAGNOSIS — I2602 Saddle embolus of pulmonary artery with acute cor pulmonale: Secondary | ICD-10-CM

## 2020-02-01 ENCOUNTER — Ambulatory Visit: Payer: BC Managed Care – PPO | Admitting: Cardiology

## 2020-02-09 ENCOUNTER — Ambulatory Visit: Payer: BC Managed Care – PPO | Admitting: Cardiology

## 2020-02-09 ENCOUNTER — Encounter: Payer: Self-pay | Admitting: Cardiology

## 2020-02-09 ENCOUNTER — Other Ambulatory Visit: Payer: Self-pay

## 2020-02-09 VITALS — BP 100/70 | HR 68 | Resp 17 | Ht 75.0 in | Wt 153.0 lb

## 2020-02-09 DIAGNOSIS — I10 Essential (primary) hypertension: Secondary | ICD-10-CM

## 2020-02-09 DIAGNOSIS — Z86711 Personal history of pulmonary embolism: Secondary | ICD-10-CM

## 2020-02-09 DIAGNOSIS — I493 Ventricular premature depolarization: Secondary | ICD-10-CM

## 2020-02-09 NOTE — Progress Notes (Signed)
Primary Physician/Referring:  [name], MD  Patient ID: Joe Cantu, male    DOB: 1950/07/19, 68 y.o.   MRN: [id number]  Chief Complaint  Patient presents with   PVCs   Follow-up   Results    echo   HPI:    Joe Cantu  is a 69 y.o.  African American male patient with remote history of tobacco use, abnormal EKG frequent PVCs, aortic root dilatation, small abdominal aortic aneurysm, mild hyperlipidemia, no family history of sudden cardiac death. He was admitted to [facility] on 08/30/19 with submassive PE, acute cor pulmonale, syncope. Underwent catheter directed lytics, IVC filter placement on 08/31/2019.  He later developed hemoptysis and underwent bronchoscopy on 09/12/2019 and EGD with no evidence of an active bleed. He is now on Eliquis, hypercoagulable work-up negative and recommended continuation of anticoagulants for 6 to 12 months in view of spontaneous DVT-PE.  He underwent right heart catheterization and also IVC filter retrieval on 01/09/2020 and did not have any periprocedural complications.  Except for very mild dyspnea on exertion, he remains asymptomatic.  He has not had any further palpitations.  Past Medical History:  Diagnosis Date   Aortic root dilatation (HCC) 02/21/2019   Dyspnea on exertion    Frequent unifocal PVCs 02/21/2019   Glaucoma    Gonorrhea    18 & 19 yrs of age; negative for syphillis, herpes and HIV   Left inguinal hernia    Pneumonia    Ventricular premature beats    Wears dentures    Past Surgical History:  Procedure Laterality Date   BRONCHIAL WASHINGS  09/12/2019   Procedure: BRONCHIAL WASHINGS;  Surgeon: Chilton Greathouse, MD;  Location: [facility];  Service: Cardiopulmonary;;   COLONOSCOPY  07/2018   Benign polyps    ESOPHAGOGASTRODUODENOSCOPY (EGD) WITH PROPOFOL N/A 09/15/2019   Procedure: ESOPHAGOGASTRODUODENOSCOPY (EGD) WITH PROPOFOL;  Surgeon: Jeani Hawking, MD;  Location: [facility];  Service: Endoscopy;   Laterality: N/A;   HIATAL HERNIA REPAIR     IR ANGIOGRAM PULMONARY BILATERAL SELECTIVE  08/30/2019   IR INFUSION THROMBOL ARTERIAL INITIAL (MS)  08/30/2019   IR INFUSION THROMBOL ARTERIAL INITIAL (MS)  08/30/2019   IR IVC FILTER PLMT / S&I /IMG GUID/MOD SED  08/31/2019   IR THROMB F/U EVAL ART/VEN FINAL DAY (MS)  08/31/2019   IR US GUIDE VASC ACCESS LEFT  08/30/2019   IR US GUIDE VASC ACCESS LEFT  08/30/2019   IVC FILTER REMOVAL N/A 01/09/2020   Procedure: IVC FILTER REMOVAL;  Surgeon: Yates Decamp, MD;  Location: [facility] INVASIVE CV LAB;  Service: Cardiovascular;  Laterality: N/A;   RIGHT HEART CATH N/A 01/09/2020   Procedure: RIGHT HEART CATH;  Surgeon: Yates Decamp, MD;  Location: [facility] INVASIVE CV LAB;  Service: Cardiovascular;  Laterality: N/A;   VIDEO BRONCHOSCOPY N/A 09/12/2019   Procedure: VIDEO BRONCHOSCOPY WITHOUT FLUORO;  Surgeon: Chilton Greathouse, MD;  Location: [facility];  Service: Cardiopulmonary;  Laterality: N/A;   Social History   Tobacco Use   Smoking status: Former Smoker    Packs/day: 0.50    Years: 10.00    Pack years: 5.00    Types: Cigarettes    Quit date: 1970    Years since quitting: 51.6   Smokeless tobacco: Never Used  Substance Use Topics   Alcohol use: Yes    Comment: occasional wine   Marital Status: Single  ROS  Review of Systems  Cardiovascular: Positive for dyspnea on exertion and leg swelling (left leg). Negative  for chest pain.  Gastrointestinal: Negative for melena.   Objective  Blood pressure 100/70, pulse 68, resp. rate 17, height 6\' 3"  (1.905 m), weight 153 lb (69.4 kg), SpO2 95 %.  Vitals with BMI 02/09/2020 01/09/2020 01/09/2020  Height 6\' 3"  - -  Weight 153 lbs - -  BMI 19.12 - -  Systolic 100 150 -  Diastolic 70 81 -  Pulse 68 37 0     Physical Exam Cardiovascular:     Rate and Rhythm: Normal rate and regular rhythm.     Pulses: Intact distal pulses.     Heart sounds: Normal heart sounds. No murmur heard.  No gallop.       Comments: Trace left leg below knee leg edema. Non tender. No JVD. Pulmonary:     Effort: Pulmonary effort is normal.     Breath sounds: Normal breath sounds.  Abdominal:     General: Bowel sounds are normal.     Palpations: Abdomen is soft.    Laboratory examination:   Recent Labs    09/22/19 0915 09/22/19 0915 11/16/19 1330 12/18/19 1350 01/09/20 0815  NA 136   < > 141 140 143  K 3.8   < > 3.9 4.2 4.0  CL 106  --  110 109  --   CO2 23  --  22 23  --   GLUCOSE 93  --  135* 107*  --   BUN 12  --  20 12  --   CREATININE 0.76  --  0.87 0.91  --   CALCIUM 10.3  --  11.3* 10.5*  --   GFRNONAA >60  --  >60 >60  --   GFRAA >60  --  >60 >60  --    < > = values in this interval not displayed.   CrCl cannot be calculated (Patient's most recent lab result is older than the maximum 21 days allowed.).  CMP Latest Ref Rng & Units 01/09/2020 12/18/2019 11/16/2019  Glucose 70 - 99 mg/dL - 161(W107(H) 960(A135(H)  BUN 8 - 23 mg/dL - 12 20  Creatinine 5.400.61 - 1.24 mg/dL - 9.810.91 1.910.87  Sodium 478135 - 145 mmol/L 143 140 141  Potassium 3.5 - 5.1 mmol/L 4.0 4.2 3.9  Chloride 98 - 111 mmol/L - 109 110  CO2 22 - 32 mmol/L - 23 22  Calcium 8.9 - 10.3 mg/dL - 10.5(H) 11.3(H)  Total Protein 6.5 - 8.1 g/dL - 7.4 7.9  Total Bilirubin 0.3 - 1.2 mg/dL - 0.7 0.5  Alkaline Phos 38 - 126 U/L - 67 67  AST 15 - 41 U/L - 15 16  ALT 0 - 44 U/L - 12 14   CBC Latest Ref Rng & Units 01/09/2020 12/18/2019 11/16/2019  WBC 4.0 - 10.5 K/uL - 6.1 7.7  Hemoglobin 13.0 - 17.0 g/dL 12.9(L) 12.8(L) 12.5(L)  Hematocrit 39 - 52 % 38.0(L) 38.2(L) 37.8(L)  Platelets 150 - 400 K/uL - 259 308   Iron/TIBC/Ferritin/ %Sat    Component Value Date/Time   FERRITIN 581 (H) 11/16/2019 1330   Lab Results  Component Value Date   DDIMER 1.12 (H) 11/16/2019    Lipid Panel  No results found for: CHOL, TRIG, HDL, CHOLHDL, VLDL, LDLCALC, LDLDIRECT HEMOGLOBIN A1C No results found for: HGBA1C, MPG TSH No results for input(s): TSH in the last  8760 hours.  External labs:  06/26/2020: TSH 1.78  08/28/2019: HDL 39, LDL 82, Cholesterol, total 137, Triglycerides 78.  04/19/2018: Total cholesterol 93, triglycerides 70,  HDL 57, LDL 110.  Non-HDL cholesterol 126. A1c 5.4%, PSA normal.  Medications and allergies  No Known Allergies   Current Outpatient Medications  Medication Instructions   acetaminophen (TYLENOL) 325 mg, Oral, Every 6 hours PRN   dorzolamide (TRUSOPT) 2 % ophthalmic solution 1 drop, Both Eyes, 2 times daily   Latanoprostene Bunod (VYZULTA) 0.024 % SOLN 1 drop, Both Eyes, Daily at bedtime   metoprolol tartrate (LOPRESSOR) 25 MG tablet Take 1/2 (one-half) tablet by mouth twice daily   polyethylene glycol (MIRALAX / GLYCOLAX) 17 g, Oral, Daily   tamsulosin (FLOMAX) 0.4 mg, Oral, Daily at bedtime   timolol (TIMOPTIC) 0.5 % ophthalmic solution 1 drop, Both Eyes, 2 times daily   Xarelto 20 mg, Oral, Daily with supper    Radiology:   Pulmonary Arteriography/Bilateral Pulm arterial lytic infusion cath 08/30/2019: IMPRESSION: 1. Successful fluoroscopic guided initiation of bilateral ultrasound assisted catheter directed pulmonary arterial lysis for sub massivepulmonary embolism and right-sided heart strain. 2. Elevated pressure measurements within the main pulmonary artery compatible with critical pulmonary arterial hypertension. 3. Near occlusive DVT noted within the right common femoral vein.  Vasc US lower Extremity Venous 08/30/2019: Summary:  RIGHT: - Findings consistent with acute deep vein thrombosis involving the SF junction, and right common femoral vein.  - Findings consistent with acute superficial vein thrombosis involving the right great saphenous vein.  LEFT: - Findings consistent with acute deep vein thrombosis involving the left common femoral vein, left femoral vein, and left popliteal vein.   Percutaneous IVC Filter Placement 08/31/2019: IVC venography demonstrates a normal caliber IVC with  no evidence of thrombus. Renal veins are identified bilaterally. The IVC filter was successfully positioned below the level of the renal veins and is appropriately oriented. This IVC filter has both permanent and retrievable indications. IMPRESSION: 1. Decrease in measured pulmonary artery pressure after 12 hours of bilateral pulmonary arterial thrombolytic therapy. 2. Placement of percutaneous IVC filter in infrarenal IVC. IVC venogram shows no evidence of IVC thrombus and normal caliber of the inferior vena cava. This filter does have both permanent and retrievable indications.  Cardiac Studies:   Lexiscan Sestamibi stress test 08/08/2018: 1. Lexiscan stress test was performed. Exercise capacity was not assessed. No stress symptoms reported. Resting blood pressure was 142/82 mmHg and peak effect blood pressure was 108/56 mmHg. The resting and stress electrocardiogram demonstrated normal sinus rhythm, normal resting conduction, frequent PVC's and normal rest repolarization. Stress EKG is non diagnostic for ischemia as it is a pharmacologic stress. 2. Left ventricular cavity is noted to be enlarged on the rest and stress studies. Gated SPECT images mild decrease in myocardial thickening and wall motion. The left ventricular ejection fraction was calculated or visually estimated to be 46%. Small sized, mild intensity, fixed inferior perfusion defect likely related to tissue attenuation artifact. Findings likely represent nonischemic cardiomyopathy. 3. High risk study due to reduced LVEF and frequent PVC's.  Holter Monitor for 24 hours  09/08/2018:  Minimum heart rate 47, maximum heart rate 118 BPM, normal sinus.  PVC but this 18.3%.  20,600 beats PVCs noted 8 wearing couplets 48 wearing triplets and 435 wearing bigeminy, 135 in trigeminy.  Longest run 4 beats.  There were 102 PACs noted.  Abdominal Aortic Duplex 03/30/2019:  Moderate dilatation of the abdominal aorta is noted in the proximal  aorta. An abdominal aortic aneurysm measuring 3.63 x 3.63 x 3.63 cm is seen.   Moderate heterogeneous plaque seen throughout the abdominal aorta.  Normal iliac artery velocity.  Recheck in 1 year for stability in size.   Lower Extremity Venous Duplex  11/28/19:  RIGHT: - Findings consistent with chronic deep vein thrombosis involving the  right common femoral vein, and SF junction.  - No cystic structure found in the popliteal fossa.  LEFT: - Findings consistent with age indeterminate deep vein thrombosisinvolving the left femoral vein, and left popliteal vein.  - No cystic structure found in the popliteal fossa. No significant change when compared to prior study 11/13/2019.  Right heart catheterization and IVC filter retrieval 01/09/2020: Normal right heart catheterization pressures. Successful retrieval of Bard IVC filter without complications.  Echocardiogram 01/22/2020:  Left ventricle cavity is normal in size. Mild concentric hypertrophy of the left ventricle. Normal global wall motion. Normal LV systolic function  with EF 57%. Doppler evidence of grade I (impaired) diastolic dysfunction, normal LAP.  Trileaflet aortic valve. Mild (Grade I) aortic regurgitation.  Myxomatous degeneration with mild bileaflet prolapse. Moderate (Grade II) mitral regurgitation.  Moderate tricuspid regurgitation. Estimated pulmonary artery systolic pressure is 44 mmHg.  Mild pulmonic regurgitation.  The aortic root is mildly dilated at 4.0 cm.  Compared to the study done on 09/11/2019, markedly enlarged right ventricle, D-shaped interventricular septum no longer present.  PA pressure previously was measured at 56 mmHg.   EKG:  11/02/2019: Normal sinus rhythm with rate of 72 bpm, borderline criteria for left atrial enlargement.  Left axis deviation, left anterior fascicular block.  Poor R progression, cannot exclude anteroseptal infarct old.  Nonspecific T abnormality.   Compared to 02/21/2019, frequent PVCs no  longer present.  Assessment     ICD-10-CM   1. Frequent unifocal PVCs  I49.3   2. Essential hypertension  I10   3. History of pulmonary embolism 08/30/2019  Z86.711    No orders of the defined types were placed in this encounter.   There are no discontinued medications.  Recommendations:   ZENON LEAF  is a 69 y.o. Black or African American [2] male patient with remote history of tobacco use, abnormal EKG frequent PVCs, aortic root dilatation, small abdominal aortic aneurysm, mild hyperlipidemia, no family history of sudden cardiac death. He was admitted to [facility] on 08/30/19 with submassive PE, acute cor pulmonale, syncope. Underwent catheter directed lytics, IVC filter placement on 08/31/2019.  He later developed hemoptysis and underwent bronchoscopy on 09/12/2019 and EGD with no evidence of an active bleed. He is now on Eliquis, hypercoagulable work-up negative and recommended continuation of anticoagulants for 6 to 12 months in view of spontaneous DVT-PE.  He underwent right heart catheterization and also IVC filter retrieval on 01/09/2020 and did not have any periprocedural complications.  Fortunately his pulmonary pressures were within normal limits.  I reviewed the recently performed echocardiogram which reveals no further RV strain although PA pressure is still mild to moderately elevated but improved from previous.  I am very pleased with his progress, he is tolerating beta-blocker with regard to frequent PVCs, blood pressure is also well controlled, I will see him back in 1 year, he will continue close monitoring and follow-up with hematology with regard to duration of therapy for pulmonary embolism with anticoagulants.    Yates Decamp, MD, [facility] 02/10/2020, 2:12 PM Office: [phone]   CC: Jeanette Caprice, MD

## 2020-02-12 ENCOUNTER — Encounter: Payer: Self-pay | Admitting: Cardiology

## 2020-02-16 ENCOUNTER — Ambulatory Visit: Payer: BC Managed Care – PPO | Admitting: Cardiology

## 2020-02-28 ENCOUNTER — Telehealth: Payer: Self-pay | Admitting: Oncology

## 2020-02-28 NOTE — Telephone Encounter (Signed)
Pt called to r/s appt on sept 10 to see Dr. Darnelle Catalan per 8/18 sch message. Pt changed mind and decided to keep appt on 9/10 with Mardella Layman.

## 2020-02-28 NOTE — Telephone Encounter (Signed)
Rescheduled 9/7 appt to 9/10 with LC. GM on PAL. Pt confirmed new appt date and time.

## 2020-03-12 ENCOUNTER — Telehealth: Payer: Self-pay | Admitting: Adult Health

## 2020-03-12 NOTE — Telephone Encounter (Signed)
Rescheduled appt per LC's request to be out of the office. Pt confirmed cancellation and new appt date and time.

## 2020-03-19 ENCOUNTER — Ambulatory Visit: Payer: BC Managed Care – PPO | Admitting: Oncology

## 2020-03-22 ENCOUNTER — Inpatient Hospital Stay: Payer: BC Managed Care – PPO | Admitting: Adult Health

## 2020-03-29 ENCOUNTER — Inpatient Hospital Stay: Payer: BC Managed Care – PPO | Attending: Oncology | Admitting: Adult Health

## 2020-03-29 ENCOUNTER — Other Ambulatory Visit: Payer: Self-pay

## 2020-03-29 ENCOUNTER — Encounter: Payer: Self-pay | Admitting: Adult Health

## 2020-03-29 ENCOUNTER — Telehealth: Payer: Self-pay | Admitting: Oncology

## 2020-03-29 VITALS — BP 132/63 | HR 70 | Temp 97.7°F | Resp 17 | Ht 75.0 in | Wt 162.4 lb

## 2020-03-29 DIAGNOSIS — Z79899 Other long term (current) drug therapy: Secondary | ICD-10-CM | POA: Insufficient documentation

## 2020-03-29 DIAGNOSIS — Z86711 Personal history of pulmonary embolism: Secondary | ICD-10-CM | POA: Insufficient documentation

## 2020-03-29 DIAGNOSIS — Z87891 Personal history of nicotine dependence: Secondary | ICD-10-CM | POA: Diagnosis not present

## 2020-03-29 DIAGNOSIS — Z833 Family history of diabetes mellitus: Secondary | ICD-10-CM | POA: Diagnosis not present

## 2020-03-29 DIAGNOSIS — I2699 Other pulmonary embolism without acute cor pulmonale: Secondary | ICD-10-CM

## 2020-03-29 DIAGNOSIS — I82402 Acute embolism and thrombosis of unspecified deep veins of left lower extremity: Secondary | ICD-10-CM

## 2020-03-29 DIAGNOSIS — Z7901 Long term (current) use of anticoagulants: Secondary | ICD-10-CM | POA: Diagnosis not present

## 2020-03-29 DIAGNOSIS — Z86718 Personal history of other venous thrombosis and embolism: Secondary | ICD-10-CM | POA: Insufficient documentation

## 2020-03-29 NOTE — Progress Notes (Signed)
Joe Cantu  [phone] [phone]     ID: Joe Cantu DOB: 1951-07-04  MR#: [id number]  NID#:[id number]  Patient Care Team: [name], MD as PCP - General (Family Medicine) Magrinat, Valentino Hue, MD as Consulting Physician (Oncology) Noreene Filbert, NP OTHER MD:   CHIEF COMPLAINT: massive PE and left lower extremity DVT complicated by hemoptysis while on Xarelto  CURRENT TREATMENT: Rivaroxaban   INTERVAL HISTORY: Joe Cantu returns today for follow up of his pulmonary embolism and  DVTs.  His most recent lower extremity venous ultrasound on 11/28/2019, which again showed left leg DVT with no significant change from prior on 11/13/2019.  The right-sided clot appeared chronic as well.  He continues on Xarelto at 20mg  daily with good tolerance.  He denies any increased bruising or bleeding.   He underwent IVC filter removal on 01/09/2020.     REVIEW OF SYSTEMS: Joe Cantu notes that he is feeling well.  He denies any new issues, and has no cough, shortness of breath, swelling, fever, chills, bowel/bladder changes, headaches, vision changes, or any other concerns.  A detailed ROS was otherwise non contributory.    HISTORY OF CURRENT ILLNESS: From the original intake note:   Joe Cantu is a 69 year-old 73 man with a past medical history significant for PVCs, aortic root dilatation, recent diagnosis of acute massive PE with significant right heart dysfunction and left lower extremity DVT in February 2021.    He was hospitalized 08/30/2019 through 09/02/2019 for the PE and DVT.  With extensive questioning this appears to have been an unprovoked PE.  The right pulmonary artery was completely opacified, with no flow to the right lung, with significant clot in the left side as well.  He received catheter directed TPA and was placed on a heparin drip with transition to Xarelto.  He had a retrievable IVC filter placed on 08/31/2019 because there was residual  clot in the legs  The patient did well at home after discharge although he was still "taking it slow".  On  09/09/2019 he developed a persistent cough and eventually hemoptysis.  He could not quantify how much blood he coughed up however per EMS report there was approximately 300 to 600 cc of blood in a juice jug.    In the ER, his alkaline phosphatase was 211, albumin 2.3, AST 134, ALT 110, WBC 14.2, hemoglobin 10.4, INR 2.7.  His Xarelto was placed on hold and pulmonology has been consulted.  They recommended a CT of the chest and possible bronchoscopy which was done today and shows improvement but persistence of the bilateral PEs, with no obvious cause of the bleeding.  Due to concern for possible aspiration during his coughing episodes, he is currently on IV antibiotics.    Note that during his last hospitalization, a hyper coag panel was sent which was unrevealing with the exception of low protein S activity at 49%, likely reactive, heterozygosity for factor V Leiden.  We are asked to see the patient to make recommendations regarding anticoagulation in this patient with recent diagnosis of massive PE, left lower extremity DVT, and now with hemoptysis while on Xarelto.  The patient's subsequent history is as detailed below.   PAST MEDICAL HISTORY: Past Medical History:  Diagnosis Date  . Aortic root dilatation (HCC) 02/21/2019  . Dyspnea on exertion   . Frequent unifocal PVCs 02/21/2019  . Glaucoma   . Gonorrhea    68 & 19 yrs of age; negative for syphillis,  herpes and HIV  . Left inguinal hernia   . Pneumonia   . Ventricular premature beats   . Wears dentures     PAST SURGICAL HISTORY: Past Surgical History:  Procedure Laterality Date  . BRONCHIAL WASHINGS  09/12/2019   Procedure: BRONCHIAL WASHINGS;  Surgeon: Chilton GreathouseMannam, Praveen, MD;  Location: [facility];  Service: Cardiopulmonary;;  . COLONOSCOPY  07/2018   Benign polyps   . ESOPHAGOGASTRODUODENOSCOPY (EGD) WITH PROPOFOL N/A  09/15/2019   Procedure: ESOPHAGOGASTRODUODENOSCOPY (EGD) WITH PROPOFOL;  Surgeon: Jeani HawkingHung, Patrick, MD;  Location: [facility];  Service: Endoscopy;  Laterality: N/A;  . HIATAL HERNIA REPAIR    . IR ANGIOGRAM PULMONARY BILATERAL SELECTIVE  08/30/2019  . IR INFUSION THROMBOL ARTERIAL INITIAL (MS)  08/30/2019  . IR INFUSION THROMBOL ARTERIAL INITIAL (MS)  08/30/2019  . IR IVC FILTER PLMT / S&I /IMG GUID/MOD SED  08/31/2019  . IR THROMB F/U EVAL ART/VEN FINAL DAY (MS)  08/31/2019  . IR US GUIDE VASC ACCESS LEFT  08/30/2019  . IR US GUIDE VASC ACCESS LEFT  08/30/2019  . IVC FILTER REMOVAL N/A 01/09/2020   Procedure: IVC FILTER REMOVAL;  Surgeon: Yates DecampGanji, Jay, MD;  [facility]: [facility];  Service: Cardiovascular;  Laterality: N/A;  . RIGHT HEART CATH N/A 01/09/2020   Procedure: RIGHT HEART CATH;  Surgeon: Yates DecampGanji, Jay, MD;  [facility];  Service: Cardiovascular;  Laterality: N/A;  . VIDEO BRONCHOSCOPY N/A 09/12/2019   Procedure: VIDEO BRONCHOSCOPY WITHOUT FLUORO;  Surgeon: Chilton GreathouseMannam, Praveen, MD;  Location: [facility];  Service: Cardiopulmonary;  Laterality: N/A;    FAMILY HISTORY: Family History  Problem Relation Age of Onset  . Diabetes Mother   . Cancer Father        colon  . Diabetes Sister   . Diabetes Brother      SOCIAL HISTORY: (updated 09/2019)  Joe MaplesGlenn is single.  He reports that he has 6 children who live throughout [place].  He lives with his fiance Joe Cantu, who works as a bus attendant, and shepherds children to school buses.  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  Denies history of alcohol use.  He previously smoked 1/2 pack/day of cigarettes for approximately 10 years but quit back in about 1977.     ADVANCED DIRECTIVES: The patient tells me his sister is Joe MainSandra Cantu is his healthcare power of attorney.  She can be reached at [phone].   HEALTH MAINTENANCE: Social History   Tobacco Use  . Smoking status:  Former Smoker    Packs/day: 0.50    Years: 10.00    Pack years: 5.00    Types: Cigarettes    Quit date: 1970    Years since quitting: 51.7  . Smokeless tobacco: Never Used  Vaping Use  . Vaping Use: Never used  Substance Use Topics  . Alcohol use: Yes    Comment: occasional wine  . Drug use: No     Colonoscopy: Reports history of colonoscopy about 5 years ago.  States that he had a polyp which was benign.  He is uncertain of the exact date.  Bone density: never done   No Known Allergies  Current Outpatient Medications  Medication Sig Dispense Refill  . acetaminophen (TYLENOL) 325 MG tablet Take 325 mg by mouth every 6 (six) hours as needed for mild pain or headache.    . dorzolamide (TRUSOPT) 2 % ophthalmic solution Place 1 drop into both eyes 2 (two) times daily.    .Marland Kitchen  Latanoprostene Bunod (VYZULTA) 0.024 % SOLN Place 1 drop into both eyes at bedtime.     . metoprolol tartrate (LOPRESSOR) 25 MG tablet Take 1/2 (one-half) tablet by mouth twice daily (Patient taking differently: Take 12.5 mg by mouth 2 (two) times daily. ) 60 tablet 3  . polyethylene glycol (MIRALAX / GLYCOLAX) 17 g packet Take 17 g by mouth daily. (Patient taking differently: Take 17 g by mouth daily as needed for mild constipation or moderate constipation. ) 14 each 0  . tamsulosin (FLOMAX) 0.4 MG CAPS capsule Take 0.4 mg by mouth at bedtime.    . timolol (TIMOPTIC) 0.5 % ophthalmic solution Place 1 drop into both eyes 2 (two) times daily.    Joe Cantu 20 MG TABS tablet Take 20 mg by mouth daily with supper.      No current facility-administered medications for this visit.    OBJECTIVE:  African-American man who appears stated age  Vitals:   03/29/20 1553  BP: 132/63  Pulse: 70  Resp: 17  Temp: 97.7 F (36.5 C)  SpO2: 100%     Body mass index is 20.3 kg/m.   Wt Readings from Last 3 Encounters:  03/29/20 162 lb 6.4 oz (73.7 kg)  02/09/20 153 lb (69.4 kg)  01/09/20 160 lb (72.6 kg)      ECOG  FS-0 GENERAL: Patient is a well appearing male in no acute distress HEENT:  Sclerae anicteric.  Mask in place. Neck is supple.  NODES:  No cervical, supraclavicular, or axillary lymphadenopathy palpated.  BREAST EXAM:  Deferred. LUNGS:  Clear to auscultation bilaterally.  No wheezes or rhonchi. HEART:  Regular rate and rhythm. No murmur appreciated. ABDOMEN:  Soft, nontender.  Positive, normoactive bowel sounds. No organomegaly palpated. MSK:  No focal spinal tenderness to palpation. Full range of motion bilaterally in the upper extremities. EXTREMITIES:  No peripheral edema.   SKIN:  Clear with no obvious rashes or skin changes. No nail dyscrasia. NEURO:  Nonfocal. Well oriented.  Appropriate affect.     LAB RESULTS:  CMP     Component Value Date/Time   NA 143 01/09/2020 0815   K 4.0 01/09/2020 0815   CL 109 12/18/2019 1350   CO2 23 12/18/2019 1350   GLUCOSE 107 (H) 12/18/2019 1350   BUN 12 12/18/2019 1350   CREATININE 0.91 12/18/2019 1350   CALCIUM 10.5 (H) 12/18/2019 1350   PROT 7.4 12/18/2019 1350   ALBUMIN 3.7 12/18/2019 1350   AST 15 12/18/2019 1350   ALT 12 12/18/2019 1350   ALKPHOS 67 12/18/2019 1350   BILITOT 0.7 12/18/2019 1350   GFRNONAA >60 12/18/2019 1350   GFRAA >60 12/18/2019 1350    No results found for: TOTALPROTELP, ALBUMINELP, A1GS, A2GS, BETS, BETA2SER, GAMS, MSPIKE, SPEI  Lab Results  Component Value Date   WBC 6.1 12/18/2019   NEUTROABS 3.9 12/18/2019   HGB 12.9 (L) 01/09/2020   HCT 38.0 (L) 01/09/2020   MCV 92.7 12/18/2019   PLT 259 12/18/2019    No results found for: LABCA2  No components found for: EXHBZJ696  No results for input(s): INR in the last 168 hours.  No results found for: LABCA2  No results found for: VEL381  No results found for: OFB510  No results found for: CHE527  No results found for: CA2729  No components found for: HGQUANT  No results found for: CEA1 / No results found for: CEA1   No results found for:  AFPTUMOR  No results found for: [facility]  No results found for: KPAFRELGTCHN, LAMBDASER, KAPLAMBRATIO (kappa/lambda light chains)  No results found for: HGBA, HGBA2QUANT, HGBFQUANT, HGBSQUAN (Hemoglobinopathy evaluation)   No results found for: LDH  No results found for: IRON, TIBC, IRONPCTSAT (Iron and TIBC)  Lab Results  Component Value Date   FERRITIN 581 (H) 11/16/2019    Urinalysis No results found for: COLORURINE, APPEARANCEUR, LABSPEC, PHURINE, GLUCOSEU, HGBUR, BILIRUBINUR, KETONESUR, PROTEINUR, UROBILINOGEN, NITRITE, LEUKOCYTESUR   STUDIES: No results found.   ELIGIBLE FOR AVAILABLE RESEARCH PROTOCOL: no  ASSESSMENT: 69 y.o. [place] man with unprovoked DVT and PE, on Rivaroxaban.  1. Unprovoked DVT and PE on 08/30/2019 (a) s/p catheter directed TPA 2/17 and removable IVC filter placement 08/31/2019 (b) Rivaroxaban BID at 15 mg (c) hypercoagulable panel negative  (d) repeat CT angio 09/11/2019 shows slightly decreased bilateral large pulmonary emboli  (e) repeat bilateral lower extremity Dopplers 11/13/2019 show bilateral chronic DVTs with partial resolution on the left  (f) removal of IVC filter  2. Hemoptysis 09/10/2019 and subsequent, resolved (a) Echo 09/11/2019, EF 50-55%  (b) Bronchoscopy on 09/12/2019 shows no active bleeding or etiology (c) EGD 09/15/2019 showed normal esophagus stomach and duodenum             (d) Rivaroxaban at 15mg  daily, increased to 20 mg daily beginning 10/13/2019   3. Positive blood and urine culture: E Coli on 2/28-pan sensitive: treated, resolved  4. Deconditioning (a) status post physical therapy, with significant improvement  5. anemia: Nearly resolved   PLAN: Joe Cantu continues on Xarelto daily for his h/o DVT.  He underwent the IVC filter retrieval.  I let him know that our recommendation is for him to stay on lifelong  anticoagulation.  He continues to tolerate it well.    We will see him in 6 monts for lab only and will see Dr. Sherrine Cantu in one year.  He understands the above and is in agremeent with it.    Total encounter time 20 minutes.Darnelle Catalan, NP 03/29/20 4:05 PM Medical Oncology and Hematology [facility] [address] [place], [place] Tel. [phone]    Fax. [phone]    *Total Encounter Time as defined by the Centers for Medicare and Medicaid Services includes, in addition to the face-to-face time of a patient visit (documented in the note above) non-face-to-face time: obtaining and reviewing outside history, ordering and reviewing medications, tests or procedures, care coordination (communications with other health care professionals or caregivers) and documentation in the medical record.

## 2020-03-29 NOTE — Telephone Encounter (Signed)
Scheduled appts per 9/17 los. Gave pt a print out of AVS.  

## 2020-05-09 ENCOUNTER — Telehealth: Payer: Self-pay

## 2020-05-09 NOTE — Telephone Encounter (Signed)
Yes, safe. They usually pull teeth on Xarelto.

## 2020-05-09 NOTE — Telephone Encounter (Signed)
Relayed information to patient. Patient voiced understanding.  

## 2020-05-09 NOTE — Telephone Encounter (Signed)
Patient states he is having 8 teeth pulled tomorrow and would like to know if this is safe for him to do. Patient is going to be awake during the procedure. Please advise. Thanks!

## 2020-05-14 ENCOUNTER — Ambulatory Visit: Payer: BC Managed Care – PPO | Attending: Internal Medicine

## 2020-05-14 DIAGNOSIS — Z23 Encounter for immunization: Secondary | ICD-10-CM

## 2020-05-14 NOTE — Progress Notes (Signed)
   Covid-19 Vaccination Clinic  Name:  Joe Cantu    MRN: [id number] DOB: Dec 03, 1950  05/14/2020  [name] was observed post Covid-19 immunization for 15 minutes without incident. He was provided with Vaccine Information Sheet and instruction to access the V-Safe system.   [name] was instructed to call 911 with any severe reactions post vaccine: Marland Kitchen Difficulty breathing  . Swelling of face and throat  . A fast heartbeat  . A bad rash all over body  . Dizziness and weakness

## 2020-06-17 ENCOUNTER — Telehealth: Payer: Self-pay

## 2020-06-17 NOTE — Telephone Encounter (Signed)
Done

## 2020-07-01 ENCOUNTER — Encounter: Payer: Self-pay | Admitting: Cardiology

## 2020-08-09 ENCOUNTER — Other Ambulatory Visit: Payer: Self-pay

## 2020-08-09 MED ORDER — METOPROLOL TARTRATE 25 MG PO TABS: ORAL_TABLET | ORAL | 1 refills | Status: DC

## 2020-09-23 ENCOUNTER — Ambulatory Visit
Admission: EM | Admit: 2020-09-23 | Discharge: 2020-09-23 | Disposition: A | Discharge status: Home / Self Care | Payer: BC Managed Care – PPO | Attending: Emergency Medicine | Admitting: Emergency Medicine

## 2020-09-23 ENCOUNTER — Encounter: Payer: Self-pay | Admitting: Emergency Medicine

## 2020-09-23 ENCOUNTER — Ambulatory Visit (INDEPENDENT_AMBULATORY_CARE_PROVIDER_SITE_OTHER): Payer: BC Managed Care – PPO

## 2020-09-23 ENCOUNTER — Other Ambulatory Visit: Payer: Self-pay

## 2020-09-23 DIAGNOSIS — K5900 Constipation, unspecified: Secondary | ICD-10-CM | POA: Diagnosis not present

## 2020-09-23 MED ORDER — MILK OF MAGNESIA 400 MG/5ML PO SUSP: 15.0000 mL | Freq: Every evening | ORAL | 0 refills | Status: AC | PRN

## 2020-09-23 MED ORDER — DOCUSATE SODIUM 100 MG PO CAPS
100.0000 mg | ORAL_CAPSULE | Freq: Two times a day (BID) | ORAL | 0 refills | Status: DC
Start: 2020-09-23 — End: 2021-02-10

## 2020-09-23 NOTE — ED Provider Notes (Signed)
[name]    CSN: [id number] Arrival date & time: 09/23/20  8756      History   Chief Complaint Chief Complaint  Patient presents with  . Abdominal Pain  . Constipation    HPI Joe Cantu is a 70 y.o. male history of PE, presenting today for evaluation of abdominal pain and constipation.  Patient reports over the past 2 weeks he has noticed small hard stools.  Has become progressively more uncomfortable and bloated in his abdomen.  Denies any vomiting, but has had some mild nausea.  Reports stools approximately 1-2 times per day, the quantity is very small and is very tan.  Reports last colonoscopy 2 years ago and had a couple benign polyps.  Eating and drinking normally.  HPI  Past Medical History:  Diagnosis Date  . Aortic root dilatation (HCC) 02/21/2019  . Dyspnea on exertion   . Frequent unifocal PVCs 02/21/2019  . Glaucoma   . Gonorrhea    18 & 19 yrs of age; negative for syphillis, herpes and HIV  . Left inguinal hernia   . Pneumonia   . Ventricular premature beats   . Wears dentures     Patient Active Problem List   Diagnosis Date Noted  . Presence of IVC filter 01/09/2020  . Shortness of breath   . Other secondary pulmonary hypertension (HCC)   . Transaminitis   . Hemoptysis 09/10/2019  . DVT (deep venous thrombosis) (HCC)   . Syncope and collapse   . Acute massive pulmonary embolism (HCC) 08/30/2019  . Aortic root dilatation (HCC) 02/21/2019  . Frequent unifocal PVCs 02/21/2019  . Palpitations 09/19/2018  . Inguinal hernia without mention of obstruction or gangrene, unilateral or unspecified, (not specified as recurrent)-right 11/24/2012    Past Surgical History:  Procedure Laterality Date  . BRONCHIAL WASHINGS  09/12/2019   Procedure: BRONCHIAL WASHINGS;  Surgeon: Chilton Greathouse, MD;  Location: [facility];  Service: Cardiopulmonary;;  . COLONOSCOPY  07/2018   Benign polyps   . ESOPHAGOGASTRODUODENOSCOPY (EGD) WITH PROPOFOL N/A  09/15/2019   Procedure: ESOPHAGOGASTRODUODENOSCOPY (EGD) WITH PROPOFOL;  Surgeon: Jeani Hawking, MD;  Location: [facility];  Service: Endoscopy;  Laterality: N/A;  . HIATAL HERNIA REPAIR    . IR ANGIOGRAM PULMONARY BILATERAL SELECTIVE  08/30/2019  . IR INFUSION THROMBOL ARTERIAL INITIAL (MS)  08/30/2019  . IR INFUSION THROMBOL ARTERIAL INITIAL (MS)  08/30/2019  . IR IVC FILTER PLMT / S&I /IMG GUID/MOD SED  08/31/2019  . IR THROMB F/U EVAL ART/VEN FINAL DAY (MS)  08/31/2019  . IR US GUIDE VASC ACCESS LEFT  08/30/2019  . IR US GUIDE VASC ACCESS LEFT  08/30/2019  . IVC FILTER REMOVAL N/A 01/09/2020   Procedure: IVC FILTER REMOVAL;  Surgeon: Yates Decamp, MD;  [facility]: [facility];  Service: Cardiovascular;  Laterality: N/A;  . RIGHT HEART CATH N/A 01/09/2020   Procedure: RIGHT HEART CATH;  Surgeon: Yates Decamp, MD;  Location: [facility] CV [facility];  Service: Cardiovascular;  Laterality: N/A;  . VIDEO BRONCHOSCOPY N/A 09/12/2019   Procedure: VIDEO BRONCHOSCOPY WITHOUT FLUORO;  Surgeon: Chilton Greathouse, MD;  Location: [facility];  Service: Cardiopulmonary;  Laterality: N/A;       Home Medications    Prior to Admission medications   Medication Sig Start Date End Date Taking? Authorizing Provider  docusate sodium (COLACE) 100 MG capsule Take 1 capsule (100 mg total) by mouth every 12 (twelve) hours. 09/23/20  Yes Eddye Broxterman C, PA-C  Latanoprostene Bunod 0.024 %  SOLN Place 1 drop into both eyes at bedtime.    Yes [provider]  magnesium hydroxide (MILK OF MAGNESIA) 400 MG/5ML suspension Take 15 mLs by mouth at bedtime as needed for mild constipation. 09/23/20  Yes Glennie Rodda C, PA-C  metoprolol tartrate (LOPRESSOR) 25 MG tablet Take 1/2 (one-half) tablet by mouth twice daily 08/09/20  Yes Yates Decamp, MD  polyethylene glycol (MIRALAX / GLYCOLAX) 17 g packet Take 17 g by mouth daily. Patient taking differently: Take 17 g by mouth daily as needed for mild constipation or moderate  constipation. 09/03/19  Yes Philip Aspen, Limmie Patricia, MD  tamsulosin (FLOMAX) 0.4 MG CAPS capsule Take 0.4 mg by mouth at bedtime. 10/26/18  Yes [provider]  XARELTO 20 MG TABS tablet Take 20 mg by mouth daily with supper.  11/15/19  Yes [provider]  acetaminophen (TYLENOL) 325 MG tablet Take 325 mg by mouth every 6 (six) hours as needed for mild pain or headache.    [provider]  dorzolamide (TRUSOPT) 2 % ophthalmic solution Place 1 drop into both eyes 2 (two) times daily. 08/27/19   [provider]  timolol (TIMOPTIC) 0.5 % ophthalmic solution Place 1 drop into both eyes 2 (two) times daily. 08/27/19   [provider]    Family History Family History  Problem Relation Age of Onset  . Diabetes Mother   . Cancer Father        colon  . Diabetes Sister   . Diabetes Brother     Social History Social History   Tobacco Use  . Smoking status: Former Smoker    Packs/day: 0.50    Years: 10.00    Pack years: 5.00    Types: Cigarettes    Quit date: 1970    Years since quitting: 52.2  . Smokeless tobacco: Never Used  Vaping Use  . Vaping Use: Never used  Substance Use Topics  . Alcohol use: Yes    Comment: occasional wine  . Drug use: No     Allergies   Patient has no known allergies.   Review of Systems Review of Systems  Constitutional: Negative for fatigue and fever.  Eyes: Negative for redness, itching and visual disturbance.  Respiratory: Negative for shortness of breath.   Cardiovascular: Negative for chest pain and leg swelling.  Gastrointestinal: Positive for abdominal pain. Negative for nausea and vomiting.  Musculoskeletal: Negative for arthralgias and myalgias.  Skin: Negative for color change, rash and wound.  Neurological: Negative for dizziness, syncope, weakness, light-headedness and headaches.     Physical Exam Triage Vital Signs ED Triage Vitals  Enc Vitals Group     BP 09/23/20 1035 122/78      Pulse Rate 09/23/20 1035 (!) 58     Resp 09/23/20 1035 20     Temp 09/23/20 1035 97.9 F (36.6 C)     Temp Source 09/23/20 1035 Oral     SpO2 09/23/20 1035 97 %     Weight --      Height --      Head Circumference --      Peak Flow --      Pain Score 09/23/20 1031 5     Pain Loc --      Pain Edu? --      Excl. in GC? --    No data found.  Updated Vital Signs BP 122/78 (BP Location: Left Arm)   Pulse (!) 58   Temp 97.9 F (36.6 C) (  Oral)   Resp 20   SpO2 97%   Visual Acuity Right Eye Distance:   Left Eye Distance:   Bilateral Distance:    Right Eye Near:   Left Eye Near:    Bilateral Near:     Physical Exam Vitals and nursing note reviewed.  Constitutional:      Appearance: He is well-developed.     Comments: No acute distress  HENT:     Head: Normocephalic and atraumatic.     Nose: Nose normal.  Eyes:     Conjunctiva/sclera: Conjunctivae normal.  Cardiovascular:     Rate and Rhythm: Normal rate.  Pulmonary:     Effort: Pulmonary effort is normal. No respiratory distress.  Abdominal:     General: There is no distension.     Comments: Bowel sounds present throughout, soft, nondistended, mild tenderness to palpation throughout, no focal tenderness, negative rebound, negative Rovsing, negative Burney  Musculoskeletal:        General: Normal range of motion.     Cervical back: Neck supple.  Skin:    General: Skin is warm and dry.  Neurological:     Mental Status: He is alert and oriented to person, place, and time.      UC Treatments / Results  Labs (all labs ordered are listed, but only abnormal results are displayed) Labs Reviewed - No data to display  EKG   Radiology DG Abd 2 Views  Result Date: 09/23/2020 CLINICAL DATA:  Constipation for 2 weeks, bloating EXAM: ABDOMEN - 2 VIEW COMPARISON:  None. FINDINGS: Gas throughout nondistended large and small bowel. Moderate stool burden. There is a non obstructive bowel gas pattern. No supine evidence of  free air. No organomegaly or suspicious calcification. No acute bony abnormality. Visualized lung bases clear. IMPRESSION: Moderate stool burden.  No acute findings. Electronically Signed   By: Charlett Nose M.D.   On: 09/23/2020 11:40    Procedures Procedures (including critical care time)  Medications Ordered in UC Medications - No data to display  Initial Impression / Assessment and Plan / UC Course  I have reviewed the triage vital signs and the nursing notes.  Pertinent labs & imaging results that were available during my care of the patient were reviewed by me and considered in my medical decision making (see chart for details).     X-ray negative for obstruction, consistent with constipation, recommending addition of stool softener with daily MiraLAX, discussed other options such as milk of magnesia, magnesium citrate, fleets enema to try, discussed lifestyle modifications as well.  Advised to continue to monitor, encouraged following up with prior gastroenterologist if continuing to have thin stools.  Negative peritoneal signs.  Discussed strict return precautions. Patient verbalized understanding and is agreeable with plan.  Final Clinical Impressions(s) / UC Diagnoses   Final diagnoses:  Constipation, unspecified constipation type     Discharge Instructions     Please use Miralax for moderate to severe constipation. Take this once a day for the next 2-3 days. Please also start docusate stool softener, twice a day for at least 1 week. If stools become loose, cut down to once a day for another week. If stools remain loose, cut back to 1 pill every other day for a third week. You can stop docusate thereafter and resume as needed for constipation.  Milk of magnesia at bedtime as needed  To help reduce constipation and promote bowel health: 1. Drink at least 64 ounces of water each day 2. Eat  plenty of fiber (fruits, vegetables, whole grains, legumes) 3. Be physically active  or exercise including walking, jogging, swimming, yoga, etc. 4. For active constipation use a stool softener (docusate) or an osmotic laxative (like Miralax) each day, or as needed.    ED Prescriptions    Medication Sig Dispense Auth. Provider   docusate sodium (COLACE) 100 MG capsule Take 1 capsule (100 mg total) by mouth every 12 (twelve) hours. 30 capsule Delontae Lamm C, PA-C   magnesium hydroxide (MILK OF MAGNESIA) 400 MG/5ML suspension Take 15 mLs by mouth at bedtime as needed for mild constipation. 355 mL Nnenna Meador, [place] [facility] [place] C, PA-C     PDMP not reviewed this encounter.   Lew DawesWieters, Alexx Mcburney C, PA-C 09/23/20 1216

## 2020-09-23 NOTE — Discharge Instructions (Signed)
Please use Miralax for moderate to severe constipation. Take this once a day for the next 2-3 days. Please also start docusate stool softener, twice a day for at least 1 week. If stools become loose, cut down to once a day for another week. If stools remain loose, cut back to 1 pill every other day for a third week. You can stop docusate thereafter and resume as needed for constipation.  Milk of magnesia at bedtime as needed  To help reduce constipation and promote bowel health: 1. Drink at least 64 ounces of water each day 2. Eat plenty of fiber (fruits, vegetables, whole grains, legumes) 3. Be physically active or exercise including walking, jogging, swimming, yoga, etc. 4. For active constipation use a stool softener (docusate) or an osmotic laxative (like Miralax) each day, or as needed.

## 2020-09-23 NOTE — ED Triage Notes (Signed)
Reports that over the past 2 weeks, he has noticed his stool being hard and small.  Reports abdomen is unncomfortable, bloated"  Mild nausea, no vomiting.  Denies urinary symptoms.

## 2020-09-26 ENCOUNTER — Inpatient Hospital Stay: Payer: BC Managed Care – PPO | Attending: Oncology

## 2020-09-26 ENCOUNTER — Other Ambulatory Visit: Payer: Self-pay

## 2020-09-26 DIAGNOSIS — I2699 Other pulmonary embolism without acute cor pulmonale: Secondary | ICD-10-CM | POA: Diagnosis present

## 2020-09-26 DIAGNOSIS — R042 Hemoptysis: Secondary | ICD-10-CM

## 2020-09-26 DIAGNOSIS — I82401 Acute embolism and thrombosis of unspecified deep veins of right lower extremity: Secondary | ICD-10-CM

## 2020-09-26 LAB — CBC WITH DIFFERENTIAL/PLATELET
Abs Immature Granulocytes: 0.02 10*3/uL (ref 0.00–0.07)
Basophils Absolute: 0 10*3/uL (ref 0.0–0.1)
Basophils Relative: 0 %
Eosinophils Absolute: 0 10*3/uL (ref 0.0–0.5)
Eosinophils Relative: 1 %
HCT: 40.6 % (ref 39.0–52.0)
Hemoglobin: 14.2 g/dL (ref 13.0–17.0)
Immature Granulocytes: 0 %
Lymphocytes Relative: 32 %
Lymphs Abs: 1.8 10*3/uL (ref 0.7–4.0)
MCH: 31.8 pg (ref 26.0–34.0)
MCHC: 35 g/dL (ref 30.0–36.0)
MCV: 91 fL (ref 80.0–100.0)
Monocytes Absolute: 0.5 10*3/uL (ref 0.1–1.0)
Monocytes Relative: 9 %
Neutro Abs: 3.2 10*3/uL (ref 1.7–7.7)
Neutrophils Relative %: 58 %
Platelets: 239 10*3/uL (ref 150–400)
RBC: 4.46 MIL/uL (ref 4.22–5.81)
RDW: 12.4 % (ref 11.5–15.5)
WBC: 5.5 10*3/uL (ref 4.0–10.5)
nRBC: 0 % (ref 0.0–0.2)

## 2020-09-26 LAB — COMPREHENSIVE METABOLIC PANEL
ALT: 16 U/L (ref 0–44)
AST: 19 U/L (ref 15–41)
Albumin: 4 g/dL (ref 3.5–5.0)
Alkaline Phosphatase: 58 U/L (ref 38–126)
Anion gap: 6 (ref 5–15)
BUN: 11 mg/dL (ref 8–23)
CO2: 23 mmol/L (ref 22–32)
Calcium: 10.6 mg/dL — ABNORMAL HIGH (ref 8.9–10.3)
Chloride: 109 mmol/L (ref 98–111)
Creatinine, Ser: 0.8 mg/dL (ref 0.61–1.24)
GFR, Estimated: >60 mL/min (ref >60)
Glucose, Bld: 92 mg/dL (ref 70–99)
Potassium: 4.3 mmol/L (ref 3.5–5.1)
Sodium: 138 mmol/L (ref 135–145)
Total Bilirubin: 0.9 mg/dL (ref 0.3–1.2)
Total Protein: 7.7 g/dL (ref 6.5–8.1)

## 2020-10-02 ENCOUNTER — Emergency Department (HOSPITAL_COMMUNITY): Payer: BC Managed Care – PPO

## 2020-10-02 ENCOUNTER — Encounter (HOSPITAL_COMMUNITY): Payer: Self-pay

## 2020-10-02 ENCOUNTER — Other Ambulatory Visit: Payer: Self-pay

## 2020-10-02 ENCOUNTER — Emergency Department (HOSPITAL_COMMUNITY)
Admission: EM | Admit: 2020-10-02 | Discharge: 2020-10-02 | Disposition: A | Discharge status: Home / Self Care | Payer: BC Managed Care – PPO | Attending: Emergency Medicine | Admitting: Emergency Medicine

## 2020-10-02 DIAGNOSIS — Z87891 Personal history of nicotine dependence: Secondary | ICD-10-CM | POA: Diagnosis not present

## 2020-10-02 DIAGNOSIS — D739 Disease of spleen, unspecified: Secondary | ICD-10-CM | POA: Diagnosis not present

## 2020-10-02 DIAGNOSIS — R109 Unspecified abdominal pain: Secondary | ICD-10-CM | POA: Diagnosis present

## 2020-10-02 DIAGNOSIS — Z7901 Long term (current) use of anticoagulants: Secondary | ICD-10-CM | POA: Insufficient documentation

## 2020-10-02 DIAGNOSIS — D7389 Other diseases of spleen: Secondary | ICD-10-CM

## 2020-10-02 DIAGNOSIS — K5900 Constipation, unspecified: Secondary | ICD-10-CM | POA: Diagnosis not present

## 2020-10-02 LAB — COMPREHENSIVE METABOLIC PANEL
ALT: 16 U/L (ref 0–44)
AST: 19 U/L (ref 15–41)
Albumin: 3.8 g/dL (ref 3.5–5.0)
Alkaline Phosphatase: 47 U/L (ref 38–126)
Anion gap: 6 (ref 5–15)
BUN: 10 mg/dL (ref 8–23)
CO2: 21 mmol/L — ABNORMAL LOW (ref 22–32)
Calcium: 10.7 mg/dL — ABNORMAL HIGH (ref 8.9–10.3)
Chloride: 109 mmol/L (ref 98–111)
Creatinine, Ser: 0.71 mg/dL (ref 0.61–1.24)
GFR, Estimated: >60 mL/min (ref >60)
Glucose, Bld: 106 mg/dL — ABNORMAL HIGH (ref 70–99)
Potassium: 3.7 mmol/L (ref 3.5–5.1)
Sodium: 136 mmol/L (ref 135–145)
Total Bilirubin: 1.4 mg/dL — ABNORMAL HIGH (ref 0.3–1.2)
Total Protein: 7.1 g/dL (ref 6.5–8.1)

## 2020-10-02 LAB — URINALYSIS, ROUTINE W REFLEX MICROSCOPIC
Bilirubin Urine: NEGATIVE
Glucose, UA: NEGATIVE mg/dL
Hgb urine dipstick: NEGATIVE
Ketones, ur: NEGATIVE mg/dL
Leukocytes,Ua: NEGATIVE
Nitrite: NEGATIVE
Protein, ur: NEGATIVE mg/dL
Specific Gravity, Urine: 1.005 (ref 1.005–1.030)
pH: 7 (ref 5.0–8.0)

## 2020-10-02 LAB — CBC
HCT: 39.8 % (ref 39.0–52.0)
Hemoglobin: 14.3 g/dL (ref 13.0–17.0)
MCH: 32.9 pg (ref 26.0–34.0)
MCHC: 35.9 g/dL (ref 30.0–36.0)
MCV: 91.5 fL (ref 80.0–100.0)
Platelets: 257 10*3/uL (ref 150–400)
RBC: 4.35 MIL/uL (ref 4.22–5.81)
RDW: 12.3 % (ref 11.5–15.5)
WBC: 5.4 10*3/uL (ref 4.0–10.5)
nRBC: 0 % (ref 0.0–0.2)

## 2020-10-02 LAB — POC OCCULT BLOOD, ED: Fecal Occult Bld: NEGATIVE

## 2020-10-02 LAB — LIPASE, BLOOD: Lipase: 35 U/L (ref 11–51)

## 2020-10-02 MED ORDER — IOHEXOL 300 MG/ML  SOLN
100.0000 mL | Freq: Once | INTRAMUSCULAR | Status: AC | PRN
  Administered 2020-10-02: 100 mL via INTRAVENOUS

## 2020-10-02 MED ORDER — SORBITOL 70 % SOLN: 960.0000 mL | TOPICAL_OIL | Freq: Once | ORAL | Status: DC

## 2020-10-02 NOTE — ED Triage Notes (Signed)
Patient complains of constipation x 2 days. States he has abdominal cramping with same, taking laxative with no relief.

## 2020-10-02 NOTE — ED Provider Notes (Signed)
[facility] EMERGENCY DEPARTMENT Provider Note   CSN: [id number] Arrival date & time: 10/02/20  4098     History No chief complaint on file.   Joe Cantu is a 70 y.o. male.  HPI Patient reports he has had increasing abdominal pain for several days.  Pain is central and lower.  Aching and cramping in quality.  He reports increasing difficulty passing stool.  He was seen at urgent care and given instructions for using MiraLAX and over-the-counter stool softeners.  He reports he is occasionally passing stool that is thin like a pencil and pale in appearance.  He reports has had about 20 pound weight loss in the past several months.  He denies pain with eating but reports he has had loss of appetite.  He has not seen any blood in his stool.  Reports his last colonoscopy was about 5 or so years ago.  Reports some polyps were removed but he was not told he had any cancer.  Patient is chronically on Xarelto for chronic DVT with pulmonary embolus.  He reports he chronically feels slightly short of breath which he attributes to his chronic PE.  He denies that the shortness of breath has gotten any worse.  He has not had worsening chest pain,  he reports he urinates without difficulty.    Past Medical History:  Diagnosis Date  . Aortic root dilatation (HCC) 02/21/2019  . Dyspnea on exertion   . Frequent unifocal PVCs 02/21/2019  . Glaucoma   . Gonorrhea    18 & 19 yrs of age; negative for syphillis, herpes and HIV  . Left inguinal hernia   . Pneumonia   . Ventricular premature beats   . Wears dentures     Patient Active Problem List   Diagnosis Date Noted  . Presence of IVC filter 01/09/2020  . Shortness of breath   . Other secondary pulmonary hypertension (HCC)   . Transaminitis   . Hemoptysis 09/10/2019  . DVT (deep venous thrombosis) (HCC)   . Syncope and collapse   . Acute massive pulmonary embolism (HCC) 08/30/2019  . Aortic root dilatation (HCC) 02/21/2019  .  Frequent unifocal PVCs 02/21/2019  . Palpitations 09/19/2018  . Inguinal hernia without mention of obstruction or gangrene, unilateral or unspecified, (not specified as recurrent)-right 11/24/2012    Past Surgical History:  Procedure Laterality Date  . BRONCHIAL WASHINGS  09/12/2019   Procedure: BRONCHIAL WASHINGS;  Surgeon: Chilton Greathouse, MD;  Location: [facility];  Service: Cardiopulmonary;;  . COLONOSCOPY  07/2018   Benign polyps   . ESOPHAGOGASTRODUODENOSCOPY (EGD) WITH PROPOFOL N/A 09/15/2019   Procedure: ESOPHAGOGASTRODUODENOSCOPY (EGD) WITH PROPOFOL;  Surgeon: Jeani Hawking, MD;  Location: [facility];  Service: Endoscopy;  Laterality: N/A;  . HIATAL HERNIA REPAIR    . IR ANGIOGRAM PULMONARY BILATERAL SELECTIVE  08/30/2019  . IR INFUSION THROMBOL ARTERIAL INITIAL (MS)  08/30/2019  . IR INFUSION THROMBOL ARTERIAL INITIAL (MS)  08/30/2019  . IR IVC FILTER PLMT / S&I /IMG GUID/MOD SED  08/31/2019  . IR THROMB F/U EVAL ART/VEN FINAL DAY (MS)  08/31/2019  . IR US GUIDE VASC ACCESS LEFT  08/30/2019  . IR US GUIDE VASC ACCESS LEFT  08/30/2019  . IVC FILTER REMOVAL N/A 01/09/2020   Procedure: IVC FILTER REMOVAL;  Surgeon: Yates Decamp, MD;  Location: [facility] INVASIVE CV LAB;  Service: Cardiovascular;  Laterality: N/A;  . RIGHT HEART CATH N/A 01/09/2020   Procedure: RIGHT HEART CATH;  Surgeon: Yates Decamp, MD;  Location: [facility] INVAS[facility] CV LAB;  Service: Cardiovascular;  Laterality: N/A;  . VIDEO BRONCHOSCOPY N/A 09/12/2019   Procedure: VIDEO BRONCHOSCOPY WITHOUT FLUORO;  Surgeon: Chilton Greathouse, MD;  Location: [facility];  Service: Cardiopulmonary;  Laterality: N/A;       Family History  Problem Relation Age of Onset  . Diabetes Mother   . Cancer Father        colon  . Diabetes Sister   . Diabetes Brother     Social History   Tobacco Use  . Smoking status: Former Smoker    Packs/day: 0.50    Years: 10.00    Pack years: 5.00    Types: Cigarettes    Quit date: 1970    Years since quitting:  52.2  . Smokeless tobacco: Never Used  Vaping Use  . Vaping Use: Never used  Substance Use Topics  . Alcohol use: Yes    Comment: occasional wine  . Drug use: No    Home Medications Prior to Admission medications   Medication Sig Start Date End Date Taking? Authorizing Provider  acetaminophen (TYLENOL) 325 MG tablet Take 325 mg by mouth every 6 (six) hours as needed for mild pain or headache.    [provider]  docusate sodium (COLACE) 100 MG capsule Take 1 capsule (100 mg total) by mouth every 12 (twelve) hours. 09/23/20   Wieters, Hallie C, PA-C  dorzolamide (TRUSOPT) 2 % ophthalmic solution Place 1 drop into both eyes 2 (two) times daily. 08/27/19   [provider]  Latanoprostene Bunod 0.024 % SOLN Place 1 drop into both eyes at bedtime.     [provider]  magnesium hydroxide (MILK OF MAGNESIA) 400 MG/5ML suspension Take 15 mLs by mouth at bedtime as needed for mild constipation. 09/23/20   Wieters, Hallie C, PA-C  metoprolol tartrate (LOPRESSOR) 25 MG tablet Take 1/2 (one-half) tablet by mouth twice daily 08/09/20   Yates Decamp, MD  polyethylene glycol (MIRALAX / GLYCOLAX) 17 g packet Take 17 g by mouth daily. Patient taking differently: Take 17 g by mouth daily as needed for mild constipation or moderate constipation. 09/03/19   Philip Aspen, Limmie Patricia, MD  tamsulosin (FLOMAX) 0.4 MG CAPS capsule Take 0.4 mg by mouth at bedtime. 10/26/18   [provider]  timolol (TIMOPTIC) 0.5 % ophthalmic solution Place 1 drop into both eyes 2 (two) times daily. 08/27/19   [provider]  XARELTO 20 MG TABS tablet Take 20 mg by mouth daily with supper.  11/15/19   [provider]    Allergies    Patient has no known allergies.  Review of Systems   Review of Systems 10 systems reviewed and negative except per HPI Physical Exam Updated Vital Signs BP 137/75 (BP Location: Right Arm)   Pulse (!) 55   Temp 98.7 F (37.1 C) (Oral)   Resp 16    SpO2 100%   Physical Exam Constitutional:      Comments: Patient is alert and nontoxic.  He is tall and thin in appearance.  No respiratory distress.  Clear mental status.  HENT:     Mouth/Throat:     Pharynx: Oropharynx is clear.  Eyes:     Extraocular Movements: Extraocular movements intact.     Conjunctiva/sclera: Conjunctivae normal.  Cardiovascular:     Rate and Rhythm: Normal rate and regular rhythm.  Pulmonary:     Effort: Pulmonary effort is normal.     Breath sounds: Normal breath sounds.  Abdominal:  Comments: Abdomen soft no guarding.  Moderate suprapubic and left lower quadrant pain to palpation.  No CVA tenderness.  Genitourinary:    Comments: Rectal exam trace yellow-brown stool in the vault.  No formed stool in the vault.  No visible blood.  Prostate nontender without significant enlargement or nodules.  No palpable mass in the rectal vault. Musculoskeletal:        General: Normal range of motion.     Comments: 1+ edema left lower extremity.  Right lower extremity no significant edema  Skin:    General: Skin is warm and dry.  Neurological:     General: No focal deficit present.     Mental Status: He is oriented to person, place, and time.     Motor: No weakness.     Coordination: Coordination normal.  Psychiatric:        Mood and Affect: Mood normal.     ED Results / Procedures / Treatments   Labs (all labs ordered are listed, but only abnormal results are displayed) Labs Reviewed  COMPREHENSIVE METABOLIC PANEL - Abnormal; Notable for the following components:      Result Value   CO2 21 (*)    Glucose, Bld 106 (*)    Calcium 10.7 (*)    Total Bilirubin 1.4 (*)    All other components within normal limits  URINALYSIS, ROUTINE W REFLEX MICROSCOPIC - Abnormal; Notable for the following components:   Color, Urine STRAW (*)    All other components within normal limits  LIPASE, BLOOD  CBC    EKG None  Radiology No results  found.  Procedures Procedures   Medications Ordered in ED Medications - No data to display  ED Course  I have reviewed the triage vital signs and the nursing notes.  Pertinent labs & imaging results that were available during my care of the patient were reviewed by me and considered in my medical decision making (see chart for details).    MDM Rules/Calculators/A&P                          Patient has several days of increasing generalized abdominal pain with difficulty passing stool.  No impaction in the rectal vault.  Patient reports unintentional weight loss.  He is concerned for possible cancer or other serious problem.  Patient neurovascularly intact.  We will proceed with CT scan.  Pending CT scan result anticipate if no acute findings, patient will be stable for discharge with appropriate follow-up.  Dr. Particia Nearing to review CT scans for final disposition. Final Clinical Impression(s) / ED Diagnoses Final diagnoses:  Constipation, unspecified constipation type  Splenic lesion    Rx / DC Orders ED Discharge Orders    None       Arby Barrette, MD 10/03/20 [phone]

## 2020-10-02 NOTE — Discharge Instructions (Addendum)
OTC Fleet's enema as needed for constipation.  Continue colace and miralax.  Eat a high fiber diet and drink enough water.  The radiologist recommends an outpatient MRI of your abdomen to further evaluate the lesion on your spleen.  I have ordered this test.

## 2020-10-02 NOTE — ED Provider Notes (Signed)
Pt signed out by Dr. Donnald Garre pending CT abd/pelvis.  IMPRESSION:  1. No acute abnormality. No evidence of bowel obstruction or acute  bowel inflammation. Moderate diffuse colonic stool volume,  suggesting constipation.  2. Hyperdense 2.3 cm upper splenic lesion, indeterminate. MRI  abdomen without and with IV contrast indicated for further  evaluation.  3. Mild prostatomegaly.  4. Aortic Atherosclerosis (ICD10-I70.0).     He did have a colonoscopy with Dr. Elnoria Howard in 2020 which was benign.  He does have a lesion on the spleen.  Radiology recommended a MRI, so I ordered this test.    Pt offered to get an enema in the ED, but he'd rather do it at home.  He knows to return if worse.  F/u with pcp and with Dr. Elnoria Howard.   Jacalyn Lefevre, MD 10/02/20 [phone]

## 2020-10-14 ENCOUNTER — Other Ambulatory Visit: Payer: Self-pay | Admitting: Gastroenterology

## 2020-10-14 DIAGNOSIS — R935 Abnormal findings on diagnostic imaging of other abdominal regions, including retroperitoneum: Secondary | ICD-10-CM

## 2020-10-14 DIAGNOSIS — D7389 Other diseases of spleen: Secondary | ICD-10-CM

## 2020-10-29 ENCOUNTER — Ambulatory Visit
Admission: RE | Admit: 2020-10-29 | Discharge: 2020-10-29 | Disposition: A | Discharge status: Home / Self Care | Payer: BC Managed Care – PPO | Source: Ambulatory Visit | Attending: Gastroenterology | Admitting: Gastroenterology

## 2020-10-29 DIAGNOSIS — R935 Abnormal findings on diagnostic imaging of other abdominal regions, including retroperitoneum: Secondary | ICD-10-CM

## 2020-10-29 DIAGNOSIS — D7389 Other diseases of spleen: Secondary | ICD-10-CM

## 2020-10-29 MED ORDER — GADOBENATE DIMEGLUMINE 529 MG/ML IV SOLN
14.0000 mL | Freq: Once | INTRAVENOUS | Status: AC | PRN
  Administered 2020-10-29: 14 mL via INTRAVENOUS

## 2020-11-06 ENCOUNTER — Other Ambulatory Visit: Payer: Self-pay

## 2020-11-06 MED ORDER — XARELTO 20 MG PO TABS: 20.0000 mg | ORAL_TABLET | Freq: Every day | ORAL | 1 refills | Status: DC

## 2020-11-25 DIAGNOSIS — H2512 Age-related nuclear cataract, left eye: Secondary | ICD-10-CM | POA: Diagnosis not present

## 2020-11-25 DIAGNOSIS — H401130 Primary open-angle glaucoma, bilateral, stage unspecified: Secondary | ICD-10-CM | POA: Diagnosis not present

## 2020-12-02 DIAGNOSIS — I2602 Saddle embolus of pulmonary artery with acute cor pulmonale: Secondary | ICD-10-CM | POA: Diagnosis not present

## 2020-12-02 DIAGNOSIS — Z7901 Long term (current) use of anticoagulants: Secondary | ICD-10-CM | POA: Diagnosis not present

## 2020-12-02 DIAGNOSIS — R0602 Shortness of breath: Secondary | ICD-10-CM | POA: Diagnosis not present

## 2020-12-02 DIAGNOSIS — I82503 Chronic embolism and thrombosis of unspecified deep veins of lower extremity, bilateral: Secondary | ICD-10-CM | POA: Diagnosis not present

## 2020-12-02 DIAGNOSIS — I719 Aortic aneurysm of unspecified site, without rupture: Secondary | ICD-10-CM | POA: Diagnosis not present

## 2020-12-16 ENCOUNTER — Other Ambulatory Visit: Payer: Self-pay | Admitting: *Deleted

## 2020-12-16 ENCOUNTER — Telehealth: Payer: Self-pay | Admitting: *Deleted

## 2020-12-16 DIAGNOSIS — I2699 Other pulmonary embolism without acute cor pulmonale: Secondary | ICD-10-CM

## 2020-12-16 DIAGNOSIS — I82402 Acute embolism and thrombosis of unspecified deep veins of left lower extremity: Secondary | ICD-10-CM

## 2020-12-16 DIAGNOSIS — R071 Chest pain on breathing: Secondary | ICD-10-CM

## 2020-12-16 DIAGNOSIS — Z7901 Long term (current) use of anticoagulants: Secondary | ICD-10-CM

## 2020-12-16 NOTE — Telephone Encounter (Signed)
Pt called to this RN to report " having some pain in my left calf like when I was first diagnosed."  Pt has history of LLE DVT and PE - now on xeralto.  He notes mild swelling, no redness, but having some increased fatigue.  He states he has not missed any xeralto doses.  He has not had any increased activity or positioning to provoke the pain.  He states he walks some " and probably sitting more then I normally do "  This RN informed him above would be reviewed with covering provider for recommendations.

## 2020-12-17 ENCOUNTER — Telehealth: Payer: Self-pay | Admitting: *Deleted

## 2020-12-17 ENCOUNTER — Ambulatory Visit (HOSPITAL_BASED_OUTPATIENT_CLINIC_OR_DEPARTMENT_OTHER)
Admission: RE | Admit: 2020-12-17 | Discharge: 2020-12-17 | Disposition: A | Discharge status: Home / Self Care | Payer: Medicare Other | Source: Ambulatory Visit | Attending: Hematology and Oncology | Admitting: Hematology and Oncology

## 2020-12-17 ENCOUNTER — Other Ambulatory Visit: Payer: Self-pay

## 2020-12-17 ENCOUNTER — Ambulatory Visit (HOSPITAL_COMMUNITY)
Admission: RE | Admit: 2020-12-17 | Discharge: 2020-12-17 | Disposition: A | Discharge status: Home / Self Care | Payer: Medicare Other | Source: Ambulatory Visit | Attending: Hematology and Oncology | Admitting: Hematology and Oncology

## 2020-12-17 ENCOUNTER — Inpatient Hospital Stay: Payer: Medicare Other | Attending: Oncology

## 2020-12-17 ENCOUNTER — Encounter (HOSPITAL_BASED_OUTPATIENT_CLINIC_OR_DEPARTMENT_OTHER): Payer: Self-pay

## 2020-12-17 DIAGNOSIS — I2699 Other pulmonary embolism without acute cor pulmonale: Secondary | ICD-10-CM | POA: Insufficient documentation

## 2020-12-17 DIAGNOSIS — Z7901 Long term (current) use of anticoagulants: Secondary | ICD-10-CM | POA: Diagnosis not present

## 2020-12-17 DIAGNOSIS — I82402 Acute embolism and thrombosis of unspecified deep veins of left lower extremity: Secondary | ICD-10-CM | POA: Insufficient documentation

## 2020-12-17 DIAGNOSIS — R071 Chest pain on breathing: Secondary | ICD-10-CM | POA: Insufficient documentation

## 2020-12-17 DIAGNOSIS — R911 Solitary pulmonary nodule: Secondary | ICD-10-CM | POA: Diagnosis not present

## 2020-12-17 LAB — CBC WITH DIFFERENTIAL (CANCER CENTER ONLY)
Abs Immature Granulocytes: 0.02 10*3/uL (ref 0.00–0.07)
Basophils Absolute: 0 10*3/uL (ref 0.0–0.1)
Basophils Relative: 0 %
Eosinophils Absolute: 0 10*3/uL (ref 0.0–0.5)
Eosinophils Relative: 1 %
HCT: 39.8 % (ref 39.0–52.0)
Hemoglobin: 14 g/dL (ref 13.0–17.0)
Immature Granulocytes: 1 %
Lymphocytes Relative: 26 %
Lymphs Abs: 1.1 10*3/uL (ref 0.7–4.0)
MCH: 32.6 pg (ref 26.0–34.0)
MCHC: 35.2 g/dL (ref 30.0–36.0)
MCV: 92.6 fL (ref 80.0–100.0)
Monocytes Absolute: 0.4 10*3/uL (ref 0.1–1.0)
Monocytes Relative: 9 %
Neutro Abs: 2.8 10*3/uL (ref 1.7–7.7)
Neutrophils Relative %: 63 %
Platelet Count: 233 10*3/uL (ref 150–400)
RBC: 4.3 MIL/uL (ref 4.22–5.81)
RDW: 12.5 % (ref 11.5–15.5)
WBC Count: 4.4 10*3/uL (ref 4.0–10.5)
nRBC: 0 % (ref 0.0–0.2)

## 2020-12-17 LAB — CMP (CANCER CENTER ONLY)
ALT: 19 U/L (ref 0–44)
AST: 19 U/L (ref 15–41)
Albumin: 3.6 g/dL (ref 3.5–5.0)
Alkaline Phosphatase: 60 U/L (ref 38–126)
Anion gap: 7 (ref 5–15)
BUN: 14 mg/dL (ref 8–23)
CO2: 22 mmol/L (ref 22–32)
Calcium: 10.3 mg/dL (ref 8.9–10.3)
Chloride: 109 mmol/L (ref 98–111)
Creatinine: 0.78 mg/dL (ref 0.61–1.24)
GFR, Estimated: >60 mL/min (ref >60)
Glucose, Bld: 90 mg/dL (ref 70–99)
Potassium: 4 mmol/L (ref 3.5–5.1)
Sodium: 138 mmol/L (ref 135–145)
Total Bilirubin: 0.7 mg/dL (ref 0.3–1.2)
Total Protein: 7.2 g/dL (ref 6.5–8.1)

## 2020-12-17 MED ORDER — IOHEXOL 350 MG/ML SOLN
60.0000 mL | Freq: Once | INTRAVENOUS | Status: AC | PRN
  Administered 2020-12-17: 60 mL via INTRAVENOUS

## 2020-12-17 NOTE — Telephone Encounter (Signed)
This RN spoke with pt per results of LE dopplers and CT of chest .  Reviewed no new findings- lungs are improving- dopplers show chronic issues but again nothing acute. Verified per MD review to continue on current blood thinner as prescribed.  Discussed issues relating to pain and shortness of breath post his diagnosis and need to increase endurance.  This RN discussed possible referral to PT for above - though Joe Cantu would prefer to do this on his own.  This RN reviewed with him how to slowly increase his endurance so he does not overwork his body which may cause a relapse of fatigue.  Questions answered as well as Joe Cantu verbalized understanding to call if needed.

## 2020-12-17 NOTE — Progress Notes (Signed)
Bilateral lower extremity venous duplex has been completed. Preliminary results can be found in CV Proc through chart review.  Results were given to Dr. Pamelia Hoit.  12/17/20 9:52 AM Olen Cordial RVT

## 2020-12-24 DIAGNOSIS — H2512 Age-related nuclear cataract, left eye: Secondary | ICD-10-CM | POA: Diagnosis not present

## 2020-12-24 DIAGNOSIS — Z961 Presence of intraocular lens: Secondary | ICD-10-CM | POA: Diagnosis not present

## 2020-12-24 DIAGNOSIS — I719 Aortic aneurysm of unspecified site, without rupture: Secondary | ICD-10-CM | POA: Diagnosis not present

## 2020-12-24 DIAGNOSIS — Z7901 Long term (current) use of anticoagulants: Secondary | ICD-10-CM | POA: Diagnosis not present

## 2020-12-24 DIAGNOSIS — H401131 Primary open-angle glaucoma, bilateral, mild stage: Secondary | ICD-10-CM | POA: Diagnosis not present

## 2020-12-24 DIAGNOSIS — I2602 Saddle embolus of pulmonary artery with acute cor pulmonale: Secondary | ICD-10-CM | POA: Diagnosis not present

## 2020-12-24 DIAGNOSIS — I82503 Chronic embolism and thrombosis of unspecified deep veins of lower extremity, bilateral: Secondary | ICD-10-CM | POA: Diagnosis not present

## 2020-12-24 DIAGNOSIS — R0602 Shortness of breath: Secondary | ICD-10-CM | POA: Diagnosis not present

## 2020-12-24 DIAGNOSIS — H18413 Arcus senilis, bilateral: Secondary | ICD-10-CM | POA: Diagnosis not present

## 2020-12-31 DIAGNOSIS — H52223 Regular astigmatism, bilateral: Secondary | ICD-10-CM | POA: Diagnosis not present

## 2020-12-31 DIAGNOSIS — H524 Presbyopia: Secondary | ICD-10-CM | POA: Diagnosis not present

## 2020-12-31 DIAGNOSIS — H16143 Punctate keratitis, bilateral: Secondary | ICD-10-CM | POA: Diagnosis not present

## 2021-01-16 ENCOUNTER — Emergency Department (HOSPITAL_COMMUNITY): Payer: Medicare Other

## 2021-01-16 ENCOUNTER — Other Ambulatory Visit: Payer: Self-pay

## 2021-01-16 ENCOUNTER — Encounter (HOSPITAL_COMMUNITY): Payer: Self-pay | Admitting: Emergency Medicine

## 2021-01-16 ENCOUNTER — Emergency Department (HOSPITAL_COMMUNITY)
Admission: EM | Admit: 2021-01-16 | Discharge: 2021-01-16 | Disposition: A | Discharge status: Home / Self Care | Payer: Medicare Other | Attending: Emergency Medicine | Admitting: Emergency Medicine

## 2021-01-16 DIAGNOSIS — Z87891 Personal history of nicotine dependence: Secondary | ICD-10-CM | POA: Diagnosis not present

## 2021-01-16 DIAGNOSIS — W01198A Fall on same level from slipping, tripping and stumbling with subsequent striking against other object, initial encounter: Secondary | ICD-10-CM | POA: Diagnosis not present

## 2021-01-16 DIAGNOSIS — Y92810 Car as the place of occurrence of the external cause: Secondary | ICD-10-CM | POA: Insufficient documentation

## 2021-01-16 DIAGNOSIS — J984 Other disorders of lung: Secondary | ICD-10-CM | POA: Diagnosis not present

## 2021-01-16 DIAGNOSIS — Y990 Civilian activity done for income or pay: Secondary | ICD-10-CM | POA: Diagnosis not present

## 2021-01-16 DIAGNOSIS — Y93E9 Activity, other interior property and clothing maintenance: Secondary | ICD-10-CM | POA: Diagnosis not present

## 2021-01-16 DIAGNOSIS — Z7901 Long term (current) use of anticoagulants: Secondary | ICD-10-CM | POA: Insufficient documentation

## 2021-01-16 DIAGNOSIS — S2231XA Fracture of one rib, right side, initial encounter for closed fracture: Secondary | ICD-10-CM | POA: Insufficient documentation

## 2021-01-16 DIAGNOSIS — R404 Transient alteration of awareness: Secondary | ICD-10-CM | POA: Diagnosis not present

## 2021-01-16 DIAGNOSIS — R531 Weakness: Secondary | ICD-10-CM | POA: Diagnosis not present

## 2021-01-16 DIAGNOSIS — R42 Dizziness and giddiness: Secondary | ICD-10-CM | POA: Insufficient documentation

## 2021-01-16 DIAGNOSIS — T671XXA Heat syncope, initial encounter: Secondary | ICD-10-CM | POA: Diagnosis not present

## 2021-01-16 DIAGNOSIS — Z743 Need for continuous supervision: Secondary | ICD-10-CM | POA: Diagnosis not present

## 2021-01-16 DIAGNOSIS — R0781 Pleurodynia: Secondary | ICD-10-CM | POA: Diagnosis not present

## 2021-01-16 DIAGNOSIS — R55 Syncope and collapse: Secondary | ICD-10-CM | POA: Diagnosis not present

## 2021-01-16 DIAGNOSIS — I499 Cardiac arrhythmia, unspecified: Secondary | ICD-10-CM | POA: Diagnosis not present

## 2021-01-16 DIAGNOSIS — R6889 Other general symptoms and signs: Secondary | ICD-10-CM | POA: Diagnosis not present

## 2021-01-16 DIAGNOSIS — S299XXA Unspecified injury of thorax, initial encounter: Secondary | ICD-10-CM | POA: Diagnosis present

## 2021-01-16 LAB — CBC WITH DIFFERENTIAL/PLATELET
Abs Immature Granulocytes: 0.04 10*3/uL (ref 0.00–0.07)
Basophils Absolute: 0 10*3/uL (ref 0.0–0.1)
Basophils Relative: 0 %
Eosinophils Absolute: 0 10*3/uL (ref 0.0–0.5)
Eosinophils Relative: 0 %
HCT: 39.9 % (ref 39.0–52.0)
Hemoglobin: 13.7 g/dL (ref 13.0–17.0)
Immature Granulocytes: 1 %
Lymphocytes Relative: 16 %
Lymphs Abs: 1.1 10*3/uL (ref 0.7–4.0)
MCH: 32.3 pg (ref 26.0–34.0)
MCHC: 34.3 g/dL (ref 30.0–36.0)
MCV: 94.1 fL (ref 80.0–100.0)
Monocytes Absolute: 0.6 10*3/uL (ref 0.1–1.0)
Monocytes Relative: 8 %
Neutro Abs: 5.2 10*3/uL (ref 1.7–7.7)
Neutrophils Relative %: 75 %
Platelets: 224 10*3/uL (ref 150–400)
RBC: 4.24 MIL/uL (ref 4.22–5.81)
RDW: 12.7 % (ref 11.5–15.5)
WBC: 6.9 10*3/uL (ref 4.0–10.5)
nRBC: 0 % (ref 0.0–0.2)

## 2021-01-16 LAB — CK: Total CK: 76 U/L (ref 49–397)

## 2021-01-16 LAB — COMPREHENSIVE METABOLIC PANEL
ALT: 22 U/L (ref 0–44)
AST: 21 U/L (ref 15–41)
Albumin: 4 g/dL (ref 3.5–5.0)
Alkaline Phosphatase: 49 U/L (ref 38–126)
Anion gap: 8 (ref 5–15)
BUN: 17 mg/dL (ref 8–23)
CO2: 20 mmol/L — ABNORMAL LOW (ref 22–32)
Calcium: 10.9 mg/dL — ABNORMAL HIGH (ref 8.9–10.3)
Chloride: 114 mmol/L — ABNORMAL HIGH (ref 98–111)
Creatinine, Ser: 0.77 mg/dL (ref 0.61–1.24)
GFR, Estimated: >60 mL/min (ref >60)
Glucose, Bld: 105 mg/dL — ABNORMAL HIGH (ref 70–99)
Potassium: 3.9 mmol/L (ref 3.5–5.1)
Sodium: 142 mmol/L (ref 135–145)
Total Bilirubin: 1.4 mg/dL — ABNORMAL HIGH (ref 0.3–1.2)
Total Protein: 7.1 g/dL (ref 6.5–8.1)

## 2021-01-16 LAB — CBG MONITORING, ED: Glucose-Capillary: 84 mg/dL (ref 70–99)

## 2021-01-16 MED ORDER — SODIUM CHLORIDE 0.9 % IV BOLUS
1000.0000 mL | Freq: Once | INTRAVENOUS | Status: AC
  Administered 2021-01-16: 1000 mL via INTRAVENOUS

## 2021-01-16 MED ORDER — METHYL SALICYLATE-LIDO-MENTHOL 4-4-5 % EX PTCH: 1.0000 | MEDICATED_PATCH | Freq: Two times a day (BID) | CUTANEOUS | 0 refills | Status: DC

## 2021-01-16 MED ORDER — DICLOFENAC EPOLAMINE 1.3 % EX PTCH
1.0000 | MEDICATED_PATCH | Freq: Two times a day (BID) | CUTANEOUS | Status: DC
  Administered 2021-01-16: 1 via TRANSDERMAL
  Filled 2021-01-16: qty 1

## 2021-01-16 MED ORDER — SODIUM CHLORIDE 0.9 % IV SOLN: INTRAVENOUS | Status: DC

## 2021-01-16 NOTE — ED Provider Notes (Signed)
[facility]-EMERGENCY DEPT Provider Note   CSN: [id number] Arrival date & time: 01/16/21  1733     History Chief Complaint  Patient presents with   Weakness    Joe Cantu is a 70 y.o. male.  HPI Patient presents after episode of syncope.  It was witnessed. Patient states that he is generally well, though he does have recent change in dietary intake.  He works in a [employer], educational. He notes that he has been taking his medication regularly including blood thinners, has been in his usual state of health.  Today, the patient had episode of lightheadedness, then syncope.  He was unconscious for about 3 minutes according to bystanders.  He denies chest pain either before or after the event. He fell up on a trailer hitch, and complains of pain in the right lateral infrascapular region, sore, moderate, worse with motion.  No medication taken for relief.  Primus report the patient was hypotensive, with a systolic less than 90 on arrival.  He improved to systolic greater than 110 liter fluid resuscitation.    Past Medical History:  Diagnosis Date   Aortic root dilatation (HCC) 02/21/2019   Dyspnea on exertion    Frequent unifocal PVCs 02/21/2019   Glaucoma    Gonorrhea    18 & 19 yrs of age; negative for syphillis, herpes and HIV   Left inguinal hernia    Pneumonia    Ventricular premature beats    Wears dentures     Patient Active Problem List   Diagnosis Date Noted   Presence of IVC filter 01/09/2020   Shortness of breath    Other secondary pulmonary hypertension (HCC)    Transaminitis    Hemoptysis 09/10/2019   DVT (deep venous thrombosis) (HCC)    Syncope and collapse    Acute massive pulmonary embolism (HCC) 08/30/2019   Aortic root dilatation (HCC) 02/21/2019   Frequent unifocal PVCs 02/21/2019   Palpitations 09/19/2018   Inguinal hernia without mention of obstruction or gangrene, unilateral or unspecified, (not specified as recurrent)-right  11/24/2012    Past Surgical History:  Procedure Laterality Date   BRONCHIAL WASHINGS  09/12/2019   Procedure: BRONCHIAL WASHINGS;  Surgeon: Chilton Greathouse, MD;  Location: [facility];  Service: Cardiopulmonary;;   COLONOSCOPY  07/2018   Benign polyps    ESOPHAGOGASTRODUODENOSCOPY (EGD) WITH PROPOFOL N/A 09/15/2019   Procedure: ESOPHAGOGASTRODUODENOSCOPY (EGD) WITH PROPOFOL;  Surgeon: Jeani Hawking, MD;  Location: [facility];  Service: Endoscopy;  Laterality: N/A;   HIATAL HERNIA REPAIR     IR ANGIOGRAM PULMONARY BILATERAL SELECTIVE  08/30/2019   IR INFUSION THROMBOL ARTERIAL INITIAL (MS)  08/30/2019   IR INFUSION THROMBOL ARTERIAL INITIAL (MS)  08/30/2019   IR IVC FILTER PLMT / S&I /IMG GUID/MOD SED  08/31/2019   IR THROMB F/U EVAL ART/VEN FINAL DAY (MS)  08/31/2019   IR US GUIDE VASC ACCESS LEFT  08/30/2019   IR US GUIDE VASC ACCESS LEFT  08/30/2019   IVC FILTER REMOVAL N/A 01/09/2020   Procedure: IVC FILTER REMOVAL;  Surgeon: Yates Decamp, MD;  Location: [facility]VAS[facility] CV LAB;  Service: Cardiovascular;  Laterality: N/A;   RIGHT HEART CATH N/A 01/09/2020   Procedure: RIGHT HEART CATH;  Surgeon: Yates Decamp, MD;  Location: [facility] INVASIVE CV LAB;  Service: Cardiovascular;  Laterality: N/A;   VIDEO BRONCHOSCOPY N/A 09/12/2019   Procedure: VIDEO BRONCHOSCOPY WITHOUT FLUORO;  Surgeon: Chilton Greathouse, MD;  Location: [facility];  Service: Cardiopulmonary;  Laterality: N/A;  Family History  Problem Relation Age of Onset   Diabetes Mother    Cancer Father        colon   Diabetes Sister    Diabetes Brother     Social History   Tobacco Use   Smoking status: Former    Packs/day: 0.50    Years: 10.00    Pack years: 5.00    Types: Cigarettes    Quit date: 1970    Years since quitting: 52.5   Smokeless tobacco: Never  Vaping Use   Vaping Use: Never used  Substance Use Topics   Alcohol use: Yes    Comment: occasional wine   Drug use: No    Home Medications Prior to Admission  medications   Medication Sig Start Date End Date Taking? Authorizing Provider  acetaminophen (TYLENOL) 325 MG tablet Take 325 mg by mouth every 12 (twelve) hours as needed for mild pain or headache.    [provider]  docusate sodium (COLACE) 100 MG capsule Take 1 capsule (100 mg total) by mouth every 12 (twelve) hours. 09/23/20   Wieters, Hallie C, PA-C  dorzolamide (TRUSOPT) 2 % ophthalmic solution Place 1 drop into both eyes 2 (two) times daily. 08/27/19   [provider]  magnesium hydroxide (MILK OF MAGNESIA) 400 MG/5ML suspension Take 15 mLs by mouth at bedtime as needed for mild constipation. 09/23/20   Wieters, Hallie C, PA-C  metoprolol tartrate (LOPRESSOR) 25 MG tablet Take 1/2 (one-half) tablet by mouth twice daily Patient taking differently: Take 12.5 mg by mouth 2 (two) times daily with a meal. 08/09/20   Yates DecampGanji, Jay, MD  polyethylene glycol (MIRALAX / GLYCOLAX) 17 g packet Take 17 g by mouth daily. 09/03/19   Philip AspenHernandez Acosta, Limmie PatriciaEstela Y, MD  tamsulosin [facility](FLOMAX) 0.4 MG CAPS capsule Take 0.4 mg by mouth daily after breakfast. 10/26/18   [provider]  timolol (TIMOPTIC) 0.5 % ophthalmic solution Place 1 drop into both eyes in the morning and at bedtime. 08/27/19   [provider]  VYZULTA 0.024 % SOLN Place 1 drop into both eyes at bedtime.    [provider]  XARELTO 20 MG TABS tablet Take 1 tablet (20 mg total) by mouth daily with supper. 11/06/20   Yates DecampGanji, Jay, MD    Allergies    Patient has no known allergies.  Review of Systems   Review of Systems  Constitutional:        Per HPI, otherwise negative  HENT:         Per HPI, otherwise negative  Respiratory:         Per HPI, otherwise negative  Cardiovascular:        Per HPI, otherwise negative  Gastrointestinal:  Negative for vomiting.  Endocrine:       Negative aside from HPI  Genitourinary:        Neg aside from HPI   Musculoskeletal:        Per HPI, otherwise negative  Skin:  Negative.   Neurological:  Positive for syncope.   Physical Exam Updated Vital Signs BP 125/71   Pulse (!) 52   Temp 97.9 F (36.6 C) (Oral)   Resp 20   Ht 6\' 3"  (1.905 m)   Wt 70.3 kg   SpO2 99%   BMI 19.37 kg/m   Physical Exam Vitals and nursing note reviewed.  Constitutional:      General: He is not in acute distress.    Appearance: He is well-developed.  HENT:  Head: Normocephalic and atraumatic.  Eyes:     Conjunctiva/sclera: Conjunctivae normal.  Cardiovascular:     Rate and Rhythm: Normal rate and regular rhythm.  Pulmonary:     Effort: Pulmonary effort is normal. No respiratory distress.     Breath sounds: No stridor.  Chest:    Abdominal:     General: There is no distension.  Skin:    General: Skin is warm and dry.  Neurological:     Mental Status: He is alert and oriented to person, place, and time.    ED Results / Procedures / Treatments   Labs (all labs ordered are listed, but only abnormal results are displayed) Labs Reviewed  COMPREHENSIVE METABOLIC PANEL - Abnormal; Notable for the following components:      Result Value   Chloride 114 (*)    CO2 20 (*)    Glucose, Bld 105 (*)    Calcium 10.9 (*)    Total Bilirubin 1.4 (*)    All other components within normal limits  CBC WITH DIFFERENTIAL/PLATELET  CK  CBG MONITORING, ED    EKG EKG Interpretation  Date/Time:  Thursday January 16 2021 18:05:33 EDT Ventricular Rate:  71 PR Interval:  206 QRS Duration: 105 QT Interval:  427 QTC Calculation: 427 R Axis:   34 Text Interpretation: Sinus rhythm Multiple ventricular premature complexes Left ventricular hypertrophy Artifact Abnormal ECG Confirmed by Gerhard Munch [id number]) on 01/16/2021 6:52:13 PM  Radiology DG Ribs Unilateral W/Chest Right  Result Date: 01/16/2021 CLINICAL DATA:  Fall earlier today with T6 level posterior right rib pain EXAM: RIGHT RIBS AND CHEST - 3+ VIEW COMPARISON:  09/13/2019 chest radiograph. FINDINGS: Stable  cardiomediastinal silhouette with normal heart size. No pneumothorax. No pleural effusion. Minimal linear scarring at the right lung base. No pulmonary edema. No acute consolidative airspace disease. The area of symptomatic concern as indicated by the patient in the posterior lower right chest wall was denoted with a metallic skin BB by the technologist. Suggestion of a subtle nondisplaced posterolateral right ninth rib fracture. No additional rib fracture. No focal osseous lesions. IMPRESSION: Suggestion of a subtle nondisplaced posterolateral right ninth rib fracture. No pneumothorax. No active pulmonary disease. Electronically Signed   By: Delbert Phenix M.D.   On: 01/16/2021 18:55    Procedures Procedures   Medications Ordered in ED Medications  sodium chloride 0.9 % bolus 1,000 mL (has no administration in time range)    And  0.9 %  sodium chloride infusion (has no administration in time range)    ED Course  I have reviewed the triage vital signs and the nursing notes.  Pertinent labs & imaging results that were available during my care of the patient were reviewed by me and considered in my medical decision making (see chart for details).   9:55 PM Patient in no distress, awake, alert, sitting upright.  He and I discussed all findings, including x-ray demonstrating posterior ninth rib fracture, EKG nonischemic, Arrhythmia genic.  With his initial blood pressure though substantially low, there is suspicion for dehydration contributing to today's episode.  After hours of monitoring he has had no substantial change on continuous cardiac evaluation.  He remains awake, alert, in no distress.  Patient comfortable with following up with a physician, will start analgesics for his rib fracture.   MDM Rules/Calculators/A&P MDM Number of Diagnoses or Management Options Closed fracture of one rib of right side, initial encounter: new, needed workup Heat syncope, initial encounter: new, needed  workup  Amount and/or Complexity of Data Reviewed Clinical lab tests: ordered and reviewed Tests in the radiology section of CPT: ordered and reviewed Tests in the medicine section of CPT: reviewed and ordered Decide to obtain previous medical records or to obtain history from someone other than the patient: yes Obtain history from someone other than the patient: yes Review and summarize past medical records: yes Independent visualization of images, tracings, or specimens: yes  Risk of Complications, Morbidity, and/or Mortality Presenting problems: high Diagnostic procedures: high Management options: high  Critical Care Total time providing critical care: < 30 minutes  Patient Progress Patient progress: improved   Final Clinical Impression(s) / ED Diagnoses Final diagnoses:  Heat syncope, initial encounter  Closed fracture of one rib of right side, initial encounter    Rx / DC Orders ED Discharge Orders          Ordered    Methyl Salicylate-Lido-Menthol 4-4-5 % PTCH  2 times daily        01/16/21 2159             Gerhard Munch, MD 01/16/21 2201

## 2021-01-16 NOTE — Discharge Instructions (Addendum)
As discussed, today's evaluation has been generally reassuring.  However, there is concern for your episode of dehydration substantial enough to contribute to losing consciousness.  Please be sure to follow-up with your physician for appropriate ongoing monitoring and management.  In addition, you have been diagnosed with a broken rib on the right.  Please obtain and use the prescribed medication.  These patches should be exchanged every 12 hours.  Place it in the painful area.  Return here for concerning changes in your condition.

## 2021-01-16 NOTE — ED Triage Notes (Signed)
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  The patient had been working outside today and had a syncopal episode.  When the patient fell, he hit his right side of his back on a trailer hitch.  BP 82/58 after 1 liter fluid bolus BP 116/70  Patient is alert and answers all questions appropriately.  Denies hitting his head.

## 2021-01-21 DIAGNOSIS — T671XXA Heat syncope, initial encounter: Secondary | ICD-10-CM | POA: Diagnosis not present

## 2021-01-21 DIAGNOSIS — Z7901 Long term (current) use of anticoagulants: Secondary | ICD-10-CM | POA: Diagnosis not present

## 2021-01-21 DIAGNOSIS — S2231XA Fracture of one rib, right side, initial encounter for closed fracture: Secondary | ICD-10-CM | POA: Diagnosis not present

## 2021-01-21 DIAGNOSIS — R001 Bradycardia, unspecified: Secondary | ICD-10-CM | POA: Diagnosis not present

## 2021-02-03 DIAGNOSIS — H04551 Acquired stenosis of right nasolacrimal duct: Secondary | ICD-10-CM | POA: Diagnosis not present

## 2021-02-03 DIAGNOSIS — R3914 Feeling of incomplete bladder emptying: Secondary | ICD-10-CM | POA: Diagnosis not present

## 2021-02-03 DIAGNOSIS — N281 Cyst of kidney, acquired: Secondary | ICD-10-CM | POA: Diagnosis not present

## 2021-02-03 DIAGNOSIS — R350 Frequency of micturition: Secondary | ICD-10-CM | POA: Diagnosis not present

## 2021-02-03 DIAGNOSIS — H25042 Posterior subcapsular polar age-related cataract, left eye: Secondary | ICD-10-CM | POA: Diagnosis not present

## 2021-02-03 DIAGNOSIS — H02132 Senile ectropion of right lower eyelid: Secondary | ICD-10-CM | POA: Diagnosis not present

## 2021-02-03 DIAGNOSIS — H401131 Primary open-angle glaucoma, bilateral, mild stage: Secondary | ICD-10-CM | POA: Diagnosis not present

## 2021-02-03 DIAGNOSIS — N200 Calculus of kidney: Secondary | ICD-10-CM | POA: Diagnosis not present

## 2021-02-10 ENCOUNTER — Other Ambulatory Visit: Payer: Self-pay

## 2021-02-10 ENCOUNTER — Ambulatory Visit: Payer: Medicare Other | Admitting: Cardiology

## 2021-02-10 ENCOUNTER — Encounter: Payer: Self-pay | Admitting: Cardiology

## 2021-02-10 VITALS — BP 124/83 | HR 54 | Temp 98.3°F | Resp 17 | Ht 75.0 in | Wt 151.6 lb

## 2021-02-10 DIAGNOSIS — R0600 Dyspnea, unspecified: Secondary | ICD-10-CM

## 2021-02-10 DIAGNOSIS — R0609 Other forms of dyspnea: Secondary | ICD-10-CM

## 2021-02-10 DIAGNOSIS — I10 Essential (primary) hypertension: Secondary | ICD-10-CM | POA: Diagnosis not present

## 2021-02-10 DIAGNOSIS — I493 Ventricular premature depolarization: Secondary | ICD-10-CM

## 2021-02-10 DIAGNOSIS — I77810 Thoracic aortic ectasia: Secondary | ICD-10-CM

## 2021-02-10 DIAGNOSIS — R55 Syncope and collapse: Secondary | ICD-10-CM

## 2021-02-10 HISTORY — PX: CATARACT EXTRACTION: SUR2

## 2021-02-10 NOTE — Progress Notes (Signed)
Primary Physician/Referring:  [name], MD  Patient ID: Joe Cantu, male    DOB: 04-05-1951, 70 y.o.   MRN: [id number]  Chief Complaint  Patient presents with   PVC    1 YEAR   HPI:    Joe Cantu  is a 71 y.o.  African American male patient with remote history of tobacco use, abnormal EKG frequent PVCs, aortic root dilatation, small abdominal aortic aneurysm, mild hyperlipidemia, no family history of sudden cardiac death. He was admitted to [facility] on 08/30/19 with submassive PE, acute cor pulmonale, syncope. Underwent catheter directed lytics, IVC filter placement on 08/31/2019.  He later developed hemoptysis and underwent bronchoscopy on 09/12/2019 and EGD with no evidence of an active bleed. He is now on Eliquis, hypercoagulable work-up negative and recommended continuation of anticoagulants for 6 to 12 months in view of spontaneous DVT-PE.  He underwent right heart catheterization and also IVC filter retrieval on 01/09/2020. He was seen in the emergency room on 01/16/2021 after an episode of syncope, was found to be hypotensive and also dehydrated.  After fluid resuscitation he was discharged home.  His main complaint today is continued weight loss and fatigue and deconditioning since being on beta-blocker therapy..  Past Medical History:  Diagnosis Date   Aortic root dilatation (HCC) 02/21/2019   Dyspnea on exertion    Frequent unifocal PVCs 02/21/2019   Glaucoma    Gonorrhea    18 & 19 yrs of age; negative for syphillis, herpes and HIV   Left inguinal hernia    Pneumonia    Ventricular premature beats    Wears dentures    Past Surgical History:  Procedure Laterality Date   BRONCHIAL WASHINGS  09/12/2019   Procedure: BRONCHIAL WASHINGS;  Surgeon: Chilton Greathouse, MD;  Location: [facility];  Service: Cardiopulmonary;;   COLONOSCOPY  07/2018   Benign polyps    ESOPHAGOGASTRODUODENOSCOPY (EGD) WITH PROPOFOL N/A 09/15/2019   Procedure: ESOPHAGOGASTRODUODENOSCOPY  (EGD) WITH PROPOFOL;  Surgeon: Jeani Hawking, MD;  Location: [facility];  Service: Endoscopy;  Laterality: N/A;   HIATAL HERNIA REPAIR     IR ANGIOGRAM PULMONARY BILATERAL SELECTIVE  08/30/2019   IR INFUSION THROMBOL ARTERIAL INITIAL (MS)  08/30/2019   IR INFUSION THROMBOL ARTERIAL INITIAL (MS)  08/30/2019   IR IVC FILTER PLMT / S&I /IMG GUID/MOD SED  08/31/2019   IR THROMB F/U EVAL ART/VEN FINAL DAY (MS)  08/31/2019   IR US GUIDE VASC ACCESS LEFT  08/30/2019   IR US GUIDE VASC ACCESS LEFT  08/30/2019   IVC FILTER REMOVAL N/A 01/09/2020   Procedure: IVC FILTER REMOVAL;  Surgeon: Joe Decamp, MD;  Location: [facility] INVASIVE CV LAB;  Service: Cardiovascular;  Laterality: N/A;   RIGHT HEART CATH N/A 01/09/2020   Procedure: RIGHT HEART CATH;  Surgeon: Joe Decamp, MD;  Location: [facility] INVASIVE CV LAB;  Service: Cardiovascular;  Laterality: N/A;   VIDEO BRONCHOSCOPY N/A 09/12/2019   Procedure: VIDEO BRONCHOSCOPY WITHOUT FLUORO;  Surgeon: Chilton Greathouse, MD;  Location: [facility];  Service: Cardiopulmonary;  Laterality: N/A;   Social History   Tobacco Use   Smoking status: Former    Packs/day: 0.50    Years: 10.00    Pack years: 5.00    Types: Cigarettes    Quit date: 1970    Years since quitting: 52.6   Smokeless tobacco: Never  Substance Use Topics   Alcohol use: Not Currently    Comment: occasional wine   Marital Status: Single  ROS  Review of Systems  Constitutional: Positive for malaise/fatigue and weight loss.  Cardiovascular:  Positive for dyspnea on exertion and leg swelling (bilateral chronic). Negative for chest pain.  Gastrointestinal:  Negative for melena.  Objective  Blood pressure 124/83, pulse (!) 54, temperature 98.3 F (36.8 C), temperature source Temporal, resp. rate 17, height 6\' 3"  (1.905 m), weight 151 lb 9.6 oz (68.8 kg), SpO2 98 %.  Vitals with BMI 02/10/2021 02/10/2021 01/16/2021  Height - 6\' 3"  -  Weight - 151 lbs 10 oz -  BMI - 18.95 -  Systolic 124 147 03/19/2021  Diastolic 83  88 79  Pulse 54 67 55     Physical Exam Cardiovascular:     Rate and Rhythm: Normal rate and regular rhythm.     Pulses: Intact distal pulses.     Heart sounds: Normal heart sounds. No murmur heard.   No gallop.     Comments: Trace left leg below knee leg edema. Non tender. No JVD. Pulmonary:     Effort: Pulmonary effort is normal.     Breath sounds: Normal breath sounds.  Abdominal:     General: Bowel sounds are normal.     Palpations: Abdomen is soft.   Laboratory examination:   Recent Labs    10/02/20 0954 12/17/20 1044 01/16/21 1804  NA 136 138 142  K 3.7 4.0 3.9  CL 109 109 114*  CO2 21* 22 20*  GLUCOSE 106* 90 105*  BUN 10 14 17   CREATININE 0.71 0.78 0.77  CALCIUM 10.7* 10.3 10.9*  GFRNONAA >60 >60 >60   CrCl cannot be calculated (Patient's most recent lab result is older than the maximum 21 days allowed.).  CMP Latest Ref Rng & Units 01/16/2021 12/17/2020 10/02/2020  Glucose 70 - 99 mg/dL 03/19/2021) 90 02/16/2021)  BUN 8 - 23 mg/dL 17 14 10   Creatinine 0.61 - 1.24 mg/dL 10/04/2020 188(C 166(A  Sodium 135 - 145 mmol/L 142 138 136  Potassium 3.5 - 5.1 mmol/L 3.9 4.0 3.7  Chloride 98 - 111 mmol/L 114(H) 109 109  CO2 22 - 32 mmol/L 20(L) 22 21(L)  Calcium 8.9 - 10.3 mg/dL 10.9(H) 10.3 10.7(H)  Total Protein 6.5 - 8.1 g/dL 7.1 7.2 7.1  Total Bilirubin 0.3 - 1.2 mg/dL ) 0.7 6.30)  Alkaline Phos 38 - 126 U/L 49 60 47  AST 15 - 41 U/L 21 19 19   ALT 0 - 44 U/L 22 19 16    CBC Latest Ref Rng & Units 01/16/2021 12/17/2020 10/02/2020  WBC 4.0 - 10.5 K/uL 6.9 4.4 5.4  Hemoglobin 13.0 - 17.0 g/dL 5.5(D   Hematocrit 39.0 - 52.0 % 39.9 39.8 39.8  Platelets 150 - 400 K/uL 224 233 257   Iron/TIBC/Ferritin/ %Sat    Component Value Date/Time   FERRITIN 581 (H) 11/16/2019 1330   Lab Results  Component Value Date   DDIMER 1.12 (H) 11/16/2019    Lipid Panel  No results found for: CHOL, TRIG, HDL, CHOLHDL, VLDL, LDLCALC, LDLDIRECT HEMOGLOBIN A1C No results found for: HGBA1C,  MPG TSH No results for input(s): TSH in the last 8760 hours.  External labs:  06/26/2020: TSH 1.78  08/28/2019: HDL 39, LDL 82, Cholesterol, total 137, Triglycerides 78.  04/19/2018: Total cholesterol 93, triglycerides 70, HDL 57, LDL 110.  Non-HDL cholesterol 126. A1c 5.4%, PSA normal.  Medications and allergies  No Known Allergies   Current Outpatient Medications  Medication Instructions   acetaminophen (TYLENOL) 325 mg, Oral, Every 12 hours PRN  dorzolamide (TRUSOPT) 2 % ophthalmic solution 1 drop, Both Eyes, 2 times daily   finasteride (PROSCAR) 5 MG tablet 1 tablet, Oral, Daily   magnesium hydroxide (MILK OF MAGNESIA) 400 MG/5ML suspension 15 mLs, Oral, At bedtime PRN   Methyl Salicylate-Lido-Menthol 4-4-5 % PTCH 1 patch, Apply externally, 2 times daily   PROLENSA 0.07 % SOLN 1 drop, Ophthalmic, As needed   tamsulosin (FLOMAX) 0.4 mg, Oral, Daily after breakfast   timolol (TIMOPTIC) 0.5 % ophthalmic solution 1 drop, Both Eyes, 2 times daily   VYZULTA 0.024 % SOLN 1 drop, Both Eyes, Daily at bedtime   Xarelto 20 mg, Oral, Daily with supper    Radiology:   Pulmonary Arteriography/Bilateral Pulm arterial lytic infusion cath 08/30/2019: IMPRESSION: 1. Successful fluoroscopic guided initiation of bilateral ultrasound assisted catheter directed pulmonary arterial lysis for sub massivepulmonary embolism and right-sided heart strain. 2. Elevated pressure measurements within the main pulmonary artery compatible with critical pulmonary arterial hypertension. 3. Near occlusive DVT noted within the right common femoral vein.  Vasc US lower Extremity Venous 08/30/2019: Summary:  RIGHT: - Findings consistent with acute deep vein thrombosis involving the SF junction, and right common femoral vein.  - Findings consistent with acute superficial vein thrombosis involving the right great saphenous vein.  LEFT: - Findings consistent with acute deep vein thrombosis involving the left common  femoral vein, left femoral vein, and left popliteal vein.   Percutaneous IVC Filter Placement 08/31/2019: IVC venography demonstrates a normal caliber IVC with no evidence of thrombus. Renal veins are identified bilaterally. The IVC filter was successfully positioned below the level of the renal veins and is appropriately oriented. This IVC filter has both permanent and retrievable indications. IMPRESSION: 1. Decrease in measured pulmonary artery pressure after 12 hours of bilateral pulmonary arterial thrombolytic therapy. 2. Placement of percutaneous IVC filter in infrarenal IVC. IVC venogram shows no evidence of IVC thrombus and normal caliber of the inferior vena cava. This filter does have both permanent and retrievable indications.  Cardiac Studies:   Lexiscan Sestamibi stress test 08/08/2018: 1. Lexiscan stress test was performed. Exercise capacity was not assessed. No stress symptoms reported. Resting blood pressure was 142/82 mmHg and peak effect blood pressure was 108/56 mmHg. The resting and stress electrocardiogram demonstrated normal sinus rhythm, normal resting conduction, frequent PVC's and normal rest repolarization. Stress EKG is non diagnostic for ischemia as it is a pharmacologic stress. 2. Left ventricular cavity is noted to be enlarged on the rest and stress studies.  Gated SPECT images mild decrease in myocardial thickening and wall motion.  The left ventricular ejection fraction was calculated or visually estimated to be 46%.  Small sized, mild intensity, fixed inferior perfusion defect likely related to tissue attenuation artifact. Findings likely represent nonischemic cardiomyopathy. 3. High risk study due to reduced LVEF and frequent PVC's.  Holter Monitor for 24 hours  09/08/2018:  Minimum heart rate 47, maximum heart rate 118 BPM, normal sinus.  PVC but this 18.3%.  20,600 beats PVCs noted 8 wearing couplets 48 wearing triplets and 435 wearing bigeminy, 135 in trigeminy.   Longest run 4 beats.  There were 102 PACs noted.  Abdominal Aortic Duplex  03/30/2019:  Moderate dilatation of the abdominal aorta is noted in the proximal aorta. An abdominal aortic aneurysm measuring 3.63 x 3.63 x 3.63 cm is seen.    Moderate heterogeneous plaque seen throughout the abdominal aorta.  Normal iliac artery velocity.  Recheck in 1 year for stability in size.   Right heart  catheterization and IVC filter retrieval 01/09/2020: Normal right heart catheterization pressures. Successful retrieval of Bard IVC filter without complications.  Echocardiogram 01/22/2020:  Left ventricle cavity is normal in size. Mild concentric hypertrophy of the left ventricle. Normal global wall motion. Normal LV systolic function  with EF 57%. Doppler evidence of grade I (impaired) diastolic dysfunction, normal LAP.  Trileaflet aortic valve.  Mild (Grade I) aortic regurgitation.  Myxomatous degeneration with mild bileaflet prolapse. Moderate (Grade II) mitral regurgitation.  Moderate tricuspid regurgitation. Estimated pulmonary artery systolic pressure is 44 mmHg.  Mild pulmonic regurgitation.  The aortic root is mildly dilated at 4.0 cm.  Compared to the study done on 09/11/2019, markedly enlarged right ventricle, D-shaped interventricular septum no longer present.  PA pressure previously was measured at 56 mmHg.   Lower extremity venous duplex 12/17/2020: RIGHT:  - Findings consistent with chronic deep vein thrombosis involving the right common femoral vein, and right femoral vein.  - No cystic structure found in the popliteal fossa.  LEFT:  - Findings consistent with age indeterminate deep vein thrombosis involving the left femoral vein, left popliteal vein, and left peroneal veins.  - No cystic structure found in the popliteal fossa.  No significant change from from 11/28/2019, 12/11/2019.  EKG:   EKG 02/10/2021: Sinus rhythm with first-degree AV block at rate of 57 bpm, biatrial enlargement, left axis  deviation, left intrafascicular block.  Incomplete right bundle branch block.  LVH.  Single PVC.  No significant change compared to 11/02/2019, first-degree AV block and biatrial enlargement is new.new.   Assessment     ICD-10-CM   1. Essential hypertension  I10 EKG 12-Lead    2. Frequent unifocal PVCs  I49.3     3. Dyspnea on exertion  R06.00 PCV ECHOCARDIOGRAM COMPLETE    4. Aortic root dilatation (HCC)  I77.810 PCV ECHOCARDIOGRAM COMPLETE    5. Vasovagal syncope  R55      No orders of the defined types were placed in this encounter.   Medications Discontinued During This Encounter  Medication Reason   docusate sodium (COLACE) 100 MG capsule Error   polyethylene glycol (MIRALAX / GLYCOLAX) 17 g packet Error   metoprolol tartrate (LOPRESSOR) 25 MG tablet Discontinued by provider    Recommendations:   Joe Cantu  is a 70 y.o. Black or African American [2] male patient with remote history of tobacco use, abnormal EKG frequent PVCs, aortic root dilatation, small abdominal aortic aneurysm, mild hyperlipidemia, no family history of sudden cardiac death. He was admitted to [facility] on 08/30/19 with submassive PE, acute cor pulmonale, syncope. Underwent catheter directed lytics, IVC filter placement on 08/31/2019.  He later developed hemoptysis and underwent bronchoscopy on 09/12/2019 and EGD with no evidence of an active bleed. He is now on Eliquis, hypercoagulable work-up negative and recommended lifelong anticoagulation by hematology due to unprovoked bilateral LE DVT and massive PE.   He underwent right heart catheterization and also IVC filter retrieval on 01/09/2020. He was seen in the emergency room on 01/16/2021 after an episode of syncope, was found to be hypotensive and also dehydrated.  After fluid resuscitation he was discharged home.  He presents for a annual visit, states that since last office visit since being on metoprolol he has lost weight, feels fatigue and also has  developed dyspnea.  He has new EKG abnormalities with biatrial enlargement, first-degree block.  I will repeat his echocardiogram to evaluate aortic root dilatation and also his dyspnea.  I will discontinue beta-blocker  therapy as his blood pressure is soft and also due to weight loss and fatigue and see how he does over the next 6 months.     Joe DecampJay Erine Phenix, MD, [facility] 02/10/2021, 4:02 PM Office: [phone]   CC: Joe CapriceGus Magrinat, MD

## 2021-02-13 ENCOUNTER — Other Ambulatory Visit: Payer: Self-pay

## 2021-02-13 ENCOUNTER — Ambulatory Visit: Payer: Medicare Other

## 2021-02-13 DIAGNOSIS — I77810 Thoracic aortic ectasia: Secondary | ICD-10-CM

## 2021-02-13 DIAGNOSIS — R0600 Dyspnea, unspecified: Secondary | ICD-10-CM

## 2021-02-13 DIAGNOSIS — R0609 Other forms of dyspnea: Secondary | ICD-10-CM

## 2021-02-17 DIAGNOSIS — H401131 Primary open-angle glaucoma, bilateral, mild stage: Secondary | ICD-10-CM | POA: Diagnosis not present

## 2021-02-17 DIAGNOSIS — Z961 Presence of intraocular lens: Secondary | ICD-10-CM | POA: Diagnosis not present

## 2021-02-17 DIAGNOSIS — H04551 Acquired stenosis of right nasolacrimal duct: Secondary | ICD-10-CM | POA: Diagnosis not present

## 2021-02-17 DIAGNOSIS — H2512 Age-related nuclear cataract, left eye: Secondary | ICD-10-CM | POA: Diagnosis not present

## 2021-02-17 NOTE — Progress Notes (Signed)
Called and spoke with patient regarding his echocardiogram results. Patient has not noticed any changes, feels fine.

## 2021-03-04 DIAGNOSIS — I2602 Saddle embolus of pulmonary artery with acute cor pulmonale: Secondary | ICD-10-CM | POA: Diagnosis not present

## 2021-03-04 DIAGNOSIS — R0602 Shortness of breath: Secondary | ICD-10-CM | POA: Diagnosis not present

## 2021-03-04 DIAGNOSIS — I82511 Chronic embolism and thrombosis of right femoral vein: Secondary | ICD-10-CM | POA: Diagnosis not present

## 2021-03-04 DIAGNOSIS — Z681 Body mass index (BMI) 19 or less, adult: Secondary | ICD-10-CM | POA: Diagnosis not present

## 2021-03-04 DIAGNOSIS — S2231XD Fracture of one rib, right side, subsequent encounter for fracture with routine healing: Secondary | ICD-10-CM | POA: Diagnosis not present

## 2021-03-04 DIAGNOSIS — R634 Abnormal weight loss: Secondary | ICD-10-CM | POA: Diagnosis not present

## 2021-03-04 DIAGNOSIS — I82512 Chronic embolism and thrombosis of left femoral vein: Secondary | ICD-10-CM | POA: Diagnosis not present

## 2021-03-04 DIAGNOSIS — I82532 Chronic embolism and thrombosis of left popliteal vein: Secondary | ICD-10-CM | POA: Diagnosis not present

## 2021-03-06 DIAGNOSIS — H2512 Age-related nuclear cataract, left eye: Secondary | ICD-10-CM | POA: Diagnosis not present

## 2021-03-06 DIAGNOSIS — H401121 Primary open-angle glaucoma, left eye, mild stage: Secondary | ICD-10-CM | POA: Diagnosis not present

## 2021-03-13 DIAGNOSIS — I2699 Other pulmonary embolism without acute cor pulmonale: Secondary | ICD-10-CM | POA: Insufficient documentation

## 2021-03-13 DIAGNOSIS — H04123 Dry eye syndrome of bilateral lacrimal glands: Secondary | ICD-10-CM | POA: Diagnosis not present

## 2021-03-13 DIAGNOSIS — H04551 Acquired stenosis of right nasolacrimal duct: Secondary | ICD-10-CM | POA: Diagnosis not present

## 2021-03-13 DIAGNOSIS — N429 Disorder of prostate, unspecified: Secondary | ICD-10-CM | POA: Insufficient documentation

## 2021-03-13 DIAGNOSIS — H409 Unspecified glaucoma: Secondary | ICD-10-CM | POA: Insufficient documentation

## 2021-03-13 DIAGNOSIS — H04221 Epiphora due to insufficient drainage, right lacrimal gland: Secondary | ICD-10-CM | POA: Diagnosis not present

## 2021-03-13 DIAGNOSIS — Z961 Presence of intraocular lens: Secondary | ICD-10-CM | POA: Diagnosis not present

## 2021-03-31 NOTE — Progress Notes (Signed)
[facility]  Telephone:([phone] Fax:([phone]     ID: Joe Cantu DOB: 03/04/1951  MR#: [id number]  DTO#:[id number]  Patient Care Team: [name], MD as PCP - General (Family Medicine) Haris Baack, Valentino Hue, MD as Consulting Physician (Oncology) Lowella Dell, MD OTHER MD:   CHIEF COMPLAINT: massive PE and left lower extremity DVT complicated by hemoptysis while on Xarelto  CURRENT TREATMENT: apixaban   INTERVAL HISTORY: Joe Cantu was scheduled today for follow up of his pulmonary embolism and  DVTs. However he did not show.  His most recent lower extremity venous ultrasound on 12/17/2020 showed chronic right and indeterminate age left DVTs bilateral DVT, overall unchanged from a year prior.  He also underwent angio chest CT on 12/17/2020 showing improved chronic pulmonary embolus.     REVIEW OF SYSTEMS: Joe Cantu   COVID 19 VACCINATION STATUS:     HISTORY OF CURRENT ILLNESS: From the original intake note:   Joe Cantu is a 70 year-old [place] man with a past medical history significant for PVCs, aortic root dilatation, recent diagnosis of acute massive PE with significant right heart dysfunction and left lower extremity DVT in February 2021.     He was hospitalized 08/30/2019 through 09/02/2019 for the PE and DVT.  With extensive questioning this appears to have been an unprovoked PE.  The right pulmonary artery was completely opacified, with no flow to the right lung, with significant clot in the left side as well.  He received catheter directed TPA and was placed on a heparin drip with transition to Xarelto.  He had a retrievable IVC filter placed on 08/31/2019 because there was residual clot in the legs   The patient did well at home after discharge although he was still "taking it slow".  On  09/09/2019 he developed a persistent cough and eventually hemoptysis.  He could not quantify how much blood he coughed up however per EMS report there was  approximately 300 to 600 cc of blood in a juice jug.     In the ER, his alkaline phosphatase was 211, albumin 2.3, AST 134, ALT 110, WBC 14.2, hemoglobin 10.4, INR 2.7.  His Xarelto was placed on hold and pulmonology has been consulted.  They recommended a CT of the chest and possible bronchoscopy which was done today and shows improvement but persistence of the bilateral PEs, with no obvious cause of the bleeding.  Due to concern for possible aspiration during his coughing episodes, he is currently on IV antibiotics.     Note that during his last hospitalization, a hyper coag panel was sent which was unrevealing with the exception of low protein S activity at 49%, likely reactive, heterozygosity for factor V Leiden.  We are asked to see the patient to make recommendations regarding anticoagulation in this patient with recent diagnosis of massive PE, left lower extremity DVT, and now with hemoptysis while on Xarelto.  The patient's subsequent history is as detailed below.   PAST MEDICAL HISTORY: Past Medical History:  Diagnosis Date   Aortic root dilatation (HCC) 02/21/2019   Dyspnea on exertion    Frequent unifocal PVCs 02/21/2019   Glaucoma    Gonorrhea    18 & 19 yrs of age; negative for syphillis, herpes and HIV   Left inguinal hernia    Pneumonia    Ventricular premature beats    Wears dentures     PAST SURGICAL HISTORY: Past Surgical History:  Procedure Laterality Date   BRONCHIAL WASHINGS  09/12/2019   Procedure: BRONCHIAL WASHINGS;  Surgeon: Chilton Greathouse, MD;  Location: [facility];  Service: Cardiopulmonary;;   COLONOSCOPY  07/2018   Benign polyps    ESOPHAGOGASTRODUODENOSCOPY (EGD) WITH PROPOFOL N/A 09/15/2019   Procedure: ESOPHAGOGASTRODUODENOSCOPY (EGD) WITH PROPOFOL;  Surgeon: Jeani Hawking, MD;  Location: [facility];  Service: Endoscopy;  Laterality: N/A;   HIATAL HERNIA REPAIR     IR ANGIOGRAM PULMONARY BILATERAL SELECTIVE  08/30/2019   IR INFUSION THROMBOL ARTERIAL  INITIAL (MS)  08/30/2019   IR INFUSION THROMBOL ARTERIAL INITIAL (MS)  08/30/2019   IR IVC FILTER PLMT / S&I /IMG GUID/MOD SED  08/31/2019   IR THROMB F/U EVAL ART/VEN FINAL DAY (MS)  08/31/2019   IR US GUIDE VASC ACCESS LEFT  08/30/2019   IR US GUIDE VASC ACCESS LEFT  08/30/2019   IVC FILTER REMOVAL N/A 01/09/2020   Procedure: IVC FILTER REMOVAL;  Surgeon: Yates Decamp, MD;  Location: [facility] CV [facility];  Service: Cardiovascular;  Laterality: N/A;   RIGHT HEART CATH N/A 01/09/2020   Procedure: RIGHT HEART CATH;  Surgeon: Yates Decamp, MD;  Location: [facility] INVASIVE CV LAB;  Service: Cardiovascular;  Laterality: N/A;   VIDEO BRONCHOSCOPY N/A 09/12/2019   Procedure: VIDEO BRONCHOSCOPY WITHOUT FLUORO;  Surgeon: Chilton Greathouse, MD;  Location: [facility];  Service: Cardiopulmonary;  Laterality: N/A;    FAMILY HISTORY: Family History  Problem Relation Age of Onset   Hypertension Mother    Diabetes Mother    Hypertension Father    Cancer Father        colon   Diabetes Sister    Diabetes Brother      SOCIAL HISTORY: (updated 09/2019)  Joe Cantu is single.  He reports that he has 6 children who live throughout [place].  He lives with his fiance Joe Cantu, who works as a bus attendant, and shepherds children to school buses.  The patient himself works in Production designer, theatre/television/film at [employer].  Denies history of alcohol use.  He previously smoked 1/2 pack/day of cigarettes for approximately 10 years but quit back in about 1977.     ADVANCED DIRECTIVES: The patient tells me his sister is Joe Cantu is his healthcare power of attorney.  She can be reached at [phone].   HEALTH MAINTENANCE: Social History   Tobacco Use   Smoking status: Former    Packs/day: 0.50    Years: 10.00    Pack years: 5.00    Types: Cigarettes    Quit date: 1970    Years since quitting: 52.7   Smokeless tobacco: Never  Vaping Use   Vaping Use: Never used  Substance Use Topics   Alcohol use: Not  Currently    Comment: occasional wine   Drug use: No     Colonoscopy: Reports history of colonoscopy about 5 years ago.  States that he had a polyp which was benign.  He is uncertain of the exact date.  Bone density: never done   No Known Allergies  Current Outpatient Medications  Medication Sig Dispense Refill   acetaminophen (TYLENOL) 325 MG tablet Take 325 mg by mouth every 12 (twelve) hours as needed for mild pain or headache.     dorzolamide (TRUSOPT) 2 % ophthalmic solution Place 1 drop into both eyes 2 (two) times daily.     finasteride (PROSCAR) 5 MG tablet Take 1 tablet by mouth daily.     magnesium hydroxide (MILK OF MAGNESIA) 400 MG/5ML suspension Take 15 mLs by mouth at bedtime as  needed for mild constipation. 355 mL 0   Methyl Salicylate-Lido-Menthol 4-4-5 % PTCH Apply 1 patch topically in the morning and at bedtime. 30 patch 0   PROLENSA 0.07 % SOLN Apply 1 drop to eye as needed.     tamsulosin (FLOMAX) 0.4 MG CAPS capsule Take 0.4 mg by mouth daily after breakfast.     timolol (TIMOPTIC) 0.5 % ophthalmic solution Place 1 drop into both eyes in the morning and at bedtime.     VYZULTA 0.024 % SOLN Place 1 drop into both eyes at bedtime.     XARELTO 20 MG TABS tablet Take 1 tablet (20 mg total) by mouth daily with supper. 90 tablet 1   No current facility-administered medications for this visit.    OBJECTIVE:    There were no vitals filed for this visit.    There is no height or weight on file to calculate BMI.   Wt Readings from Last 3 Encounters:  02/10/21 151 lb 9.6 oz (68.8 kg)  01/16/21 155 lb (70.3 kg)  03/29/20 162 lb 6.4 oz (73.7 kg)     ECOG FS-0   LAB RESULTS:  CMP     Component Value Date/Time   NA 142 01/16/2021 1804   K 3.9 01/16/2021 1804   CL 114 (H) 01/16/2021 1804   CO2 20 (L) 01/16/2021 1804   GLUCOSE 105 (H) 01/16/2021 1804   BUN 17 01/16/2021 1804   CREATININE 0.77 01/16/2021 1804   CREATININE 0.78 12/17/2020 1044   CALCIUM 10.9 (H)  01/16/2021 1804   PROT 7.1 01/16/2021 1804   ALBUMIN 4.0 01/16/2021 1804   AST 21 01/16/2021 1804   AST 19 12/17/2020 1044   ALT 22 01/16/2021 1804   ALT 19 12/17/2020 1044   ALKPHOS 49 01/16/2021 1804   BILITOT 1.4 (H) 01/16/2021 1804   BILITOT 0.7 12/17/2020 1044   GFRNONAA >60 01/16/2021 1804   GFRNONAA >60 12/17/2020 1044   GFRAA >60 12/18/2019 1350    No results found for: TOTALPROTELP, ALBUMINELP, A1GS, A2GS, BETS, BETA2SER, GAMS, MSPIKE, SPEI  Lab Results  Component Value Date   WBC 6.9 01/16/2021   NEUTROABS 5.2 01/16/2021   HGB 13.7 01/16/2021   HCT 39.9 01/16/2021   MCV 94.1 01/16/2021   PLT 224 01/16/2021    No results found for: LABCA2  No components found for: [id number]  No results for input(s): INR in the last 168 hours.  No results found for: LABCA2  No results found for: GMW102  No results found for: VOZ366  No results found for: YQI347  No results found for: CA2729  No components found for: HGQUANT  No results found for: CEA1 / No results found for: CEA1   No results found for: AFPTUMOR  No results found for: CHROMOGRNA  No results found for: KPAFRELGTCHN, LAMBDASER, KAPLAMBRATIO (kappa/lambda light chains)  No results found for: HGBA, HGBA2QUANT, HGBFQUANT, HGBSQUAN (Hemoglobinopathy evaluation)   No results found for: LDH  No results found for: IRON, TIBC, IRONPCTSAT (Iron and TIBC)  Lab Results  Component Value Date   FERRITIN 581 (H) 11/16/2019    Urinalysis    Component Value Date/Time   COLORURINE STRAW (A) 10/02/2020 0954   APPEARANCEUR CLEAR 10/02/2020 0954   LABSPEC 1.005 10/02/2020 0954   PHURINE 7.0 10/02/2020 0954   GLUCOSEU NEGATIVE 10/02/2020 0954   HGBUR NEGATIVE 10/02/2020 0954   BILIRUBINUR NEGATIVE 10/02/2020 0954   KETONESUR NEGATIVE 10/02/2020 0954   PROTEINUR NEGATIVE 10/02/2020 0954   NITRITE NEGATIVE 10/02/2020  0954   LEUKOCYTESUR NEGATIVE 10/02/2020 0954    STUDIES: No results  found.   ELIGIBLE FOR AVAILABLE RESEARCH PROTOCOL: no  ASSESSMENT: 70 y.o. [place] man with unprovoked DVT and PE, on Rivaroxaban.   1. Unprovoked DVT and PE on 08/30/2019             (a) s/p catheter directed TPA 2/17 and removable IVC filter placement 08/31/2019             (b) Rivaroxaban BID at 15 mg             (c) hypercoagulable panel negative  (d) repeat CT angio 09/11/2019 shows slightly decreased bilateral large pulmonary emboli  (e) repeat bilateral lower extremity Dopplers 11/13/2019 show bilateral chronic DVTs with partial resolution on the left  (f) removal of IVC filter   2. Hemoptysis 09/10/2019 and subsequent, resolved             (a) Echo 09/11/2019, EF 50-55%              (b) Bronchoscopy on 09/12/2019 shows no active bleeding or etiology             (c) EGD 09/15/2019 showed normal esophagus stomach and duodenum             (d) Rivaroxaban at 15mg  daily, increased to 20 mg daily beginning 10/13/2019    3. Positive blood and urine culture: E Coli on 2/28-pan sensitive: treated, resolved              4. Deconditioning             (a) status post physical therapy, with significant improvement  5. anemia: Nearly resolved   PLAN: Joe Cantu did not show for his 04/01/2021 visit.  A follow-up letter has been sent.  04/03/2021. Joe Gaber, MD 04/01/21 3:48 PM [facility] [address] [place], [place] Tel. [phone]    Fax. [phone]   I, [phone], am acting as scribe for Dr. Mickie Bail. Jaala Bohle.  Valentino Huegcmend   *Total Encounter Time as defined by the Centers for Medicare and Medicaid Services includes, in addition to the face-to-face time of a patient visit (documented in the note above) non-face-to-face time: obtaining and reviewing outside history, ordering and reviewing medications, tests or procedures, care coordination (communications with other health care professionals or caregivers) and  documentation in the medical record.

## 2021-04-01 ENCOUNTER — Encounter: Payer: Self-pay | Admitting: Oncology

## 2021-04-01 ENCOUNTER — Inpatient Hospital Stay (HOSPITAL_BASED_OUTPATIENT_CLINIC_OR_DEPARTMENT_OTHER): Payer: Medicare Other | Admitting: Oncology

## 2021-04-01 ENCOUNTER — Inpatient Hospital Stay: Payer: Medicare Other | Attending: Oncology

## 2021-04-01 DIAGNOSIS — I82402 Acute embolism and thrombosis of unspecified deep veins of left lower extremity: Secondary | ICD-10-CM

## 2021-04-29 DIAGNOSIS — R970 Elevated carcinoembryonic antigen [CEA]: Secondary | ICD-10-CM | POA: Diagnosis not present

## 2021-04-29 DIAGNOSIS — I82532 Chronic embolism and thrombosis of left popliteal vein: Secondary | ICD-10-CM | POA: Diagnosis not present

## 2021-04-29 DIAGNOSIS — R0602 Shortness of breath: Secondary | ICD-10-CM | POA: Diagnosis not present

## 2021-04-29 DIAGNOSIS — R634 Abnormal weight loss: Secondary | ICD-10-CM | POA: Diagnosis not present

## 2021-04-29 DIAGNOSIS — I2602 Saddle embolus of pulmonary artery with acute cor pulmonale: Secondary | ICD-10-CM | POA: Diagnosis not present

## 2021-04-29 DIAGNOSIS — I82511 Chronic embolism and thrombosis of right femoral vein: Secondary | ICD-10-CM | POA: Diagnosis not present

## 2021-04-29 DIAGNOSIS — Z681 Body mass index (BMI) 19 or less, adult: Secondary | ICD-10-CM | POA: Diagnosis not present

## 2021-05-01 ENCOUNTER — Telehealth: Payer: Self-pay

## 2021-05-15 ENCOUNTER — Other Ambulatory Visit: Payer: Self-pay | Admitting: Gastroenterology

## 2021-05-15 DIAGNOSIS — D7389 Other diseases of spleen: Secondary | ICD-10-CM

## 2021-05-16 DIAGNOSIS — H04123 Dry eye syndrome of bilateral lacrimal glands: Secondary | ICD-10-CM | POA: Diagnosis not present

## 2021-05-16 DIAGNOSIS — H401133 Primary open-angle glaucoma, bilateral, severe stage: Secondary | ICD-10-CM | POA: Diagnosis not present

## 2021-05-16 DIAGNOSIS — H26493 Other secondary cataract, bilateral: Secondary | ICD-10-CM | POA: Diagnosis not present

## 2021-06-04 ENCOUNTER — Ambulatory Visit
Admission: RE | Admit: 2021-06-04 | Discharge: 2021-06-04 | Disposition: A | Discharge status: Home / Self Care | Payer: Medicare Other | Source: Ambulatory Visit | Attending: Gastroenterology | Admitting: Gastroenterology

## 2021-06-04 ENCOUNTER — Other Ambulatory Visit: Payer: Self-pay

## 2021-06-04 DIAGNOSIS — D7389 Other diseases of spleen: Secondary | ICD-10-CM | POA: Diagnosis not present

## 2021-06-04 DIAGNOSIS — N281 Cyst of kidney, acquired: Secondary | ICD-10-CM | POA: Diagnosis not present

## 2021-06-04 MED ORDER — GADOBENATE DIMEGLUMINE 529 MG/ML IV SOLN
14.0000 mL | Freq: Once | INTRAVENOUS | Status: AC | PRN
  Administered 2021-06-04: 14 mL via INTRAVENOUS

## 2021-06-09 DIAGNOSIS — H16143 Punctate keratitis, bilateral: Secondary | ICD-10-CM | POA: Diagnosis not present

## 2021-06-09 DIAGNOSIS — Z961 Presence of intraocular lens: Secondary | ICD-10-CM | POA: Diagnosis not present

## 2021-06-26 DIAGNOSIS — H401131 Primary open-angle glaucoma, bilateral, mild stage: Secondary | ICD-10-CM | POA: Diagnosis not present

## 2021-06-26 DIAGNOSIS — H18413 Arcus senilis, bilateral: Secondary | ICD-10-CM | POA: Diagnosis not present

## 2021-06-26 DIAGNOSIS — H04551 Acquired stenosis of right nasolacrimal duct: Secondary | ICD-10-CM | POA: Diagnosis not present

## 2021-06-26 DIAGNOSIS — Z961 Presence of intraocular lens: Secondary | ICD-10-CM | POA: Diagnosis not present

## 2021-06-30 NOTE — Telephone Encounter (Signed)
error 

## 2021-07-21 ENCOUNTER — Encounter: Payer: Self-pay | Admitting: Cardiology

## 2021-07-31 DIAGNOSIS — R0602 Shortness of breath: Secondary | ICD-10-CM | POA: Diagnosis not present

## 2021-07-31 DIAGNOSIS — R634 Abnormal weight loss: Secondary | ICD-10-CM | POA: Diagnosis not present

## 2021-07-31 DIAGNOSIS — N401 Enlarged prostate with lower urinary tract symptoms: Secondary | ICD-10-CM | POA: Insufficient documentation

## 2021-07-31 DIAGNOSIS — I719 Aortic aneurysm of unspecified site, without rupture: Secondary | ICD-10-CM | POA: Diagnosis not present

## 2021-07-31 DIAGNOSIS — E785 Hyperlipidemia, unspecified: Secondary | ICD-10-CM | POA: Diagnosis not present

## 2021-07-31 DIAGNOSIS — R069 Unspecified abnormalities of breathing: Secondary | ICD-10-CM | POA: Diagnosis not present

## 2021-07-31 DIAGNOSIS — D649 Anemia, unspecified: Secondary | ICD-10-CM | POA: Diagnosis not present

## 2021-07-31 DIAGNOSIS — N200 Calculus of kidney: Secondary | ICD-10-CM | POA: Insufficient documentation

## 2021-07-31 DIAGNOSIS — D643 Other sideroblastic anemias: Secondary | ICD-10-CM | POA: Diagnosis not present

## 2021-08-08 DIAGNOSIS — R3914 Feeling of incomplete bladder emptying: Secondary | ICD-10-CM | POA: Insufficient documentation

## 2021-08-08 DIAGNOSIS — R350 Frequency of micturition: Secondary | ICD-10-CM | POA: Diagnosis not present

## 2021-08-08 DIAGNOSIS — N401 Enlarged prostate with lower urinary tract symptoms: Secondary | ICD-10-CM | POA: Insufficient documentation

## 2021-08-08 DIAGNOSIS — N281 Cyst of kidney, acquired: Secondary | ICD-10-CM | POA: Diagnosis not present

## 2021-08-08 DIAGNOSIS — N200 Calculus of kidney: Secondary | ICD-10-CM | POA: Diagnosis not present

## 2021-08-13 ENCOUNTER — Encounter: Payer: Self-pay | Admitting: Cardiology

## 2021-08-13 ENCOUNTER — Ambulatory Visit: Payer: Medicare Other | Admitting: Cardiology

## 2021-08-13 ENCOUNTER — Other Ambulatory Visit: Payer: Self-pay

## 2021-08-13 VITALS — BP 112/66 | HR 56 | Temp 97.4°F | Resp 17 | Ht 75.0 in | Wt 144.0 lb

## 2021-08-13 DIAGNOSIS — I77810 Thoracic aortic ectasia: Secondary | ICD-10-CM

## 2021-08-13 DIAGNOSIS — I493 Ventricular premature depolarization: Secondary | ICD-10-CM | POA: Diagnosis not present

## 2021-08-13 DIAGNOSIS — I10 Essential (primary) hypertension: Secondary | ICD-10-CM

## 2021-08-13 NOTE — Progress Notes (Signed)
Primary Physician/Referring:  Iona Beard, MD  Patient ID: Joe Cantu, male    DOB: 12-28-1950, 71 y.o.   MRN: [id number]  Chief Complaint  Patient presents with   aortic root dilation    6 month   Shortness of Breath   pvc   HPI:    Joe Cantu  is a 71 y.o.  male patient with remote history of tobacco use, abnormal EKG frequent PVCs, aortic root dilatation,  no family history of sudden cardiac death, submassive bilateral DVT and PE on 08/30/2019, negative hypercoagulable work-up, due to unprovoked bilateral lower extremity DVT and submassive PE was recommended lifelong anticoagulation by hematology.  He has had IVC filter retrieved by me in June 2021.  He has not had any recurrence of DVT.  His main complaint is weight loss. .  Past Medical History:  Diagnosis Date   Aortic root dilatation ([place]) 02/21/2019   Dyspnea on exertion    Frequent unifocal PVCs 02/21/2019   Glaucoma    Gonorrhea    66 & 19 yrs of age; negative for syphillis, herpes and HIV   Left inguinal hernia    Pneumonia    Ventricular premature beats    Wears dentures    Past Surgical History:  Procedure Laterality Date   BRONCHIAL WASHINGS  09/12/2019   Procedure: BRONCHIAL WASHINGS;  Surgeon: Marshell Garfinkel, MD;  Location: [facility];  Service: Cardiopulmonary;;   CATARACT EXTRACTION  02/2021   COLONOSCOPY  07/2018   Benign polyps    ESOPHAGOGASTRODUODENOSCOPY (EGD) WITH PROPOFOL N/A 09/15/2019   Procedure: ESOPHAGOGASTRODUODENOSCOPY (EGD) WITH PROPOFOL;  Surgeon: Carol Ada, MD;  Location: [facility];  Service: Endoscopy;  Laterality: N/A;   HIATAL HERNIA REPAIR     IR ANGIOGRAM PULMONARY BILATERAL SELECTIVE  08/30/2019   IR INFUSION THROMBOL ARTERIAL INITIAL (MS)  08/30/2019   IR INFUSION THROMBOL ARTERIAL INITIAL (MS)  08/30/2019   IR IVC FILTER PLMT / S&I /IMG GUID/MOD SED  08/31/2019   IR THROMB F/U EVAL ART/VEN FINAL DAY (MS)  08/31/2019   IR US GUIDE VASC ACCESS LEFT  08/30/2019   IR  US GUIDE VASC ACCESS LEFT  08/30/2019   IVC FILTER REMOVAL N/A 01/09/2020   Procedure: IVC FILTER REMOVAL;  Surgeon: Adrian Prows, MD;  Location: [place] [facility];  Service: Cardiovascular;  Laterality: N/A;   RIGHT HEART CATH N/A 01/09/2020   Procedure: RIGHT HEART CATH;  Surgeon: Adrian Prows, MD;  Location: [place] [facility];  Service: Cardiovascular;  Laterality: N/A;   VIDEO BRONCHOSCOPY N/A 09/12/2019   Procedure: VIDEO BRONCHOSCOPY WITHOUT FLUORO;  Surgeon: Marshell Garfinkel, MD;  Location: [facility];  Service: Cardiopulmonary;  Laterality: N/A;   Social History   Tobacco Use   Smoking status: Former    Packs/day: 0.50    Years: 10.00    Pack years: 5.00    Types: Cigarettes    Quit date: 1970    Years since quitting: 53.1   Smokeless tobacco: Never  Substance Use Topics   Alcohol use: Not Currently    Comment: occasional wine   Marital Status: Single  ROS  Review of Systems  Constitutional: Positive for weight loss.  Cardiovascular:  Negative for chest pain, dyspnea on exertion and leg swelling.  Objective  Blood pressure 112/66, pulse (!) 56, temperature (!) 97.4 F (36.3 C), temperature source Temporal, resp. rate 17, height 6\' 3"  (1.905 m), weight 144 lb (65.3 kg), SpO2 99 %.  Vitals with BMI 08/13/2021 02/10/2021 02/10/2021  Height 6\' 3"  - 6\' 3"   Weight 144 lbs - 151 lbs 10 oz  BMI 18 - [id number]  Systolic [id number]  Diastolic 66 83 88  Pulse 56 54 67     Physical Exam Cardiovascular:     Rate and Rhythm: Normal rate and regular rhythm.     Pulses: Intact distal pulses.     Heart sounds: Normal heart sounds. No murmur heard.   No gallop.     Comments: Trace left leg below knee leg edema. Non tender. No JVD. Pulmonary:     Effort: Pulmonary effort is normal.     Breath sounds: Normal breath sounds.  Abdominal:     General: Bowel sounds are normal.     Palpations: Abdomen is soft.   Laboratory examination:   Recent Labs    10/02/20 0954 12/17/20 1044  01/16/21 1804  NA 136 138 142  K 3.7 4.0 3.9  CL 109 109 114*  CO2 21* 22 20*  GLUCOSE 106* 90 105*  BUN 10 14 17   CREATININE 0.71 0.78 0.77  CALCIUM 10.7* 10.3 10.9*  GFRNONAA >60 >60 >60   CrCl cannot be calculated (Patient's most recent lab result is older than the maximum 21 days allowed.).  CMP Latest Ref Rng & Units 01/16/2021 12/17/2020 10/02/2020  Glucose 70 - 99 mg/dL 105(H) 90 106(H)  BUN 8 - 23 mg/dL 17 14 10   Creatinine 0.61 - 1.24 mg/dL 0.77 0.78 0.71  Sodium 135 - 145 mmol/L 142 138 136  Potassium 3.5 - 5.1 mmol/L 3.9 4.0 3.7  Chloride 98 - 111 mmol/L 114(H) 109 109  CO2 22 - 32 mmol/L 20(L) 22 21(L)  Calcium 8.9 - 10.3 mg/dL 10.9(H) 10.3 10.7(H)  Total Protein 6.5 - 8.1 g/dL 7.1 7.2 7.1  Total Bilirubin 0.3 - 1.2 mg/dL 1.4(H) 0.7 1.4(H)  Alkaline Phos 38 - 126 U/L 49 60 47  AST 15 - 41 U/L 21 19 19   ALT 0 - 44 U/L 22 19 16    CBC Latest Ref Rng & Units 01/16/2021 12/17/2020 10/02/2020  WBC 4.0 - 10.5 K/uL 6.9 4.4 5.4  Hemoglobin 13.0 - 17.0 g/dL 13.7 14.0 14.3  Hematocrit 39.0 - 52.0 % 39.9 39.8 39.8  Platelets 150 - 400 K/uL 224 233 257   Iron/TIBC/Ferritin/ %Sat    Component Value Date/Time   FERRITIN 581 (H) 11/16/2019 1330   Lab Results  Component Value Date   DDIMER 1.12 (H) 11/16/2019   External labs:  06/26/2020: TSH 1.78  08/28/2019: HDL 39, LDL 82, Cholesterol, total 137, Triglycerides 78.  04/19/2018: Total cholesterol 93, triglycerides 70, HDL 57, LDL 110.  Non-HDL cholesterol 126. A1c 5.4%, PSA normal.  Medications and allergies  No Known Allergies   Current Outpatient Medications  Medication Instructions   acetaminophen (TYLENOL) 325 mg, Oral, Every 12 hours PRN   dorzolamide (TRUSOPT) 2 % ophthalmic solution 1 drop, Both Eyes, 2 times daily   magnesium hydroxide (MILK OF MAGNESIA) 400 MG/5ML suspension 15 mLs, Oral, At bedtime PRN   PROLENSA 0.07 % SOLN 1 drop, Ophthalmic, As needed   tamsulosin (FLOMAX) 0.4 mg, Oral, Daily after breakfast    timolol (TIMOPTIC) 0.5 % ophthalmic solution 1 drop, Both Eyes, 2 times daily   VYZULTA 0.024 % SOLN 1 drop, Both Eyes, Daily at bedtime   Xarelto 20 mg, Oral, Daily with supper    Radiology:   Pulmonary Arteriography/Bilateral Pulm arterial lytic infusion cath 08/30/2019: IMPRESSION: 1. Successful fluoroscopic guided initiation of bilateral ultrasound  assisted catheter directed pulmonary arterial lysis for sub massivepulmonary embolism and right-sided heart strain. 2. Elevated pressure measurements within the main pulmonary artery compatible with critical pulmonary arterial hypertension. 3. Near occlusive DVT noted within the right common femoral vein.  Vasc US lower Extremity Venous 08/30/2019: Summary:  RIGHT: - Findings consistent with acute deep vein thrombosis involving the SF junction, and right common femoral vein.  - Findings consistent with acute superficial vein thrombosis involving the right great saphenous vein.  LEFT: - Findings consistent with acute deep vein thrombosis involving the left common femoral vein, left femoral vein, and left popliteal vein.   MRI of the abdomen 06/05/2021: No pathologically enlarged lymph nodes or abdominal attic aneurysm identified.  Cardiac Studies:   Lexiscan Sestamibi stress test 08/08/2018: 1. Lexiscan stress test was performed. Exercise capacity was not assessed. No stress symptoms reported. Resting blood pressure was 142/82 mmHg and peak effect blood pressure was 108/56 mmHg. The resting and stress electrocardiogram demonstrated normal sinus rhythm, normal resting conduction, frequent PVC's and normal rest repolarization. Stress EKG is non diagnostic for ischemia as it is a pharmacologic stress. 2. Left ventricular cavity is noted to be enlarged on the rest and stress studies.  Gated SPECT images mild decrease in myocardial thickening and wall motion.  The left ventricular ejection fraction was calculated or visually estimated to be 46%.   Small sized, mild intensity, fixed inferior perfusion defect likely related to tissue attenuation artifact. Findings likely represent nonischemic cardiomyopathy. 3. High risk study due to reduced LVEF and frequent PVC's.  Holter Monitor for 24 hours  09/08/2018:  Minimum heart rate 47, maximum heart rate 118 BPM, normal sinus.  PVC but this 18.3%.  20,600 beats PVCs noted 8 wearing couplets 48 wearing triplets and 435 wearing bigeminy, 135 in trigeminy.  Longest run 4 beats.  There were 102 PACs noted.  Right heart catheterization and IVC filter retrieval 01/09/2020: Normal right heart catheterization pressures. Successful retrieval of Bard IVC filter without complications.  Lower extremity venous duplex 12/17/2020: RIGHT:  - Findings consistent with chronic deep vein thrombosis involving the right common femoral vein, and right femoral vein.  - No cystic structure found in the popliteal fossa.  LEFT:  - Findings consistent with age indeterminate deep vein thrombosis involving the left femoral vein, left popliteal vein, and left peroneal veins.  - No cystic structure found in the popliteal fossa.  No significant change from from 11/28/2019, 12/11/2019.  Echocardiogram 02/13/2021: Normal LV systolic function with visual EF 60-65%. Left ventricle cavity is normal in size. Normal left ventricular wall thickness. Normal global wall motion. Normal diastolic filling pattern, normal LAP.  Trace aortic regurgitation. Myxomatous degeneration with mild bileaflet prolapse. Mild (Grade I) mitral regurgitation. Mild tricuspid regurgitation. Mild pulmonary hypertension. RVSP measures 37 mmHg. The aortic root is mildly dilated (Sinus of Valsalva 4.14cm).  Proximal ascending aorta not well visualized. Compared to study 01/22/2020: Aortic root was 4.0cm and now 4.1cm otherwise no significant change. Compared to the study done on 09/11/2019, markedly enlarged right ventricle, D-shaped interventricular septum no  longer present.  PA pressure previously was measured at 56 mmHg.   EKG:  EKG 08/13/2021: Normal sinus rhythm  @ 54 bpm, left atrial enlargement, left axis deviation, left anterior fascicular block.  Nonspecific T abnormality.  No significant change from 03/01/2021.  Assessment     ICD-10-CM   1. Essential hypertension  I10 EKG 12-Lead    2. Frequent unifocal PVCs  I49.3     3. Aortic  root dilatation (HCC)  I77.810 PCV ECHOCARDIOGRAM COMPLETE     No orders of the defined types were placed in this encounter.    Medications Discontinued During This Encounter  Medication Reason   Methyl Salicylate-Lido-Menthol 4-4-5 % PTCH     Recommendations:   Joe Cantu  is a 71 y.o. Black or African American [2] male patient with remote history of tobacco use, abnormal EKG frequent PVCs, aortic root dilatation,  no family history of sudden cardiac death, submassive bilateral DVT and PE on 08/30/2019, negative hypercoagulable work-up, due to unprovoked bilateral lower extremity DVT and submassive PE was recommended lifelong anticoagulation by hematology.  He has had IVC filter retrieved by me in June 2021.  He has not had any recurrence of DVT.  His main complaint is weight loss.  Physical examination is unremarkable.  EKG is normal.  There is complete resolution of leg edema.  From cardiac standpoint he remains stable.  I will repeat echocardiogram to follow-up on aortic root dilatation.  Although he was found to have small AAA, abdominal MRI x2 has not revealed aortic aneurysm.  He does not need any further surveillance.  Blood pressure is normal, if aortic root dilatation is confirmed, he may benefit from being on losartan.  Otherwise stable from cardiac standpoint, I will see him back in a year or sooner if problems.  I have encouraged him to follow-up with GI regarding his weight loss and loss of appetite.   His wife is present and all questions answered.  I encouraged him to contact Dr. Carol Ada regarding weight loss and loss of appetite.    Adrian Prows, MD, [facility] 08/13/2021, 3:03 PM Office: [phone]   CC: Tressa Busman, MD

## 2021-08-14 DIAGNOSIS — R634 Abnormal weight loss: Secondary | ICD-10-CM | POA: Diagnosis not present

## 2021-08-14 DIAGNOSIS — E785 Hyperlipidemia, unspecified: Secondary | ICD-10-CM | POA: Diagnosis not present

## 2021-08-14 DIAGNOSIS — F32A Depression, unspecified: Secondary | ICD-10-CM | POA: Diagnosis not present

## 2021-08-19 ENCOUNTER — Other Ambulatory Visit: Payer: Self-pay

## 2021-08-19 ENCOUNTER — Ambulatory Visit: Payer: Medicare Other

## 2021-08-19 DIAGNOSIS — I10 Essential (primary) hypertension: Secondary | ICD-10-CM | POA: Diagnosis not present

## 2021-08-19 DIAGNOSIS — I77810 Thoracic aortic ectasia: Secondary | ICD-10-CM

## 2021-08-22 ENCOUNTER — Encounter (HOSPITAL_COMMUNITY): Payer: Self-pay

## 2021-08-22 ENCOUNTER — Emergency Department (HOSPITAL_BASED_OUTPATIENT_CLINIC_OR_DEPARTMENT_OTHER)
Admission: EM | Admit: 2021-08-22 | Discharge: 2021-08-22 | Disposition: A | Discharge status: Home / Self Care | Payer: Medicare Other | Attending: Emergency Medicine | Admitting: Emergency Medicine

## 2021-08-22 ENCOUNTER — Ambulatory Visit
Admission: EM | Admit: 2021-08-22 | Discharge: 2021-08-22 | Discharge status: Absent without leave | Payer: Medicare Other

## 2021-08-22 ENCOUNTER — Encounter (HOSPITAL_BASED_OUTPATIENT_CLINIC_OR_DEPARTMENT_OTHER): Payer: Self-pay | Admitting: Obstetrics and Gynecology

## 2021-08-22 ENCOUNTER — Other Ambulatory Visit: Payer: Self-pay

## 2021-08-22 ENCOUNTER — Emergency Department (HOSPITAL_BASED_OUTPATIENT_CLINIC_OR_DEPARTMENT_OTHER): Payer: Medicare Other

## 2021-08-22 ENCOUNTER — Ambulatory Visit (HOSPITAL_COMMUNITY)
Admission: EM | Admit: 2021-08-22 | Discharge: 2021-08-22 | Disposition: A | Discharge status: Other Acute Inpatient Hospital | Payer: Medicare Other

## 2021-08-22 DIAGNOSIS — Z7901 Long term (current) use of anticoagulants: Secondary | ICD-10-CM | POA: Diagnosis not present

## 2021-08-22 DIAGNOSIS — Y92009 Unspecified place in unspecified non-institutional (private) residence as the place of occurrence of the external cause: Secondary | ICD-10-CM

## 2021-08-22 DIAGNOSIS — W06XXXA Fall from bed, initial encounter: Secondary | ICD-10-CM

## 2021-08-22 DIAGNOSIS — W19XXXA Unspecified fall, initial encounter: Secondary | ICD-10-CM | POA: Insufficient documentation

## 2021-08-22 DIAGNOSIS — S0081XA Abrasion of other part of head, initial encounter: Secondary | ICD-10-CM | POA: Diagnosis not present

## 2021-08-22 DIAGNOSIS — S0990XA Unspecified injury of head, initial encounter: Secondary | ICD-10-CM | POA: Diagnosis not present

## 2021-08-22 DIAGNOSIS — T148XXA Other injury of unspecified body region, initial encounter: Secondary | ICD-10-CM

## 2021-08-22 MED ORDER — SODIUM CHLORIDE 0.9 % IV SOLN: 1.0000 g | Freq: Once | INTRAVENOUS | Status: DC

## 2021-08-22 NOTE — ED Provider Notes (Signed)
[facility] EMERGENCY DEPT Provider Note   CSN: [id number] Arrival date & time: 08/22/21  1123     History  Chief Complaint  Patient presents with   Joe Cantu is a 71 y.o. male with past medical history significant for recurrent DVT, PE who is chronically anticoagulated on Xarelto who presents with concern for fall out of bed, hitting the left side of his head yesterday around 3 AM.  Patient reports that he did have some bleeding from the abrasion initially.  Patient denies any headache, dizziness, syncope, does have some soreness around the area.  Patient has not taken anything for pain at this time.  Patient was sent from urgent care for further evaluation given fall on anticoagulation.   Fall      Home Medications Prior to Admission medications   Medication Sig Start Date End Date Taking? Authorizing Provider  acetaminophen (TYLENOL) 325 MG tablet Take 325 mg by mouth every 12 (twelve) hours as needed for mild pain or headache.    [provider]  dorzolamide (TRUSOPT) 2 % ophthalmic solution Place 1 drop into both eyes 2 (two) times daily. 08/27/19   [provider]  magnesium hydroxide (MILK OF MAGNESIA) 400 MG/5ML suspension Take 15 mLs by mouth at bedtime as needed for mild constipation. 09/23/20   Wieters, Hallie C, PA-C  PROLENSA 0.07 % SOLN Apply 1 drop to eye as needed. 11/04/20   [provider]  tamsulosin (FLOMAX) 0.4 MG CAPS capsule Take 0.4 mg by mouth daily after breakfast. 10/26/18   [provider]  timolol (TIMOPTIC) 0.5 % ophthalmic solution Place 1 drop into both eyes in the morning and at bedtime. 08/27/19   [provider]  VYZULTA 0.024 % SOLN Place 1 drop into both eyes at bedtime.    [provider]  XARELTO 20 MG TABS tablet Take 1 tablet (20 mg total) by mouth daily with supper. 11/06/20   Yates Decamp, MD      Allergies    Patient has no known allergies.    Review of Systems    Review of Systems  Skin:  Positive for wound.  All other systems reviewed and are negative.  Physical Exam Updated Vital Signs BP 113/74    Pulse (!) 50    Temp 97.8 F (36.6 C)    Resp 16    Ht 6\' 3"  (1.905 m)    Wt 66.7 kg    SpO2 100%    BMI 18.37 kg/m  Physical Exam Vitals and nursing note reviewed.  Constitutional:      General: He is not in acute distress.    Appearance: Normal appearance.  HENT:     Head: Normocephalic and atraumatic.  Eyes:     General:        Right eye: No discharge.        Left eye: No discharge.  Cardiovascular:     Rate and Rhythm: Normal rate and regular rhythm.     Heart sounds: No murmur heard.   No friction rub. No gallop.  Pulmonary:     Effort: Pulmonary effort is normal.     Breath sounds: Normal breath sounds.  Abdominal:     General: Bowel sounds are normal.     Palpations: Abdomen is soft.  Skin:    General: Skin is warm and dry.     Capillary Refill: Capillary refill takes less than 2 seconds.     Comments: Small abrasion noted  left temple, not actively bleeding, no penetration into the deep dermis.  Neurological:     Mental Status: He is alert and oriented to person, place, and time.     Comments: Cranial nerves II through XII grossly intact.  Intact finger-nose, intact heel-to-shin.  Romberg negative, gait normal.  Alert and oriented x3.  Moves all 4 limbs spontaneously, normal coordination.  No pronator drift.  Intact strength 5 out of 5 bilateral upper and lower extremities.   Psychiatric:        Mood and Affect: Mood normal.        Behavior: Behavior normal.    ED Results / Procedures / Treatments   Labs (all labs ordered are listed, but only abnormal results are displayed) Labs Reviewed - No data to display  EKG None  Radiology CT Head Wo Contrast  Result Date: 08/22/2021 CLINICAL DATA:  Head injury after fall. EXAM: CT HEAD WITHOUT CONTRAST TECHNIQUE: Contiguous axial images were obtained from the base of the skull  through the vertex without intravenous contrast. RADIATION DOSE REDUCTION: This exam was performed according to the departmental dose-optimization program which includes automated exposure control, adjustment of the mA and/or kV according to patient size and/or use of iterative reconstruction technique. COMPARISON:  None. FINDINGS: Brain: No evidence of acute infarction, hemorrhage, hydrocephalus, extra-axial collection or mass lesion/mass effect. Vascular: No hyperdense vessel or unexpected calcification. Skull: Normal. Negative for fracture or focal lesion. Sinuses/Orbits: No acute finding. Other: None. IMPRESSION: No acute intracranial abnormality seen. Electronically Signed   By: Lupita Raider M.D.   On: 08/22/2021 12:34    Procedures Procedures    Medications Ordered in ED Medications - No data to display  ED Course/ Medical Decision Making/ A&P                           Medical Decision Making Amount and/or Complexity of Data Reviewed Radiology: ordered.  I discussed this case with my attending physician who cosigned this note including patient's presenting symptoms, physical exam, and planned diagnostics and interventions. Attending physician stated agreement with plan or made changes to plan which were implemented.   This is an overall well-appearing 71 year old male who has significant comorbidities of recurrent DVT, PE on chronic anticoagulation with Xarelto who comes in for concern of left head injury with abrasion, and concern for intracranial head injury.  Patient denies any ongoing confusion, dizziness, nausea, vomiting, did not have any loss of consciousness at the event, reports that he fell out of his bed due to extreme.  Emergent differential diagnosis includes acute intracranial bleed, diffuse axonal injury, other traumatic head injury.  This is not an exhaustive differential.  My physical exam was remarkable for no focal neurologic deficits.  Patient does have an abrasion  noted, no evidence of active bleeding, no laceration that needs repair.  His CT head without contrast shows no evidence of intracranial abnormality, intracranial head bleed.  I personally interpreted this imaging study, I agree with the radiologist interpretation.  Patient is well-appearing, no additional concerns, injuries, concern for postconcussive syndrome, or other trauma from the fall.  This was a ground-level fall.  At this time with negative imaging, nonfocal neurologic exam believe that patient is safe for discharge at this time, encouraged him to continue taking his blood thinner, follow-up with his primary care doctor.  Patient discharged in stable condition, return precautions given. Final Clinical Impression(s) / ED Diagnoses Final diagnoses:  Fall, initial encounter  Injury of head, initial encounter    Rx / DC Orders ED Discharge Orders     None         [place] 08/22/21 1422    Tegeler, Canary Brim, MD 08/22/21 [phone]

## 2021-08-22 NOTE — Discharge Instructions (Signed)
Because of your age and because you take a blood thinning medication we need to get a CT scan of your head since you had a head injury.  Please go to the emergency room and check in for evaluation as we discussed.

## 2021-08-22 NOTE — ED Provider Notes (Signed)
[facility]    CSN: [id number] Arrival date & time: 08/22/21  0941      History   Chief Complaint Chief Complaint  Patient presents with   Fall    HPI Joe Cantu is a 71 y.o. male.   Patient presents today unaccompanied.  He reports that yesterday morning he fell out of bed and hit the left side of his head.  He was sleeping at the time that he fell denies any known loss of consciousness.  He did have a small abrasion over his left temporal region that initially blood but has since improved.  He does report ongoing left-sided headache and scalp soreness.  Pain is rated 3 on a 0-10 pain scale, localized to left temporal region, described as aching, no aggravating relieving factors identified.  He denies any dizziness, syncope, nausea/vomiting.  He does take Xarelto regularly due to history of recurrent DVT and PE.  He denies additional areas of pain or soreness.  He is not taking any over-the-counter medication for symptom management.   Past Medical History:  Diagnosis Date   Aortic root dilatation ([place]) 02/21/2019   Dyspnea on exertion    Frequent unifocal PVCs 02/21/2019   Glaucoma    Gonorrhea    32 & 19 yrs of age; negative for syphillis, herpes and HIV   Left inguinal hernia    Pneumonia    Ventricular premature beats    Wears dentures     Patient Active Problem List   Diagnosis Date Noted   Presence of IVC filter 01/09/2020   Shortness of breath    Other secondary pulmonary hypertension ([place])    Transaminitis    Hemoptysis 09/10/2019   DVT (deep venous thrombosis) ([facility])    Syncope and collapse    Acute massive pulmonary embolism ([place]) 08/30/2019   Aortic root dilatation ([place]) 02/21/2019   Frequent unifocal PVCs 02/21/2019   Palpitations 09/19/2018   Inguinal hernia without mention of obstruction or gangrene, unilateral or unspecified, (not specified as recurrent)-right 11/24/2012    Past Surgical History:  Procedure Laterality Date   BRONCHIAL  WASHINGS  09/12/2019   Procedure: BRONCHIAL WASHINGS;  Surgeon: Marshell Garfinkel, MD;  Location: [facility];  Service: Cardiopulmonary;;   CATARACT EXTRACTION  02/2021   COLONOSCOPY  07/2018   Benign polyps    ESOPHAGOGASTRODUODENOSCOPY (EGD) WITH PROPOFOL N/A 09/15/2019   Procedure: ESOPHAGOGASTRODUODENOSCOPY (EGD) WITH PROPOFOL;  Surgeon: Carol Ada, MD;  Location: [facility];  Service: Endoscopy;  Laterality: N/A;   HIATAL HERNIA REPAIR     IR ANGIOGRAM PULMONARY BILATERAL SELECTIVE  08/30/2019   IR INFUSION THROMBOL ARTERIAL INITIAL (MS)  08/30/2019   IR INFUSION THROMBOL ARTERIAL INITIAL (MS)  08/30/2019   IR IVC FILTER PLMT / S&I /IMG GUID/MOD SED  08/31/2019   IR THROMB F/U EVAL ART/VEN FINAL DAY (MS)  08/31/2019   IR US GUIDE VASC ACCESS LEFT  08/30/2019   IR US GUIDE VASC ACCESS LEFT  08/30/2019   IVC FILTER REMOVAL N/A 01/09/2020   Procedure: IVC FILTER REMOVAL;  Surgeon: Adrian Prows, MD;  Location: [facility];  Service: Cardiovascular;  Laterality: N/A;   RIGHT HEART CATH N/A 01/09/2020   Procedure: RIGHT HEART CATH;  Surgeon: Adrian Prows, MD;  Location: [facility];  Service: Cardiovascular;  Laterality: N/A;   VIDEO BRONCHOSCOPY N/A 09/12/2019   Procedure: VIDEO BRONCHOSCOPY WITHOUT FLUORO;  Surgeon: Marshell Garfinkel, MD;  Location: [facility];  Service: Cardiopulmonary;  Laterality: N/A;  Home Medications    Prior to Admission medications   Medication Sig Start Date End Date Taking? Authorizing Provider  acetaminophen (TYLENOL) 325 MG tablet Take 325 mg by mouth every 12 (twelve) hours as needed for mild pain or headache.    [provider]  dorzolamide (TRUSOPT) 2 % ophthalmic solution Place 1 drop into both eyes 2 (two) times daily. 08/27/19   [provider]  magnesium hydroxide (MILK OF MAGNESIA) 400 MG/5ML suspension Take 15 mLs by mouth at bedtime as needed for mild constipation. 09/23/20   Wieters, Hallie C, PA-C  PROLENSA  0.07 % SOLN Apply 1 drop to eye as needed. 11/04/20   [provider]  tamsulosin (FLOMAX) 0.4 MG CAPS capsule Take 0.4 mg by mouth daily after breakfast. 10/26/18   [provider]  timolol (TIMOPTIC) 0.5 % ophthalmic solution Place 1 drop into both eyes in the morning and at bedtime. 08/27/19   [provider]  VYZULTA 0.024 % SOLN Place 1 drop into both eyes at bedtime.    [provider]  XARELTO 20 MG TABS tablet Take 1 tablet (20 mg total) by mouth daily with supper. 11/06/20   Adrian Prows, MD    Family History Family History  Problem Relation Age of Onset   Hypertension Mother    Diabetes Mother    Hypertension Father    Cancer Father        colon   Diabetes Sister    Diabetes Brother     Social History Social History   Tobacco Use   Smoking status: Former    Packs/day: 0.50    Years: 10.00    Pack years: 5.00    Types: Cigarettes    Quit date: 1970    Years since quitting: 53.1   Smokeless tobacco: Never  Vaping Use   Vaping Use: Never used  Substance Use Topics   Alcohol use: Not Currently    Comment: occasional wine   Drug use: No     Allergies   Patient has no known allergies.   Review of Systems Review of Systems  Constitutional:  Positive for activity change. Negative for appetite change, fatigue and fever.  Eyes:  Negative for photophobia and visual disturbance.  Respiratory:  Positive for shortness of breath (Chronic; unchanged from baseline). Negative for cough.   Cardiovascular:  Negative for chest pain.  Gastrointestinal:  Negative for abdominal pain, diarrhea, nausea and vomiting.  Musculoskeletal:  Negative for arthralgias and myalgias.  Skin:  Positive for wound (Abrasion).  Neurological:  Positive for headaches. Negative for dizziness, seizures, syncope, facial asymmetry, speech difficulty, weakness and light-headedness.    Physical Exam Triage Vital Signs ED Triage Vitals  Enc Vitals Group     BP  08/22/21 1005 117/71     Pulse Rate 08/22/21 1005 (!) 58     Resp 08/22/21 1005 14     Temp 08/22/21 1005 97.9 F (36.6 C)     Temp Source 08/22/21 1005 Oral     SpO2 08/22/21 1005 97 %     Weight --      Height --      Head Circumference --      Peak Flow --      Pain Score 08/22/21 1007 2     Pain Loc --      Pain Edu? --      Excl. in Power? --    No data found.  Updated Vital Signs BP 117/71 (BP Location: Left  Arm)    Pulse (!) 58    Temp 97.9 F (36.6 C) (Oral)    Resp 14    SpO2 97%   Visual Acuity Right Eye Distance:   Left Eye Distance:   Bilateral Distance:    Right Eye Near:   Left Eye Near:    Bilateral Near:     Physical Exam Vitals reviewed.  Constitutional:      General: He is awake.     Appearance: Normal appearance. He is well-developed. He is not ill-appearing.     Comments: Very pleasant male appears stated age no acute distress sitting comfortably in exam room  HENT:     Head: Normocephalic. Abrasion present. No raccoon eyes, Battle's sign or contusion.      Right Ear: Tympanic membrane, ear canal and external ear normal. No hemotympanum. Tympanic membrane is not erythematous or bulging.     Left Ear: Tympanic membrane, ear canal and external ear normal. No hemotympanum. Tympanic membrane is not erythematous or bulging.     Nose: Nose normal.     Mouth/Throat:     Pharynx: Uvula midline. No oropharyngeal exudate or posterior oropharyngeal erythema.  Eyes:     Extraocular Movements: Extraocular movements intact.     Conjunctiva/sclera: Conjunctivae normal.     Pupils: Pupils are equal, round, and reactive to light.     Comments: Arcus senilis bilaterally  Cardiovascular:     Rate and Rhythm: Normal rate and regular rhythm.     Heart sounds: Normal heart sounds, S1 normal and S2 normal. No murmur heard. Pulmonary:     Effort: Pulmonary effort is normal. No accessory muscle usage or respiratory distress.     Breath sounds: Normal breath sounds. No  stridor. No wheezing, rhonchi or rales.     Comments: Clear to auscultation bilaterally Musculoskeletal:     Comments: Strength 5/5 bilateral upper and lower extremities  Neurological:     General: No focal deficit present.     Mental Status: He is alert and oriented to person, place, and time.     Cranial Nerves: Cranial nerves 2-12 are intact.     Motor: Motor function is intact.     Coordination: Coordination is intact. Romberg sign negative.     Gait: Gait is intact.     Comments: Patient neurovascularly intact.  Psychiatric:        Behavior: Behavior is cooperative.     UC Treatments / Results  Labs (all labs ordered are listed, but only abnormal results are displayed) Labs Reviewed - No data to display  EKG   Radiology No results found.  Procedures Procedures (including critical care time)  Medications Ordered in UC Medications - No data to display  Initial Impression / Assessment and Plan / UC Course  I have reviewed the triage vital signs and the nursing notes.  Pertinent labs & imaging results that were available during my care of the patient were reviewed by me and considered in my medical decision making (see chart for details).     Patient is well-appearing on exam today, however, based on [place] CT rules given he is above 17 years old and takes chronic anticoagulation he would need head CT following recent head injury.  Discussed that we do not have imaging capabilities in urgent care and recommended that he go to the emergency room.  Patient is agreeable to this and will have sister, who drove him today, take him directly to the emergency room following visit.  Vital signs were stable at the time of discharge and he was safe for private transport.  Final Clinical Impressions(s) / UC Diagnoses   Final diagnoses:  Fall in home, initial encounter  Abrasion  Injury of head, initial encounter     Discharge Instructions      Because of your age and  because you take a blood thinning medication we need to get a CT scan of your head since you had a head injury.  Please go to the emergency room and check in for evaluation as we discussed.     ED Prescriptions   None    PDMP not reviewed this encounter.   Terrilee Croak, PA-C 08/22/21 1056

## 2021-08-22 NOTE — ED Triage Notes (Signed)
Pt presents with pain left side head pain after falling out of his bed yesterday morning. Pt denies LOC following the fall. Pt states he believed he was dreaming when he fell out of bed and states he either hit his head on the dresser or bed rail.

## 2021-08-22 NOTE — ED Triage Notes (Signed)
Patient reports to the ER from UC. Patient reports yesterday morning at 0300 he was having a dream and fell off the bed and hit his head. Patient is taking Xarelto, UC sent him for a head CT to r/o bleeding on brain

## 2021-08-22 NOTE — ED Notes (Signed)
Patient verbalizes understanding of discharge instructions. Opportunity for questioning and answers were provided. Armband removed by staff, pt discharged from ED. Pt. ambulatory and discharged home.  

## 2021-08-22 NOTE — Discharge Instructions (Signed)
As we discussed your work-up today showed no evidence of any bleeding inside your head.  You do not have any additional concerns based on your description, and your physical exam today.  I recommend that you follow-up with your primary care doctor, continue taking her blood thinner, you can take some Tylenol if you have any lingering pain after the fall.

## 2021-08-25 NOTE — Progress Notes (Signed)
No ascending aortic aneurysm. Correlated with CT chest findings in 2022 (No AAA).

## 2021-09-12 ENCOUNTER — Encounter: Payer: Self-pay | Admitting: Podiatry

## 2021-09-12 ENCOUNTER — Ambulatory Visit: Payer: Medicare Other | Admitting: Podiatry

## 2021-09-12 ENCOUNTER — Other Ambulatory Visit: Payer: Self-pay

## 2021-09-12 DIAGNOSIS — B351 Tinea unguium: Secondary | ICD-10-CM

## 2021-09-12 DIAGNOSIS — M2041 Other hammer toe(s) (acquired), right foot: Secondary | ICD-10-CM

## 2021-09-12 DIAGNOSIS — M2142 Flat foot [pes planus] (acquired), left foot: Secondary | ICD-10-CM | POA: Diagnosis not present

## 2021-09-12 DIAGNOSIS — M79675 Pain in left toe(s): Secondary | ICD-10-CM | POA: Diagnosis not present

## 2021-09-12 DIAGNOSIS — M2141 Flat foot [pes planus] (acquired), right foot: Secondary | ICD-10-CM

## 2021-09-12 DIAGNOSIS — L853 Xerosis cutis: Secondary | ICD-10-CM | POA: Diagnosis not present

## 2021-09-12 DIAGNOSIS — M79674 Pain in right toe(s): Secondary | ICD-10-CM | POA: Diagnosis not present

## 2021-09-12 DIAGNOSIS — D689 Coagulation defect, unspecified: Secondary | ICD-10-CM | POA: Diagnosis not present

## 2021-09-12 DIAGNOSIS — M2042 Other hammer toe(s) (acquired), left foot: Secondary | ICD-10-CM

## 2021-09-12 NOTE — Patient Instructions (Signed)
Choose a moisturizer from  the list below:  For normal skin: Moisturize feet once daily; do not apply between toes A.  CeraVe Daily Moisturizing Lotion B.  Lubriderm Advanced Therapy Lotion or Lubriderm Intense Skin Repair Lotion C.  Vaseline Intensive Care Lotion D.  Gold Bond Ultimate Diabetic Foot Lotion E.  Eucerin Intensive Repair Moisturizing Lotion  For extremely dry, cracked feet: moisturize feet once daily; do not apply between toes A. CeraVe Healing Ointment B. Eucerin Aquaphor Repairing Ointment (may be labeled Aquaphor Healing Ointment) C. Vaseline Petroleum Healing Jelly   If you have problems reaching your feet: apply to feet once daily; do not apply between toes A.  Eucerin Aquaphor Ointment Body Spray  B.  Vaseline Intensive Care Spray Moisturizer (Unscented,  [place] Radiant Spray or Aloe Smooth Spray)    

## 2021-09-21 NOTE — Progress Notes (Signed)
Subjective: ?Joe Cantu presents today referred by [name], MD for complaint of painful elongated mycotic toenails 1-5 bilaterally which are tender when wearing enclosed shoe gear. Patient did attempt to cut his toenails and cut the left great toe. ? ?Past Medical History:  ?Diagnosis Date  ? Aortic root dilatation (HCC) 02/21/2019  ? Dyspnea on exertion   ? Frequent unifocal PVCs 02/21/2019  ? Glaucoma   ? Gonorrhea   ? 71 & 19 yrs of age; negative for syphillis, herpes and HIV  ? Left inguinal hernia   ? Pneumonia   ? Ventricular premature beats   ? Wears dentures   ?  ? ?Patient Active Problem List  ? Diagnosis Date Noted  ? Benign prostatic hyperplasia with incomplete bladder emptying 08/08/2021  ? Benign prostatic hyperplasia with urinary frequency 07/31/2021  ? Nephrolithiasis 07/31/2021  ? Disorder of prostate 03/13/2021  ? Glaucoma 03/13/2021  ? Pulmonary embolism (HCC) 03/13/2021  ? Presence of IVC filter 01/09/2020  ? Shortness of breath   ? Other secondary pulmonary hypertension (HCC)   ? Transaminitis   ? Hemoptysis 09/10/2019  ? DVT (deep venous thrombosis) (HCC)   ? Syncope and collapse   ? Acute massive pulmonary embolism (HCC) 08/30/2019  ? Aortic root dilatation (HCC) 02/21/2019  ? Frequent unifocal PVCs 02/21/2019  ? Palpitations 09/19/2018  ? Inguinal hernia without mention of obstruction or gangrene, unilateral or unspecified, (not specified as recurrent)-right 11/24/2012  ?  ? ?Past Surgical History:  ?Procedure Laterality Date  ? BRONCHIAL WASHINGS  09/12/2019  ? Procedure: BRONCHIAL WASHINGS;  Surgeon: Chilton Greathouse, MD;  Location: [facility];  Service: Cardiopulmonary;;  ? CATARACT EXTRACTION  02/2021  ? COLONOSCOPY  07/2018  ? Benign polyps   ? ESOPHAGOGASTRODUODENOSCOPY (EGD) WITH PROPOFOL N/A 09/15/2019  ? Procedure: ESOPHAGOGASTRODUODENOSCOPY (EGD) WITH PROPOFOL;  Surgeon: Jeani Hawking, MD;  Location: [facility];  Service: Endoscopy;  Laterality: N/A;  ? HIATAL HERNIA REPAIR     ? IR ANGIOGRAM PULMONARY BILATERAL SELECTIVE  08/30/2019  ? IR INFUSION THROMBOL ARTERIAL INITIAL (MS)  08/30/2019  ? IR INFUSION THROMBOL ARTERIAL INITIAL (MS)  08/30/2019  ? IR IVC FILTER PLMT / S&I /IMG GUID/MOD SED  08/31/2019  ? IR THROMB F/U EVAL ART/VEN FINAL DAY (MS)  08/31/2019  ? IR US GUIDE VASC ACCESS LEFT  08/30/2019  ? IR US GUIDE VASC ACCESS LEFT  08/30/2019  ? IVC FILTER REMOVAL N/A 01/09/2020  ? Procedure: IVC FILTER REMOVAL;  Surgeon: Yates Decamp, MD;  Location: [facility];  Service: Cardiovascular;  Laterality: N/A;  ? RIGHT HEART CATH N/A 01/09/2020  ? Procedure: RIGHT HEART CATH;  Surgeon: Yates Decamp, MD;  Location: [facility] INVASIVE CV LAB;  Service: Cardiovascular;  Laterality: N/A;  ? VIDEO BRONCHOSCOPY N/A 09/12/2019  ? Procedure: VIDEO BRONCHOSCOPY WITHOUT FLUORO;  Surgeon: Chilton Greathouse, MD;  Location: [facility];  Service: Cardiopulmonary;  Laterality: N/A;  ?  ? ?Current Outpatient Medications on File Prior to Visit  ?Medication Sig Dispense Refill  ? acetaminophen (TYLENOL) 325 MG tablet Take 325 mg by mouth every 12 (twelve) hours as needed for mild pain or headache.    ? citalopram (CELEXA) 10 MG tablet Take by mouth.    ? dorzolamide (TRUSOPT) 2 % ophthalmic solution Place 1 drop into both eyes 2 (two) times daily.    ? dorzolamide-timolol (COSOPT) 22.3-6.8 MG/ML ophthalmic solution 1 drop 2 (two) times daily.    ? eszopiclone (LUNESTA) 2 MG TABS tablet Take 2 mg by  mouth at bedtime.    ? latanoprost (XALATAN) 0.005 % ophthalmic solution SMARTSIG:In Eye(s)    ? magnesium hydroxide (MILK OF MAGNESIA) 400 MG/5ML suspension Take 15 mLs by mouth at bedtime as needed for mild constipation. 355 mL 0  ? PROLENSA 0.07 % SOLN Apply 1 drop to eye as needed.    ? SYSTANE ULTRA 0.4-0.3 % SOLN Apply 1 drop to eye daily.    ? tamsulosin (FLOMAX) 0.4 MG CAPS capsule Take 0.4 mg by mouth daily after breakfast.    ? timolol (TIMOPTIC) 0.5 % ophthalmic solution Place 1 drop into both eyes in  the morning and at bedtime.    ? VYZULTA 0.024 % SOLN Place 1 drop into both eyes at bedtime.    ? XARELTO 20 MG TABS tablet Take 1 tablet (20 mg total) by mouth daily with supper. 90 tablet 1  ? ?No current facility-administered medications on file prior to visit.  ?  ? ?No Known Allergies  ? ?Social History  ? ?Occupational History  ? Not on file  ?Tobacco Use  ? Smoking status: Former  ?  Packs/day: 0.50  ?  Years: 10.00  ?  Pack years: 5.00  ?  Types: Cigarettes  ?  Quit date: 1970  ?  Years since quitting: 53.2  ? Smokeless tobacco: Never  ?Vaping Use  ? Vaping Use: Never used  ?Substance and Sexual Activity  ? Alcohol use: Not Currently  ?  Comment: occasional wine  ? Drug use: No  ? Sexual activity: Not on file  ?  ? ?Family History  ?Problem Relation Age of Onset  ? Hypertension Mother   ? Diabetes Mother   ? Hypertension Father   ? Cancer Father   ?     colon  ? Diabetes Sister   ? Diabetes Brother   ?  ? ?Immunization History  ?Administered Date(s) Administered  ? Moderna SARS-COV2 Booster Vaccination 05/14/2020  ? Moderna Sars-Covid-2 Vaccination 09/04/2019, 10/03/2019  ? Pneumococcal Conjugate-13 05/13/2019  ?  ? ?Objective: ?Joe Cantu is a pleasant 71 y.o. male WD, WN in NAD. AAO x 3. ? ?There were no vitals filed for this visit. ? ?Vascular Examination:  ?CFT <3 seconds b/l LE. Palpable DP/PT pulses b/l LE. Digital hair sparse b/l. Skin temperature gradient WNL b/l. No pain with calf compression b/l. No edema noted b/l. No cyanosis or clubbing noted b/l LE. ? ?Dermatological Examination: ?Pedal integument with normal turgor, texture and tone BLE. No open wounds b/l LE. Evidence of self-inflicted laceration of left great toe. There is dried heme. No erythema, no edema, no drainage, no fluctuance. No interdigital macerations noted b/l LE. Toenails 1-5 b/l elongated, discolored, dystrophic, thickened, crumbly with subungual debris and tenderness to dorsal palpation. No hyperkeratotic nor porokeratotic  lesions present on today's visit. Pedal skin dry and cracked b/l lower extremities. ? ?Musculoskeletal: ?Normal muscle strength 5/5 to all lower extremity muscle groups bilaterally. Hammertoe deformity noted 2-5 b/l. Pes planus deformity noted bilateral LE.Marland Kitchen No pain, crepitus or joint limitation noted with ROM b/l LE.  Patient ambulates independently without assistive aids. ? ?Neurological: ?Protective sensation intact 5/5 intact bilaterally with 10g monofilament b/l. Vibratory sensation intact b/l. ? ?A1c:  No flowsheet data found. ? ?Assessment: ?1. Pain due to onychomycosis of toenails of both feet   ?2. Acquired hammertoes of both feet   ?3. Pes planus of both feet   ?4. Xerosis cutis   ?5. Clotting disorder (HCC)   ?  ?Plan: ?-  Examined patient. ?-Patient to continue soft, supportive shoe gear daily. ?-Discussed topical, laser and oral medication for onychomycosis. Patient opted for toenail debridement only on today.  ?-Mycotic toenails 1-5 bilaterally were debrided in length and girth with sterile nail nippers and dremel without incident. ?-For dry skin, patient was given written list of OTC moisturizers. Patient/POA instructed to apply to foot/feet once daily avoiding application between toes.  ?-Patient/POA to call should there be question/concern in the interim. ? ?Return in about 3 months (around 12/13/2021). ? ?Freddie BreechJennifer L Kou Gucciardo, DPM ?

## 2021-09-25 DIAGNOSIS — R339 Retention of urine, unspecified: Secondary | ICD-10-CM | POA: Diagnosis not present

## 2021-09-25 DIAGNOSIS — R634 Abnormal weight loss: Secondary | ICD-10-CM | POA: Diagnosis not present

## 2021-09-25 DIAGNOSIS — E559 Vitamin D deficiency, unspecified: Secondary | ICD-10-CM | POA: Diagnosis not present

## 2021-10-30 DIAGNOSIS — F32A Depression, unspecified: Secondary | ICD-10-CM | POA: Diagnosis not present

## 2021-10-30 DIAGNOSIS — R634 Abnormal weight loss: Secondary | ICD-10-CM | POA: Diagnosis not present

## 2021-12-03 DIAGNOSIS — Z0000 Encounter for general adult medical examination without abnormal findings: Secondary | ICD-10-CM | POA: Diagnosis not present

## 2021-12-03 DIAGNOSIS — R634 Abnormal weight loss: Secondary | ICD-10-CM | POA: Diagnosis not present

## 2021-12-03 DIAGNOSIS — R350 Frequency of micturition: Secondary | ICD-10-CM | POA: Diagnosis not present

## 2021-12-03 DIAGNOSIS — F32A Depression, unspecified: Secondary | ICD-10-CM | POA: Diagnosis not present

## 2021-12-10 IMAGING — CR DG RIBS W/ CHEST 3+V*R*
5 series · 5 of 5 positions shown · non-contrast
Comparison: 09/13/2019 chest radiograph.

CLINICAL DATA: Fall earlier today with T6 level posterior right rib
pain

EXAM:
RIGHT RIBS AND CHEST - 3+ VIEW

[w chest pa]
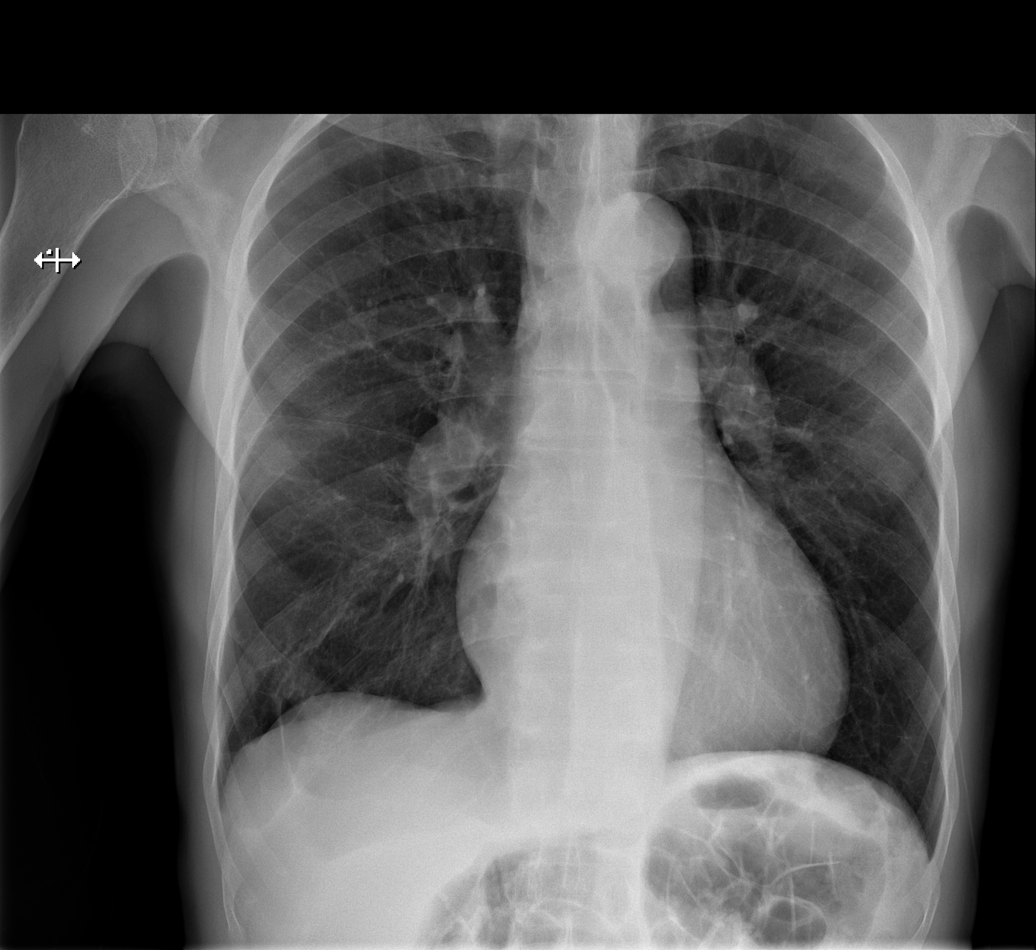

[w ribs ap upper right]
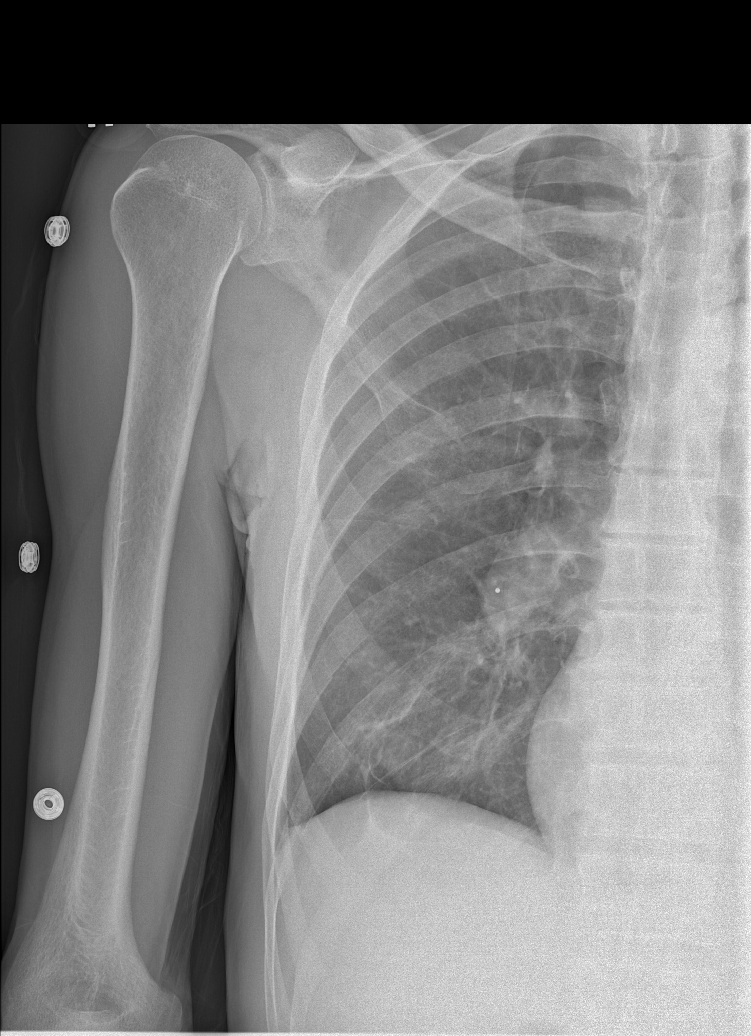

[w ribs ap lower right]
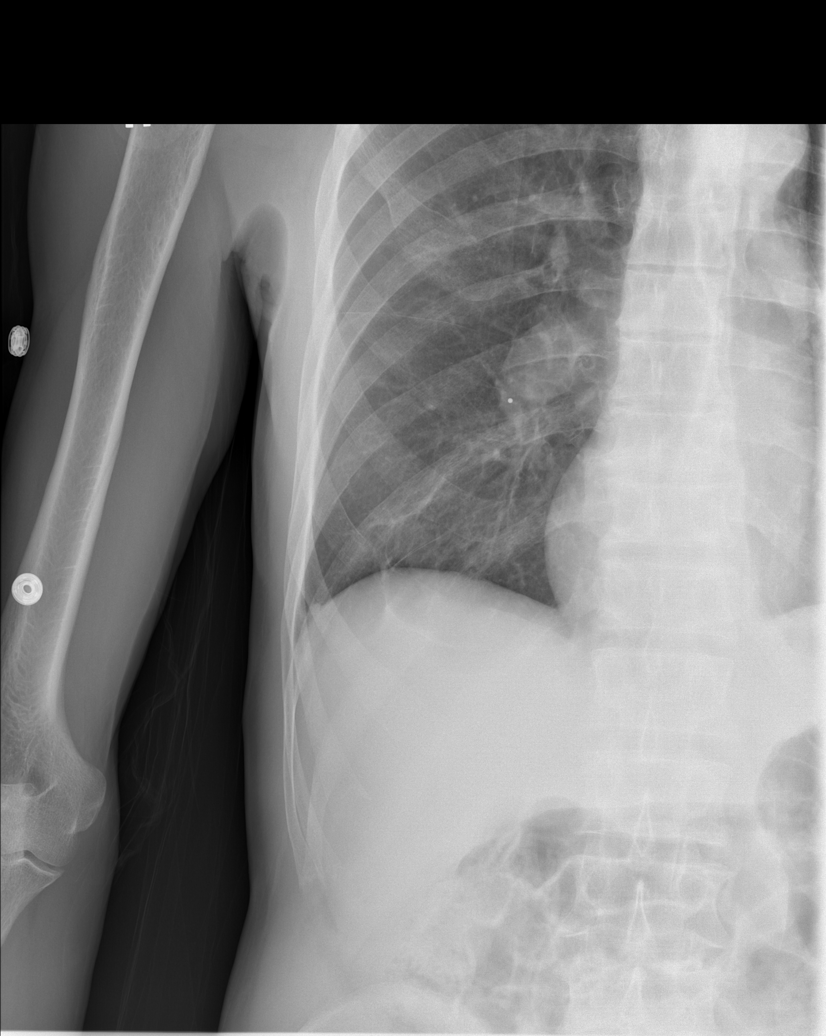

[w ribs obl right (1 of 2)]
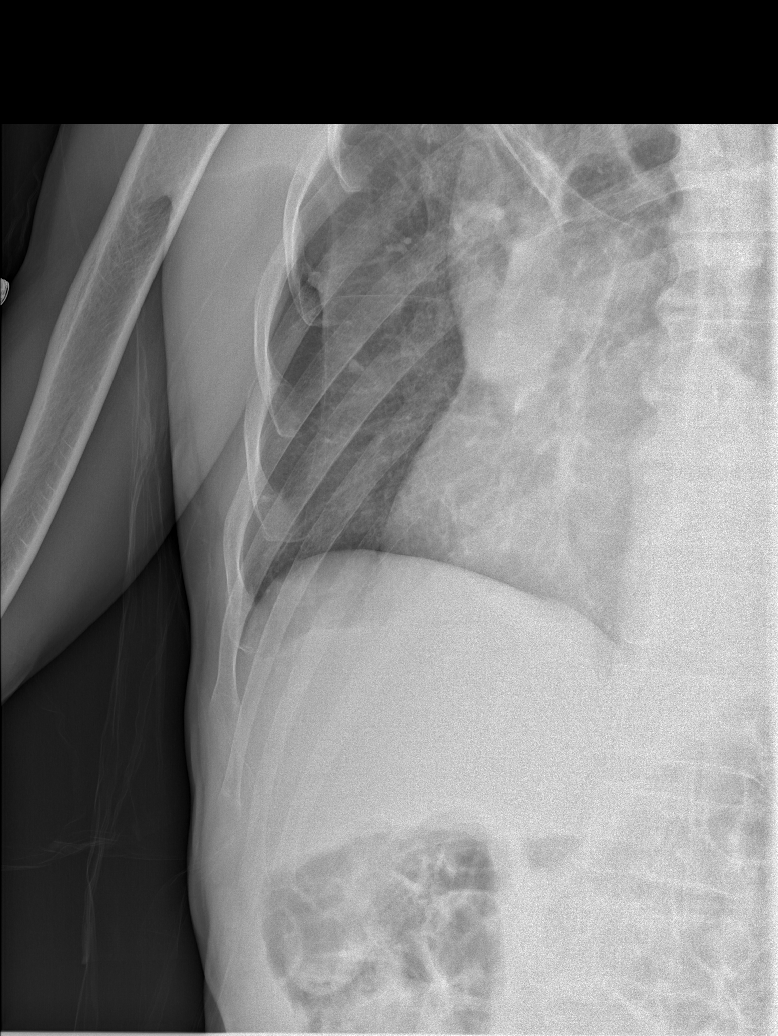

[w ribs obl right (2 of 2)]
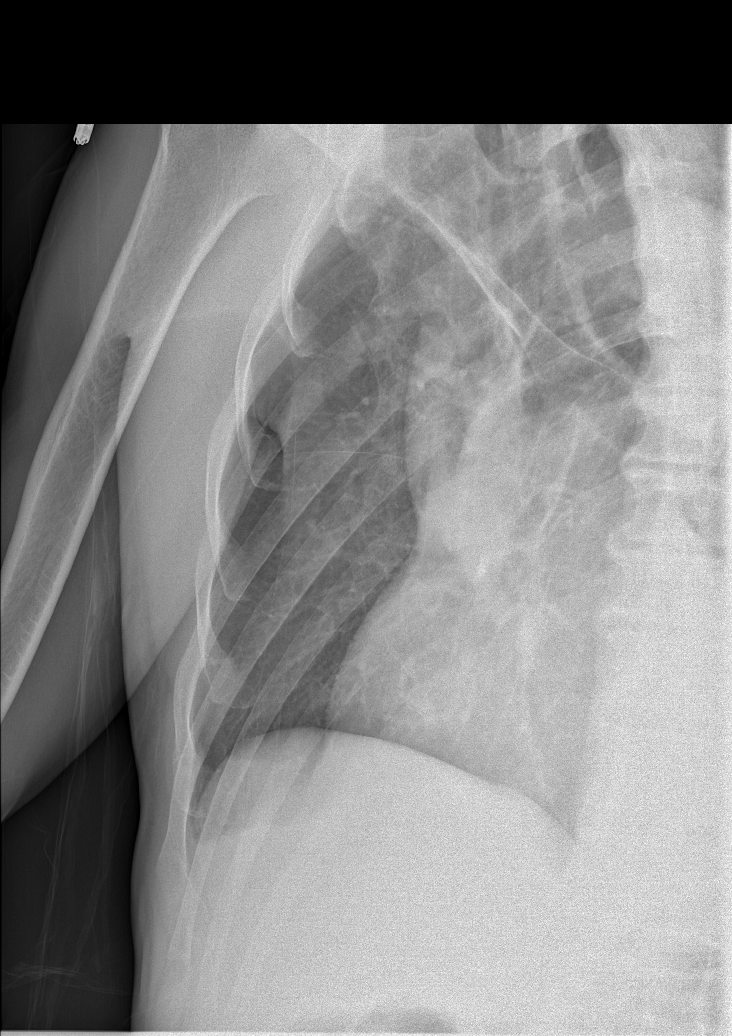

[5 of 5 positions shown; findings below may reference images not displayed]

FINDINGS: Stable cardiomediastinal silhouette with normal heart size. No
pneumothorax. No pleural effusion. Minimal linear scarring at the
right lung base. No pulmonary edema. No acute consolidative airspace
disease. The area of symptomatic concern as indicated by the patient
in the posterior lower right chest wall was denoted with a metallic
skin BB by the technologist. Suggestion of a subtle nondisplaced
posterolateral right ninth rib fracture. No additional rib fracture.
No focal osseous lesions.
IMPRESSION: Suggestion of a subtle nondisplaced posterolateral right ninth rib
fracture. No pneumothorax. No active pulmonary disease.

## 2021-12-16 ENCOUNTER — Encounter: Payer: Self-pay | Admitting: Podiatry

## 2021-12-16 ENCOUNTER — Ambulatory Visit: Payer: Medicare Other | Admitting: Podiatry

## 2021-12-16 DIAGNOSIS — M79674 Pain in right toe(s): Secondary | ICD-10-CM | POA: Diagnosis not present

## 2021-12-16 DIAGNOSIS — M79675 Pain in left toe(s): Secondary | ICD-10-CM

## 2021-12-16 DIAGNOSIS — D689 Coagulation defect, unspecified: Secondary | ICD-10-CM

## 2021-12-16 DIAGNOSIS — B351 Tinea unguium: Secondary | ICD-10-CM | POA: Diagnosis not present

## 2021-12-21 NOTE — Progress Notes (Signed)
  Subjective:  Patient ID: Joe Cantu, male    DOB: 12/22/1950,  MRN: [id number]  Joe Cantu presents to clinic today for at risk foot care with h/o clotting disorder and painful elongated mycotic toenails 1-5 bilaterally which are tender when wearing enclosed shoe gear. Pain is relieved with periodic professional debridement.  New problem(s): None.   PCP is Renaye Rakers, MD , and last visit was August 14, 2021.  No Known Allergies  Review of Systems: Negative except as noted in the HPI.  Objective: No changes noted in today's physical examination. Joe Cantu is a pleasant 71 y.o. male WD, WN in NAD. AAO x 3.  There were no vitals filed for this visit.  Vascular Examination:  CFT <3 seconds b/l LE. Palpable DP/PT pulses b/l LE. Digital hair sparse b/l. Skin temperature gradient WNL b/l. No pain with calf compression b/l. No edema noted b/l. No cyanosis or clubbing noted b/l LE.  Dermatological Examination: Pedal integument with normal turgor, texture and tone BLE. No open wounds b/l LE. No interdigital macerations noted b/l LE. Toenails 1-5 b/l elongated, discolored, dystrophic, thickened, crumbly with subungual debris and tenderness to dorsal palpation. No hyperkeratotic nor porokeratotic lesions present on today's visit. Pedal skin dry and cracked b/l lower extremities.  Musculoskeletal: Normal muscle strength 5/5 to all lower extremity muscle groups bilaterally. Hammertoe deformity noted 2-5 b/l. Pes planus deformity noted bilateral LE.Marland Kitchen No pain, crepitus or joint limitation noted with ROM b/l LE.  Patient ambulates independently without assistive aids.  Neurological: Protective sensation intact 5/5 intact bilaterally with 10g monofilament b/l. Vibratory sensation intact b/l.  Assessment/Plan: 1. Pain due to onychomycosis of toenails of both feet   2. Clotting disorder [name])     -Patient was evaluated and treated. All patient's and/or POA's questions/concerns answered on  today's visit. -Patient to continue soft, supportive shoe gear daily. -Toenails 1-5 b/l were debrided in length and girth with sterile nail nippers and dremel without iatrogenic bleeding.  -Patient/POA to call should there be question/concern in the interim.   Return in about 3 months (around 03/18/2022).  Freddie Breech, DPM

## 2021-12-24 DIAGNOSIS — R350 Frequency of micturition: Secondary | ICD-10-CM | POA: Diagnosis not present

## 2021-12-25 DIAGNOSIS — H18413 Arcus senilis, bilateral: Secondary | ICD-10-CM | POA: Diagnosis not present

## 2021-12-25 DIAGNOSIS — H04551 Acquired stenosis of right nasolacrimal duct: Secondary | ICD-10-CM | POA: Diagnosis not present

## 2021-12-25 DIAGNOSIS — Z961 Presence of intraocular lens: Secondary | ICD-10-CM | POA: Diagnosis not present

## 2021-12-25 DIAGNOSIS — H401131 Primary open-angle glaucoma, bilateral, mild stage: Secondary | ICD-10-CM | POA: Diagnosis not present

## 2022-01-15 DIAGNOSIS — F32A Depression, unspecified: Secondary | ICD-10-CM | POA: Diagnosis not present

## 2022-01-15 DIAGNOSIS — D643 Other sideroblastic anemias: Secondary | ICD-10-CM | POA: Diagnosis not present

## 2022-01-15 DIAGNOSIS — R634 Abnormal weight loss: Secondary | ICD-10-CM | POA: Diagnosis not present

## 2022-02-03 ENCOUNTER — Telehealth: Payer: Self-pay

## 2022-02-06 DIAGNOSIS — N281 Cyst of kidney, acquired: Secondary | ICD-10-CM | POA: Diagnosis not present

## 2022-02-06 DIAGNOSIS — Z86718 Personal history of other venous thrombosis and embolism: Secondary | ICD-10-CM | POA: Insufficient documentation

## 2022-02-06 DIAGNOSIS — N200 Calculus of kidney: Secondary | ICD-10-CM | POA: Diagnosis not present

## 2022-02-06 DIAGNOSIS — Z86711 Personal history of pulmonary embolism: Secondary | ICD-10-CM | POA: Insufficient documentation

## 2022-02-06 DIAGNOSIS — R350 Frequency of micturition: Secondary | ICD-10-CM | POA: Diagnosis not present

## 2022-02-09 DIAGNOSIS — F32A Depression, unspecified: Secondary | ICD-10-CM | POA: Diagnosis not present

## 2022-02-09 DIAGNOSIS — E785 Hyperlipidemia, unspecified: Secondary | ICD-10-CM | POA: Diagnosis not present

## 2022-02-16 DIAGNOSIS — E785 Hyperlipidemia, unspecified: Secondary | ICD-10-CM | POA: Diagnosis not present

## 2022-02-16 DIAGNOSIS — F32A Depression, unspecified: Secondary | ICD-10-CM | POA: Diagnosis not present

## 2022-02-19 DIAGNOSIS — H16143 Punctate keratitis, bilateral: Secondary | ICD-10-CM | POA: Diagnosis not present

## 2022-03-02 DIAGNOSIS — E785 Hyperlipidemia, unspecified: Secondary | ICD-10-CM | POA: Diagnosis not present

## 2022-03-02 DIAGNOSIS — Z7901 Long term (current) use of anticoagulants: Secondary | ICD-10-CM | POA: Diagnosis not present

## 2022-03-02 DIAGNOSIS — F32A Depression, unspecified: Secondary | ICD-10-CM | POA: Diagnosis not present

## 2022-03-02 DIAGNOSIS — R634 Abnormal weight loss: Secondary | ICD-10-CM | POA: Diagnosis not present

## 2022-03-03 DIAGNOSIS — H16223 Keratoconjunctivitis sicca, not specified as Sjogren's, bilateral: Secondary | ICD-10-CM | POA: Diagnosis not present

## 2022-03-03 DIAGNOSIS — H04123 Dry eye syndrome of bilateral lacrimal glands: Secondary | ICD-10-CM | POA: Diagnosis not present

## 2022-03-10 DIAGNOSIS — H04123 Dry eye syndrome of bilateral lacrimal glands: Secondary | ICD-10-CM | POA: Diagnosis not present

## 2022-03-10 DIAGNOSIS — H16223 Keratoconjunctivitis sicca, not specified as Sjogren's, bilateral: Secondary | ICD-10-CM | POA: Diagnosis not present

## 2022-03-12 DIAGNOSIS — E785 Hyperlipidemia, unspecified: Secondary | ICD-10-CM | POA: Diagnosis not present

## 2022-03-12 DIAGNOSIS — F32A Depression, unspecified: Secondary | ICD-10-CM | POA: Diagnosis not present

## 2022-03-17 DIAGNOSIS — H16143 Punctate keratitis, bilateral: Secondary | ICD-10-CM | POA: Diagnosis not present

## 2022-03-17 DIAGNOSIS — H524 Presbyopia: Secondary | ICD-10-CM | POA: Diagnosis not present

## 2022-03-25 ENCOUNTER — Encounter: Payer: Self-pay | Admitting: Podiatry

## 2022-03-25 ENCOUNTER — Ambulatory Visit: Payer: Medicare Other | Admitting: Podiatry

## 2022-03-25 DIAGNOSIS — M79674 Pain in right toe(s): Secondary | ICD-10-CM

## 2022-03-25 DIAGNOSIS — M79675 Pain in left toe(s): Secondary | ICD-10-CM

## 2022-03-25 DIAGNOSIS — D689 Coagulation defect, unspecified: Secondary | ICD-10-CM | POA: Diagnosis not present

## 2022-03-25 DIAGNOSIS — B351 Tinea unguium: Secondary | ICD-10-CM | POA: Diagnosis not present

## 2022-03-29 NOTE — Progress Notes (Signed)
  Subjective:  Patient ID: Joe Cantu, male    DOB: 1950-11-19,  MRN: [id number]  Joe Cantu presents to clinic today for at risk foot care with h/o clotting disorder and painful thick toenails that are difficult to trim. Pain interferes with ambulation. Aggravating factors include wearing enclosed shoe gear. Pain is relieved with periodic professional debridement.  PCP is Lucianne Lei, MD , and last visit was Dec 03, 2021.  No Known Allergies  Review of Systems: Negative except as noted in the HPI.  Objective: No changes noted in today's physical examination. Joe Cantu is a pleasant 71 y.o. AAM, WD, WN in NAD. AAO x 3.  Vascular Examination: CFT <3 seconds b/l LE. Palpable pedal pulses b/l LE. Pedal hair sparse. No pain with calf compression b/l. No edema noted b/l LE. No ischemia or gangrene noted b/l LE. No cyanosis or clubbing noted b/l LE.Marland Kitchen  Dermatological Examination: Pedal skin with normal turgor, texture and tone b/l. No open wounds. No interdigital macerations b/l. Toenails 1-5 b/l thickened, discolored, dystrophic with subungual debris. There is pain on palpation to dorsal aspect of nailplates. No hyperkeratotic nor porokeratotic lesions present on today's visit.Marland Kitchen  Neurological Examination: Protective sensation intact with 10 gram monofilament b/l LE. Vibratory sensation intact b/l LE.   Musculoskeletal Examination: Muscle strength 5/5 to all LE muscle groups b/l. Hammertoe deformity noted 2-5 b/l. Pes planus deformity noted bilateral LE. Patient ambulates independent of any assistive aids.  Assessment/Plan: 1. Pain due to onychomycosis of toenails of both feet   2. Clotting disorder ([place])   -Examined patient. -No new findings. No new orders. -Mycotic toenails 1-5 bilaterally were debrided in length and girth with sterile nail nippers and dremel without incident. -Patient/POA to call should there be question/concern in the interim.   Return in about 3 months (around  06/24/2022).  Marzetta Board, DPM

## 2022-04-01 DIAGNOSIS — H16223 Keratoconjunctivitis sicca, not specified as Sjogren's, bilateral: Secondary | ICD-10-CM | POA: Diagnosis not present

## 2022-04-09 DIAGNOSIS — R634 Abnormal weight loss: Secondary | ICD-10-CM | POA: Diagnosis not present

## 2022-04-09 DIAGNOSIS — I82512 Chronic embolism and thrombosis of left femoral vein: Secondary | ICD-10-CM | POA: Diagnosis not present

## 2022-04-23 DIAGNOSIS — H26492 Other secondary cataract, left eye: Secondary | ICD-10-CM | POA: Diagnosis not present

## 2022-04-23 DIAGNOSIS — H401131 Primary open-angle glaucoma, bilateral, mild stage: Secondary | ICD-10-CM | POA: Diagnosis not present

## 2022-04-23 DIAGNOSIS — H26493 Other secondary cataract, bilateral: Secondary | ICD-10-CM | POA: Diagnosis not present

## 2022-04-23 DIAGNOSIS — H18413 Arcus senilis, bilateral: Secondary | ICD-10-CM | POA: Diagnosis not present

## 2022-04-23 DIAGNOSIS — Z961 Presence of intraocular lens: Secondary | ICD-10-CM | POA: Diagnosis not present

## 2022-05-12 DIAGNOSIS — H16223 Keratoconjunctivitis sicca, not specified as Sjogren's, bilateral: Secondary | ICD-10-CM | POA: Diagnosis not present

## 2022-05-21 DIAGNOSIS — H18893 Other specified disorders of cornea, bilateral: Secondary | ICD-10-CM | POA: Diagnosis not present

## 2022-05-22 DIAGNOSIS — K5904 Chronic idiopathic constipation: Secondary | ICD-10-CM | POA: Diagnosis not present

## 2022-05-22 DIAGNOSIS — E785 Hyperlipidemia, unspecified: Secondary | ICD-10-CM | POA: Diagnosis not present

## 2022-05-22 DIAGNOSIS — R634 Abnormal weight loss: Secondary | ICD-10-CM | POA: Diagnosis not present

## 2022-05-29 DIAGNOSIS — Z681 Body mass index (BMI) 19 or less, adult: Secondary | ICD-10-CM | POA: Diagnosis not present

## 2022-05-29 DIAGNOSIS — H538 Other visual disturbances: Secondary | ICD-10-CM | POA: Diagnosis not present

## 2022-05-29 DIAGNOSIS — G4700 Insomnia, unspecified: Secondary | ICD-10-CM | POA: Diagnosis not present

## 2022-05-29 DIAGNOSIS — E559 Vitamin D deficiency, unspecified: Secondary | ICD-10-CM | POA: Diagnosis not present

## 2022-05-29 DIAGNOSIS — Z1159 Encounter for screening for other viral diseases: Secondary | ICD-10-CM | POA: Diagnosis not present

## 2022-05-29 DIAGNOSIS — I10 Essential (primary) hypertension: Secondary | ICD-10-CM | POA: Diagnosis not present

## 2022-05-29 DIAGNOSIS — Z79899 Other long term (current) drug therapy: Secondary | ICD-10-CM | POA: Diagnosis not present

## 2022-05-29 DIAGNOSIS — E46 Unspecified protein-calorie malnutrition: Secondary | ICD-10-CM | POA: Diagnosis not present

## 2022-05-29 DIAGNOSIS — Z0000 Encounter for general adult medical examination without abnormal findings: Secondary | ICD-10-CM | POA: Diagnosis not present

## 2022-05-29 DIAGNOSIS — R634 Abnormal weight loss: Secondary | ICD-10-CM | POA: Diagnosis not present

## 2022-06-12 ENCOUNTER — Other Ambulatory Visit: Payer: Self-pay | Admitting: Nurse Practitioner

## 2022-06-12 DIAGNOSIS — J8410 Pulmonary fibrosis, unspecified: Secondary | ICD-10-CM | POA: Diagnosis not present

## 2022-06-12 DIAGNOSIS — I509 Heart failure, unspecified: Secondary | ICD-10-CM | POA: Diagnosis not present

## 2022-06-12 DIAGNOSIS — I700 Atherosclerosis of aorta: Secondary | ICD-10-CM | POA: Diagnosis not present

## 2022-06-12 DIAGNOSIS — D6869 Other thrombophilia: Secondary | ICD-10-CM | POA: Diagnosis not present

## 2022-06-12 DIAGNOSIS — I110 Hypertensive heart disease with heart failure: Secondary | ICD-10-CM | POA: Diagnosis not present

## 2022-06-12 DIAGNOSIS — H409 Unspecified glaucoma: Secondary | ICD-10-CM | POA: Diagnosis not present

## 2022-06-12 DIAGNOSIS — E210 Primary hyperparathyroidism: Secondary | ICD-10-CM | POA: Diagnosis not present

## 2022-06-12 DIAGNOSIS — Z0001 Encounter for general adult medical examination with abnormal findings: Secondary | ICD-10-CM | POA: Diagnosis not present

## 2022-06-12 DIAGNOSIS — Z7189 Other specified counseling: Secondary | ICD-10-CM | POA: Diagnosis not present

## 2022-06-12 DIAGNOSIS — R109 Unspecified abdominal pain: Secondary | ICD-10-CM

## 2022-06-12 DIAGNOSIS — R634 Abnormal weight loss: Secondary | ICD-10-CM

## 2022-06-12 DIAGNOSIS — I5042 Chronic combined systolic (congestive) and diastolic (congestive) heart failure: Secondary | ICD-10-CM | POA: Diagnosis not present

## 2022-06-17 ENCOUNTER — Inpatient Hospital Stay: Admission: RE | Admit: 2022-06-17 | Payer: Medicare Other | Source: Ambulatory Visit

## 2022-06-17 DIAGNOSIS — H18893 Other specified disorders of cornea, bilateral: Secondary | ICD-10-CM | POA: Diagnosis not present

## 2022-06-26 DIAGNOSIS — I5042 Chronic combined systolic (congestive) and diastolic (congestive) heart failure: Secondary | ICD-10-CM | POA: Diagnosis not present

## 2022-06-26 DIAGNOSIS — Z7189 Other specified counseling: Secondary | ICD-10-CM | POA: Diagnosis not present

## 2022-06-26 DIAGNOSIS — M3501 Sicca syndrome with keratoconjunctivitis: Secondary | ICD-10-CM | POA: Diagnosis not present

## 2022-06-26 DIAGNOSIS — I110 Hypertensive heart disease with heart failure: Secondary | ICD-10-CM | POA: Diagnosis not present

## 2022-06-26 DIAGNOSIS — Z0000 Encounter for general adult medical examination without abnormal findings: Secondary | ICD-10-CM | POA: Diagnosis not present

## 2022-06-26 DIAGNOSIS — E46 Unspecified protein-calorie malnutrition: Secondary | ICD-10-CM | POA: Diagnosis not present

## 2022-06-29 ENCOUNTER — Ambulatory Visit: Payer: Medicare Other | Admitting: Podiatry

## 2022-06-29 VITALS — BP 102/65

## 2022-06-29 DIAGNOSIS — D689 Coagulation defect, unspecified: Secondary | ICD-10-CM

## 2022-06-29 DIAGNOSIS — M79674 Pain in right toe(s): Secondary | ICD-10-CM | POA: Diagnosis not present

## 2022-06-29 DIAGNOSIS — M79675 Pain in left toe(s): Secondary | ICD-10-CM | POA: Diagnosis not present

## 2022-06-29 DIAGNOSIS — B351 Tinea unguium: Secondary | ICD-10-CM

## 2022-06-29 NOTE — Progress Notes (Unsigned)
  Subjective:  Patient ID: Joe Cantu, male    DOB: 06/19/1951,  MRN: [id number]  Joe Cantu presents to clinic today for {jgcomplaint:23593}  Chief Complaint  Patient presents with   Nail Problem    [facility] PCP- Dr. [facility]([place]) PCP VST-   New problem(s): None. {jgcomplaint:23593}  PCP is Renaye Rakers, MD.  No Known Allergies  Review of Systems: Negative except as noted in the HPI.  Objective: No changes noted in today's physical examination. Vitals:   06/29/22 1608  BP: 102/65   Joe Cantu is a pleasant 71 y.o. male {jgbodyhabitus:24098} AAO x 3.  Vascular Examination: CFT <3 seconds b/l LE. Palpable pedal pulses b/l LE. Pedal hair sparse. No pain with calf compression b/l. No edema noted b/l LE. No ischemia or gangrene noted b/l LE. No cyanosis or clubbing noted b/l LE.Marland Kitchen  Dermatological Examination: Pedal skin with normal turgor, texture and tone b/l. No open wounds. No interdigital macerations b/l. Toenails 1-5 b/l thickened, discolored, dystrophic with subungual debris. There is pain on palpation to dorsal aspect of nailplates. No hyperkeratotic nor porokeratotic lesions present on today's visit.Marland Kitchen  Neurological Examination: Protective sensation intact with 10 gram monofilament b/l LE. Vibratory sensation intact b/l LE.   Musculoskeletal Examination: Muscle strength 5/5 to all LE muscle groups b/l. Hammertoe deformity noted 2-5 b/l. Pes planus deformity noted bilateral LE. Patient ambulates independent of any assistive aids.  Assessment/Plan: No diagnosis found.  No orders of the defined types were placed in this encounter.   None {Jgplan:[id number]::"-Patient/POA to call should there be question/concern in the interim."}   No follow-ups on file.  Freddie Breech, DPM

## 2022-06-30 DIAGNOSIS — H401131 Primary open-angle glaucoma, bilateral, mild stage: Secondary | ICD-10-CM | POA: Diagnosis not present

## 2022-06-30 DIAGNOSIS — Z961 Presence of intraocular lens: Secondary | ICD-10-CM | POA: Diagnosis not present

## 2022-06-30 DIAGNOSIS — H18413 Arcus senilis, bilateral: Secondary | ICD-10-CM | POA: Diagnosis not present

## 2022-06-30 DIAGNOSIS — H26491 Other secondary cataract, right eye: Secondary | ICD-10-CM | POA: Diagnosis not present

## 2022-07-01 ENCOUNTER — Encounter: Payer: Self-pay | Admitting: Podiatry

## 2022-07-03 ENCOUNTER — Ambulatory Visit
Admission: RE | Admit: 2022-07-03 | Discharge: 2022-07-03 | Disposition: A | Discharge status: Home / Self Care | Payer: Medicare Other | Source: Ambulatory Visit | Attending: Nurse Practitioner | Admitting: Nurse Practitioner

## 2022-07-03 DIAGNOSIS — R634 Abnormal weight loss: Secondary | ICD-10-CM | POA: Diagnosis not present

## 2022-07-03 DIAGNOSIS — R109 Unspecified abdominal pain: Secondary | ICD-10-CM

## 2022-07-03 DIAGNOSIS — N281 Cyst of kidney, acquired: Secondary | ICD-10-CM | POA: Diagnosis not present

## 2022-07-03 DIAGNOSIS — N200 Calculus of kidney: Secondary | ICD-10-CM | POA: Diagnosis not present

## 2022-07-03 MED ORDER — IOPAMIDOL (ISOVUE-300) INJECTION 61%
100.0000 mL | Freq: Once | INTRAVENOUS | Status: AC | PRN
  Administered 2022-07-03: 100 mL via INTRAVENOUS

## 2022-07-08 DIAGNOSIS — H524 Presbyopia: Secondary | ICD-10-CM | POA: Diagnosis not present

## 2022-07-08 DIAGNOSIS — H18893 Other specified disorders of cornea, bilateral: Secondary | ICD-10-CM | POA: Diagnosis not present

## 2022-07-08 DIAGNOSIS — H16223 Keratoconjunctivitis sicca, not specified as Sjogren's, bilateral: Secondary | ICD-10-CM | POA: Diagnosis not present

## 2022-08-13 ENCOUNTER — Ambulatory Visit: Payer: Medicare Other | Admitting: Cardiology

## 2022-08-13 ENCOUNTER — Encounter: Payer: Self-pay | Admitting: Cardiology

## 2022-08-13 VITALS — BP 104/66 | HR 65 | Ht 75.0 in | Wt 139.4 lb

## 2022-08-13 DIAGNOSIS — R634 Abnormal weight loss: Secondary | ICD-10-CM

## 2022-08-13 DIAGNOSIS — E291 Testicular hypofunction: Secondary | ICD-10-CM

## 2022-08-13 DIAGNOSIS — I10 Essential (primary) hypertension: Secondary | ICD-10-CM

## 2022-08-13 DIAGNOSIS — I493 Ventricular premature depolarization: Secondary | ICD-10-CM

## 2022-08-13 NOTE — Progress Notes (Signed)
Primary Physician/Referring:  Willene Hatchet, NP  Patient ID: Joe Cantu, male    DOB: [birth year], 72 y.o.   MRN: [id number]  Chief Complaint  Patient presents with  . aortic root dilation  . Follow-up  . Hypertension   HPI:    Joe Cantu  is a 72 y.o.  male patient with remote history of tobacco use, abnormal EKG frequent PVCs, aortic root dilatation,  no family history of sudden cardiac death, submassive bilateral DVT and PE on 08/30/2019, negative hypercoagulable work-up, due to unprovoked bilateral lower extremity DVT and submassive PE was recommended lifelong anticoagulation by hematology.  He has had IVC filter retrieved by me in June 2021.  He has not had any recurrence of DVT.  His main complaint is weight loss.  He has mild chronic dyspnea that is unchanged.  No PND or orthopnea, no leg edema.  Past Medical History:  Diagnosis Date  . Aortic root dilatation ([place]) 02/21/2019  . Dyspnea on exertion   . Frequent unifocal PVCs 02/21/2019  . Glaucoma   . Gonorrhea    13 & 54 yrs of age; negative for syphillis, herpes and HIV  . Left inguinal hernia   . Pneumonia   . Presence of IVC filter 01/09/2020  . Ventricular premature beats   . Wears dentures    Past Surgical History:  Procedure Laterality Date  . BRONCHIAL WASHINGS  09/12/2019   Procedure: BRONCHIAL WASHINGS;  Surgeon: Marshell Garfinkel, MD;  Location: [facility];  Service: Cardiopulmonary;;  . CATARACT EXTRACTION  02/2021  . COLONOSCOPY  07/2018   Benign polyps   . ESOPHAGOGASTRODUODENOSCOPY (EGD) WITH PROPOFOL N/A 09/15/2019   Procedure: ESOPHAGOGASTRODUODENOSCOPY (EGD) WITH PROPOFOL;  Surgeon: Carol Ada, MD;  Location: [facility];  Service: Endoscopy;  Laterality: N/A;  . HIATAL HERNIA REPAIR    . IR ANGIOGRAM PULMONARY BILATERAL SELECTIVE  08/30/2019  . IR INFUSION THROMBOL ARTERIAL INITIAL (MS)  08/30/2019  . IR INFUSION THROMBOL ARTERIAL INITIAL (MS)  08/30/2019  . IR IVC FILTER PLMT / S&I  /IMG GUID/MOD SED  08/31/2019  . IR THROMB F/U EVAL ART/VEN FINAL DAY (MS)  08/31/2019  . IR US GUIDE VASC ACCESS LEFT  08/30/2019  . IR US GUIDE VASC ACCESS LEFT  08/30/2019  . IVC FILTER REMOVAL N/A 01/09/2020   Procedure: IVC FILTER REMOVAL;  Surgeon: Adrian Prows, MD;  Location: [facility];  Service: Cardiovascular;  Laterality: N/A;  . RIGHT HEART CATH N/A 01/09/2020   Procedure: RIGHT HEART CATH;  Surgeon: Adrian Prows, MD;  Location: [facility];  Service: Cardiovascular;  Laterality: N/A;  . VIDEO BRONCHOSCOPY N/A 09/12/2019   Procedure: VIDEO BRONCHOSCOPY WITHOUT FLUORO;  Surgeon: Marshell Garfinkel, MD;  Location: [facility];  Service: Cardiopulmonary;  Laterality: N/A;   Social History   Tobacco Use  . Smoking status: Former    Packs/day: 0.50    Years: 10.00    Total pack years: 5.00    Types: Cigarettes    Quit date: 1970    Years since quitting: 54.1  . Smokeless tobacco: Never  Substance Use Topics  . Alcohol use: Not Currently    Comment: occasional wine   Marital Status: Single  ROS  Review of Systems  Constitutional: Positive for weight loss.  Cardiovascular:  Positive for dyspnea on exertion. Negative for chest pain and leg swelling.   Objective  Blood pressure 104/66, pulse 65, height 6\' 3"  (1.905 m), weight 139 lb 6.4 oz (63.2 kg), SpO2  100 %.     08/13/2022    1:07 PM 06/29/2022    4:08 PM 08/22/2021    2:18 PM  Vitals with BMI  Height 6\' 3"     Weight 139 lbs 6 oz    BMI 17.42    Systolic 104 102  Diastolic 66 65 74  Pulse 65  52     Physical Exam Neck:     Vascular: No carotid bruit or JVD.  Cardiovascular:     Rate and Rhythm: Normal rate and regular rhythm.     Pulses: Intact distal pulses.     Heart sounds: Normal heart sounds. No murmur heard.    No gallop.  Pulmonary:     Effort: Pulmonary effort is normal.     Breath sounds: Normal breath sounds.  Abdominal:     General: Bowel sounds are normal.     Palpations:  Abdomen is soft.  Musculoskeletal:     Right lower leg: No edema.     Left lower leg: No edema.   Laboratory examination:  External Labs:   Cholesterol, total 192.000 m 07/31/2021 HDL 66.000 mg 07/31/2021 LDL 112.000 m 07/31/2021 Triglycerides 72.000 mg 07/31/2021  A1C 5.100 % of 07/31/2021 TSH 1.300 07/31/2021  Hemoglobin 14.100 g/d 07/31/2021 Platelets 263.000 Th 07/31/2021  Creatinine, Serum 0.720 mg/ 07/31/2021 Potassium 4.500 mm 07/31/2021 ALT (SGPT) 20.000 U/L 07/31/2021  Medications and allergies  No Known Allergies   Current Outpatient Medications:  .  acetaminophen (TYLENOL) 325 MG tablet, Take 325 mg by mouth every 12 (twelve) hours as needed for mild pain or headache., Disp: , Rfl:  .  Besifloxacin HCl (BESIVANCE) 0.6 % SUSP, INSTILL 1 DROP INTO LEFT EYE THREE TIMES DAILY AS DIRECTED, Disp: , Rfl:  .  CEQUA 0.09 % SOLN, Apply 1 drop to eye 2 (two) times daily., Disp: , Rfl:  .  Difluprednate (DUREZOL) 0.05 % EMUL, INSTILL 1 DROP INTO LEFT EYE THREE TIMES DAILY AS DIRECTED, Disp: , Rfl:  .  dorzolamide (TRUSOPT) 2 % ophthalmic solution, Place 1 drop into both eyes 2 (two) times daily., Disp: , Rfl:  .  dorzolamide-timolol (COSOPT) 22.3-6.8 MG/ML ophthalmic solution, 1 drop 2 (two) times daily., Disp: , Rfl:  .  latanoprost (XALATAN) 0.005 % ophthalmic solution, SMARTSIG:In Eye(s), Disp: , Rfl:  .  magnesium hydroxide (MILK OF MAGNESIA) 400 MG/5ML suspension, Take 15 mLs by mouth at bedtime as needed for mild constipation., Disp: 355 mL, Rfl: 0 .  PROLENSA 0.07 % SOLN, Apply 1 drop to eye as needed., Disp: , Rfl:  .  SYSTANE ULTRA 0.4-0.3 % SOLN, Apply 1 drop to eye daily., Disp: , Rfl:  .  tamsulosin (FLOMAX) 0.4 MG CAPS capsule, Take 0.4 mg by mouth daily after breakfast., Disp: , Rfl:  .  timolol (TIMOPTIC) 0.5 % ophthalmic solution, Place 1 drop into both eyes in the morning and at bedtime., Disp: , Rfl:  .  VYZULTA 0.024 % SOLN, Place 1 drop into both eyes at bedtime., Disp:  , Rfl:  .  XARELTO 20 MG TABS tablet, Take 1 tablet (20 mg total) by mouth daily with supper., Disp: 90 tablet, Rfl: 1   Radiology:   CT angiogram chest 12/17/2020: 1. No evidence for acute pulmonary embolus. 2. Evidence of chronic pulmonary embolus with interval positive remodeling of the main, lobar and segmental pulmonary arteries. Compared with previous exam there has been significant improvement in the patency of the bilateral pulmonary arteries and their branches. No signs of  acute pulmonary embolus. 3. Indeterminate nodular density within the superior segment of right lower lobe is new from the previous chest CT from 08/30/2019. Recommend follow-up examination in 3 months with repeat CT of the chest to assess for any temporal change in the appearance of this. 4. Mild cardiac enlargement. Reflux of contrast material into the IVC and hepatic veins identified which may be seen with right heart failure. The main pulmonary artery is mildly increased in caliber measuring 3.1 cm suggestive of pulmonary artery hypertension. 8.  Aortic atherosclerosis.  Abdominal aorta is widely patent.  All 3 mesenteric vessels are widely patent.  Cardiac Studies:   Lexiscan Sestamibi stress test 08/08/2018: 1. Lexiscan stress test was performed. Exercise capacity was not assessed. No stress symptoms reported. Resting blood pressure was 142/82 mmHg and peak effect blood pressure was 108/56 mmHg. The resting and stress electrocardiogram demonstrated normal sinus rhythm, normal resting conduction, frequent PVC's and normal rest repolarization. Stress EKG is non diagnostic for ischemia as it is a pharmacologic stress. 2. Left ventricular cavity is noted to be enlarged on the rest and stress studies.  Gated SPECT images mild decrease in myocardial thickening and wall motion.  The left ventricular ejection fraction was calculated or visually estimated to be 46%.  Small sized, mild intensity, fixed inferior perfusion  defect likely related to tissue attenuation artifact. Findings likely represent nonischemic cardiomyopathy. 3. High risk study due to reduced LVEF and frequent PVC's.  Holter Monitor for 24 hours  09/08/2018:  Minimum heart rate 47, maximum heart rate 118 BPM, normal sinus.  PVC but this 18.3%.  20,600 beats PVCs noted 8 wearing couplets 48 wearing triplets and 435 wearing bigeminy, 135 in trigeminy.  Longest run 4 beats.  There were 102 PACs noted.  Right heart catheterization and IVC filter retrieval 01/09/2020: Normal right heart catheterization pressures. Successful retrieval of Bard IVC filter without complications.  Lower extremity venous duplex 12/17/2020: RIGHT:  - Findings consistent with chronic deep vein thrombosis involving the right common femoral vein, and right femoral vein.  - No cystic structure found in the popliteal fossa.  LEFT:  - Findings consistent with age indeterminate deep vein thrombosis involving the left femoral vein, left popliteal vein, and left peroneal veins.  - No cystic structure found in the popliteal fossa.  No significant change from from 11/28/2019, 12/11/2019.  PCV ECHOCARDIOGRAM COMPLETE 08/19/2021  Narrative Echocardiogram 08/19/2021: Normal LV systolic function with visual EF 60-65%. Left ventricle cavity is normal in size. Normal left ventricular wall thickness. Normal global wall motion. Normal diastolic filling pattern, normal LAP. Native trileaflet aortic valve. Trace aortic regurgitation. Myxomatous degeneration with mild bileaflet prolapse. Moderate (Grade II) mitral regurgitation. Moderate tricuspid regurgitation. No evidence of pulmonary hypertension, RVSP 95mmHG. The aortic root is normal in size (66mm Sinus of Valsalva).  Proximal ascending aorta not well visualized. Compared to study 02/13/2021: Aortic root dilatation has resolved (48mm now 35mm), mild MR and TR are now moderate.   EKG:  EKG 08/13/2022: Normal sinus rhythm at the rate  of 60 bpm, normal axis, nonspecific T abnormality.  Compared to 08/13/2021, no significant change.    Assessment     ICD-10-CM   1. Essential hypertension  I10 EKG 12-Lead    2. Frequent unifocal PVCs  I49.3     3. Unintentional weight loss  R63.4 Testosterone, Free, Total, SHBG    4. Male hypogonadism  E29.1 Testosterone, Free, Total, SHBG     No orders of the defined types were placed in  this encounter.    Medications Discontinued During This Encounter  Medication Reason  . citalopram (CELEXA) 10 MG tablet Completed Course  . busPIRone (BUSPAR) 10 MG tablet Completed Course  . eszopiclone (LUNESTA) 2 MG TABS tablet Completed Course    Recommendations:   Joe Cantu  is a 72 y.o. Black or African American [2] male patient with remote history of tobacco use, abnormal EKG, frequent PVCs, aortic root dilatation,  no family history of sudden cardiac death, submassive bilateral DVT and PE on 08/30/2019, negative hypercoagulable work-up, due to unprovoked bilateral lower extremity DVT and submassive PE was recommended lifelong anticoagulation by hematology.  He has had IVC filter retrieved by me in June 2021.  He has not had any recurrence of DVT.    1. Essential hypertension Blood pressure is under excellent control.  He is presently doing well and remains asymptomatic except for continued weight loss.  In fact with weight loss, his blood pressure has normalized, he is presently not on any antihypertensive medications except for tamsulosin for BPH as well.  2. Frequent unifocal PVCs No further PVCs, EKG reveals normal sinus rhythm.  He remains asymptomatic without palpitations.  Hence this is resolved issue.  3. Unintentional weight loss Unintentional weight loss is unexplained.  Thyroid function is normal.  I reviewed the CT of the abdomen with and without contrast, I personally discussed with radiologist today, although mention not made, he has mesenteric vessels that are widely  patent.  Hence mesenteric ischemia is unlikely.  It could be that he has very low testosterone levels.  He is appetite is good, he is eating well, but continues to lose weight.  4. Male hypogonadism I have placed orders for testosterone levels, this may explain his weight loss.  I will forward copies of labs to his PCP, he has now established with Cletis Athens, NP.  Otherwise from cardiac standpoint he is stable, I will see him back in 6 months and if he remains stable, on a as needed basis.  He will continue anticoagulation indefinitely in view of hypercoagulable state, continue Xarelto.  He has mild chronic dyspnea related to this.  No clinical evidence of heart failure.   Adrian Prows, MD, [facility] 08/13/2022, 1:40 PM Office: [phone]

## 2022-08-24 LAB — TESTOSTERONE, FREE, TOTAL, SHBG
Sex Hormone Binding: 124 nmol/L — ABNORMAL HIGH (ref 19.3–76.4)
Testosterone, Free: 3.1 pg/mL — ABNORMAL LOW (ref 6.6–18.1)
Testosterone: 663 ng/dL (ref 264–916)

## 2022-08-24 NOTE — Progress Notes (Signed)
He requested that I do this for him

## 2022-08-25 ENCOUNTER — Telehealth: Payer: Self-pay

## 2022-08-25 NOTE — Telephone Encounter (Signed)
Testosterone 264 - 916 ng/dL 663 Comment: Adult male reference interval is based on a population of healthy nonobese males (BMI <30) between 69 and 70 years old. [place], [place] [phone]. PMID: [id number]. Testosterone, Free 6.6 - 18.1 pg/mL 3.1 Low  Sex Hormone Binding 19.3 - 76.4 nmol/L 124.0 High   This may indicate that the levels although sufficient may not be enough and working for him and could be reason for weight loss. He can discuss with PCP and or consider endocrinology referral from PCP

## 2022-08-25 NOTE — Telephone Encounter (Signed)
Patient called and wants to know if you can tell him what the Testosterone levels mean. Please advise.

## 2022-08-27 NOTE — Telephone Encounter (Signed)
Patient called back, I have explained lab results. Advised him to discuss with PCP or consider Endocrinology.

## 2022-08-27 NOTE — Telephone Encounter (Signed)
Called patient, NA, did not leave a message.

## 2022-10-19 ENCOUNTER — Ambulatory Visit: Payer: Medicare Other | Admitting: Podiatry

## 2022-10-19 VITALS — BP 131/80 | HR 53

## 2022-10-19 DIAGNOSIS — R634 Abnormal weight loss: Secondary | ICD-10-CM | POA: Insufficient documentation

## 2022-10-19 DIAGNOSIS — M79675 Pain in left toe(s): Secondary | ICD-10-CM

## 2022-10-19 DIAGNOSIS — D509 Iron deficiency anemia, unspecified: Secondary | ICD-10-CM | POA: Insufficient documentation

## 2022-10-19 DIAGNOSIS — B351 Tinea unguium: Secondary | ICD-10-CM | POA: Diagnosis not present

## 2022-10-19 DIAGNOSIS — D689 Coagulation defect, unspecified: Secondary | ICD-10-CM

## 2022-10-19 DIAGNOSIS — R194 Change in bowel habit: Secondary | ICD-10-CM | POA: Insufficient documentation

## 2022-10-19 DIAGNOSIS — K5900 Constipation, unspecified: Secondary | ICD-10-CM | POA: Insufficient documentation

## 2022-10-19 DIAGNOSIS — M79674 Pain in right toe(s): Secondary | ICD-10-CM

## 2022-10-19 DIAGNOSIS — R933 Abnormal findings on diagnostic imaging of other parts of digestive tract: Secondary | ICD-10-CM | POA: Insufficient documentation

## 2022-10-19 NOTE — Progress Notes (Unsigned)
  Subjective:  Patient ID: YU DEARMENT, male    DOB: September 21, 1950,  MRN: [id number]  LUCAH DIAMANT presents to clinic today for at risk foot care with h/o clotting disorder and painful elongated mycotic toenails 1-5 bilaterally which are tender when wearing enclosed shoe gear. Pain is relieved with periodic professional debridement.  Chief Complaint  Patient presents with   NAIL CARE    RFC   New problem(s): None.   PCP is Estevan Oaks, NP.  No Known Allergies  Review of Systems: Negative except as noted in the HPI.  Objective: No changes noted in today's physical examination. Vitals:   10/19/22 1600  BP: 131/80  Pulse: (!) 53   Kaitlin C Noorani is a pleasant 72 y.o. male WD, WN in NAD. AAO x 3.  Vascular Examination: CFT <3 seconds b/l LE. Palpable pedal pulses b/l LE. Pedal hair sparse. No pain with calf compression b/l. No edema noted b/l LE. No ischemia or gangrene noted b/l LE. No cyanosis or clubbing noted b/l LE.Marland Kitchen  Dermatological Examination: Pedal skin with normal turgor, texture and tone b/l. No open wounds. No interdigital macerations b/l. Toenails 1-5 b/l thickened, discolored, dystrophic with subungual debris. There is pain on palpation to dorsal aspect of nailplates. No hyperkeratotic nor porokeratotic lesions present on today's visit.  Neurological Examination: Protective sensation intact with 10 gram monofilament b/l LE. Vibratory sensation intact b/l LE.   Musculoskeletal Examination: Muscle strength 5/5 to all LE muscle groups b/l. Hammertoe deformity noted 2-5 b/l. Pes planus deformity noted bilateral LE. Patient ambulates independent of any assistive aids.  Assessment/Plan: 1. Pain due to onychomycosis of toenails of both feet   2. Clotting disorder   -Consent given for treatment as described below: -Examined patient. -Continue supportive shoe gear daily. -Mycotic toenails 1-5 bilaterally were debrided in length and girth with sterile nail nippers and  dremel without incident. -Patient/POA to call should there be question/concern in the interim.   Return in about 3 months (around 01/18/2023).  Freddie Breech, DPM

## 2022-10-20 ENCOUNTER — Encounter: Payer: Self-pay | Admitting: Podiatry

## 2023-02-11 ENCOUNTER — Ambulatory Visit: Payer: Medicare Other | Admitting: Cardiology

## 2023-02-11 ENCOUNTER — Encounter: Payer: Self-pay | Admitting: Cardiology

## 2023-02-11 VITALS — BP 105/66 | HR 60 | Resp 16 | Ht 75.0 in | Wt 132.4 lb

## 2023-02-11 DIAGNOSIS — I77810 Thoracic aortic ectasia: Secondary | ICD-10-CM

## 2023-02-11 DIAGNOSIS — R634 Abnormal weight loss: Secondary | ICD-10-CM

## 2023-02-11 DIAGNOSIS — E291 Testicular hypofunction: Secondary | ICD-10-CM

## 2023-02-11 DIAGNOSIS — I340 Nonrheumatic mitral (valve) insufficiency: Secondary | ICD-10-CM

## 2023-02-11 DIAGNOSIS — I493 Ventricular premature depolarization: Secondary | ICD-10-CM

## 2023-02-11 MED ORDER — METOPROLOL TARTRATE 25 MG PO TABS: 12.5000 mg | ORAL_TABLET | Freq: Two times a day (BID) | ORAL | 2 refills | Status: DC

## 2023-02-11 MED ORDER — TESTOSTERONE 12.5 MG/ACT (1%) TD GEL: 12.5000 mg | Freq: Every day | TRANSDERMAL | 2 refills | Status: DC

## 2023-02-11 NOTE — Progress Notes (Signed)
Primary Physician/Referring:  Estevan Oaks, NP  Patient ID: Joe Cantu, male    DOB: [birth year], 72 y.o.   MRN: [id number]  Chief Complaint  Patient presents with   Hypertension   Hyperlipidemia   Pulmonary hypertension   Weight Loss        Follow-up    6 months   HPI:    Joe Cantu  is a 72 y.o.  male patient with remote history of tobacco use, abnormal EKG frequent PVCs, aortic root dilatation,  no family history of sudden cardiac death, submassive bilateral DVT and PE on 08/30/2019, negative hypercoagulable work-up, due to unprovoked bilateral lower extremity DVT and submassive PE was recommended lifelong anticoagulation by hematology.  He has had IVC filter retrieved by me in June 2021.  He has not had any recurrence of DVT.  His main complaint is weight loss.  He has mild chronic dyspnea that is unchanged.  No PND or orthopnea, no leg edema.  Past Medical History:  Diagnosis Date   Aortic root dilatation (HCC) 02/21/2019   Dyspnea on exertion    Frequent unifocal PVCs 02/21/2019   Glaucoma    Gonorrhea    18 & 19 yrs of age; negative for syphillis, herpes and HIV   Left inguinal hernia    Pneumonia    Presence of IVC filter 01/09/2020   Ventricular premature beats    Wears dentures    Past Surgical History:  Procedure Laterality Date   BRONCHIAL WASHINGS  09/12/2019   Procedure: BRONCHIAL WASHINGS;  Surgeon: Chilton Greathouse, MD;  Location: [facility];  Service: Cardiopulmonary;;   CATARACT EXTRACTION  02/2021   COLONOSCOPY  07/2018   Benign polyps    ESOPHAGOGASTRODUODENOSCOPY (EGD) WITH PROPOFOL N/A 09/15/2019   Procedure: ESOPHAGOGASTRODUODENOSCOPY (EGD) WITH PROPOFOL;  Surgeon: Jeani Hawking, MD;  Location: [facility];  Service: Endoscopy;  Laterality: N/A;   HIATAL HERNIA REPAIR     IR ANGIOGRAM PULMONARY BILATERAL SELECTIVE  08/30/2019   IR INFUSION THROMBOL ARTERIAL INITIAL (MS)  08/30/2019   IR INFUSION THROMBOL ARTERIAL INITIAL (MS)   08/30/2019   IR IVC FILTER PLMT / S&I /IMG GUID/MOD SED  08/31/2019   IR THROMB F/U EVAL ART/VEN FINAL DAY (MS)  08/31/2019   IR US GUIDE VASC ACCESS LEFT  08/30/2019   IR US GUIDE VASC ACCESS LEFT  08/30/2019   IVC FILTER REMOVAL N/A 01/09/2020   Procedure: IVC FILTER REMOVAL;  Surgeon: Yates Decamp, MD;  Location: [facility];  Service: Cardiovascular;  Laterality: N/A;   RIGHT HEART CATH N/A 01/09/2020   Procedure: RIGHT HEART CATH;  Surgeon: Yates Decamp, MD;  Location: [facility] INVASIVE CV LAB;  Service: Cardiovascular;  Laterality: N/A;   VIDEO BRONCHOSCOPY N/A 09/12/2019   Procedure: VIDEO BRONCHOSCOPY WITHOUT FLUORO;  Surgeon: Chilton Greathouse, MD;  Location: [facility];  Service: Cardiopulmonary;  Laterality: N/A;   Social History   Tobacco Use   Smoking status: Former    Current packs/day: 0.00    Average packs/day: 0.5 packs/day for 10.0 years (5.0 ttl pk-yrs)    Types: Cigarettes    Start date: 9    Quit date: 1970    Years since quitting: 54.6   Smokeless tobacco: Never  Substance Use Topics   Alcohol use: Not Currently    Comment: occasional wine   Marital Status: Single  ROS  Review of Systems  Constitutional: Positive for weight loss.  Cardiovascular:  Positive for dyspnea on exertion. Negative for chest pain and  leg swelling.   Objective  Blood pressure 105/66, pulse 60, resp. rate 16, height 6\' 3"  (1.905 m), weight 132 lb 6.4 oz (60.1 kg), SpO2 99%.     02/11/2023    3:25 PM 10/19/2022    4:00 PM 08/13/2022    1:07 PM  Vitals with BMI  Height 6\' 3"   6\' 3"   Weight 132 lbs 6 oz  139 lbs 6 oz  BMI 16.55  17.42  Systolic 105 131 161  Diastolic 66 80 66  Pulse 60 53 65     Physical Exam Neck:     Vascular: No carotid bruit or JVD.  Cardiovascular:     Rate and Rhythm: Normal rate and regular rhythm.     Pulses: Intact distal pulses.     Heart sounds: Normal heart sounds. No murmur heard.    No gallop.  Pulmonary:     Effort: Pulmonary effort is  normal.     Breath sounds: Normal breath sounds.  Abdominal:     General: Bowel sounds are normal.     Palpations: Abdomen is soft.  Musculoskeletal:     Right lower leg: No edema.     Left lower leg: No edema.    Laboratory examination:  External Labs:   Testosterone 264 - 916 ng/dL      096 Comment: Adult male reference interval is based on a population of healthy nonobese males (BMI <30) between 66 and 58 years old. Travison, et.al. JCEM [phone]. PMID: [id number]. Testosterone, Free 6.6 - 18.1 pg/mL     3.1 Low  Sex Hormone Binding 19.3 - 76.4 nmol/L     124.0 High   Cholesterol, total 192.000 m 07/31/2021 HDL 66.000 mg 07/31/2021 LDL 112.000 m 07/31/2021 Triglycerides 72.000 mg 07/31/2021  A1C 5.100 % of 07/31/2021 TSH 1.300 07/31/2021  Hemoglobin 14.100 g/d 07/31/2021 Platelets 263.000 Th 07/31/2021  Creatinine, Serum 0.720 mg/ 07/31/2021 Potassium 4.500 mm 07/31/2021 ALT (SGPT) 20.000 U/L 07/31/2021  Radiology:   CT angiogram chest 12/17/2020: 1. No evidence for acute pulmonary embolus. 2. Evidence of chronic pulmonary embolus with interval positive remodeling of the main, lobar and segmental pulmonary arteries. Compared with previous exam there has been significant improvement in the patency of the bilateral pulmonary arteries and their branches. No signs of acute pulmonary embolus. 3. Indeterminate nodular density within the superior segment of right lower lobe is new from the previous chest CT from 08/30/2019. Recommend follow-up examination in 3 months with repeat CT of the chest to assess for any temporal change in the appearance of this. 4. Mild cardiac enlargement. Reflux of contrast material into the IVC and hepatic veins identified which may be seen with right heart failure. The main pulmonary artery is mildly increased in caliber measuring 3.1 cm suggestive of pulmonary artery hypertension. 8.  Aortic atherosclerosis.  Abdominal aorta is widely patent.  All 3  mesenteric vessels are widely patent.  Cardiac Studies:   Lexiscan Sestamibi stress test 08/08/2018: 1. Lexiscan stress test was performed. Exercise capacity was not assessed. No stress symptoms reported. Resting blood pressure was 142/82 mmHg and peak effect blood pressure was 108/56 mmHg. The resting and stress electrocardiogram demonstrated normal sinus rhythm, normal resting conduction, frequent PVC's and normal rest repolarization. Stress EKG is non diagnostic for ischemia as it is a pharmacologic stress. 2. Left ventricular cavity is noted to be enlarged on the rest and stress studies.  Gated SPECT images mild decrease in myocardial thickening and wall motion.  The left ventricular ejection fraction  was calculated or visually estimated to be 46%.  Small sized, mild intensity, fixed inferior perfusion defect likely related to tissue attenuation artifact. Findings likely represent nonischemic cardiomyopathy. 3. High risk study due to reduced LVEF and frequent PVC's.  Holter Monitor for 24 hours  09/08/2018:  Minimum heart rate 47, maximum heart rate 118 BPM, normal sinus.  PVC but this 18.3%.  20,600 beats PVCs noted 8 wearing couplets 48 wearing triplets and 435 wearing bigeminy, 135 in trigeminy.  Longest run 4 beats.  There were 102 PACs noted.  Right heart catheterization and IVC filter retrieval 01/09/2020: Normal right heart catheterization pressures. Successful retrieval of Bard IVC filter without complications.  Lower extremity venous duplex 12/17/2020: RIGHT:  - Findings consistent with chronic deep vein thrombosis involving the right common femoral vein, and right femoral vein.  - No cystic structure found in the popliteal fossa.  LEFT:  - Findings consistent with age indeterminate deep vein thrombosis involving the left femoral vein, left popliteal vein, and left peroneal veins.  - No cystic structure found in the popliteal fossa.  No significant change from from 11/28/2019,  12/11/2019.  Echocardiogram 08/19/2021: Normal LV systolic function with visual EF 60-65%. Left ventricle cavity is normal in size. Normal left ventricular wall thickness. Normal global wall motion. Normal diastolic filling pattern, normal LAP. Native trileaflet aortic valve. Trace aortic regurgitation. Myxomatous degeneration with mild bileaflet prolapse. Moderate (Grade II) mitral regurgitation. Moderate tricuspid regurgitation. No evidence of pulmonary hypertension, RVSP . The aortic root is normal in size (23mm Sinus of Valsalva).  Proximal ascending aorta not well visualized.  Compared to study 02/13/2021: Aortic root dilatation has resolved (41mm now 23mm), mild MR and TR are now moderate.  EKG:  EKG 02/11/2023: Normal sinus rhythm at rate of 53 bpm, left atrial enlargement, normal axis.  LVH.  Single PVC.  Compared to 08/13/2022, nonspecific inferior T wave abnormality not present, LVH is new.   Medications and allergies  No Known Allergies   Current Outpatient Medications:    acetaminophen (TYLENOL) 325 MG tablet, Take 325 mg by mouth every 12 (twelve) hours as needed for mild pain or headache., Disp: , Rfl:    CEQUA 0.09 % SOLN, Apply 1 drop to eye 2 (two) times daily., Disp: , Rfl:    Difluprednate (DUREZOL) 0.05 % EMUL, INSTILL 1 DROP INTO LEFT EYE THREE TIMES DAILY AS DIRECTED, Disp: , Rfl:    dorzolamide (TRUSOPT) 2 % ophthalmic solution, Place 1 drop into both eyes 2 (two) times daily., Disp: , Rfl:    dorzolamide-timolol (COSOPT) 22.3-6.8 MG/ML ophthalmic solution, 1 drop 2 (two) times daily., Disp: , Rfl:    latanoprost (XALATAN) 0.005 % ophthalmic solution, SMARTSIG:In Eye(s), Disp: , Rfl:    magnesium hydroxide (MILK OF MAGNESIA) 400 MG/5ML suspension, Take 15 mLs by mouth at bedtime as needed for mild constipation., Disp: 355 mL, Rfl: 0   metoprolol tartrate (LOPRESSOR) 25 MG tablet, Take 0.5 tablets (12.5 mg total) by mouth 2 (two) times daily., Disp: 30 tablet, Rfl:  2   PROLENSA 0.07 % SOLN, Apply 1 drop to eye as needed., Disp: , Rfl:    tamsulosin (FLOMAX) 0.4 MG CAPS capsule, Take 0.4 mg by mouth daily after breakfast., Disp: , Rfl:    Testosterone 12.5 MG/ACT (1%) GEL, Place 12.5 mg onto the skin daily., Disp: 39 g, Rfl: 2   VYZULTA 0.024 % SOLN, Place 1 drop into both eyes at bedtime., Disp: , Rfl:    XARELTO 20 MG TABS  tablet, Take 1 tablet (20 mg total) by mouth daily with supper., Disp: 90 tablet, Rfl: 1   Assessment     ICD-10-CM   1. Frequent unifocal PVCs  I49.3 EKG 12-Lead    metoprolol tartrate (LOPRESSOR) 25 MG tablet    2. Unintentional weight loss  R63.4 Testosterone 12.5 MG/ACT (1%) GEL    3. Hypogonadism in male  E29.1 Testosterone 12.5 MG/ACT (1%) GEL    4. Moderate mitral regurgitation by prior echocardiogram  I34.0 PCV ECHOCARDIOGRAM COMPLETE    5. Aortic root dilatation (HCC)  I77.810 PCV ECHOCARDIOGRAM COMPLETE      Meds ordered this encounter  Medications   Testosterone 12.5 MG/ACT (1%) GEL    Sig: Place 12.5 mg onto the skin daily.    Dispense:  39 g    Refill:  2   metoprolol tartrate (LOPRESSOR) 25 MG tablet    Sig: Take 0.5 tablets (12.5 mg total) by mouth 2 (two) times daily.    Dispense:  30 tablet    Refill:  2   Medications Discontinued During This Encounter  Medication Reason   Besifloxacin HCl (BESIVANCE) 0.6 % SUSP    SYSTANE ULTRA 0.4-0.3 % SOLN    timolol (TIMOPTIC) 0.5 % ophthalmic solution    Recommendations:   Joe Cantu  is a 72 y.o. Black or African American [2] male patient with remote history of tobacco use, abnormal EKG, frequent PVCs, aortic root dilatation,  no family history of sudden cardiac death, submassive bilateral DVT and PE on 08/30/2019, negative hypercoagulable work-up, due to unprovoked bilateral lower extremity DVT and submassive PE was recommended lifelong anticoagulation by hematology.  He has had IVC filter retrieved by me in June 2021.  He has not had any recurrence of  DVT.    1. Frequent unifocal PVCs Discontinued beta-blocker in the past as his blood pressure was soft.  Patient states that since then he has not felt well and would like to go back to being on low-dose of beta-blocker.  Metoprolol tartrate 25 mg one half twice daily was prescribed. - EKG 12-Lead - metoprolol tartrate (LOPRESSOR) 25 MG tablet; Take 0.5 tablets (12.5 mg total) by mouth 2 (two) times daily.  Dispense: 30 tablet; Refill: 2  2. Unintentional weight loss Patient continues to lose weight, I had performed testosterone levels, which is certainly at the lower limit and hence I would like to try testosterone supplementation.  If indeed it does work, I would recommend that he start getting intramuscular injections on a monthly basis from his PCP.  - Testosterone 12.5 MG/ACT (1%) GEL; Place 12.5 mg onto the skin daily.  Dispense: 39 g; Refill: 2  3. Hypogonadism in male Testosterone supplementation in the form of gel was prescribed today. - Testosterone 12.5 MG/ACT (1%) GEL; Place 12.5 mg onto the skin daily.  Dispense: 39 g; Refill: 2  4. Moderate mitral regurgitation by prior echocardiogram Patient has moderate mitral regurgitation.  His heart sounds are distant.  Will repeat echocardiogram and will also follow-up on his aortic root dilatation as well. - PCV ECHOCARDIOGRAM COMPLETE; Future  5. Aortic root dilatation (HCC) Patient presently not on ARB or beta-blocker therapy but now have started him on low-dose of beta-blocker therapy.  This is in view of low blood pressure, he has had significant weight loss over the past 1 year hence there has been resolution of his hypertension.  I would like to see him back in 3 months to follow-up on weight loss and also  symptoms of palpitations.  - PCV ECHOCARDIOGRAM COMPLETE; Future    Yates Decamp, MD, [facility] 02/17/2023, 11:34 AM Office: [phone]

## 2023-03-02 ENCOUNTER — Ambulatory Visit: Payer: Medicare Other | Admitting: Podiatry

## 2023-03-02 ENCOUNTER — Encounter: Payer: Self-pay | Admitting: Podiatry

## 2023-03-02 DIAGNOSIS — D689 Coagulation defect, unspecified: Secondary | ICD-10-CM | POA: Diagnosis not present

## 2023-03-02 DIAGNOSIS — M79675 Pain in left toe(s): Secondary | ICD-10-CM

## 2023-03-02 DIAGNOSIS — M79674 Pain in right toe(s): Secondary | ICD-10-CM | POA: Diagnosis not present

## 2023-03-02 DIAGNOSIS — B351 Tinea unguium: Secondary | ICD-10-CM | POA: Diagnosis not present

## 2023-03-03 ENCOUNTER — Other Ambulatory Visit: Payer: Medicare Other

## 2023-03-04 ENCOUNTER — Other Ambulatory Visit: Payer: Medicare Other

## 2023-03-08 NOTE — Progress Notes (Signed)
  Subjective:  Patient ID: Joe Cantu, male    DOB: Apr 23, 1951,  MRN: [id number]  Joe Cantu presents to clinic today for at risk foot care with h/o clotting disorder and painful thick toenails that are difficult to trim. Pain interferes with ambulation. Aggravating factors include wearing enclosed shoe gear. Pain is relieved with periodic professional debridement.  Chief Complaint  Patient presents with   RFC    RFC BLOOD THINNER   New problem(s): None.   PCP is Estevan Oaks, NP.  No Known Allergies  Review of Systems: Negative except as noted in the HPI.  Objective: No changes noted in today's physical examination. There were no vitals filed for this visit. Joe Cantu is a pleasant 72 y.o. male WD, WN in NAD. AAO x 3.  Vascular Examination: CFT <3 seconds b/l LE. Palpable pedal pulses b/l LE. Pedal hair sparse. No pain with calf compression b/l. No edema noted b/l LE. No ischemia or gangrene noted b/l LE. No cyanosis or clubbing noted b/l LE.Marland Kitchen  Dermatological Examination: Pedal skin with normal turgor, texture and tone b/l. No open wounds. No interdigital macerations b/l. Toenails 1-5 b/l thickened, discolored, dystrophic with subungual debris. There is pain on palpation to dorsal aspect of nailplates. No hyperkeratotic nor porokeratotic lesions present on today's visit.  Neurological Examination: Protective sensation intact with 10 gram monofilament b/l LE. Vibratory sensation intact b/l LE.   Musculoskeletal Examination: Muscle strength 5/5 to all LE muscle groups b/l. Hammertoe deformity noted 2-5 b/l. Pes planus deformity noted bilateral LE. Patient ambulates independent of any assistive aids. Assessment/Plan: 1. Pain due to onychomycosis of toenails of both feet   2. Clotting disorder [facility])     -Patient was evaluated and treated. All patient's and/or POA's questions/concerns answered on today's visit. -Continue foot and shoe inspections daily. Monitor blood  glucose per PCP/Endocrinologist's recommendations. -Patient to continue soft, supportive shoe gear daily. -Toenails 1-5 b/l were debrided in length and girth with sterile nail nippers and dremel without iatrogenic bleeding.  -Patient/POA to call should there be question/concern in the interim.   Return in about 3 months (around 06/02/2023).  Freddie Breech, DPM

## 2023-03-12 ENCOUNTER — Ambulatory Visit: Payer: Medicare Other

## 2023-03-12 DIAGNOSIS — I340 Nonrheumatic mitral (valve) insufficiency: Secondary | ICD-10-CM

## 2023-03-12 DIAGNOSIS — I77810 Thoracic aortic ectasia: Secondary | ICD-10-CM

## 2023-04-28 ENCOUNTER — Telehealth: Payer: Self-pay | Admitting: Cardiology

## 2023-04-28 NOTE — Telephone Encounter (Signed)
Patient states he is returning a call, but he does not know who called or what it was in regards to.

## 2023-04-28 NOTE — Telephone Encounter (Signed)
Spoke with patient and he states he was returning a call from a couple of months ago. I do not see any recents calls even from a couple of months ago. He is aware of all recent lab  and test results.

## 2023-06-01 ENCOUNTER — Ambulatory Visit: Payer: Self-pay | Admitting: Cardiology

## 2023-06-02 ENCOUNTER — Encounter: Payer: Self-pay | Admitting: Podiatry

## 2023-06-02 ENCOUNTER — Ambulatory Visit: Payer: Medicare Other | Admitting: Podiatry

## 2023-06-02 DIAGNOSIS — M79674 Pain in right toe(s): Secondary | ICD-10-CM | POA: Diagnosis not present

## 2023-06-02 DIAGNOSIS — B351 Tinea unguium: Secondary | ICD-10-CM | POA: Diagnosis not present

## 2023-06-02 DIAGNOSIS — M79675 Pain in left toe(s): Secondary | ICD-10-CM

## 2023-06-02 DIAGNOSIS — D689 Coagulation defect, unspecified: Secondary | ICD-10-CM | POA: Diagnosis not present

## 2023-06-02 NOTE — Progress Notes (Signed)
  Subjective:  Patient ID: Joe Cantu, male    DOB: [birth year],   MRN: [id number]  Chief Complaint  Patient presents with   Nail Problem    RFC.    72 y.o. male presents for concern of thickened elongated and painful nails that are difficult to trim. Requesting to have them trimmed today. He is on blood thinner and at risk for foot care.   PCP:  Estevan Oaks, NP    . Denies any other pedal complaints. Denies n/v/f/c.   Past Medical History:  Diagnosis Date   Aortic root dilatation (HCC) 02/21/2019   Dyspnea on exertion    Frequent unifocal PVCs 02/21/2019   Glaucoma    Gonorrhea    18 & 19 yrs of age; negative for syphillis, herpes and HIV   Left inguinal hernia    Pneumonia    Presence of IVC filter 01/09/2020   Ventricular premature beats    Wears dentures     Objective:  Physical Exam: Vascular: DP/PT pulses 2/4 bilateral. CFT <3 seconds. Normal hair growth on digits. No edema.  Skin. No lacerations or abrasions bilateral feet. Nails 1-5 bilateral are thickened elongated and dystrophic.  Musculoskeletal: MMT 5/5 bilateral lower extremities in DF, PF, Inversion and Eversion. Deceased ROM in DF of ankle joint.  Neurological: Sensation intact to light touch.   Assessment:   1. Pain due to onychomycosis of toenails of both feet   2. Clotting disorder [facility])      Plan:  Patient was evaluated and treated and all questions answered. -Mechanically debrided all nails 1-5 bilateral using sterile nail nipper and filed with dremel without incident  -Answered all patient questions -Patient to return  in 3 months for at risk foot care -Patient advised to call the office if any problems or questions arise in the meantime.   Louann Sjogren, DPM

## 2023-06-25 ENCOUNTER — Ambulatory Visit: Payer: Medicare Other | Attending: Cardiology | Admitting: Physician Assistant

## 2023-06-25 ENCOUNTER — Encounter: Payer: Self-pay | Admitting: Physician Assistant

## 2023-06-25 VITALS — BP 92/58 | HR 57 | Ht 75.0 in | Wt 138.2 lb

## 2023-06-25 DIAGNOSIS — I340 Nonrheumatic mitral (valve) insufficiency: Secondary | ICD-10-CM | POA: Diagnosis not present

## 2023-06-25 DIAGNOSIS — I10 Essential (primary) hypertension: Secondary | ICD-10-CM | POA: Diagnosis not present

## 2023-06-25 DIAGNOSIS — I493 Ventricular premature depolarization: Secondary | ICD-10-CM

## 2023-06-25 DIAGNOSIS — R634 Abnormal weight loss: Secondary | ICD-10-CM

## 2023-06-25 DIAGNOSIS — I77810 Thoracic aortic ectasia: Secondary | ICD-10-CM

## 2023-06-25 MED ORDER — XARELTO 20 MG PO TABS: 20.0000 mg | ORAL_TABLET | Freq: Every day | ORAL | 3 refills | Status: DC

## 2023-06-25 NOTE — Patient Instructions (Addendum)
Medication Instructions:  Your physician has recommended you make the following change in your medication:   STOP Metoprolol  *If you need a refill on your cardiac medications before your next appointment, please call your pharmacy*   Lab Work: None ordered If you have labs (blood work) drawn today and your tests are completely normal, you will receive your results only by: MyChart Message (if you have MyChart) OR A paper copy in the mail If you have any lab test that is abnormal or we need to change your treatment, we will call you to review the results.   Testing/Procedures: Your physician has requested that you have an echocardiogram. Echocardiography is a painless test that uses sound waves to create images of your heart. It provides your doctor with information about the size and shape of your heart and how well your heart's chambers and valves are working. This procedure takes approximately one hour. There are no restrictions for this procedure. Please do NOT wear cologne, perfume, aftershave, or lotions (deodorant is allowed). Please arrive 15 minutes prior to your appointment time.  Please note: We ask at that you not bring children with you during ultrasound (echo/ vascular) testing. Due to room size and safety concerns, children are not allowed in the ultrasound rooms during exams. Our front office staff cannot provide observation of children in our lobby area while testing is being conducted. An adult accompanying a patient to their appointment will only be allowed in the ultrasound room at the discretion of the ultrasound technician under special circumstances. We apologize for any inconvenience.    Follow-Up: At [facility], you and your health needs are our priority.  As part of our continuing mission to provide you with exceptional heart care, we have created designated Provider Care Teams.  These Care Teams include your primary Cardiologist (physician) and Advanced  Practice Providers (APPs -  Physician Assistants and Nurse Practitioners) who all work together to provide you with the care you need, when you need it.  We recommend signing up for the patient portal called "MyChart".  Sign up information is provided on this After Visit Summary.  MyChart is used to connect with patients for Virtual Visits (Telemedicine).  Patients are able to view lab/test results, encounter notes, upcoming appointments, etc.  Non-urgent messages can be sent to your provider as well.   To learn more about what you can do with MyChart, go to [email or web address].    Your next appointment:   6 month(s)  Provider:       Other Instructions Your physician has requested that you regularly monitor and record your blood pressure readings at home. Please use the same machine at the same time of day to check your readings and record them to bring to your follow-up visit.   Please monitor blood pressures and keep a log of your readings for 2 weeks then send in a mychart message with them.    Make sure to check 2 hours after your medications.    AVOID these things for 30 minutes before checking your blood pressure: No Drinking caffeine. No Drinking alcohol. No Eating. No Smoking. No Exercising.   Five minutes before checking your blood pressure: Pee. Sit in a dining chair. Avoid sitting in a soft couch or armchair. Be quiet. Do not talk

## 2023-06-25 NOTE — Progress Notes (Signed)
Cardiology Office Note:  .   Date:  06/25/2023  ID:  Joe Cantu, DOB Mar 23, 1951, MRN [id number] PCP: Estevan Oaks, NP  [facility] Providers Cardiologist:  Yates Decamp, MD {    History of Present Illness: Marland Kitchen   Joe Cantu is a 72 y.o. male with a past medical history of tobacco use, abnormal's EKG with frequent PVCs, aortic root dilation, no family history of sudden cardiac death, submassive bilateral DVT and PE on 08/30/2019, negative hypercoagulable workup (due to unprovoked bilateral lower extremity DVT and submassive PE was recommended to be on lifelong anticoagulation per hematology) here for follow-up appointment.  Had a IVC filter retrieved by Dr. Jacinto Halim back in June 2021.  Had not had any recurrence of DVT.  Main complaint is weight loss at the time of his last appointment in August of this year.  Mild chronic dyspnea that is unchanged.  No PND orthopnea.  No leg edema.  Today, he presents with a history of PVCs, DVT, and PE with ongoing weight loss and low energy. He reports a significant weight loss from 187-200 lbs to 138 lbs, which has stabilized recently. Despite eating three meals a day, albeit small portions, and supplementing with protein shakes and Boost, he continues to lose weight. He has seen a GI specialist, but no cause for the weight loss has been identified.  The patient also reports low energy, which he attributes to his metoprolol medication. He was prescribed metoprolol for PVCs, but he feels it may be contributing to his lack of energy. He is currently on the lowest dose, half a tablet.  In addition, the patient has a history of DVT and PE, for which he is on Xarelto. He reports no bleeding in his stool or urine. He also has a history of a low-normal heart pump function and a mitral valve condition, which was last evaluated with an echo in 2021.  Reports no shortness of breath nor dyspnea on exertion. Reports no chest pain, pressure, or tightness. No  edema, orthopnea, PND. Reports no palpitations.   Discussed the use of AI scribe software for clinical note transcription with the patient, who gave verbal consent to proceed.  ROS: Pertinent ROS in HPI  Studies Reviewed: .        CT angiogram chest 12/17/2020: 1. No evidence for acute pulmonary embolus. 2. Evidence of chronic pulmonary embolus with interval positive remodeling of the main, lobar and segmental pulmonary arteries. Compared with previous exam there has been significant improvement in the patency of the bilateral pulmonary arteries and their branches. No signs of acute pulmonary embolus. 3. Indeterminate nodular density within the superior segment of right lower lobe is new from the previous chest CT from 08/30/2019. Recommend follow-up examination in 3 months with repeat CT of the chest to assess for any temporal change in the appearance of this. 4. Mild cardiac enlargement. Reflux of contrast material into the IVC and hepatic veins identified which may be seen with right heart failure. The main pulmonary artery is mildly increased in caliber measuring 3.1 cm suggestive of pulmonary artery hypertension. 8.  Aortic atherosclerosis.  Abdominal aorta is widely patent.  All 3 mesenteric vessels are widely patent.   Echocardiogram 08/19/2021: Normal LV systolic function with visual EF 60-65%. Left ventricle cavity is normal in size. Normal left ventricular wall thickness. Normal global wall motion. Normal diastolic filling pattern, normal LAP. Native trileaflet aortic valve. Trace aortic regurgitation. Myxomatous degeneration with mild bileaflet prolapse. Moderate (Grade  II) mitral regurgitation. Moderate tricuspid regurgitation. No evidence of pulmonary hypertension, RVSP . The aortic root is normal in size (23mm Sinus of Valsalva).  Proximal ascending aorta not well visualized.  Compared to study 02/13/2021: Aortic root dilatation has resolved (41mm now 23mm), mild MR and  TR are now moderate.       Physical Exam:   VS:  BP (!) 92/58   Pulse (!) 57   Ht 6\' 3"  (1.905 m)   Wt 138 lb 3.2 oz (62.7 kg)   SpO2 100%   BMI 17.27 kg/m    Wt Readings from Last 3 Encounters:  06/25/23 138 lb 3.2 oz (62.7 kg)  02/11/23 132 lb 6.4 oz (60.1 kg)  08/13/22 139 lb 6.4 oz (63.2 kg)    GEN: Well nourished, well developed in no acute distress NECK: No JVD; No carotid bruits CARDIAC: RRR, no murmurs, rubs, gallops RESPIRATORY:  Clear to auscultation without rales, wheezing or rhonchi  ABDOMEN: Soft, non-tender, non-distended EXTREMITIES:  No edema; No deformity   ASSESSMENT AND PLAN: .       Unexplained Weight Loss Significant weight loss from 187-200 lbs to 138 lbs. GI workup has been unremarkable. Patient reports adequate caloric intake including protein shakes and Boost. -would recommend further follow-up with PCP -Continue current dietary regimen.  Hypotension Blood pressure today is 92/58, lower than previous reading of 105/66. Patient is on low dose Metoprolol for PVCs but reports feeling sluggish and low energy. -Discontinue Metoprolol. -Monitor blood pressure at home daily.  Premature Ventricular Contractions (PVCs) Patient was previously placed on Metoprolol for PVCs. However, due to hypotension and patient's reported sluggishness, Metoprolol is being discontinued. -Monitor for recurrence of PVC symptoms.  Deep Vein Thrombosis (DVT) and Pulmonary Embolism (PE) History Patient is on Xarelto for history of DVT and PE. No current symptoms of bleeding. -Continue Xarelto.  Mitral Valve Disease Last echocardiogram in 2021 showed low normal heart pump function and stable mitral valve. Given patient's reported sluggishness and upcoming discontinuation of Metoprolol, consideration for repeat echocardiogram. -Schedule echocardiogram in mid-January 2025. -Reevaluate need for echocardiogram based on patient's symptoms after discontinuation of  Metoprolol.   Dispo: He can follow-up in 6 months with Dr. Jacinto Halim  Signed, Sharlene Dory, PA-C

## 2023-07-21 ENCOUNTER — Telehealth: Payer: Self-pay

## 2023-07-21 DIAGNOSIS — Z79899 Other long term (current) drug therapy: Secondary | ICD-10-CM

## 2023-07-21 NOTE — Telephone Encounter (Unsigned)
   Pre-operative Risk Assessment    Patient Name: BART ASHFORD  DOB: 12/15/1950 MRN: [id number] {HEARTCARE STAFF-IMPORTANT INSTRUCTIONS 1 Red and Blue Text will auto delete once note is signed or closed. Do not manually delete brackets, colon or the number one. 2 Press F2 to navigate through template.   3 On drop down lists, L click to select >> R click to activate next field 4 Reason for Visit format is IMPORTANT!!  See Directions on No. 2 below. 5 Please review chart to determine if there is already a clearance note open for this procedure!!  DO NOT duplicate if a note already exists!!  6 Document below the last office encounter and the next scheduled encounter. This can be done through the appointment desk or with a chart review.   :1}  Date of last office visit: 12/13 Date of next office visit: *** {Is surgical clearance request for dental extraction/procedure?:[id number]} Request for Surgical Clearance {1. What type of surgery is being performed? Enter name of procedure below and number of teeth if dental extraction.  :1}   Procedure:  {Select one:21036050::***} {2. When is this surgery scheduled? Press F2 to enter date below and place date in Reason for Visit (see directions below). :1} Date of Surgery:  Clearance {Select MM/DD/YY or TBD      :[id number]}                              {For convenience, highlight and copy (CTL+C) the Clearance MM/DD/YY phrase above. Click here to go to Reason for Visit.  Paste (CTL+V) the date.  Engineer, Drilling.  Then click button underneath called Add Clearance MM/DD/YY as free text.     :1} {3. What is the name of the Surgeon, the Surgeon's Group or Practice, phone and fax number?  Press F2 and list below :1}  Surgeon:  *** Surgeon's Group or Practice Name:  *** Phone number:  *** Fax number:  *** {4. What type of clearance is requested?  Medical or Cardiac Clearance only?  Pharmacy Clearance Only (Request is to hold medication only)?  Or Both?  Press F2  and select the clearance requested.  If both are needed, select both from the drop down list.     :1}  Type of Clearance Requested:   {Select Medical, Pharmacy or Both Medical AND Pharmacy:[id number]} {5. What type of anesthesia will be used?  Press F2 and select the anesthesia to be used for the procedure.  :1}  Type of Anesthesia:  {Select Anesthesia to be Used:21036046} {6. Are there any other requests or questions from the surgeon?    :1}  Additional requests/questions:  {Select additional requests/questions or use asterisks to free text (Optional):21036049}  Signed, Ival LOISE Collet   07/21/2023, 3:00 PM

## 2023-07-21 NOTE — Telephone Encounter (Signed)
   Pre-operative Risk Assessment    Patient Name: Joe Cantu  DOB: 1950-09-22 MRN: [id number]   Date of last office visit: 06/25/23 Joe Fabry PA Date of next office visit: 12/28/23 Dr. Gordy Bergamo   Request for Surgical Clearance    Procedure:   EGD and Colonoscopy  Date of Surgery:  Clearance 08/03/23                                Surgeon:  Dr. Belvie Just Surgeon's Group or Practice Name:  [facility] Phone number:  [phone] Fax number:  [phone]   Type of Clearance Requested:   - Medical  - Pharmacy:  Hold Rivaroxaban  (Xarelto )     Type of Anesthesia:   Propofol    Additional requests/questions:    Joe Cantu   07/21/2023, 5:16 PM

## 2023-07-22 NOTE — Telephone Encounter (Signed)
 Patient with diagnosis of DVT/PE on Xarelto  for anticoagulation.    Procedure: EGD and Colonoscopy  Date of procedure: 08/03/23   CrCl needs labs Platelet count needs labs    **This guidance is not considered finalized until pre-operative APP has relayed final recommendations.**

## 2023-07-22 NOTE — Telephone Encounter (Signed)
 Orren,  You saw this patient on 06/25/2023. He is pending echo on 08/02/2023. Per office protocol, will you please comment on medical clearance for colonoscopy? Will he need echo prior to clearance?  Please route your response to P CV DIV Preop. I will communicate with requesting office once you have given recommendations.   Thank you!  Barnie Hila, NP

## 2023-07-22 NOTE — Telephone Encounter (Signed)
 Patient agreeable to coming in for labs. Orders in

## 2023-07-22 NOTE — Telephone Encounter (Signed)
 Pre-op team,   Patient needs CBC and BMP in order to get recommendations from pharmacy. Please place orders and inform patient to come in for labs.   Thank you!  Barnie HERO. Harel Repetto, DNP, NP-C  07/22/2023, 1:52 PM [facility] [address] Office [phone] Fax [phone]

## 2023-07-22 NOTE — Telephone Encounter (Signed)
 Please advise holding Xarelto prior to EGD and colonoscopy?   Thank you!  DW

## 2023-07-27 LAB — BASIC METABOLIC PANEL
BUN/Creatinine Ratio: 24 (ref 10–24)
BUN: 18 mg/dL (ref 8–27)
CO2: 23 mmol/L (ref 20–29)
Calcium: 11 mg/dL — ABNORMAL HIGH (ref 8.6–10.2)
Chloride: 104 mmol/L (ref 96–106)
Creatinine, Ser: 0.75 mg/dL — ABNORMAL LOW (ref 0.76–1.27)
Glucose: 78 mg/dL (ref 70–99)
Potassium: 4.3 mmol/L (ref 3.5–5.2)
Sodium: 139 mmol/L (ref 134–144)
eGFR: 97 mL/min/{1.73_m2} (ref >59)

## 2023-07-27 LAB — CBC
Hematocrit: 39.5 % (ref 37.5–51.0)
Hemoglobin: 12.9 g/dL — ABNORMAL LOW (ref 13.0–17.7)
MCH: 31.5 pg (ref 26.6–33.0)
MCHC: 32.7 g/dL (ref 31.5–35.7)
MCV: 97 fL (ref 79–97)
Platelets: 262 10*3/uL (ref 150–450)
RBC: 4.09 x10E6/uL — ABNORMAL LOW (ref 4.14–5.80)
RDW: 12.5 % (ref 11.6–15.4)
WBC: 6.2 10*3/uL (ref 3.4–10.8)

## 2023-07-27 NOTE — Telephone Encounter (Signed)
 Patient with diagnosis of DVT/PE on Xarelto  for anticoagulation.    Submassive bilateral DVT and PE 08/2019 - unprovoked, negative workup  Procedure: EGD and Colonoscopy  Date of procedure: 08/03/23  CrCl 87 Platelet count 262  Per office protocol, patient can hold Xarelto  for 2 days prior to procedure.   Patient will not need bridging with Lovenox (enoxaparin) around procedure.  **This guidance is not considered finalized until pre-operative APP has relayed final recommendations.**

## 2023-07-27 NOTE — Telephone Encounter (Signed)
   Patient Name: Joe Cantu  DOB: 1950/11/04 MRN: [id number]  Primary Cardiologist: Gordy Bergamo, MD  Chart reviewed as part of pre-operative protocol coverage. Given past medical history and time since last visit, based on ACC/AHA guidelines, Joe Cantu is at acceptable risk for the planned procedure without further cardiovascular testing.   Patient with diagnosis of DVT/PE on Xarelto  for anticoagulation.     Submassive bilateral DVT and PE 08/2019 - unprovoked, negative workup   Procedure: EGD and Colonoscopy  Date of procedure: 08/03/23   CrCl 87 Platelet count 262   Per office protocol, patient can hold Xarelto  for 2 days prior to procedure.   Patient will not need bridging with Lovenox (enoxaparin) around procedure.    The patient was advised that if he develops new symptoms prior to surgery to contact our office to arrange for a follow-up visit, and he verbalized understanding.  I will route this recommendation to the requesting party via Epic fax function and remove from pre-op pool.  Please call with questions.  Lamarr Satterfield, NP 07/27/2023, 4:19 PM

## 2023-08-02 ENCOUNTER — Ambulatory Visit (HOSPITAL_COMMUNITY): Payer: Medicare Other | Attending: Cardiology

## 2023-08-02 DIAGNOSIS — I493 Ventricular premature depolarization: Secondary | ICD-10-CM | POA: Insufficient documentation

## 2023-08-02 DIAGNOSIS — I340 Nonrheumatic mitral (valve) insufficiency: Secondary | ICD-10-CM | POA: Insufficient documentation

## 2023-08-02 DIAGNOSIS — I10 Essential (primary) hypertension: Secondary | ICD-10-CM | POA: Diagnosis present

## 2023-08-02 DIAGNOSIS — I77810 Thoracic aortic ectasia: Secondary | ICD-10-CM | POA: Diagnosis present

## 2023-08-02 DIAGNOSIS — R634 Abnormal weight loss: Secondary | ICD-10-CM | POA: Diagnosis present

## 2023-08-02 LAB — ECHOCARDIOGRAM COMPLETE
P 1/2 time: 726 ms
S' Lateral: 3.2 cm

## 2023-08-03 ENCOUNTER — Encounter: Payer: Self-pay | Admitting: Cardiology

## 2023-08-08 NOTE — Progress Notes (Signed)
Very stable echo compared to Echocardiogram 03/12/2023  No change in MR.   MR= Mitral regurgitation. (leakage on the left heart valve)  Similarly TR means tricuspid regurgitation (leakage on the right heart valve). Mild MR or TR are probably normal variants unless physicians feel it is abnormal. Reassure.  Normal heart function.  Aortic root mildly enlarged and stable

## 2023-08-17 LAB — HM COLONOSCOPY

## 2023-09-06 ENCOUNTER — Ambulatory Visit (INDEPENDENT_AMBULATORY_CARE_PROVIDER_SITE_OTHER): Payer: Medicare Other | Admitting: Podiatry

## 2023-09-06 ENCOUNTER — Encounter: Payer: Self-pay | Admitting: Podiatry

## 2023-09-06 DIAGNOSIS — D689 Coagulation defect, unspecified: Secondary | ICD-10-CM

## 2023-09-06 DIAGNOSIS — M79675 Pain in left toe(s): Secondary | ICD-10-CM

## 2023-09-06 DIAGNOSIS — B351 Tinea unguium: Secondary | ICD-10-CM

## 2023-09-06 DIAGNOSIS — M79674 Pain in right toe(s): Secondary | ICD-10-CM | POA: Diagnosis not present

## 2023-09-06 NOTE — Progress Notes (Signed)
  Subjective:  Patient ID: Joe Cantu, male    DOB: February 04, 1951,   MRN: [id number]  No chief complaint on file.   73 y.o. male presents for concern of thickened elongated and painful nails that are difficult to trim. Requesting to have them trimmed today. He is on blood thinner and at risk for foot care.   PCP:  Estevan Oaks, NP    . Denies any other pedal complaints. Denies n/v/f/c.   Past Medical History:  Diagnosis Date   Aortic root dilatation (HCC) 02/21/2019   Dyspnea on exertion    Frequent unifocal PVCs 02/21/2019   Glaucoma    Gonorrhea    18 & 19 yrs of age; negative for syphillis, herpes and HIV   Left inguinal hernia    Pneumonia    Presence of IVC filter 01/09/2020   Ventricular premature beats    Wears dentures     Objective:  Physical Exam: Vascular: DP/PT pulses 2/4 bilateral. CFT <3 seconds. Normal hair growth on digits. No edema.  Skin. No lacerations or abrasions bilateral feet. Nails 1-5 bilateral are thickened elongated and dystrophic.  Musculoskeletal: MMT 5/5 bilateral lower extremities in DF, PF, Inversion and Eversion. Deceased ROM in DF of ankle joint.  Neurological: Sensation intact to light touch.   Assessment:   1. Pain due to onychomycosis of toenails of both feet   2. Clotting disorder [facility])      Plan:  Patient was evaluated and treated and all questions answered. -Mechanically debrided all nails 1-5 bilateral using sterile nail nipper and filed with dremel without incident  -Answered all patient questions -Patient to return  in 3 months for at risk foot care -Patient advised to call the office if any problems or questions arise in the meantime.   Louann Sjogren, DPM

## 2023-12-08 ENCOUNTER — Ambulatory Visit (INDEPENDENT_AMBULATORY_CARE_PROVIDER_SITE_OTHER): Payer: Medicare Other | Admitting: Podiatry

## 2023-12-08 ENCOUNTER — Encounter: Payer: Self-pay | Admitting: Podiatry

## 2023-12-08 DIAGNOSIS — M79675 Pain in left toe(s): Secondary | ICD-10-CM

## 2023-12-08 DIAGNOSIS — M79674 Pain in right toe(s): Secondary | ICD-10-CM

## 2023-12-08 DIAGNOSIS — D689 Coagulation defect, unspecified: Secondary | ICD-10-CM | POA: Diagnosis not present

## 2023-12-08 DIAGNOSIS — B351 Tinea unguium: Secondary | ICD-10-CM

## 2023-12-08 NOTE — Progress Notes (Signed)
  Subjective:  Patient ID: Joe Cantu, male    DOB: Apr 25, 1951,   MRN: [id number]  Chief Complaint  Patient presents with   Nail Problem    Nail trim     73 y.o. male presents for concern of thickened elongated and painful nails that are difficult to trim. Requesting to have them trimmed today. He is on blood thinner and at risk for foot care.   PCP:  Carolyn Cisco, NP    . Denies any other pedal complaints. Denies n/v/f/c.   Past Medical History:  Diagnosis Date   Aortic root dilatation (HCC) 02/21/2019   Dyspnea on exertion    Frequent unifocal PVCs 02/21/2019   Glaucoma    Gonorrhea    18 & 19 yrs of age; negative for syphillis, herpes and HIV   Left inguinal hernia    Pneumonia    Presence of IVC filter 01/09/2020   Ventricular premature beats    Wears dentures     Objective:  Physical Exam: Vascular: DP/PT pulses 2/4 bilateral. CFT <3 seconds. Normal hair growth on digits. No edema.  Skin. No lacerations or abrasions bilateral feet. Nails 1-5 bilateral are thickened elongated and dystrophic.  Musculoskeletal: MMT 5/5 bilateral lower extremities in DF, PF, Inversion and Eversion. Deceased ROM in DF of ankle joint.  Neurological: Sensation intact to light touch.   Assessment:   1. Pain due to onychomycosis of toenails of both feet   2. Clotting disorder [facility])      Plan:  Patient was evaluated and treated and all questions answered. -Mechanically debrided all nails 1-5 bilateral using sterile nail nipper and filed with dremel without incident  -Answered all patient questions -Patient to return  in 3 months for at risk foot care -Patient advised to call the office if any problems or questions arise in the meantime.   Jennefer Moats, DPM

## 2023-12-28 ENCOUNTER — Encounter: Payer: Self-pay | Admitting: Cardiology

## 2023-12-28 ENCOUNTER — Encounter: Payer: Self-pay | Admitting: Nurse Practitioner

## 2023-12-28 ENCOUNTER — Ambulatory Visit: Payer: Medicare Other | Attending: Cardiology | Admitting: Cardiology

## 2023-12-28 ENCOUNTER — Other Ambulatory Visit: Payer: Self-pay | Admitting: Nurse Practitioner

## 2023-12-28 VITALS — BP 92/63 | HR 62 | Resp 18 | Ht 75.0 in | Wt 133.4 lb

## 2023-12-28 DIAGNOSIS — I77810 Thoracic aortic ectasia: Secondary | ICD-10-CM | POA: Diagnosis not present

## 2023-12-28 DIAGNOSIS — D6859 Other primary thrombophilia: Secondary | ICD-10-CM | POA: Diagnosis not present

## 2023-12-28 DIAGNOSIS — Z79899 Other long term (current) drug therapy: Secondary | ICD-10-CM

## 2023-12-28 DIAGNOSIS — I493 Ventricular premature depolarization: Secondary | ICD-10-CM

## 2023-12-28 DIAGNOSIS — J984 Other disorders of lung: Secondary | ICD-10-CM

## 2023-12-28 MED ORDER — RIVAROXABAN 10 MG PO TABS: 10.0000 mg | ORAL_TABLET | Freq: Every day | ORAL | 3 refills | Status: AC

## 2023-12-28 NOTE — Progress Notes (Signed)
 Cardiology Office Note:  .   Date:  12/28/2023  ID:  Joe Cantu, DOB 05-13-1951, MRN [id number] PCP: Carolyn Cisco, NP  [facility] Providers Cardiologist:  Knox Perl, MD   History of Present Illness: Joe Cantu   Joe Cantu is a 73 y.o.  male with a past medical history of tobacco use, abnormal's EKG with frequent PVCs, aortic root dilation, no family history of sudden cardiac death, submassive bilateral DVT and PE on 08/30/2019, negative hypercoagulable workup (due to unprovoked bilateral lower extremity DVT and submassive PE was recommended to be on lifelong anticoagulation per hematology)   Discussed the use of AI scribe software for clinical note transcription with the patient, who gave verbal consent to proceed.  History of Present Illness Joe Cantu is a 73 year old male who presents for cardiovascular evaluation. He is accompanied by his sister.  He has a history of spontaneous blood clots and is on Xarelto  20 mg once daily. He is unsure about the current management of his Xarelto  prescriptions. He used to have high blood pressure but is not on any antihypertensive medications. An echocardiogram in January showed a mildly dilated aorta at four centimeters. He has not experienced recent episodes of extra heartbeats, which were previously frequent.  Labs    Lab Results  Component Value Date   NA 139 07/26/2023   K 4.3 07/26/2023   CO2 23 07/26/2023   GLUCOSE 78 07/26/2023   BUN 18 07/26/2023   CREATININE 0.75 (L) 07/26/2023   CALCIUM 11.0 (H) 07/26/2023   EGFR 97 07/26/2023   GFRNONAA >60 01/16/2021      Latest Ref Rng & Units 07/26/2023    2:38 PM 01/16/2021    6:04 PM 12/17/2020   10:44 AM  BMP  Glucose 70 - 99 mg/dL 78  213  90   BUN 8 - 27 mg/dL 18  17  14    Creatinine 0.76 - 1.27 mg/dL 0.86  5.78  4.69   BUN/Creat Ratio 10 - 24 24     Sodium 134 - 144 mmol/L 139  142  138   Potassium 3.5 - 5.2 mmol/L 4.3  3.9  4.0   Chloride 96 - 106 mmol/L 104  114  109    CO2 20 - 29 mmol/L 23  20  22    Calcium 8.6 - 10.2 mg/dL 62.9  52.8  41.3       Latest Ref Rng & Units 07/26/2023    2:38 PM 01/16/2021    6:04 PM 12/17/2020   10:44 AM  CBC  WBC 3.4 - 10.8 x10E3/uL 6.2  6.9  4.4   Hemoglobin 13.0 - 17.7 g/dL 24.4  01.0  27.2   Hematocrit 37.5 - 51.0 % 39.5  39.9  39.8   Platelets 150 - 450 x10E3/uL 262  224  233    ROS  Review of Systems  Cardiovascular:  Negative for chest pain, dyspnea on exertion and leg swelling.    Physical Exam:   VS:  BP 92/63 (BP Location: Left Arm, Patient Position: Sitting, Cuff Size: Normal)   Pulse 62   Resp 18   Ht 6' 3 (1.905 m)   Wt 133 lb 6.4 oz (60.5 kg)   SpO2 98%   BMI 16.67 kg/m    Wt Readings from Last 3 Encounters:  12/28/23 133 lb 6.4 oz (60.5 kg)  06/25/23 138 lb 3.2 oz (62.7 kg)  02/11/23 132 lb 6.4 oz (60.1 kg)  Physical Exam Neck:     Vascular: No carotid bruit or JVD.   Cardiovascular:     Rate and Rhythm: Normal rate and regular rhythm.     Pulses: Intact distal pulses.     Heart sounds: Normal heart sounds. No murmur heard.    No gallop.  Pulmonary:     Effort: Pulmonary effort is normal.     Breath sounds: Normal breath sounds.  Abdominal:     General: Bowel sounds are normal.     Palpations: Abdomen is soft.   Musculoskeletal:     Right lower leg: No edema.     Left lower leg: No edema.    Studies Reviewed: Joe Cantu    ECHOCARDIOGRAM COMPLETE 08/02/2023  1. Left ventricular ejection fraction, by estimation, is 60 to 65%. The left ventricle has normal function. The left ventricle has no regional wall motion abnormalities. Left ventricular diastolic parameters were normal. 2. Right ventricular systolic function is low normal. The right ventricular size is normal. 3. Right atrial size was mildly dilated. 4. Very mild bileaflet prolapse with probable mitral annular dysjunction Consider MRI to further define MR is directed posterior in LA . Mild mitral valve regurgitation. 5. The  aortic valve is tricuspid. Aortic valve regurgitation is mild. 6. Aortic dilatation noted. There is mild dilatation of the aortic root, measuring 40 mm. There is mild dilatation of the ascending aorta, measuring 40 mm.  EKG:    EKG Interpretation Date/Time:  Tuesday December 28 2023 14:58:16 EDT Ventricular Rate:  59 PR Interval:  184 QRS Duration:  98 QT Interval:  394 QTC Calculation: 390 R Axis:   -63  Text Interpretation: EKG 12/28/2023: Normal sinus rhythm at rate of 59 bpm, left anterior fascicular block.  No evidence of ischemia.  Compared to 01/16/2021, frequent PVCs not present. Confirmed by Tyjae Shvartsman, Jagadeesh ([id number]) on 12/28/2023 9:09:44 PM    Medications ordered    Meds ordered this encounter  Medications   rivaroxaban  (XARELTO ) 10 MG TABS tablet    Sig: Take 1 tablet (10 mg total) by mouth daily with supper.    Dispense:  90 tablet    Refill:  3    Refills from [place], NP     ASSESSMENT AND PLAN: .      ICD-10-CM   1. Frequent unifocal PVCs  I49.3     2. Aortic root dilatation (HCC)  I77.810 EKG 12-Lead    3. Primary hypercoagulable state (HCC)  D68.59 rivaroxaban  (XARELTO ) 10 MG TABS tablet    4. On deep vein thrombosis (DVT) prophylaxis  Z79.899 rivaroxaban  (XARELTO ) 10 MG TABS tablet      Assessment and Plan Assessment & Plan Aortic dilation Aortic dilation at 4 cm, likely normal for his tall stature. Echocardiogram in January showed normal cardiac function. No current concerns regarding cardiac function or aortic dilation. - No further action required for aortic dilation at this time.  Primary hypercoagulable state On Xarelto  20 mg once daily for DVT prevention. Plan to reduce dose to 10 mg once daily to minimize bleeding risk. No recent hematology follow-up. Management to be transitioned to PCP. Cutting the 20 mg tablets in half is acceptable despite general recommendations against it. - Reduce Xarelto  dose to 10 mg once daily in the evening. - Instruct  to cut current 20 mg Xarelto  tablets in half until he is finished. - Send a new prescription for Xarelto  10 mg. - Ensure PCP [place], NP manages anticoagulation therapy going forward.  Patient has  been on long-term anticoagulation due to prior history of DVT leading to PE.  Now we can certainly use DVT prophylaxis dose to reduce risk of bleeding on a long-term basis.  Weight loss Weight stabilized around 132-133 lbs after previously increasing to 138 lbs. Appetite generally adequate, but caloric intake may be insufficient. No abdominal pain reported. Previous cancer workup negative. Not currently using protein supplements like Ensure. - Consider increasing caloric intake with nutrient-dense foods such as nuts, which are a good source of magnesium, calories, and healthy fats. - Consider using protein supplements like Ensure to increase caloric intake.  From cardiac standpoint he remains stable, he used to have frequent PVCs and palpitations which are now resolved, I will see him back on a as needed basis.   Signed,  Knox Perl, MD, [facility] 12/28/2023, 9:09 PM [facility] [address] [place], [place] [address] Phone: [phone]. Fax:  [phone]

## 2023-12-28 NOTE — Patient Instructions (Signed)
 Medication Instructions:  Change Xarelto  to 10 mg by mouth daily  *If you need a refill on your cardiac medications before your next appointment, please call your pharmacy*  Lab Work: none If you have labs (blood work) drawn today and your tests are completely normal, you will receive your results only by: MyChart Message (if you have MyChart) OR A paper copy in the mail If you have any lab test that is abnormal or we need to change your treatment, we will call you to review the results.  Testing/Procedures: none  Follow-Up: At [facility], you and your health needs are our priority.  As part of our continuing mission to provide you with exceptional heart care, our providers are all part of one team.  This team includes your primary Cardiologist (physician) and Advanced Practice Providers or APPs (Physician Assistants and Nurse Practitioners) who all work together to provide you with the care you need, when you need it.  Your next appointment:   As needed  Provider:   Knox Perl, MD    We recommend signing up for the patient portal called MyChart.  Sign up information is provided on this After Visit Summary.  MyChart is used to connect with patients for Virtual Visits (Telemedicine).  Patients are able to view lab/test results, encounter notes, upcoming appointments, etc.  Non-urgent messages can be sent to your provider as well.   To learn more about what you can do with MyChart, go to [email or web address].   Other Instructions

## 2024-01-10 ENCOUNTER — Ambulatory Visit
Admission: RE | Admit: 2024-01-10 | Discharge: 2024-01-10 | Disposition: A | Discharge status: Home / Self Care | Source: Ambulatory Visit | Attending: Nurse Practitioner | Admitting: Nurse Practitioner

## 2024-01-10 DIAGNOSIS — J984 Other disorders of lung: Secondary | ICD-10-CM

## 2024-01-10 MED ORDER — IOPAMIDOL (ISOVUE-370) INJECTION 76%
75.0000 mL | Freq: Once | INTRAVENOUS | Status: AC | PRN
  Administered 2024-01-10: 75 mL via INTRAVENOUS

## 2024-03-08 ENCOUNTER — Ambulatory Visit (INDEPENDENT_AMBULATORY_CARE_PROVIDER_SITE_OTHER): Admitting: Podiatry

## 2024-03-08 ENCOUNTER — Encounter: Payer: Self-pay | Admitting: Podiatry

## 2024-03-08 DIAGNOSIS — D689 Coagulation defect, unspecified: Secondary | ICD-10-CM | POA: Diagnosis not present

## 2024-03-08 DIAGNOSIS — M79674 Pain in right toe(s): Secondary | ICD-10-CM

## 2024-03-08 DIAGNOSIS — B351 Tinea unguium: Secondary | ICD-10-CM

## 2024-03-08 DIAGNOSIS — M79675 Pain in left toe(s): Secondary | ICD-10-CM | POA: Diagnosis not present

## 2024-03-08 NOTE — Progress Notes (Signed)
  Subjective:  Patient ID: Joe Cantu, male    DOB: 1951-04-18,   MRN: [id number]  Chief Complaint  Patient presents with   Nail Problem    She's supposed to be doing my toenails.    74 y.o. male presents for concern of thickened elongated and painful nails that are difficult to trim. Requesting to have them trimmed today. He is on blood thinner and at risk for foot care.   PCP:  Delores Rojelio Caldron, NP    . Denies any other pedal complaints. Denies n/v/f/c.   Past Medical History:  Diagnosis Date   Aortic root dilatation (HCC) 02/21/2019   Dyspnea on exertion    Frequent unifocal PVCs 02/21/2019   Glaucoma    Gonorrhea    18 & 19 yrs of age; negative for syphillis, herpes and HIV   Left inguinal hernia    Pneumonia    Presence of IVC filter 01/09/2020   Ventricular premature beats    Wears dentures     Objective:  Physical Exam: Vascular: DP/PT pulses 2/4 bilateral. CFT <3 seconds. Normal hair growth on digits. No edema.  Skin. No lacerations or abrasions bilateral feet. Nails 1-5 bilateral are thickened elongated and dystrophic.  Musculoskeletal: MMT 5/5 bilateral lower extremities in DF, PF, Inversion and Eversion. Deceased ROM in DF of ankle joint.  Neurological: Sensation intact to light touch.   Assessment:   1. Pain due to onychomycosis of toenails of both feet   2. Clotting disorder [facility])      Plan:  Patient was evaluated and treated and all questions answered. -Mechanically debrided all nails 1-5 bilateral using sterile nail nipper and filed with dremel without incident  -Answered all patient questions -Patient to return  in 3 months for at risk foot care -Patient advised to call the office if any problems or questions arise in the meantime.   Asberry Failing, DPM

## 2024-03-22 ENCOUNTER — Other Ambulatory Visit (HOSPITAL_BASED_OUTPATIENT_CLINIC_OR_DEPARTMENT_OTHER): Payer: Self-pay

## 2024-03-22 MED ORDER — FLUZONE HIGH-DOSE 0.5 ML IM SUSY
0.5000 mL | PREFILLED_SYRINGE | Freq: Once | INTRAMUSCULAR | 0 refills | Status: AC
  Filled 2024-03-22: qty 0.5, 1d supply, fill #0

## 2024-03-27 ENCOUNTER — Other Ambulatory Visit (HOSPITAL_BASED_OUTPATIENT_CLINIC_OR_DEPARTMENT_OTHER): Payer: Self-pay

## 2024-03-27 MED ORDER — COMIRNATY 30 MCG/0.3ML IM SUSY
0.3000 mL | PREFILLED_SYRINGE | Freq: Once | INTRAMUSCULAR | 0 refills | Status: AC
  Filled 2024-03-27: qty 0.3, 1d supply, fill #0

## 2024-06-07 ENCOUNTER — Ambulatory Visit (INDEPENDENT_AMBULATORY_CARE_PROVIDER_SITE_OTHER): Admitting: Podiatry

## 2024-06-07 ENCOUNTER — Encounter: Payer: Self-pay | Admitting: Podiatry

## 2024-06-07 DIAGNOSIS — B351 Tinea unguium: Secondary | ICD-10-CM | POA: Diagnosis not present

## 2024-06-07 DIAGNOSIS — M79675 Pain in left toe(s): Secondary | ICD-10-CM

## 2024-06-07 DIAGNOSIS — D689 Coagulation defect, unspecified: Secondary | ICD-10-CM

## 2024-06-07 DIAGNOSIS — M79674 Pain in right toe(s): Secondary | ICD-10-CM | POA: Diagnosis not present

## 2024-06-07 NOTE — Progress Notes (Signed)
  Subjective:  Patient ID: Joe Cantu, male    DOB: 1951/07/09,   MRN: [id number]  Chief Complaint  Patient presents with   RFC    RFC.  Not Diabetic. Xarelto     73 y.o. male presents for concern of thickened elongated and painful nails that are difficult to trim. Requesting to have them trimmed today. He is on blood thinner and at risk for foot care.   PCP:  Delores Rojelio Caldron, NP    . Denies any other pedal complaints. Denies n/v/f/c.   Past Medical History:  Diagnosis Date   Aortic root dilatation 02/21/2019   Dyspnea on exertion    Frequent unifocal PVCs 02/21/2019   Glaucoma    Gonorrhea    18 & 19 yrs of age; negative for syphillis, herpes and HIV   Left inguinal hernia    Pneumonia    Presence of IVC filter 01/09/2020   Ventricular premature beats    Wears dentures     Objective:  Physical Exam: Vascular: DP/PT pulses 2/4 bilateral. CFT <3 seconds. Normal hair growth on digits. No edema.  Skin. No lacerations or abrasions bilateral feet. Nails 1-5 bilateral are thickened elongated and dystrophic.  Musculoskeletal: MMT 5/5 bilateral lower extremities in DF, PF, Inversion and Eversion. Deceased ROM in DF of ankle joint.  Neurological: Sensation intact to light touch.   Assessment:   1. Pain due to onychomycosis of toenails of both feet   2. Clotting disorder      Plan:  Patient was evaluated and treated and all questions answered. -Mechanically debrided all nails 1-5 bilateral using sterile nail nipper and filed with dremel without incident  -Answered all patient questions -Patient to return  in 3 months for at risk foot care -Patient advised to call the office if any problems or questions arise in the meantime.   Asberry Failing, DPM

## 2024-07-12 ENCOUNTER — Other Ambulatory Visit (HOSPITAL_BASED_OUTPATIENT_CLINIC_OR_DEPARTMENT_OTHER): Payer: Self-pay

## 2024-09-13 ENCOUNTER — Ambulatory Visit: Admitting: Podiatry
